# Patient Record
Sex: Male | Born: 1965
Health system: Southern US, Community
[De-identification: ages and names within clinical notes are randomized; demographics above are authoritative.]

## PROBLEM LIST (undated history)

## (undated) DIAGNOSIS — E785 Hyperlipidemia, unspecified: Secondary | ICD-10-CM

## (undated) DIAGNOSIS — R7303 Prediabetes: Secondary | ICD-10-CM

## (undated) DIAGNOSIS — F1120 Opioid dependence, uncomplicated: Secondary | ICD-10-CM

## (undated) DIAGNOSIS — E119 Type 2 diabetes mellitus without complications: Secondary | ICD-10-CM

## (undated) DIAGNOSIS — F329 Major depressive disorder, single episode, unspecified: Secondary | ICD-10-CM

## (undated) DIAGNOSIS — F32A Depression, unspecified: Secondary | ICD-10-CM

## (undated) DIAGNOSIS — G4730 Sleep apnea, unspecified: Secondary | ICD-10-CM

## (undated) DIAGNOSIS — G8929 Other chronic pain: Secondary | ICD-10-CM

## (undated) DIAGNOSIS — F419 Anxiety disorder, unspecified: Secondary | ICD-10-CM

## (undated) HISTORY — DX: Hyperlipidemia, unspecified: E78.5

## (undated) HISTORY — DX: Type 2 diabetes mellitus without complications: E11.9

---

## 1993-07-30 HISTORY — PX: OTHER SURGICAL HISTORY: SHX169

## 2004-05-22 ENCOUNTER — Encounter (INDEPENDENT_AMBULATORY_CARE_PROVIDER_SITE_OTHER): Payer: Self-pay | Admitting: *Deleted

## 2004-05-22 ENCOUNTER — Ambulatory Visit (HOSPITAL_COMMUNITY): Admission: RE | Admit: 2004-05-22 | Discharge: 2004-05-22 | Payer: Self-pay | Admitting: Gastroenterology

## 2004-11-01 ENCOUNTER — Ambulatory Visit: Payer: Self-pay | Admitting: Family Medicine

## 2004-11-14 ENCOUNTER — Ambulatory Visit: Payer: Self-pay | Admitting: Family Medicine

## 2006-12-04 ENCOUNTER — Ambulatory Visit: Payer: Self-pay | Admitting: *Deleted

## 2006-12-04 DIAGNOSIS — M25569 Pain in unspecified knee: Secondary | ICD-10-CM | POA: Insufficient documentation

## 2006-12-04 DIAGNOSIS — E785 Hyperlipidemia, unspecified: Secondary | ICD-10-CM | POA: Insufficient documentation

## 2006-12-04 DIAGNOSIS — M109 Gout, unspecified: Secondary | ICD-10-CM | POA: Insufficient documentation

## 2006-12-04 DIAGNOSIS — G4700 Insomnia, unspecified: Secondary | ICD-10-CM | POA: Insufficient documentation

## 2006-12-17 ENCOUNTER — Telehealth (INDEPENDENT_AMBULATORY_CARE_PROVIDER_SITE_OTHER): Payer: Self-pay | Admitting: *Deleted

## 2007-09-04 ENCOUNTER — Encounter (INDEPENDENT_AMBULATORY_CARE_PROVIDER_SITE_OTHER): Payer: Self-pay | Admitting: Internal Medicine

## 2007-09-05 ENCOUNTER — Ambulatory Visit: Payer: Self-pay | Admitting: Family Medicine

## 2007-09-05 LAB — CONVERTED CEMR LAB: Blood Glucose, Fingerstick: 293

## 2007-09-09 LAB — CONVERTED CEMR LAB
ALT: 23 units/L (ref 0–53)
Albumin: 4.4 g/dL (ref 3.5–5.2)
Bilirubin, Direct: 0.2 mg/dL (ref 0.0–0.3)
Calcium: 9.7 mg/dL (ref 8.4–10.5)
Creatinine,U: 225.2 mg/dL
GFR calc Af Amer: 158 mL/min
GFR calc non Af Amer: 131 mL/min
Glucose, Bld: 293 mg/dL — ABNORMAL HIGH (ref 70–99)
Hgb A1c MFr Bld: 10.7 % — ABNORMAL HIGH (ref 4.6–6.0)
Microalb Creat Ratio: 7.5 mg/g (ref 0.0–30.0)
Sodium: 136 meq/L (ref 135–145)
Total CHOL/HDL Ratio: 6.4
Triglycerides: 522 mg/dL (ref 0–149)
VLDL: 104 mg/dL — ABNORMAL HIGH (ref 0–40)

## 2007-09-22 ENCOUNTER — Ambulatory Visit: Payer: Self-pay | Admitting: Family Medicine

## 2007-09-25 ENCOUNTER — Telehealth (INDEPENDENT_AMBULATORY_CARE_PROVIDER_SITE_OTHER): Payer: Self-pay | Admitting: Internal Medicine

## 2008-01-01 ENCOUNTER — Encounter (INDEPENDENT_AMBULATORY_CARE_PROVIDER_SITE_OTHER): Payer: Self-pay | Admitting: Internal Medicine

## 2008-07-12 ENCOUNTER — Telehealth (INDEPENDENT_AMBULATORY_CARE_PROVIDER_SITE_OTHER): Payer: Self-pay | Admitting: Internal Medicine

## 2008-10-28 ENCOUNTER — Encounter (INDEPENDENT_AMBULATORY_CARE_PROVIDER_SITE_OTHER): Payer: Self-pay | Admitting: Internal Medicine

## 2008-12-30 ENCOUNTER — Ambulatory Visit: Payer: Self-pay | Admitting: Family Medicine

## 2008-12-30 DIAGNOSIS — F17200 Nicotine dependence, unspecified, uncomplicated: Secondary | ICD-10-CM | POA: Insufficient documentation

## 2009-01-05 ENCOUNTER — Encounter (INDEPENDENT_AMBULATORY_CARE_PROVIDER_SITE_OTHER): Payer: Self-pay | Admitting: Internal Medicine

## 2009-02-03 ENCOUNTER — Ambulatory Visit: Payer: Self-pay | Admitting: Family Medicine

## 2009-02-04 LAB — CONVERTED CEMR LAB
Cholesterol: 216 mg/dL — ABNORMAL HIGH (ref 0–200)
Total CHOL/HDL Ratio: 5
Triglycerides: 339 mg/dL — ABNORMAL HIGH (ref 0.0–149.0)
VLDL: 67.8 mg/dL — ABNORMAL HIGH (ref 0.0–40.0)

## 2009-02-08 ENCOUNTER — Ambulatory Visit: Payer: Self-pay | Admitting: Family Medicine

## 2009-03-11 ENCOUNTER — Ambulatory Visit: Payer: Self-pay | Admitting: Family Medicine

## 2009-03-21 ENCOUNTER — Encounter: Payer: Self-pay | Admitting: Family Medicine

## 2009-03-21 LAB — CONVERTED CEMR LAB
ALT: 35 units/L (ref 0–53)
AST: 21 units/L (ref 0–37)
Hgb A1c MFr Bld: 8 % — ABNORMAL HIGH (ref 4.6–6.5)
Total CHOL/HDL Ratio: 5
VLDL: 56.4 mg/dL — ABNORMAL HIGH (ref 0.0–40.0)

## 2009-03-23 ENCOUNTER — Ambulatory Visit: Payer: Self-pay | Admitting: Family Medicine

## 2009-03-23 DIAGNOSIS — F4321 Adjustment disorder with depressed mood: Secondary | ICD-10-CM | POA: Insufficient documentation

## 2009-03-30 ENCOUNTER — Telehealth (INDEPENDENT_AMBULATORY_CARE_PROVIDER_SITE_OTHER): Payer: Self-pay | Admitting: Internal Medicine

## 2009-03-30 ENCOUNTER — Ambulatory Visit: Payer: Self-pay | Admitting: Family Medicine

## 2009-03-30 DIAGNOSIS — R5383 Other fatigue: Secondary | ICD-10-CM | POA: Insufficient documentation

## 2009-04-06 ENCOUNTER — Telehealth (INDEPENDENT_AMBULATORY_CARE_PROVIDER_SITE_OTHER): Payer: Self-pay | Admitting: Internal Medicine

## 2009-04-06 ENCOUNTER — Encounter (INDEPENDENT_AMBULATORY_CARE_PROVIDER_SITE_OTHER): Payer: Self-pay | Admitting: Internal Medicine

## 2009-04-06 LAB — CONVERTED CEMR LAB
BUN: 15 mg/dL (ref 6–23)
Basophils Absolute: 0 10*3/uL (ref 0.0–0.1)
Calcium: 9.8 mg/dL (ref 8.4–10.5)
GFR calc non Af Amer: 85.92 mL/min (ref >60)
Glucose, Bld: 175 mg/dL — ABNORMAL HIGH (ref 70–99)
HCT: 38.8 % — ABNORMAL LOW (ref 39.0–52.0)
Lymphocytes Relative: 26.3 % (ref 12.0–46.0)
Lymphs Abs: 2.1 10*3/uL (ref 0.7–4.0)
Monocytes Relative: 5.6 % (ref 3.0–12.0)
Platelets: 244 10*3/uL (ref 150.0–400.0)
RDW: 11.5 % (ref 11.5–14.6)
TSH: 0.26 microintl units/mL — ABNORMAL LOW (ref 0.35–5.50)

## 2009-04-07 ENCOUNTER — Telehealth (INDEPENDENT_AMBULATORY_CARE_PROVIDER_SITE_OTHER): Payer: Self-pay | Admitting: Internal Medicine

## 2009-04-12 ENCOUNTER — Encounter: Admission: RE | Admit: 2009-04-12 | Discharge: 2009-04-12 | Payer: Self-pay | Admitting: Family Medicine

## 2009-04-13 ENCOUNTER — Telehealth (INDEPENDENT_AMBULATORY_CARE_PROVIDER_SITE_OTHER): Payer: Self-pay | Admitting: Internal Medicine

## 2009-04-13 ENCOUNTER — Encounter (INDEPENDENT_AMBULATORY_CARE_PROVIDER_SITE_OTHER): Payer: Self-pay | Admitting: Internal Medicine

## 2009-04-26 ENCOUNTER — Ambulatory Visit: Payer: Self-pay | Admitting: Family Medicine

## 2009-05-05 ENCOUNTER — Telehealth (INDEPENDENT_AMBULATORY_CARE_PROVIDER_SITE_OTHER): Payer: Self-pay | Admitting: Internal Medicine

## 2009-05-06 LAB — CONVERTED CEMR LAB
ALT: 33 units/L (ref 0–53)
Total CHOL/HDL Ratio: 6
Triglycerides: 192 mg/dL — ABNORMAL HIGH (ref 0.0–149.0)

## 2009-05-26 ENCOUNTER — Ambulatory Visit: Payer: Self-pay | Admitting: Family Medicine

## 2009-05-26 DIAGNOSIS — E7800 Pure hypercholesterolemia, unspecified: Secondary | ICD-10-CM | POA: Insufficient documentation

## 2009-05-27 ENCOUNTER — Telehealth (INDEPENDENT_AMBULATORY_CARE_PROVIDER_SITE_OTHER): Payer: Self-pay | Admitting: Internal Medicine

## 2009-05-27 LAB — CONVERTED CEMR LAB
Cholesterol: 155 mg/dL (ref 0–200)
Direct LDL: 88.6 mg/dL
Total CHOL/HDL Ratio: 4
Triglycerides: 244 mg/dL — ABNORMAL HIGH (ref 0.0–149.0)

## 2009-06-03 ENCOUNTER — Ambulatory Visit: Payer: Self-pay | Admitting: Family Medicine

## 2009-06-27 ENCOUNTER — Telehealth: Payer: Self-pay | Admitting: Family Medicine

## 2009-07-18 ENCOUNTER — Ambulatory Visit: Payer: Self-pay | Admitting: Family Medicine

## 2009-07-20 ENCOUNTER — Telehealth (INDEPENDENT_AMBULATORY_CARE_PROVIDER_SITE_OTHER): Payer: Self-pay | Admitting: Internal Medicine

## 2009-07-20 LAB — CONVERTED CEMR LAB
Cholesterol: 175 mg/dL (ref 0–200)
HDL: 35.1 mg/dL — ABNORMAL LOW (ref >39.00)
TSH: 1.34 microintl units/mL (ref 0.35–5.50)
Triglycerides: 209 mg/dL — ABNORMAL HIGH (ref 0.0–149.0)

## 2009-07-25 ENCOUNTER — Telehealth: Payer: Self-pay | Admitting: Family Medicine

## 2009-07-27 ENCOUNTER — Ambulatory Visit: Payer: Self-pay | Admitting: Family Medicine

## 2009-08-29 ENCOUNTER — Ambulatory Visit: Payer: Self-pay | Admitting: Family Medicine

## 2009-08-29 LAB — CONVERTED CEMR LAB
ALT: 46 units/L (ref 0–53)
AST: 27 units/L (ref 0–37)
LDL Cholesterol: 81 mg/dL (ref 0–99)
Total CHOL/HDL Ratio: 4
VLDL: 36.8 mg/dL (ref 0.0–40.0)

## 2009-09-21 ENCOUNTER — Telehealth: Payer: Self-pay | Admitting: Family Medicine

## 2009-10-10 ENCOUNTER — Ambulatory Visit: Payer: Self-pay | Admitting: Family Medicine

## 2009-11-03 ENCOUNTER — Ambulatory Visit: Payer: Self-pay | Admitting: Family Medicine

## 2009-11-03 DIAGNOSIS — B002 Herpesviral gingivostomatitis and pharyngotonsillitis: Secondary | ICD-10-CM | POA: Insufficient documentation

## 2009-11-03 DIAGNOSIS — B009 Herpesviral infection, unspecified: Secondary | ICD-10-CM

## 2009-11-07 ENCOUNTER — Ambulatory Visit: Payer: Self-pay | Admitting: Family Medicine

## 2009-11-07 LAB — CONVERTED CEMR LAB
Basophils Absolute: 0.1 10*3/uL (ref 0.0–0.1)
Eosinophils Absolute: 0.3 10*3/uL (ref 0.0–0.7)
LH: 7.29 milliintl units/mL (ref 1.50–9.30)
Lymphocytes Relative: 22.1 % (ref 12.0–46.0)
MCHC: 34.5 g/dL (ref 30.0–36.0)
Monocytes Absolute: 0.5 10*3/uL (ref 0.1–1.0)
Neutrophils Relative %: 65.6 % (ref 43.0–77.0)
PSA: 0.55 ng/mL (ref 0.10–4.00)
RDW: 13.4 % (ref 11.5–14.6)

## 2009-12-05 ENCOUNTER — Ambulatory Visit: Payer: Self-pay | Admitting: Family Medicine

## 2009-12-07 ENCOUNTER — Telehealth: Payer: Self-pay | Admitting: Family Medicine

## 2009-12-21 ENCOUNTER — Telehealth: Payer: Self-pay | Admitting: Family Medicine

## 2009-12-29 ENCOUNTER — Telehealth: Payer: Self-pay | Admitting: Family Medicine

## 2010-01-03 ENCOUNTER — Telehealth: Payer: Self-pay | Admitting: Family Medicine

## 2010-01-17 ENCOUNTER — Ambulatory Visit: Payer: Self-pay | Admitting: Family Medicine

## 2010-01-17 DIAGNOSIS — E039 Hypothyroidism, unspecified: Secondary | ICD-10-CM | POA: Insufficient documentation

## 2010-01-17 DIAGNOSIS — E119 Type 2 diabetes mellitus without complications: Secondary | ICD-10-CM | POA: Insufficient documentation

## 2010-01-17 LAB — CONVERTED CEMR LAB: Microalb Creat Ratio: 0.6 mg/g (ref 0.0–30.0)

## 2010-01-18 LAB — CONVERTED CEMR LAB
ALT: 34 units/L (ref 0–53)
AST: 34 units/L (ref 0–37)
Basophils Absolute: 0 10*3/uL (ref 0.0–0.1)
Direct LDL: 89.2 mg/dL
Eosinophils Relative: 3.2 % (ref 0.0–5.0)
HCT: 39.5 % (ref 39.0–52.0)
Hemoglobin: 13.5 g/dL (ref 13.0–17.0)
Hgb A1c MFr Bld: 6.9 % — ABNORMAL HIGH (ref 4.6–6.5)
Lymphocytes Relative: 23.9 % (ref 12.0–46.0)
Lymphs Abs: 1.8 10*3/uL (ref 0.7–4.0)
Monocytes Relative: 6.2 % (ref 3.0–12.0)
Platelets: 258 10*3/uL (ref 150.0–400.0)
TSH: 0.58 microintl units/mL (ref 0.35–5.50)
Testosterone: 689.36 ng/dL (ref 350.00–890.00)
VLDL: 55.8 mg/dL — ABNORMAL HIGH (ref 0.0–40.0)
WBC: 7.3 10*3/uL (ref 4.5–10.5)

## 2010-01-25 ENCOUNTER — Ambulatory Visit: Payer: Self-pay | Admitting: Family Medicine

## 2010-01-31 ENCOUNTER — Telehealth: Payer: Self-pay | Admitting: Family Medicine

## 2010-02-15 ENCOUNTER — Telehealth: Payer: Self-pay | Admitting: Family Medicine

## 2010-02-20 ENCOUNTER — Telehealth: Payer: Self-pay | Admitting: Family Medicine

## 2010-02-20 ENCOUNTER — Ambulatory Visit: Payer: Self-pay | Admitting: Family Medicine

## 2010-02-20 DIAGNOSIS — M79609 Pain in unspecified limb: Secondary | ICD-10-CM | POA: Insufficient documentation

## 2010-02-21 ENCOUNTER — Encounter: Payer: Self-pay | Admitting: Family Medicine

## 2010-03-10 ENCOUNTER — Telehealth: Payer: Self-pay | Admitting: Family Medicine

## 2010-03-30 ENCOUNTER — Telehealth: Payer: Self-pay | Admitting: Family Medicine

## 2010-04-18 ENCOUNTER — Ambulatory Visit: Payer: Self-pay | Admitting: Family Medicine

## 2010-04-18 DIAGNOSIS — R0602 Shortness of breath: Secondary | ICD-10-CM | POA: Insufficient documentation

## 2010-04-19 ENCOUNTER — Telehealth: Payer: Self-pay | Admitting: Family Medicine

## 2010-04-20 ENCOUNTER — Encounter: Payer: Self-pay | Admitting: Family Medicine

## 2010-04-20 LAB — CONVERTED CEMR LAB
BUN: 16 mg/dL (ref 6–23)
Basophils Absolute: 0 10*3/uL (ref 0.0–0.1)
Basophils Relative: 0.8 % (ref 0.0–3.0)
Calcium: 9.7 mg/dL (ref 8.4–10.5)
Chloride: 102 meq/L (ref 96–112)
Creatinine, Ser: 0.9 mg/dL (ref 0.4–1.5)
Eosinophils Absolute: 0.2 10*3/uL (ref 0.0–0.7)
HCT: 35 % — ABNORMAL LOW (ref 39.0–52.0)
Hemoglobin: 12 g/dL — ABNORMAL LOW (ref 13.0–17.0)
Lymphs Abs: 1.7 10*3/uL (ref 0.7–4.0)
MCHC: 34.3 g/dL (ref 30.0–36.0)
MCV: 94.3 fL (ref 78.0–100.0)
Monocytes Absolute: 0.5 10*3/uL (ref 0.1–1.0)
Neutro Abs: 3.6 10*3/uL (ref 1.4–7.7)
RBC: 3.71 M/uL — ABNORMAL LOW (ref 4.22–5.81)
RDW: 13.6 % (ref 11.5–14.6)
TSH: 0.58 microintl units/mL (ref 0.35–5.50)

## 2010-04-24 ENCOUNTER — Ambulatory Visit: Payer: Self-pay | Admitting: Family Medicine

## 2010-04-25 LAB — CONVERTED CEMR LAB
BUN: 18 mg/dL (ref 6–23)
Basophils Relative: 1 % (ref 0.0–3.0)
CO2: 30 meq/L (ref 19–32)
Chloride: 104 meq/L (ref 96–112)
Cholesterol: 156 mg/dL (ref 0–200)
Creatinine, Ser: 0.8 mg/dL (ref 0.4–1.5)
Eosinophils Absolute: 0.2 10*3/uL (ref 0.0–0.7)
Eosinophils Relative: 2.9 % (ref 0.0–5.0)
HCT: 37.7 % — ABNORMAL LOW (ref 39.0–52.0)
Lymphs Abs: 1.5 10*3/uL (ref 0.7–4.0)
MCHC: 34.1 g/dL (ref 30.0–36.0)
MCV: 94.8 fL (ref 78.0–100.0)
Monocytes Absolute: 0.4 10*3/uL (ref 0.1–1.0)
Platelets: 235 10*3/uL (ref 150.0–400.0)
Potassium: 5 meq/L (ref 3.5–5.1)
RBC: 3.97 M/uL — ABNORMAL LOW (ref 4.22–5.81)
VLDL: 70.2 mg/dL — ABNORMAL HIGH (ref 0.0–40.0)
WBC: 5.7 10*3/uL (ref 4.5–10.5)

## 2010-05-01 ENCOUNTER — Ambulatory Visit: Payer: Self-pay | Admitting: Family Medicine

## 2010-05-08 ENCOUNTER — Telehealth: Payer: Self-pay | Admitting: Family Medicine

## 2010-05-16 ENCOUNTER — Telehealth: Payer: Self-pay | Admitting: Family Medicine

## 2010-05-25 ENCOUNTER — Telehealth: Payer: Self-pay | Admitting: Family Medicine

## 2010-06-08 ENCOUNTER — Telehealth: Payer: Self-pay | Admitting: Family Medicine

## 2010-06-13 ENCOUNTER — Telehealth: Payer: Self-pay | Admitting: Family Medicine

## 2010-06-14 ENCOUNTER — Telehealth: Payer: Self-pay | Admitting: Family Medicine

## 2010-06-16 ENCOUNTER — Telehealth: Payer: Self-pay | Admitting: Family Medicine

## 2010-06-16 ENCOUNTER — Ambulatory Visit: Payer: Self-pay | Admitting: Family Medicine

## 2010-06-19 LAB — CONVERTED CEMR LAB
Alkaline Phosphatase: 37 units/L — ABNORMAL LOW (ref 39–117)
Bilirubin, Direct: 0.1 mg/dL (ref 0.0–0.3)
LDL Cholesterol: 71 mg/dL (ref 0–99)
Total Bilirubin: 0.8 mg/dL (ref 0.3–1.2)
VLDL: 38.4 mg/dL (ref 0.0–40.0)

## 2010-06-26 ENCOUNTER — Ambulatory Visit: Payer: Self-pay | Admitting: Family Medicine

## 2010-06-28 ENCOUNTER — Telehealth: Payer: Self-pay | Admitting: Family Medicine

## 2010-06-29 ENCOUNTER — Telehealth: Payer: Self-pay | Admitting: Family Medicine

## 2010-07-11 ENCOUNTER — Telehealth: Payer: Self-pay | Admitting: Family Medicine

## 2010-07-19 ENCOUNTER — Telehealth: Payer: Self-pay | Admitting: Family Medicine

## 2010-07-26 ENCOUNTER — Ambulatory Visit: Payer: Self-pay | Admitting: Family Medicine

## 2010-07-26 DIAGNOSIS — J069 Acute upper respiratory infection, unspecified: Secondary | ICD-10-CM | POA: Insufficient documentation

## 2010-07-26 DIAGNOSIS — F411 Generalized anxiety disorder: Secondary | ICD-10-CM | POA: Insufficient documentation

## 2010-08-07 ENCOUNTER — Telehealth: Payer: Self-pay | Admitting: Family Medicine

## 2010-08-11 ENCOUNTER — Telehealth: Payer: Self-pay | Admitting: Family Medicine

## 2010-08-11 ENCOUNTER — Ambulatory Visit
Admission: RE | Admit: 2010-08-11 | Discharge: 2010-08-11 | Payer: Self-pay | Source: Home / Self Care | Attending: Family Medicine | Admitting: Family Medicine

## 2010-08-11 DIAGNOSIS — H9209 Otalgia, unspecified ear: Secondary | ICD-10-CM | POA: Insufficient documentation

## 2010-08-29 NOTE — Progress Notes (Signed)
Summary: Valcyclovir  Phone Note Refill Request Message from:  [employer] on Dec 07, 2009 9:29 AM  Refills Requested: Medication #1:  VALTREX 1 GM TABS 2 tabs by mouth two times a day x 1 days [facility] [id number]   Last Fill Date:  No date sent   Pharmacy Phone:  [phone]   Method Requested: Electronic Initial call taken by: Delilah Shan CMA Duncan Dull),  Dec 07, 2009 9:29 AM    Prescriptions: VALTREX 1 GM TABS (VALACYCLOVIR HCL) 2 tabs by mouth two times a day x 1 days  #20 x 0   Entered and Authorized by:   Ruthe Mannan MD   Signed by:   Ruthe Mannan MD on 12/07/2009   Method used:   Electronically to        [facility] (retail)       [address]       [place], [place]  [address]       Ph: [phone] or [phone]       Fax: [phone]   RxID:   [id number]

## 2010-08-29 NOTE — Assessment & Plan Note (Signed)
Summary: Discuss med options /lsf   Vital Signs:  Patient profile:   45 year old male Height:      70 inches Weight:      208 pounds BMI:     29.95 Temp:     99 degrees F oral Pulse rate:   84 / minute Pulse rhythm:   regular BP sitting:   138 / 88  (left arm) Cuff size:   large  Vitals Entered By: Delilah Shan CMA Duncan Dull) (November 07, 2009 8:16 AM) CC: Discuss med options   History of Present Illness: 45 yo here to discuss low testosterone.  Saw him last week for worsening symptoms of depression. Started having symptoms of depression after he was laid off in 2009. Was placed on Prozac, titrated up to 30 mg last year, then Wellbutrin 300 mg qam was added in November 2010.  Last week, we decided to wean off of prozac and start Cymbalta 60 mg daily in the hopes that maybe it would help with his chronic pain.  We also checked his total testosterone levels because of lack of sex drive.   Has not noticed any decrease in size of his testicles. No h/o prostate CA.  No peripheral vision abnormalities. Total testosterone 215!   Current Medications (verified): 1)  Vitamin C 500 Mg  Tabs (Ascorbic Acid) .... One Every Other Day 2)  Vitamin B-12 500 Mcg  Tabs (Cyanocobalamin) .... One Every Other Day 3)  Multivitamins   Tabs (Multiple Vitamin) .... One Every Otherday 4)  Aspirin 81 Mg  Tabs (Aspirin) .... One Daily 5)  Mineral Complex  Caps (Multiple Minerals) .... One Every Other Day 6)  Potassium 99 Mg Tabs (Potassium) .... One Every Other Day 7)  Magnesium Oxide 500 Mg Tabs (Magnesium Oxide) .... One Daily 8)  Fish Oil   Oil (Fish Oil) .... One Every Other Day 9)  L-Tyrosine 500 Mg Caps (Tyrosine) .... One Every Other Day 10)  Calcium 1250 Mg Tabs (Calcium Carbonate) .... One Every Other Day 11)  Vitamin B Complex-C   Caps (B Complex-C) .... One Every Other Day 12)  Actoplus Met 15-850 Mg Tabs (Pioglitazone Hcl-Metformin Hcl) .... Take 1 One  Times A Day 13)  Lipitor 40 Mg Tabs  (Atorvastatin Calcium) .Marland Kitchen.. 1 Once Daily For Cholesterol 14)  Fenofibrate Micronized 134 Mg Caps (Fenofibrate Micronized) .Marland Kitchen.. 1 Once Daily For Triglycerides 15)  Lipitor 40 Mg Tabs (Atorvastatin Calcium) .Marland Kitchen.. 1 Daily For Cholesterol 16)  Wellbutrin Sr 150 Mg Xr12h-Tab (Bupropion Hcl) .... 2 Tab By Mouth in The Morning, 1 Tab By Mouth Qhs 17)  Ferrous Sulfate 325 (65 Fe) Mg Tabs (Ferrous Sulfate) .... Take 1 Tablet By Mouth Once A Day 18)  Cymbalta 60 Mg Cpep (Duloxetine Hcl) .... Take One Capsule Daily 19)  Valtrex 1 Gm Tabs (Valacyclovir Hcl) .... 2 Tabs By Mouth Two Times A Day X 1 Days 20)  Androgel Pump 1 % Gel (Testosterone) .... 5 G Every Morning Applied To Clean, Dry, Intact Skin.  Allergies: 1)  ! Lopid  Review of Systems      See HPI  Physical Exam  General:  alert, well-developed, well-nourished, and well-hydrated.   Psych:  normally interactive, good eye contact, not anxious appearing, and not depressed appearing.     Impression & Recommendations:  Problem # 1:  TESTICULAR HYPOFUNCTION (ICD-257.2) Assessment New Time spent with patient 25 minutes, more than 50% of this time was spent counseling patient on causes and treatment of  low testosterone. His testosterone is very low for his age.  Would like to truly figure out that cause.  May be multifactorial-long term use of SSRIs and Tramadol at high doses, obesity (BMI 30), and diabetes that has been poorly controlled at times. Will check FSH, LH to distinguish between primary and secondary hypogonadism. Start testosterone supplementation today- Adrogel 5 g daily. Check baseline CBC, PSA today. Follow up with me in 3 months.  Orders: Venipuncture ([address]) TLB-CBC Platelet - w/Differential (85025-CBCD) TLB-FSH (Follicle Stimulating Hormone) (83001-FSH) TLB-Luteinizing Hormone (LH) (83002-LH) TLB-PSA (Prostate Specific Antigen) (84153-PSA)  Complete Medication List: 1)  Vitamin C 500 Mg Tabs (Ascorbic acid) .... One  every other day 2)  Vitamin B-12 500 Mcg Tabs (Cyanocobalamin) .... One every other day 3)  Multivitamins Tabs (Multiple vitamin) .... One every otherday 4)  Aspirin 81 Mg Tabs (Aspirin) .... One daily 5)  Mineral Complex Caps (Multiple minerals) .... One every other day 6)  Potassium 99 Mg Tabs (Potassium) .... One every other day 7)  Magnesium Oxide 500 Mg Tabs (Magnesium oxide) .... One daily 8)  Fish Oil Oil (Fish oil) .... One every other day 9)  L-tyrosine 500 Mg Caps (Tyrosine) .... One every other day 10)  Calcium 1250 Mg Tabs (Calcium carbonate) .... One every other day 11)  Vitamin B Complex-c Caps (B complex-c) .... One every other day 12)  Actoplus Met 15-850 Mg Tabs (Pioglitazone hcl-metformin hcl) .... Take 1 one  times a day 13)  Lipitor 40 Mg Tabs (Atorvastatin calcium) .Marland Kitchen.. 1 once daily for cholesterol 14)  Fenofibrate Micronized 134 Mg Caps (Fenofibrate micronized) .Marland Kitchen.. 1 once daily for triglycerides 15)  Lipitor 40 Mg Tabs (Atorvastatin calcium) .Marland Kitchen.. 1 daily for cholesterol 16)  Wellbutrin Sr 150 Mg Xr12h-tab (Bupropion hcl) .... 2 tab by mouth in the morning, 1 tab by mouth qhs 17)  Ferrous Sulfate 325 (65 Fe) Mg Tabs (Ferrous sulfate) .... Take 1 tablet by mouth once a day 18)  Cymbalta 60 Mg Cpep (Duloxetine hcl) .... Take one capsule daily 19)  Valtrex 1 Gm Tabs (Valacyclovir hcl) .... 2 tabs by mouth two times a day x 1 days 20)  Androgel Pump 1 % Gel (Testosterone) .... 5 g every morning applied to clean, dry, intact skin.  Patient Instructions: 1)  Please come back to see me in 3 months. Prescriptions: ANDROGEL PUMP 1 % GEL (TESTOSTERONE) 5 g every morning applied to clean, dry, intact skin.  #1 bottle x 6   Entered and Authorized by:   Ruthe Mannan MD   Signed by:   Ruthe Mannan MD on 11/07/2009   Method used:   Print then Give to Patient   RxID:   [id number]   Current Allergies (reviewed today): ! LOPID

## 2010-08-29 NOTE — Assessment & Plan Note (Signed)
Summary: SHORTNESS OF BREATH/JRR   Vital Signs:  Patient profile:   45 year old male Height:      70 inches Weight:      206 pounds BMI:     29.66 O2 Sat:      100 % on Room air Temp:     98.4 degrees F oral Pulse rate:   72 / minute Pulse rhythm:   regular BP sitting:   110 / 70  (right arm) Cuff size:   regular  Vitals Entered By: Linde Gillis CMA Duncan Dull) (April 18, 2010 10:38 AM)  O2 Flow:  Room air CC: shortness of breath   History of Present Illness: 45 yo here for SOB.  Started about 3 weeks ago.  No cough, wheezing, URI symptoms. Has noticed that he will fall asleep and then wake up needing to take a deep breath.  Sits in the recliner to fall back alseep.  No LE edema or cardiac history.   has always been someone who snores.  Has had h/o hypothyroidism, testicular hypfunction on Androderm. Has had issues with anxiety and depression in past but lately he feels he has been doing ok. Always has stress, but not more than usual.    Occassionaly SOB in the middle of day but mostly at night. No CP, no DOE.  Current Medications (verified): 1)  Multivitamins   Tabs (Multiple Vitamin) .... One Every Otherday 2)  Aspirin 81 Mg  Tabs (Aspirin) .... One Daily 3)  Magnesium Oxide 500 Mg Tabs (Magnesium Oxide) .... One Daily 4)  Fish Oil   Oil (Fish Oil) .... One Every Other Day 5)  Actoplus Met 15-850 Mg Tabs (Pioglitazone Hcl-Metformin Hcl) .... Take 1 One  Times A Day 6)  Lipitor 40 Mg Tabs (Atorvastatin Calcium) .Marland Kitchen.. 1 Once Daily For Cholesterol 7)  Fenofibrate Micronized 134 Mg Caps (Fenofibrate Micronized) .Marland Kitchen.. 1 Once Daily For Triglycerides 8)  Ferrous Sulfate 325 (65 Fe) Mg Tabs (Ferrous Sulfate) .... Take 1 Tablet By Mouth Once A Day 9)  Vicodin Es 7.5-750 Mg Tabs (Hydrocodone-Acetaminophen) .Marland Kitchen.. 1-2 Tabs By Mouth Two Times A Day As Needed Pain 10)  Androderm 2.5 Mg/24hr Pt24 (Testosterone) .Marland Kitchen.. 1 Patch Applied Daily. 11)  Calcium 600 Mg Tabs (Calcium) .... Take One  Tablet By Mouth Two Times A Day 12)  Valacyclovir Hcl 1 Gm Tabs (Valacyclovir Hcl) .... Take 2 Tablets By Mouth Two Times A Day X 1 Day 13)  Colchicine 0.6 Mg Tabs (Colchicine) .... 2 Tabs Po At  First Sign of Flare, Followed in 1 Hour With A Single Dose 1 Tab Po 14)  Alprazolam 0.25 Mg Tabs (Alprazolam) .Marland Kitchen.. 1-2 Tab By Mouth Three Times A Day As Needed Anxiey or Shortness of Breath  Allergies: 1)  ! Lopid  Past History:  Past Medical History: Last updated: 12/04/2006 Hyperlipidemia  Past Surgical History: Last updated: 12/04/2006 Removal of bone spurs from both elbows- 1995  Family History: Last updated: 09/05/2007 Family History of Cardiovascular disorder Both parents still living and also 3 siblings as well. 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 Eyes:  Denies blurring. CV:  Denies chest pain or discomfort. Resp:  Complains of shortness of breath; denies cough, sputum productive, and wheezing. Psych:  Complains of anxiety; denies mental problems, panic attacks, sense of great danger, suicidal thoughts/plans, thoughts of violence, unusual visions or sounds, and thoughts /plans of harming others.  Physical Exam  General:  alert, well-developed, well-nourished, and well-hydrated.   Lungs:  Normal respiratory effort, chest expands symmetrically. Lungs are clear to auscultation, no crackles or wheezes. Heart:  Normal rate and regular rhythm. S1 and S2 normal without gallop, murmur, click, rub or other extra sounds. Extremities:  no edema Neurologic:  alert & oriented X3 and  gait normal.   Skin:  Intact without suspicious lesions or rashes Psych:  slightly anxious, otherwise normal affect.Oriented X3 and normally interactive.     Impression & Recommendations:  Problem # 1:  SHORTNESS OF BREATH (ICD-786.05) Assessment New Unknown etiology.  Likely multifactorial, combination of anxiety and ?sleep apnea. GIven his history of anxiety and depression, will give as needed Alprazolam to see if helps with symptoms but given nocturnal complaints and wanting to sleep in a recliner, a sleep study is very appropriate. Also will check CBC, TSH given h/o testosterone use and hypothyroidism.  Complete Medication List: 1)  Multivitamins Tabs (Multiple vitamin) .... One every otherday 2)  Aspirin 81 Mg Tabs (Aspirin) .... One daily 3)  Magnesium Oxide 500 Mg Tabs (Magnesium oxide) .... One daily 4)  Fish Oil Oil (Fish oil) .... One every other day 5)  Actoplus Met 15-850 Mg Tabs (Pioglitazone hcl-metformin hcl) .... Take 1 one  times a day 6)  Lipitor 40 Mg Tabs (Atorvastatin calcium) .Marland Kitchen.. 1 once daily for cholesterol 7)  Fenofibrate Micronized 134 Mg Caps (Fenofibrate micronized) .Marland Kitchen.. 1 once daily for triglycerides 8)  Ferrous Sulfate 325 (65 Fe) Mg Tabs (Ferrous sulfate) .... Take 1 tablet by mouth once a day 9)  Vicodin Es 7.5-750 Mg Tabs (Hydrocodone-acetaminophen) .Marland Kitchen.. 1-2 tabs by mouth two times a day as needed pain 10)  Androderm 2.5 Mg/24hr Pt24 (Testosterone) .Marland Kitchen.. 1 patch applied daily. 11)  Calcium 600 Mg Tabs (Calcium) .... Take one tablet by mouth two times a day 12)  Valacyclovir Hcl 1 Gm Tabs (Valacyclovir hcl) .... Take 2 tablets by mouth two times a day x 1 day 13)  Colchicine 0.6 Mg Tabs (Colchicine) .... 2 tabs po at  first sign of flare, followed in 1 hour with a single dose 1 tab po 14)  Alprazolam 0.25 Mg Tabs (Alprazolam) .Marland Kitchen.. 1-2 tab by mouth three times a day as needed anxiey or shortness of breath  Other Orders: Venipuncture (29562) TLB-CBC  Platelet - w/Differential (85025-CBCD) TLB-BMP (Basic Metabolic Panel-BMET) (80048-METABOL) TLB-TSH (Thyroid Stimulating Hormone) (84443-TSH) Sleep Disorder Referral (Sleep Disorder)  Patient Instructions: 1)  Please stop by to see Aram Beecham on your way out to set up your sleep study. Prescriptions: ALPRAZOLAM 0.25 MG TABS (ALPRAZOLAM) 1-2 tab by mouth three times a day as needed anxiey or shortness of breath  #60 x 0   Entered and Authorized by:   Ruthe Mannan MD   Signed by:   Ruthe Mannan MD on 04/18/2010   Method used:   Print then Give to Patient   RxID:   [id number]   Current Allergies (reviewed today): ! LOPID

## 2010-08-29 NOTE — Progress Notes (Signed)
Summary: Rx Hydrocodone/APAP  Phone Note Refill Request Call back at [phone] Message [facility]:  [facility] on January 31, 2010 10:12 AM  Refills Requested: Medication #1:  VICODIN ES 7.5-750 MG TABS 1-2 tabs by mouth two times a day as needed pain   Last Refilled: 01/11/2010 Received faxed refill request please advise.   Method Requested: Telephone to Pharmacy Initial call taken by: Linde Gillis CMA Duncan Dull),  January 31, 2010 10:12 AM  Follow-up for Phone Call        Rx called to pharmacy Follow-up by: Linde Gillis CMA Duncan Dull),  January 31, 2010 10:35 AM    Prescriptions: VICODIN ES 7.5-750 MG TABS (HYDROCODONE-ACETAMINOPHEN) 1-2 tabs by mouth two times a day as needed pain  #90 x 0   Entered and Authorized by:   Ruthe Mannan MD   Signed by:   Ruthe Mannan MD on 01/31/2010   Method used:   Telephoned to ...       [facility] [address] ([facility]ail)       [address]       [place], [place]  [address]       Ph: [phone] or [phone]       Fax: [phone]   RxID:   [id number]

## 2010-08-29 NOTE — Progress Notes (Signed)
Summary: Rx Colcrys  Phone Note Refill Request Call back at [phone] Message from:  [facility] on March 10, 2010 10:57 AM  Refills Requested: Medication #1:  COLCHICINE 0.6 MG TABS 2 tabs po at  first sign of flare Received E-script request please advise.   Method Requested: Electronic Initial call taken by: Linde Gillis CMA Duncan Dull),  March 10, 2010 10:58 AM    Prescriptions: COLCHICINE 0.6 MG TABS (COLCHICINE) 2 tabs po at  first sign of flare, followed in 1 hour with a single dose 1 tab po  #30 x 3   Entered and Authorized by:   Ruthe Mannan MD   Signed by:   Ruthe Mannan MD on 03/10/2010   Method used:   Electronically to        [facility] (retail)       [address]       [place], [place]  [address]       Ph: [phone] or [phone]       Fax: [phone]   RxID:   [id number]

## 2010-08-29 NOTE — Assessment & Plan Note (Signed)
Summary: 8 WK F/U DLO   Vital Signs:  Patient profile:   45 year old male Height:      70 inches Weight:      204 pounds BMI:     29.38 Temp:     98.5 degrees F oral Pulse rate:   72 / minute Pulse rhythm:   regular BP sitting:   144 / 82  (left arm) Cuff size:   regular  Vitals Entered By: Linde Gillis CMA Duncan Dull) (June 26, 2010 3:39 PM) CC: 8 week follow up after labs   History of Present Illness: 45 yo here to follow up cholesterol, diabetes.    HLD- mcuh improved with Crestor 10 mg daily.  TG now 192 (was 351), LDL stable at 81 ( was 74.6)    Compliant with medications and denies any side effects.  DM- a1c stable at 7.0.  Does not check his CBGs regularly.   On Actoplus Met 15-850.    Chronic pain- well controlled on Vicodin, does not abuse his pain contract.  Brings his bottle in today that says Vicodin 5-500- 1 tab by mouth three times a day  as needed pain instead of 1-2 tab three times a day as needed pain.  Allergies: 1)  ! Lopid  Review of Systems      See HPI General:  Denies malaise. CV:  Denies chest pain or discomfort. Resp:  Denies shortness of breath. MS:  Denies muscle aches, muscle weakness, and stiffness.  Physical Exam  General:  alert, well-developed, well-nourished, and well-hydrated.   Mouth:  MMM Lungs:  Normal respiratory effort, chest expands symmetrically. Lungs are clear to auscultation, no crackles or wheezes. Heart:  Normal rate and regular rhythm. S1 and S2 normal without gallop, murmur, click, rub or other extra sounds. Psych:  slightly anxious, otherwise normal affect.Oriented X3 and normally interactive.     Impression & Recommendations:  Problem # 1:  DIABETES-TYPE 2 (ICD-250.00) Assessment Deteriorated Discussed labs with him.  He will work on diet, no change in meds. Recheck a1c in 3 months. His updated medication list for this problem includes:    Aspirin 81 Mg Tabs (Aspirin) ..... One daily    Actoplus Met 15-850 Mg  Tabs (Pioglitazone hcl-metformin hcl) .Marland Kitchen... Take 1 one  times a day  Problem # 2:  PURE HYPERCHOLESTEROLEMIA (ICD-272.0) Assessment: Improved Continue Crestor and Fenofibrate.  No apparent side effects. His updated medication list for this problem includes:    Fenofibrate Micronized 134 Mg Caps (Fenofibrate micronized) .Marland Kitchen... 1 once daily for triglycerides    Crestor 10 Mg Tabs (Rosuvastatin calcium) .Marland Kitchen... 1 tablet by mouth daily  Complete Medication List: 1)  Multivitamins Tabs (Multiple vitamin) .... One every otherday 2)  Aspirin 81 Mg Tabs (Aspirin) .... One daily 3)  Magnesium Oxide 500 Mg Tabs (Magnesium oxide) .... One daily 4)  Fish Oil Oil (Fish oil) .... One every other day 5)  Actoplus Met 15-850 Mg Tabs (Pioglitazone hcl-metformin hcl) .... Take 1 one  times a day 6)  Fenofibrate Micronized 134 Mg Caps (Fenofibrate micronized) .Marland Kitchen.. 1 once daily for triglycerides 7)  Ferrous Sulfate 325 (65 Fe) Mg Tabs (Ferrous sulfate) .... Take 1 tablet by mouth once a day 8)  Vicodin 5-500 Mg Tabs (Hydrocodone-acetaminophen) .Marland Kitchen.. 1-2  tab by mouth three times a day as needed pain 9)  Androderm 2.5 Mg/24hr Pt24 (Testosterone) .Marland Kitchen.. 1 patch applied daily. 10)  Calcium 600 Mg Tabs (Calcium) .... Take one tablet by mouth two times  a day 11)  Valacyclovir Hcl 1 Gm Tabs (Valacyclovir hcl) .... Take 2 tablets by mouth two times a day x 1 day 12)  Colchicine 0.6 Mg Tabs (Colchicine) .... 2 tabs po at  first sign of flare, followed in 1 hour with a single dose 1 tab po 13)  Alprazolam 0.25 Mg Tabs (Alprazolam) .Marland Kitchen.. 1-2 tab by mouth three times a day as needed anxiey or shortness of breath 14)  Crestor 10 Mg Tabs (Rosuvastatin calcium) .Marland Kitchen.. 1 tablet by mouth daily  Patient Instructions: 1)  Have a wonderful holiday. 2)  Make a lab appointment to check you a1c in 3 months (250.00) Prescriptions: VICODIN 5-500 MG TABS (HYDROCODONE-ACETAMINOPHEN) 1-2  tab by mouth three times a day as needed pain  #280 x  0   Entered and Authorized by:   Ruthe Mannan MD   Signed by:   Ruthe Mannan MD on 06/26/2010   Method used:   Print then Give to Patient   RxID:   [id number] VICODIN 5-500 MG TABS (HYDROCODONE-ACETAMINOPHEN) 1-2  tab by mouth three times a day as needed pain  #180 x 0   Entered and Authorized by:   Ruthe Mannan MD   Signed by:   Ruthe Mannan MD on 06/26/2010   Method used:   Print then Give to Patient   RxID:   [id number]    Orders Added: 1)  Est. Patient Level IV [84696]    Current Allergies (reviewed today): ! LOPID

## 2010-08-29 NOTE — Progress Notes (Signed)
Summary: refill request for vicodin  Phone Note Refill Request Message from:  Fax from Pharmacy  Refills Requested: Medication #1:  VICODIN ES 7.5-750 MG TABS 1-2 tabs by mouth two times a day as needed pain   Last Refilled: 05/01/2010 [facility] [address], [phone].  Request is for 5/500, dose in chart is 7.5/750.  Initial call taken by: Lowella Petties CMA,  May 16, 2010 4:59 PM  Follow-up for Phone Call        Rx called to pharmacy Follow-up by: Linde Gillis CMA Duncan Dull),  May 17, 2010 9:04 AM    Prescriptions: VICODIN ES 7.5-750 MG TABS (HYDROCODONE-ACETAMINOPHEN) 1-2 tabs by mouth two times a day as needed pain  #120 x 0   Entered and Authorized by:   Ruthe Mannan MD   Signed by:   Ruthe Mannan MD on 05/17/2010   Method used:   Telephoned to ...       [facility]ail)       [address]       [place], [place]  [address]       Ph: [phone] or [phone]       Fax: [phone]   RxID:   [id number]

## 2010-08-29 NOTE — Assessment & Plan Note (Signed)
Summary: ? increase Prozac.  Appt. to Est in June/lsf   Vital Signs:  Patient profile:   45 year old male Height:      70 inches Weight:      209.8 pounds BMI:     30.21 Temp:     98.7 degrees F oral Pulse rate:   80 / minute Pulse rhythm:   regular BP sitting:   120 / 84  (left arm) Cuff size:   large  Vitals Entered By: Benny Lennert CMA Duncan Dull) (October 10, 2009 8:18 AM)  History of Present Illness: 45 yo here to discuss worsening symptoms of depression. Started having symptoms of depression after he was laid off in 2009. 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 Has a 59 yo son and a daughter at [place], feels a lot of pressure to take care of them financially.  laced on Prozac, titrated up to 40 mg last year, then Wellbutrin 300 mg qam was added in November 2010. Symptoms of tearfulness and hopelessness got a little better but over past few months, feels worsening anhedonia.  No SI or HI. Titrated up his Wellbutrin- taking 600 mg a day now!  Having difficulty sleeping, feeling anxious. No panic attacks. Lack of interest in sexual intercourse. Has chronic pain from work injuries and arthritis.  Takes 100 mg of tramadol every 6 hours, helps with the pain but still having breakthrough pain.  Current Medications (verified): 1)  Vitamin C 500 Mg  Tabs (Ascorbic Acid) .... One Every Other Day 2)  Vitamin B-12 500 Mcg  Tabs (Cyanocobalamin) .... One Every Other Day 3)  Multivitamins   Tabs (Multiple Vitamin) .... One Every Otherday 4)  Trilipix 135 Mg Cpdr (Choline Fenofibrate) .Marland Kitchen.. 1 Once Daily For Elevated Triglycerides 5)  Aspirin 81 Mg  Tabs (Aspirin) .... One Daily 6)  Mineral Complex  Caps (Multiple Minerals) .... One Every Other Day 7)  Potassium 99 Mg Tabs (Potassium) .... One Every Other Day 8)  Magnesium Oxide 500 Mg Tabs (Magnesium Oxide) .... One Daily 9)  Fish Oil   Oil (Fish Oil) .... One Every Other Day 10)  L-Tyrosine 500 Mg  Caps (Tyrosine) .... One Every Other Day 11)  Calcium 1250 Mg Tabs (Calcium Carbonate) .... One Every Other Day 12)  Vitamin B Complex-C   Caps (B Complex-C) .... One Every Other Day 13)  Actoplus Met 15-850 Mg Tabs (Pioglitazone Hcl-Metformin Hcl) .... Take 1 One  Times A Day 14)  Prozac 20 Mg Caps (Fluoxetine Hcl) .... 3 Tab By Mouth Daily. 15)  Lipitor 40 Mg Tabs (Atorvastatin Calcium) .Marland Kitchen.. 1 Once Daily For Cholesterol 16)  Ferrous Sulfate 325 (65 Fe) Mg  Tabs (Ferrous Sulfate) .... One Daily 17)  Tramadol Hcl 50 Mg Tabs (Tramadol Hcl) .... Take 2 Every 6 Hrs As Needed Pain 18)  Fenofibrate Micronized 134 Mg Caps (Fenofibrate Micronized) .Marland Kitchen.. 1 Once Daily For Triglycerides 19)  Lipitor 40 Mg Tabs (Atorvastatin Calcium) .Marland Kitchen.. 1 Daily For Cholesterol 20)  Lipitor 80 Mg Tabs (Atorvastatin Calcium) .... Take 1 Once Daily For Cholesterol 21)  Wellbutrin Sr 150 Mg Xr12h-Tab (Bupropion Hcl) .... Take 2 Each Morning For Depression 22)  Wellbutrin Sr 150 Mg Xr12h-Tab (Bupropion Hcl) .... 2 Tab By Mouth in The Morning, 1 Tab By Mouth Qhs 23)  Meloxicam 15 Mg Tabs (Meloxicam) .... One By Mouth Daily  Allergies: 1)  ! Lopid  Review of Systems  See HPI Psych:  Complains of anxiety, depression, and easily tearful; denies easily angered, irritability, mental problems, panic attacks, sense of great danger, suicidal thoughts/plans, thoughts of violence, unusual visions or sounds, and thoughts /plans of harming others.  Physical Exam  General:  alert, well-developed, well-nourished, and well-hydrated.   Psych:  normally interactive, good eye contact, not anxious appearing, and not depressed appearing.     Impression & Recommendations:  Problem # 1:  DEPRESSION, SITUATIONAL (ICD-309.0) Assessment Deteriorated Time spent with patient 25 minutes, more than 50% of this time was spent counseling patient on depression/anxiety.  Taking too much Wellbutrin, will change to 300 mg qam and 150 mg at  bedtime. Will increase Prozac to 60 mg but explained that it may worsen his sexual dysfuntion.  He is willing to take that risk at this time.  He is not interseted in changing to other classes of drug.  Follow up with me in one month.  Problem # 2:  KNEE PAIN, LEFT (ICD-719.46) Assessment: Deteriorated with other chronic pain.  Will add Meloxicam 15 mg daily to Tramadol. His updated medication list for this problem includes:    Aspirin 81 Mg Tabs (Aspirin) ..... One daily    Tramadol Hcl 50 Mg Tabs (Tramadol hcl) .Marland Kitchen... Take 2 every 6 hrs as needed pain    Meloxicam 15 Mg Tabs (Meloxicam) ..... One by mouth daily  Complete Medication List: 1)  Vitamin C 500 Mg Tabs (Ascorbic acid) .... One every other day 2)  Vitamin B-12 500 Mcg Tabs (Cyanocobalamin) .... One every other day 3)  Multivitamins Tabs (Multiple vitamin) .... One every otherday 4)  Trilipix 135 Mg Cpdr (Choline fenofibrate) .Marland Kitchen.. 1 once daily for elevated triglycerides 5)  Aspirin 81 Mg Tabs (Aspirin) .... One daily 6)  Mineral Complex Caps (Multiple minerals) .... One every other day 7)  Potassium 99 Mg Tabs (Potassium) .... One every other day 8)  Magnesium Oxide 500 Mg Tabs (Magnesium oxide) .... One daily 9)  Fish Oil Oil (Fish oil) .... One every other day 10)  L-tyrosine 500 Mg Caps (Tyrosine) .... One every other day 11)  Calcium 1250 Mg Tabs (Calcium carbonate) .... One every other day 12)  Vitamin B Complex-c Caps (B complex-c) .... One every other day 13)  Actoplus Met 15-850 Mg Tabs (Pioglitazone hcl-metformin hcl) .... Take 1 one  times a day 14)  Prozac 20 Mg Caps (Fluoxetine hcl) .... 3 tab by mouth daily. 15)  Lipitor 40 Mg Tabs (Atorvastatin calcium) .Marland Kitchen.. 1 once daily for cholesterol 16)  Ferrous Sulfate 325 (65 Fe) Mg Tabs (Ferrous sulfate) .... One daily 17)  Tramadol Hcl 50 Mg Tabs (Tramadol hcl) .... Take 2 every 6 hrs as needed pain 18)  Fenofibrate Micronized 134 Mg Caps (Fenofibrate micronized) .Marland Kitchen.. 1  once daily for triglycerides 19)  Lipitor 40 Mg Tabs (Atorvastatin calcium) .Marland Kitchen.. 1 daily for cholesterol 20)  Lipitor 80 Mg Tabs (Atorvastatin calcium) .... Take 1 once daily for cholesterol 21)  Wellbutrin Sr 150 Mg Xr12h-tab (Bupropion hcl) .... Take 2 each morning for depression 22)  Wellbutrin Sr 150 Mg Xr12h-tab (Bupropion hcl) .... 2 tab by mouth in the morning, 1 tab by mouth qhs 23)  Meloxicam 15 Mg Tabs (Meloxicam) .... One by mouth daily  Patient Instructions: 1)  It was nice to meet you, Mr. Niazi. 2)  Lets add Meloxicam 15 mg daily to your Tramadol (I called in a new prescripiton for you). 3)  We will increase your  Prozac to 60 mg daily (three 20 mg tablets) and your Wellbutrin to two 150 mg tablets in the morning and one 150 mg tablet at night. 4)  Please come back to see me in one month. Prescriptions: TRAMADOL HCL 50 MG TABS (TRAMADOL HCL) take 2 every 6 hrs as needed pain  #120 x 2   Entered and Authorized by:   Ruthe Mannan MD   Signed by:   Ruthe Mannan MD on 10/10/2009   Method used:   [facility]        [facility] [facility])       [address]       [place], [place]  [address]       Ph: [phone] or [phone]       Fax: [phone]   RxID:   [id number] MELOXICAM 15 MG TABS (MELOXICAM) one by mouth daily  #30 x 0   Entered and Authorized by:   Ruthe Mannan MD   Signed by:   Ruthe Mannan MD on 10/10/2009   Method used:   Electron[facility]        [facility] (retail)       [address]       [place], [place]  [address]       Ph: [phone] or [phone]       Fax: [phone]   RxID:   [id number] PROZAC 20 MG CAPS (FLUOXETINE HCL) 3 tab by mouth daily.  #90 x 3   Entered and Authorized by:   Ruthe Mannan MD   Signed by:   Ruthe Mannan MD on 10/10/2009   Method used:   [facility]ron[facility]        [facility] (retail)       [address]       [place], [place]  [address]       Ph: [phone] or  [phone]       Fax: [phone]   RxID:   [id number] WELLBUTRIN SR 150 MG XR12H-TAB (BUPROPION HCL) 2 tab by mouth in the morning, 1 tab by mouth qhs  #90 x 3   Entered and Authorized by:   Ruthe Mannan MD   Signed by:   Ruthe Mannan MD on 10/10/2009   Method used:   [facility]ron[facility]        [facility] [address] ([facility]ail)       [address]       [place], [place]  [address]       Ph: [phone] or [phone]       Fax: [phone]   RxID:   [id number]   Current Allergies (reviewed today): ! LOPID

## 2010-08-29 NOTE — Progress Notes (Signed)
Summary: Rx Alprazolam  Phone Note Refill Request Call back at [phone] Message from:  [facility] on June 08, 2010 12:20 PM  Refills Requested: Medication #1:  ALPRAZOLAM 0.25 MG TABS 1-2 tab by mouth three times a day as needed anxiey or shortness of breath   Last Refilled: 05/25/2010 Received faxed refill request please advise.   Method Requested: Telephone to Pharmacy Initial call taken by: Linde Gillis CMA Duncan Dull),  June 08, 2010 12:20 PM  Follow-up for Phone Call        Rx called to pharmacy Follow-up by: Linde Gillis CMA Duncan Dull),  June 08, 2010 2:26 PM    Prescriptions: ALPRAZOLAM 0.25 MG TABS (ALPRAZOLAM) 1-2 tab by mouth three times a day as needed anxiey or shortness of breath  #60 x 0   Entered and Authorized by:   Ruthe Mannan MD   Signed by:   Ruthe Mannan MD on 06/08/2010   Method used:   Telephoned to ...       [facility] (retail)       [address]       [place], [place]  [address]       Ph: [phone] or [phone]       Fax: [phone]   RxID:   [id number]

## 2010-08-29 NOTE — Progress Notes (Signed)
Summary: refill request for alprazolam  Phone Note Refill Request Message from:  Fax from Pharmacy  Refills Requested: Medication #1:  ALPRAZOLAM 0.25 MG TABS 1-2 tab by mouth three times a day as needed anxiey or shortness of breath   Last Refilled: 06/08/2010 [facility] [address], [phone].  Initial call taken by: Lowella Petties CMA, AAMA,  June 28, 2010 8:48 AM  Follow-up for Phone Call        No refill until 12/10.Marland Kitchen last prescription given 11/10. Follow-up by: Kerby Nora MD,  June 28, 2010 8:55 AM  Additional Follow-up for Phone Call Additional follow up Details #1::        Left message on voicemail at pharmacy advising as instructed. Additional Follow-up by: Linde Gillis CMA Duncan Dull),  June 28, 2010 9:25 AM

## 2010-08-29 NOTE — Progress Notes (Signed)
Summary: refill request for xanax  Phone Note Refill Request Call back at [phone] Message from:  Patient  Refills Requested: Medication #1:  ALPRAZOLAM 0.25 MG TABS 1-2 tab by mouth three times a day as needed anxiey or shortness of breath Pt's request for a refill was denied because last refill was on 11/10.  He states he only gets 10 days worth at a time, [facility].  Initial call taken by: Lowella Petties CMA, AAMA,  June 29, 2010 10:36 AM  Follow-up for Phone Call        Denied, follow-up with PCP to review usage pattern.   Recommend controlled substance database querry.  #60 06/08/2010 - by written rx, this is 30 day supply.  not negotiable. Hannah Beat MD  June 29, 2010 10:49 AM   Additional Follow-up for Phone Call Additional follow up Details #1::        Dr. Milinda Antis, please review and advise on whether pt can have refill before Dr. Dayton Martes gets back.  The patient says he takes 3 a day.  If he got 60 on 11/10 that would be a 20 day supply, and it has been 20 days.       Lowella Petties CMA, AAMA  June 29, 2010 11:04 AM  can go ahead and refil  px written on EMR for call in  Additional Follow-up by: Judith Part MD,  June 29, 2010 12:49 PM    Additional Follow-up for Phone Call Additional follow up Details #2::    [facility]             Lowella Petties CMA, AAMA  June 29, 2010 12:54 PM   Prescriptions: ALPRAZOLAM 0.25 MG TABS (ALPRAZOLAM) 1-2 tab by mouth three times a day as needed anxiey or shortness of breath  #60 x 0   Entered and Authorized by:   Judith Part MD   Signed by:   Lowella Petties CMA, AAMA on 06/29/2010   Method used:   Telephoned to ...       [facility] (retail)       [address]       [place], [place]  [address]       Ph: [phone] or [phone]       Fax: [phone]   RxID:   [id number]

## 2010-08-29 NOTE — Letter (Signed)
Summary: Out of Work  [facility]  [address] [place], [place] [address]   Phone: [phone]  Fax: [phone]    February 21, 2010   Employee:  DELAWRENCE FRIDMAN    To Whom It May Concern:   For Medical reasons, please excuse the above named employee from work for the following dates:  Start:    End:    If you need additional information, please feel free to contact our office.         Sincerely,    Ruthe Mannan MD

## 2010-08-29 NOTE — Progress Notes (Signed)
Summary: alprazolam   Phone Note Refill Request Message from:  Fax from Pharmacy on May 25, 2010 9:44 AM  Refills Requested: Medication #1:  ALPRAZOLAM 0.25 MG TABS 1-2 tab by mouth three times a day as needed anxiey or shortness of breath   Last Refilled: 05/08/2010 [facility] [phone].   Initial call taken by: Melody Comas,  May 25, 2010 9:45 AM  Follow-up for Phone Call        Rx called to pharmacy Follow-up by: Linde Gillis CMA Duncan Dull),  May 25, 2010 10:05 AM    Prescriptions: ALPRAZOLAM 0.25 MG TABS (ALPRAZOLAM) 1-2 tab by mouth three times a day as needed anxiey or shortness of breath  #60 x 0   Entered and Authorized by:   Ruthe Mannan MD   Signed by:   Ruthe Mannan MD on 05/25/2010   Method used:   Telephoned to ...       [facility] (retail)       [address]       [place], [place]  [address]       Ph: [phone] or [phone]       Fax: [phone]   RxID:   [id number]

## 2010-08-29 NOTE — Progress Notes (Signed)
Summary: refill request for andoderm  Phone Note Refill Request Message from:  Fax from Pharmacy  Refills Requested: Medication #1:  ANDRODERM 2.5 MG/24HR PT24 1 patch applied daily..   Last Refilled: 12/05/2009 [facility]axed request from [facility] [address], [phone].  Initial call taken by: Lowella Petties CMA,  December 29, 2009 1:25 PM  Follow-up for Phone Call        Rx called to pharmacy Follow-up by: Linde Gillis CMA Duncan Dull),  December 29, 2009 2:23 PM    Prescriptions: ANDRODERM 2.5 MG/24HR PT24 (TESTOSTERONE) 1 patch applied daily.  #30 x 0   Entered and Authorized by:   Ruthe Mannan MD   Signed by:   Ruthe Mannan MD on 12/29/2009   Method used:   Telephoned to ...       [facility] (retail)       [address]       [place], [place]  [address]       Ph: [phone] or [phone]       Fax: [phone]   RxID:   [id number]

## 2010-08-29 NOTE — Assessment & Plan Note (Signed)
Summary: 3 MONTH FOLLOW UP/[facility]   Vital Signs:  Patient profile:   45 year old male Height:      70 inches Weight:      196 pounds BMI:     28.22 Temp:     98.7 degrees F oral Pulse rate:   72 / minute Pulse rhythm:   regular BP sitting:   112 / 80  (left arm) Cuff size:   regular  Vitals Entered By: Linde Gillis CMA Duncan Dull) (May 01, 2010 3:29 PM) CC: 3 month follow up, labs given to patient   History of Present Illness: 45 yo here to follow up cholesterol.    HLD- deteriorated.  TG jumped to 351, LDL stable at 74.6, HDL 33. Taking Fenofibrate 134 mg daily, Lipitor 40 mg daily.  Compliant with medications.  DM- Fasting CBG elevated as well.  Reports that his CBGs have been great, ranging in 100-120s.  On Actoplus Met 15-850.    Current Medications (verified): 1)  Multivitamins   Tabs (Multiple Vitamin) .... One Every Otherday 2)  Aspirin 81 Mg  Tabs (Aspirin) .... One Daily 3)  Magnesium Oxide 500 Mg Tabs (Magnesium Oxide) .... One Daily 4)  Fish Oil   Oil (Fish Oil) .... One Every Other Day 5)  Actoplus Met 15-850 Mg Tabs (Pioglitazone Hcl-Metformin Hcl) .... Take 1 One  Times A Day 6)  Fenofibrate Micronized 134 Mg Caps (Fenofibrate Micronized) .Marland Kitchen.. 1 Once Daily For Triglycerides 7)  Ferrous Sulfate 325 (65 Fe) Mg Tabs (Ferrous Sulfate) .... Take 1 Tablet By Mouth Once A Day 8)  Vicodin Es 7.5-750 Mg Tabs (Hydrocodone-Acetaminophen) .Marland Kitchen.. 1-2 Tabs By Mouth Two Times A Day As Needed Pain 9)  Androderm 2.5 Mg/24hr Pt24 (Testosterone) .Marland Kitchen.. 1 Patch Applied Daily. 10)  Calcium 600 Mg Tabs (Calcium) .... Take One Tablet By Mouth Two Times A Day 11)  Valacyclovir Hcl 1 Gm Tabs (Valacyclovir Hcl) .... Take 2 Tablets By Mouth Two Times A Day X 1 Day 12)  Colchicine 0.6 Mg Tabs (Colchicine) .... 2 Tabs Po At  First Sign of Flare, Followed in 1 Hour With A Single Dose 1 Tab Po 13)  Alprazolam 0.25 Mg Tabs (Alprazolam) .Marland Kitchen.. 1-2 Tab By Mouth Three Times A Day As Needed Anxiey or  Shortness of Breath 14)  Crestor 10 Mg Tabs (Rosuvastatin Calcium) .Marland Kitchen.. 1 Tablet By Mouth Daily  Allergies: 1)  ! Lopid  Past History:  Past Medical History: Last updated: 12/04/2006 Hyperlipidemia  Past Surgical History: Last updated: 12/04/2006 Removal of bone spurs from both elbows- 1995  Family History: Last updated: 09/05/2007 Family History of Cardiovascular disorder Both parents still living and also 3 siblings as well. 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 CV:  Denies chest pain or discomfort. GI:  Denies nausea and vomiting.  Physical Exam  General:  alert, well-developed, well-nourished, and well-hydrated.  10 pound weight loss! Mouth:  MMM Lungs:  Normal respiratory effort, chest expands symmetrically. Lungs are clear to auscultation, no crackles or wheezes. Heart:  Normal rate and regular rhythm. S1 and  S2 normal without gallop, murmur, click, rub or other extra sounds. Skin:  Intact without suspicious lesions or rashes Psych:  slightly anxious, otherwise normal affect.Oriented X3 and normally interactive.     Impression & Recommendations:  Problem # 1:  PURE HYPERCHOLESTEROLEMIA (ICD-272.0) Assessment Deteriorated D/c lipitor, start crestor to hopefully improve HDL and TG.  Part of elevated TG likely due to deteriorated DM.  Recheck lipid panel in 8 weeks.  Congratulated him on weight loss.   The following medications were removed from the medication list:    Lipitor 40 Mg Tabs (Atorvastatin calcium) .Marland Kitchen... 1 once daily for cholesterol His updated medication list for this  problem includes:    Fenofibrate Micronized 134 Mg Caps (Fenofibrate micronized) .Marland Kitchen... 1 once daily for triglycerides    Crestor 10 Mg Tabs (Rosuvastatin calcium) .Marland Kitchen... 1 tablet by mouth daily  Problem # 2:  DIABETES-TYPE 2 (ICD-250.00) Assessment: Deteriorated Reheck a1c in 8 weeks when we check above labs. Discussed decreasing added sugars, eliminate trans fats, increase fiber and limit alcohol.   His updated medication list for this problem includes:    Aspirin 81 Mg Tabs (Aspirin) ..... One daily    Actoplus Met 15-850 Mg Tabs (Pioglitazone hcl-metformin hcl) .Marland Kitchen... Take 1 one  times a day  Complete Medication List: 1)  Multivitamins Tabs (Multiple vitamin) .... One every otherday 2)  Aspirin 81 Mg Tabs (Aspirin) .... One daily 3)  Magnesium Oxide 500 Mg Tabs (Magnesium oxide) .... One daily 4)  Fish Oil Oil (Fish oil) .... One every other day 5)  Actoplus Met 15-850 Mg Tabs (Pioglitazone hcl-metformin hcl) .... Take 1 one  times a day 6)  Fenofibrate Micronized 134 Mg Caps (Fenofibrate micronized) .Marland Kitchen.. 1 once daily for triglycerides 7)  Ferrous Sulfate 325 (65 Fe) Mg Tabs (Ferrous sulfate) .... Take 1 tablet by mouth once a day 8)  Vicodin Es 7.5-750 Mg Tabs (Hydrocodone-acetaminophen) .Marland Kitchen.. 1-2 tabs by mouth two times a day as needed pain 9)  Androderm 2.5 Mg/24hr Pt24 (Testosterone) .Marland Kitchen.. 1 patch applied daily. 10)  Calcium 600 Mg Tabs (Calcium) .... Take one tablet by mouth two times a day 11)  Valacyclovir Hcl 1 Gm Tabs (Valacyclovir hcl) .... Take 2 tablets by mouth two times a day x 1 day 12)  Colchicine 0.6 Mg Tabs (Colchicine) .... 2 tabs po at  first sign of flare, followed in 1 hour with a single dose 1 tab po 13)  Alprazolam 0.25 Mg Tabs (Alprazolam) .Marland Kitchen.. 1-2 tab by mouth three times a day as needed anxiey or shortness of breath 14)  Crestor 10 Mg Tabs (Rosuvastatin calcium) .Marland Kitchen.. 1 tablet by mouth daily  Patient Instructions: 1)  Please stop taking Lipitor. 2)  We will  start Crestor.   3)  Please come see me in 8 weeks for labs- fasting lipid panel, hepatic panel (272.4), a1c (250.00) Prescriptions: CRESTOR 10 MG TABS (ROSUVASTATIN CALCIUM) 1 tablet by mouth daily  #90 x 3   Entered and Authorized by:   Ruthe Mannan MD   Signed by:   Ruthe Mannan MD on 05/01/2010   Method used:   Print then Give to Patient   RxID:   [id number] VICODIN ES 7.5-750 MG TABS (HYDROCODONE-ACETAMINOPHEN) 1-2 tabs by mouth two times a day as needed pain  #120 x 0   Entered and Authorized by:   Ruthe Mannan MD   Signed by:   Ruthe Mannan MD on 05/01/2010   Method used:   Print then Give to  Patient   RxID:   [id number]   Current Allergies (reviewed today): ! LOPID

## 2010-08-29 NOTE — Progress Notes (Signed)
Summary: refill request for vicodin  Phone Note Refill Request Message from:  Fax from Pharmacy  Refills Requested: Medication #1:  VICODIN ES 7.5-750 MG TABS 1-2 tabs by mouth two times a day as needed pain   Last Refilled: 03/10/2010 [facility]axed request from [facility] [address], [phone]  Initial call taken by: Lowella Petties CMA,  March 30, 2010 12:53 PM  Follow-up for Phone Call        px written on EMR for call in  Follow-up by: Judith Part MD,  March 30, 2010 1:03 PM  Additional Follow-up for Phone Call Additional follow up Details #1::        [facility] instructed. Lewanda Rife LPN  March 30, 2010 2:46 PM     Prescriptions: VICODIN ES 7.5-750 MG TABS (HYDROCODONE-ACETAMINOPHEN) 1-2 tabs by mouth two times a day as needed pain  #90 x 0   Entered and Authorized by:   Judith Part MD   Signed by:   Lewanda Rife LPN on 11/91/4782   Method used:   Telephoned to ...       [facility] ([facility]ail)       [address]       [place], [place]  [address]       Ph: [phone] or [phone]       Fax: [phone]   RxID:   [id number]

## 2010-08-29 NOTE — Assessment & Plan Note (Signed)
Summary: 1 M F/U DLO   Vital Signs:  Patient profile:   45 year old male Height:      70 inches Weight:      203.25 pounds BMI:     29.27 Temp:     98 degrees F oral Pulse rate:   80 / minute Pulse rhythm:   regular BP sitting:   122 / 80  (left arm) Cuff size:   large  Vitals Entered By: Delilah Shan CMA Duncan Dull) (Dec 05, 2009 8:13 AM) CC: 1 month follow up   History of Present Illness: 45 yo here to follow up low testosterone and depression.    Started having symptoms of depression after he was laid off in 2009. Was placed on Prozac, titrated up to 30 mg last year, then Wellbutrin 300 mg qam was added in November 2010.  Last month, we decided to wean off of prozac and start Cymbalta 60 mg daily in the hopes that maybe it would help with his chronic pain.  We also checked his total testosterone levels because of lack of sex drive.   Has not noticed any decrease in size of his testicles. No h/o prostate CA.  No peripheral vision abnormalities. Total testosterone 215!  Started Androgel 5 g daily. FSH and LH were normal  Three weeks ago, he decided to stop his antidepressants and Tramadol. Felt like nothing was truly helping his depression. He also stopped using the androgel because he didn't like how sticky it made him feel.  Since stopping his medications, he is no longer tearful, has more energy, feels better than he has in years  still having pain, using 2-3 Vicodin daily for pain.   Current Medications (verified): 1)  Multivitamins   Tabs (Multiple Vitamin) .... One Every Otherday 2)  Aspirin 81 Mg  Tabs (Aspirin) .... One Daily 3)  Magnesium Oxide 500 Mg Tabs (Magnesium Oxide) .... One Daily 4)  Fish Oil   Oil (Fish Oil) .... One Every Other Day 5)  Actoplus Met 15-850 Mg Tabs (Pioglitazone Hcl-Metformin Hcl) .... Take 1 One  Times A Day 6)  Lipitor 40 Mg Tabs (Atorvastatin Calcium) .Marland Kitchen.. 1 Once Daily For Cholesterol 7)  Fenofibrate Micronized 134 Mg Caps (Fenofibrate  Micronized) .Marland Kitchen.. 1 Once Daily For Triglycerides 8)  Ferrous Sulfate 325 (65 Fe) Mg Tabs (Ferrous Sulfate) .... Take 1 Tablet By Mouth Once A Day 9)  Valtrex 1 Gm Tabs (Valacyclovir Hcl) .... 2 Tabs By Mouth Two Times A Day X 1 Days 10)  Vicodin Es 7.5-750 Mg Tabs (Hydrocodone-Acetaminophen) .Marland Kitchen.. 1-2 Tabs By Mouth Two Times A Day As Needed Pain 11)  Androderm 2.5 Mg/24hr Pt24 (Testosterone) .Marland Kitchen.. 1 Patch Applied Daily.  Allergies: 1)  ! Lopid  Review of Systems      See HPI Psych:  Denies anxiety, depression, easily angered, easily tearful, irritability, mental problems, panic attacks, sense of great danger, thoughts of violence, and unusual visions or sounds.  Physical Exam  General:  alert, well-developed, well-nourished, and well-hydrated.   Psych:  normally interactive, good eye contact, not anxious appearing, and not depressed appearing.     Impression & Recommendations:  Problem # 1:  TESTICULAR HYPOFUNCTION (ICD-257.2) Assessment Unchanged Will start andropatch instead today.  Recheck testorone, CBC, LFTs, cholesteorl next month.  Problem # 2:  DEPRESSION, SITUATIONAL (ICD-309.0) Assessment: Improved Time spent with patient 25 minutes, more than 50% of this time was spent counseling patient on depression.  I am ok with not going back  on his medications as they can worsen low testosterone.  Will refill Vicodin for pain and have him sign pain contract when available.  Complete Medication List: 1)  Multivitamins Tabs (Multiple vitamin) .... One every otherday 2)  Aspirin 81 Mg Tabs (Aspirin) .... One daily 3)  Magnesium Oxide 500 Mg Tabs (Magnesium oxide) .... One daily 4)  Fish Oil Oil (Fish oil) .... One every other day 5)  Actoplus Met 15-850 Mg Tabs (Pioglitazone hcl-metformin hcl) .... Take 1 one  times a day 6)  Lipitor 40 Mg Tabs (Atorvastatin calcium) .Marland Kitchen.. 1 once daily for cholesterol 7)  Fenofibrate Micronized 134 Mg Caps (Fenofibrate micronized) .Marland Kitchen.. 1 once daily for  triglycerides 8)  Ferrous Sulfate 325 (65 Fe) Mg Tabs (Ferrous sulfate) .... Take 1 tablet by mouth once a day 9)  Valtrex 1 Gm Tabs (Valacyclovir hcl) .... 2 tabs by mouth two times a day x 1 days 10)  Vicodin Es 7.5-750 Mg Tabs (Hydrocodone-acetaminophen) .Marland Kitchen.. 1-2 tabs by mouth two times a day as needed pain 11)  Androderm 2.5 Mg/24hr Pt24 (Testosterone) .Marland Kitchen.. 1 patch applied daily. Prescriptions: ANDRODERM 2.5 MG/24HR PT24 (TESTOSTERONE) 1 patch applied daily.  #30 x 0   Entered and Authorized by:   Ruthe Mannan MD   Signed by:   Ruthe Mannan MD on 12/05/2009   Method used:   Print then Give to Patient   RxID:   [id number] VICODIN ES 7.5-750 MG TABS (HYDROCODONE-ACETAMINOPHEN) 1-2 tabs by mouth two times a day as needed pain  #90 x 0   Entered and Authorized by:   Ruthe Mannan MD   Signed by:   Ruthe Mannan MD on 12/05/2009   Method used:   Print then Give to Patient   RxID:   [id number]   Current Allergies (reviewed today): ! LOPID

## 2010-08-29 NOTE — Progress Notes (Signed)
Summary: vicodin refill   Phone Note Refill Request Message from:  Fax from Pharmacy on April 19, 2010 10:19 AM  Refills Requested: Medication #1:  VICODIN ES 7.5-750 MG TABS 1-2 tabs by mouth two times a day as needed pain   Last Refilled: 03/30/2010 [facility] [phone]  Initial call taken by: Melody Comas,  April 19, 2010 10:20 AM  Follow-up for Phone Call        Rx called to pharmacy Follow-up by: Linde Gillis CMA Duncan Dull),  April 19, 2010 10:43 AM    Prescriptions: VICODIN ES 7.5-750 MG TABS (HYDROCODONE-ACETAMINOPHEN) 1-2 tabs by mouth two times a day as needed pain  #90 x 0   Entered and Authorized by:   Ruthe Mannan MD   Signed by:   Ruthe Mannan MD on 04/19/2010   Method used:   Telephoned to ...       [facility] (retail)       [address]       [place], [place]  [address]       Ph: [phone] or [phone]       Fax: [phone]   RxID:   [id number]

## 2010-08-29 NOTE — Progress Notes (Signed)
Summary: Valacyclovir  Phone Note Refill Request Message from:  [facility] on February 15, 2010 9:59 AM  Refills Requested: Medication #1:  VALACYCLOVIR HCL 1 GM TABS Take 2 tablets by mouth two times a day x 1 day. [facility] [id number]   Last Fill Date:  No date sent   Pharmacy Phone:  [phone]  This medication was not on the meds list originally, added for refill request.   Method Requested: Electronic Initial call taken by: Delilah Shan CMA (AAMA),  February 15, 2010 9:59 AM    New/Updated Medications: VALACYCLOVIR HCL 1 GM TABS (VALACYCLOVIR HCL) Take 2 tablets by mouth two times a day x 1 day Prescriptions: VALACYCLOVIR HCL 1 GM TABS (VALACYCLOVIR HCL) Take 2 tablets by mouth two times a day x 1 day  #4 x 1   Entered and Authorized by:   Ruthe Mannan MD   Signed by:   Ruthe Mannan MD on 02/15/2010   Method used:   Electronically to        [facility] (retail)       [address]       [place], [place]  [address]       Ph: [phone] or [phone]       Fax: [phone]   RxID:   [id number]

## 2010-08-29 NOTE — Progress Notes (Signed)
Summary: ? about Prozac and Tramadol  Phone Note Refill Request Call back at [phone] Message from:  Patient on September 21, 2009 4:32 PM  Refills Requested: Medication #1:  PROZAC 40 MG CAPS take 1 once daily  Medication #2:  TRAMADOL HCL 50 MG TABS take 1 every 6 hrs as needed pain Pt request refill on above meds. But at times pt has more severe joint pain in knees, back and shoulders and pt takes two pills of Tramadol 50mg  instead of one for pain.  Also Prozac 40mg  was helping but now not making alot of progress. Pt feels blue and no desire to do anything. Pt said Billie Bean had told him she could increase med if needed.  Pt has appt to see Dr Dayton Martes 01/25/10 at 8:15am but pt is soon out of meds above and wondered if med could be increased or does he need to make appt with Dr. Dayton Martes. [facility] pharmacy needed.Please advise. Pt understands he may not get reply back today and is OK with that.   Method Requested: Telephone to Pharmacy Initial call taken by: Lewanda Rife LPN,  September 21, 2009 4:36 PM  Follow-up for Phone Call        If we are titrating up his Prozac, he needs to be seen.  I am out of the office next week, make an appt for when I return.  I will call in enough refills. Follow-up by: Ruthe Mannan MD,  September 22, 2009 7:47 AM    Prescriptions: PROZAC 40 MG CAPS (FLUOXETINE HCL) take 1 once daily  #30 x 2   Entered and Authorized by:   Ruthe Mannan MD   Signed by:   Ruthe Mannan MD on 09/22/2009   Method used:   Electronically [facility]        [facility] (retail)       [address]       [place], [place]  [address]       Ph: [phone] or [phone]       Fax: [phone]   RxID:   [id number] TRAMADOL HCL 50 MG TABS (TRAMADOL HCL) take 1 every 6 hrs as needed pain  #90 x 2   Entered and Authorized by:   Ruthe Mannan MD   Signed by:   Ruthe Mannan MD on 09/22/2009   Method used:   [facility]ronically [facility]        [facility]  (retail)       [address]       [place], [place]  [address]       Ph: [phone] or [phone]       Fax: [phone]   RxID:   [id number]   Appended Document: ? about Prozac and Tramadol Patient Advised. Appointment scheduled  10/10/2009

## 2010-08-29 NOTE — Assessment & Plan Note (Signed)
Summary: discuss labs   Vital Signs:  Patient profile:   45 year old male Height:      70 inches Weight:      206.50 pounds BMI:     29.74 Temp:     98.5 degrees F oral Pulse rate:   72 / minute Pulse rhythm:   regular BP sitting:   110 / 72  (left arm) Cuff size:   large  Vitals Entered By: Linde Gillis CMA Duncan Dull) (January 25, 2010 8:20 AM) CC: discuss labs.   History of Present Illness: 45 yo here to follow up labs.  Currently on testosterone supplementation, Androderm 2.5 mg daily. Testosterone levels have improved and CBC stable.  HLD-deteriorated. On Lipitor 40 mg daily, Fenofibrate 134 mg daily and Fish oil 1 gram every other day. TG have increased to 279 (were 184), HDL 29  ( were 44 in January 2011).  Does not feel like his diet has worsened but he says there is room for improvement.  DM- a1c was stable at 6.9.  Does not routinely check his CBGs.  On Actoplus Met 15-850 mg daily.   Current Medications (verified): 1)  Multivitamins   Tabs (Multiple Vitamin) .... One Every Otherday 2)  Aspirin 81 Mg  Tabs (Aspirin) .... One Daily 3)  Magnesium Oxide 500 Mg Tabs (Magnesium Oxide) .... One Daily 4)  Fish Oil   Oil (Fish Oil) .... One Every Other Day 5)  Actoplus Met 15-850 Mg Tabs (Pioglitazone Hcl-Metformin Hcl) .... Take 1 One  Times A Day 6)  Lipitor 40 Mg Tabs (Atorvastatin Calcium) .Marland Kitchen.. 1 Once Daily For Cholesterol 7)  Fenofibrate Micronized 134 Mg Caps (Fenofibrate Micronized) .Marland Kitchen.. 1 Once Daily For Triglycerides 8)  Ferrous Sulfate 325 (65 Fe) Mg Tabs (Ferrous Sulfate) .... Take 1 Tablet By Mouth Once A Day 9)  Vicodin Es 7.5-750 Mg Tabs (Hydrocodone-Acetaminophen) .Marland Kitchen.. 1-2 Tabs By Mouth Two Times A Day As Needed Pain 10)  Androderm 2.5 Mg/24hr Pt24 (Testosterone) .Marland Kitchen.. 1 Patch Applied Daily. 11)  Calcium 600 Mg Tabs (Calcium) .... Take One Tablet By Mouth Two Times A Day  Allergies: 1)  ! Lopid  Past History:  Past Medical History: Last updated:  12/04/2006 Hyperlipidemia  Past Surgical History: Last updated: 12/04/2006 Removal of bone spurs from both elbows- 1995  Family History: Last updated: 09/05/2007 Family History of Cardiovascular disorder Both parents still living and also 3 siblings as well. 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  Physical Exam  General:  alert, well-developed, well-nourished, and well-hydrated.   Psych:  normally interactive, good eye contact, not anxious appearing, and not depressed appearing.     Impression & Recommendations:  Problem # 1:  PURE HYPERCHOLESTEROLEMIA (ICD-272.0) Assessment Deteriorated Time spent with patient 25 minutes, more than 50% of this time was spent counseling patient on elevated TG and low HDL. Since LDL is good, I do not want to increase Lipitor because it will provide minimal benefit above 40 mg but can cause harm in combination with fenofibrate. After discussing with Mr.  Freida Busman, we agreed to increase his fish oil and try dietary and lifestyle changes. See pt instructions for details. Pt to follow up in 3 months.  His updated medication list for this problem includes:    Lipitor 40 Mg Tabs (Atorvastatin calcium) .Marland Kitchen... 1 once daily for cholesterol    Fenofibrate Micronized 134 Mg Caps (Fenofibrate micronized) .Marland Kitchen... 1 once daily for triglycerides  Complete Medication List: 1)  Multivitamins Tabs (Multiple vitamin) .... One every otherday 2)  Aspirin 81 Mg Tabs (Aspirin) .... One daily 3)  Magnesium Oxide 500 Mg Tabs (Magnesium oxide) .... One  daily 4)  Fish Oil Oil (Fish oil) .... One every other day 5)  Actoplus Met 15-850 Mg Tabs (Pioglitazone hcl-metformin hcl) .... Take 1 one  times a day 6)  Lipitor 40 Mg Tabs (Atorvastatin calcium) .Marland Kitchen.. 1 once daily for cholesterol 7)  Fenofibrate Micronized 134 Mg Caps (Fenofibrate micronized) .Marland Kitchen.. 1 once daily for triglycerides 8)  Ferrous Sulfate 325 (65 Fe) Mg Tabs (Ferrous sulfate) .... Take 1 tablet by mouth once a day 9)  Vicodin Es 7.5-750 Mg Tabs (Hydrocodone-acetaminophen) .Marland Kitchen.. 1-2 tabs by mouth two times a day as needed pain 10)  Androderm 2.5 Mg/24hr Pt24 (Testosterone) .Marland Kitchen.. 1 patch applied daily. 11)  Calcium 600 Mg Tabs (Calcium) .... Take one tablet by mouth two times a day  Patient Instructions: 1)  Let's increase your fish oil to 3 grams daily. 2)  Triglycerides are too high.    Decrease added sugars, eliminate trans fats, increase fiber (try fiber one bars) and limit alcohol.  All these changes together can drop triglycerides by almost 50%. 3)  Increase physical activity. 4)  Make an appointment to come see me in 3 months.  Current Allergies (reviewed today): ! LOPID

## 2010-08-29 NOTE — Progress Notes (Signed)
Summary: vicodin   Phone Note Refill Request Message from:  Fax from Pharmacy on June 13, 2010 3:28 PM  Refills Requested: Medication #1:  VICODIN ES 7.5-750 MG TABS 1-2 tabs by mouth two times a day as needed pain   Last Refilled: 05/01/2010 [facility]. [phone].   Initial call taken by: Melody Comas,  June 13, 2010 3:29 PM  Follow-up for Phone Call        Rx called to pharmacy Follow-up by: Linde Gillis CMA Duncan Dull),  June 14, 2010 8:18 AM    Prescriptions: VICODIN ES 7.5-750 MG TABS (HYDROCODONE-ACETAMINOPHEN) 1-2 tabs by mouth two times a day as needed pain  #120 x 0   Entered and Authorized by:   Ruthe Mannan MD   Signed by:   Ruthe Mannan MD on 06/14/2010   Method used:   Telephoned to ...       [facility] [address] (retail)       [address]       [place], [place]  [address]       Ph: [phone] or [phone]       Fax: [phone]   RxID:   [id number]

## 2010-08-29 NOTE — Progress Notes (Signed)
Summary: Rx Indomethacin  Phone Note Refill Request Call back at [phone] Message from:  [facility] on March 10, 2010 11:00 AM  Refills Requested: Medication #1:  INDOMETHACIN 50 MG CAPS 1 by mouth 3 times daily with food Received E-script request please advise.   Method Requested: Electronic Initial call taken by: Linde Gillis CMA (AAMA),  March 10, 2010 11:00 AM    Prescriptions: INDOMETHACIN 50 MG CAPS (INDOMETHACIN) 1 by mouth 3 times daily with food, try to wean off as tolerated.  #30 x 0   Entered and Authorized by:   Ruthe Mannan MD   Signed by:   Ruthe Mannan MD on 03/10/2010   Method used:   [facility]ronically [facility]        [facility] (retail)       [address]       [place], [place]  [address]       Ph: [phone] or [phone]       Fax: [phone]   RxID:   [id number]

## 2010-08-29 NOTE — Progress Notes (Signed)
Summary: script needs to be changed  Phone Note Call from Patient Call back at Home Phone [phone]   Caller: Patient Call For: Ruthe Mannan MD Summary of Call: Pt states his vicodin script is still not right.  He wanted it to read 5/500 mg's, one to two 3 times a day.  Script that he picked up reads one 3 times a day.  He is asking that we change the script at the pharmacy so that he will be able to get an early refill next time, since he will be taking double what is on the current script. Initial call taken by: Lowella Petties CMA, AAMA,  June 16, 2010 8:05 AM  Follow-up for Phone Call        please make sure he has not already picked up other rx. Ruthe Mannan MD  June 16, 2010 10:26 AM  Pt has picked up script, he just wants the directions changed for future refills.               Lowella Petties CMA, AAMA  June 16, 2010 10:43 AM   Additional Follow-up for Phone Call Additional follow up Details #1::        ok it's changed. Ruthe Mannan MD  June 16, 2010 10:46 AM     New/Updated Medications: VICODIN 5-500 MG TABS (HYDROCODONE-ACETAMINOPHEN) 1-2  tab by mouth three times a day as needed pain Prescriptions: VICODIN 5-500 MG TABS (HYDROCODONE-ACETAMINOPHEN) 1-2  tab by mouth three times a day as needed pain  #180 x 0   Entered and Authorized by:   Ruthe Mannan MD   Signed by:   Ruthe Mannan MD on 06/16/2010   Method used:   Telephoned to ...       [facility] (retail)       [address]       [place], [place]  [address]       Ph: [phone] or [phone]       Fax: [phone]   RxID:   [id number]

## 2010-08-29 NOTE — Progress Notes (Signed)
Summary: vicodin  Phone Note Refill Request Call back at cell [phone] Message from:  Fax from Pharmacy on June 14, 2010 8:32 AM  Refills Requested: Medication #1:  VICODIN ES 7.5-750 MG TABS 1-2 tabs by mouth two times a day as needed pain   Last Refilled: 05/01/2010 [facility] [phone].   Initial call taken by: Melody Comas,  June 14, 2010 8:33 AM  Follow-up for Phone Call        denied.  already refiiled. Ruthe Mannan MD  June 14, 2010 8:40 AM  Pt is requesting that dose be changed to 5/500, one to two three times a day.  He has not picked up the script that was sent in yesterday.   He says that dose works better for him  Follow-up by: Lowella Petties CMA, AAMA,  June 14, 2010 1:02 PM  Additional Follow-up for Phone Call Additional follow up Details #1::        Patient advised as instructed via telephone.  Rx called to pharmacy. Additional Follow-up by: Linde Gillis CMA Duncan Dull),  June 14, 2010 2:07 PM    New/Updated Medications: VICODIN 5-500 MG TABS (HYDROCODONE-ACETAMINOPHEN) 1 tab by mouth three times a day as needed pain Prescriptions: VICODIN 5-500 MG TABS (HYDROCODONE-ACETAMINOPHEN) 1 tab by mouth three times a day as needed pain  #90 x 0   Entered and Authorized by:   Ruthe Mannan MD   Signed by:   Linde Gillis CMA (AAMA) on 06/14/2010   Method used:   Telephoned to ...       [facility] (retail)       [address]       [place], [place]  [address]       Ph: [phone] or [phone]       Fax: [phone]   RxID:   [id number]

## 2010-08-29 NOTE — Assessment & Plan Note (Signed)
Summary: GOUT/RBH   Vital Signs:  Patient profile:   45 year old male Height:      70 inches Temp:     98.4 degrees F oral Pulse rate:   72 / minute Pulse rhythm:   regular BP sitting:   130 / 62  (right arm) Cuff size:   regular  Vitals Entered By: Linde Gillis CMA Duncan Dull) (February 20, 2010 8:05 AM) CC: ? Gout Comments Patient refused to weigh because is having a lot of foot pain   History of Present Illness: 45 yo here for ? gout.  Had gout in his right foot years ago, woke up a few days ago with similar pain in his left great toe. Over past two days has become more red and warm. Now even the bed sheet touching it causes severe pain. No fevers, chills. DOes not drink beer. Did have some red meat a few days ago.  Current Medications (verified): 1)  Multivitamins   Tabs (Multiple Vitamin) .... One Every Otherday 2)  Aspirin 81 Mg  Tabs (Aspirin) .... One Daily 3)  Magnesium Oxide 500 Mg Tabs (Magnesium Oxide) .... One Daily 4)  Fish Oil   Oil (Fish Oil) .... One Every Other Day 5)  Actoplus Met 15-850 Mg Tabs (Pioglitazone Hcl-Metformin Hcl) .... Take 1 One  Times A Day 6)  Lipitor 40 Mg Tabs (Atorvastatin Calcium) .Marland Kitchen.. 1 Once Daily For Cholesterol 7)  Fenofibrate Micronized 134 Mg Caps (Fenofibrate Micronized) .Marland Kitchen.. 1 Once Daily For Triglycerides 8)  Ferrous Sulfate 325 (65 Fe) Mg Tabs (Ferrous Sulfate) .... Take 1 Tablet By Mouth Once A Day 9)  Vicodin Es 7.5-750 Mg Tabs (Hydrocodone-Acetaminophen) .Marland Kitchen.. 1-2 Tabs By Mouth Two Times A Day As Needed Pain 10)  Androderm 2.5 Mg/24hr Pt24 (Testosterone) .Marland Kitchen.. 1 Patch Applied Daily. 11)  Calcium 600 Mg Tabs (Calcium) .... Take One Tablet By Mouth Two Times A Day 12)  Valacyclovir Hcl 1 Gm Tabs (Valacyclovir Hcl) .... Take 2 Tablets By Mouth Two Times A Day X 1 Day 13)  Colchicine 0.6 Mg Tabs (Colchicine) .... 2 Tabs Po At  First Sign of Flare, Followed in 1 Hour With A Single Dose 1 Tab Po 14)  Indomethacin 50 Mg Caps (Indomethacin)  .Marland Kitchen.. 1 By Mouth 3 Times Daily With Food, Try To Wean Off As Tolerated.  Allergies: 1)  ! Lopid  Past History:  Past Medical History: Last updated: 12/04/2006 Hyperlipidemia  Past Surgical History: Last updated: 12/04/2006 Removal of bone spurs from both elbows- 1995  Family History: Last updated: 09/05/2007 Family History of Cardiovascular disorder Both parents still living and also 3 siblings as well. 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 MS:  Complains of joint pain, joint redness, and joint swelling; denies loss of strength.  Physical Exam  General:  alert, well-developed, well-nourished, and well-hydrated.   Msk:  Left great toe- red warm, very tender to palpation, FROM Psych:  normally interactive, good eye contact, not anxious appearing, and not depressed appearing.     Impression & Recommendations:  Problem #  1:  FOOT PAIN, LEFT (ICD-729.5) Assessment New Very consistent with gout.  Will start colchicine and Indomethacin and check uric acid level. Advised no red meat for next several days. Pt in agreement with plan. Orders: Venipuncture (09811) TLB-Uric Acid, Blood (84550-URIC)  Complete Medication List: 1)  Multivitamins Tabs (Multiple vitamin) .... One every otherday 2)  Aspirin 81 Mg Tabs (Aspirin) .... One daily 3)  Magnesium Oxide 500 Mg Tabs (Magnesium oxide) .... One daily 4)  Fish Oil Oil (Fish oil) .... One every other day 5)  Actoplus Met 15-850 Mg Tabs (Pioglitazone hcl-metformin hcl) .... Take 1 one  times a day 6)  Lipitor 40 Mg Tabs (Atorvastatin  calcium) .Marland Kitchen.. 1 once daily for cholesterol 7)  Fenofibrate Micronized 134 Mg Caps (Fenofibrate micronized) .Marland Kitchen.. 1 once daily for triglycerides 8)  Ferrous Sulfate 325 (65 Fe) Mg Tabs (Ferrous sulfate) .... Take 1 tablet by mouth once a day 9)  Vicodin Es 7.5-750 Mg Tabs (Hydrocodone-acetaminophen) .Marland Kitchen.. 1-2 tabs by mouth two times a day as needed pain 10)  Androderm 2.5 Mg/24hr Pt24 (Testosterone) .Marland Kitchen.. 1 patch applied daily. 11)  Calcium 600 Mg Tabs (Calcium) .... Take one tablet by mouth two times a day 12)  Valacyclovir Hcl 1 Gm Tabs (Valacyclovir hcl) .... Take 2 tablets by mouth two times a day x 1 day 13)  Colchicine 0.6 Mg Tabs (Colchicine) .... 2 tabs po at  first sign of flare, followed in 1 hour with a single dose 1 tab po 14)  Indomethacin 50 Mg Caps (Indomethacin) .Marland Kitchen.. 1 by mouth 3 times daily with food, try to wean off as tolerated. Prescriptions: VICODIN ES 7.5-750 MG TABS (HYDROCODONE-ACETAMINOPHEN) 1-2 tabs by mouth two times a day as needed pain  #90 x 0   Entered and Authorized by:   Ruthe Mannan MD   Signed by:   Ruthe Mannan MD on 02/20/2010   Method used:   Print then Give to Patient   RxID:   [id number] VALACYCLOVIR HCL 1 GM TABS (VALACYCLOVIR HCL) Take 2 tablets by mouth two times a day x 1 day  #20 x 1   Entered and Authorized by:   Ruthe Mannan MD   Signed by:   Ruthe Mannan MD on 02/20/2010   Method used:   Print then Give to Patient   RxID:   [id number] INDOMETHACIN 50 MG CAPS (INDOMETHACIN) 1 by mouth 3 times daily with food, try to wean off as tolerated.  #30 x 0   Entered and Authorized by:   Ruthe Mannan MD   Signed by:   Ruthe Mannan MD on 02/20/2010   Method used:   [facility]        [facility] (retail)       [address]       [place], [place]  [address]       Ph: [phone] or [phone]       Fax: [phone]   RxID:   [id number] COLCHICINE 0.6 MG TABS (COLCHICINE) 2 tabs po at  first sign of flare, followed in 1 hour  with a single dose 1 tab po  #30 x 0   Entered and Authorized by:   Ruthe Mannan MD   Signed by:   Ruthe Mannan MD on 02/20/2010   Method used:   Electronically to        [facility] [phone]* (retail)       [address]  [place], [place]  [address]       Ph: [phone] or [phone]       Fax: [phone]   RxID:   [id number]   Current Allergies (reviewed today): ! LOPID

## 2010-08-29 NOTE — Letter (Signed)
Summary: Out of Work  [facility]  [address] [place], [place] [address]   Phone: [phone]  Fax: [phone]    February 20, 2010   Employee:  Martin Downs    To Whom It May Concern:   For Medical reasons, please excuse the above named employee from work for the following dates:  Start:   02/20/2010  End:   02/22/2010  If you need additional information, please feel free to contact our office.         Sincerely,    Ruthe Mannan MD

## 2010-08-29 NOTE — Letter (Signed)
Summary: Generic Letter  [place] [facility]  [address] [place], [place] [address]   Phone: [phone]  Fax: [phone]    04/20/2010  Martin Downs [address] [place], [place]  [address]  Dear [name],    We have received your lab results and Dr. Dayton Martes says that your labs are essentially within normal limits.  Hemoglobin just a touch low, otherwise ok.  Please keep your appointment for the sleep study.         Sincerely,        Linde Gillis CMA (AAMA)for Dr. Ruthe Mannan, MD

## 2010-08-29 NOTE — Progress Notes (Signed)
Summary: refill request for vicodin  Phone Note Refill Request Message from:  Fax from Pharmacy  Refills Requested: Medication #1:  VICODIN ES 7.5-750 MG TABS 1-2 tabs by mouth two times a day as needed pain   Last Refilled: 02/20/2010 [facility] [address], [phone].  Initial call taken by: Lowella Petties CMA,  March 10, 2010 10:41 AM  Follow-up for Phone Call        Rx called to pharmacy Follow-up by: Linde Gillis CMA Duncan Dull),  March 10, 2010 11:02 AM    Prescriptions: VICODIN ES 7.5-750 MG TABS (HYDROCODONE-ACETAMINOPHEN) 1-2 tabs by mouth two times a day as needed pain  #90 x 0   Entered and Authorized by:   Ruthe Mannan MD   Signed by:   Ruthe Mannan MD on 03/10/2010   Method used:   Telephoned to ...       [facility] (retail)       [address]       [place], [place]  [address]       Ph: [phone] or [phone]       Fax: [phone]   RxID:   [id number]

## 2010-08-29 NOTE — Letter (Signed)
Summary: Out of Work  [facility] at [facility]  [address] [place], [place] [address]   Phone: [phone]  Fax: [phone]    February 21, 2010   Employee:  KARMINE KAUER    To Whom It May Concern:   Mr. Vora is clear to return to work tomorrow, July 27th. If you need additional information, please feel free to contact our office.         Sincerely,    Ruthe Mannan MD

## 2010-08-29 NOTE — Progress Notes (Signed)
Summary: refill request for vicodin  Phone Note Refill Request Message from:  Fax from Pharmacy  Refills Requested: Medication #1:  VICODIN ES 7.5-750 MG TABS 1-2 tabs by mouth two times a day as needed pain   Last Refilled: 12/21/2009 Faxed request from [facility] [address], [phone].  Initial call taken by: Lowella Petties CMA,  January 03, 2010 3:36 PM  Follow-up for Phone Call        Rx called to pharmacy Follow-up by: Linde Gillis CMA Duncan Dull),  January 04, 2010 9:35 AM    Prescriptions: VICODIN ES 7.5-750 MG TABS (HYDROCODONE-ACETAMINOPHEN) 1-2 tabs by mouth two times a day as needed pain  #90 x 0   Entered and Authorized by:   Ruthe Mannan MD   Signed by:   Ruthe Mannan MD on 01/04/2010   Method used:   Telephoned to ...       [facility] [address] (retail)       [address]       [place], [place]  [address]       Ph: [phone] or [phone]       Fax: [phone]   RxID:   [id number]

## 2010-08-29 NOTE — Letter (Signed)
Summary: Out of Work  [facility] at [facility]  [address] [place], [place] [address]   Phone: [phone]  Fax: [phone]    February 21, 2010   Employee:  AASIR DAIGLER    To Whom It May Concern:   Mr. Enrique is cleared to return to work today, July 26th, 2011.  If you need additional information, please feel free to contact our office.         Sincerely,    Ruthe Mannan MD

## 2010-08-29 NOTE — Progress Notes (Signed)
Summary: return to work  Phone Note Call from Patient Call back at [phone]   Caller: Patient Call For: Ruthe Mannan MD Summary of Call: Pt was seen today for gout.  He is asking for a work note stating he can go back tomorrow or wednesday.  He is feeling better, is supposed to go back on thursday but wants to try going tomorrow, and if not able, wants to go on wednesday. Initial call taken by: Lowella Petties CMA,  February 20, 2010 4:48 PM  Follow-up for Phone Call        Note given  to pt this morning. Ruthe Mannan MD  February 21, 2010 7:39 AM

## 2010-08-29 NOTE — Progress Notes (Signed)
Summary: Rx Hydrocodone & Alprazolam  Phone Note Refill Request Call back at [phone] Message [facility]:  [facility] on May 08, 2010 9:17 AM  Refills Requested: Medication #1:  VICODIN ES 7.5-750 MG TABS 1-2 tabs by mouth two times a day as needed pain   Last Refilled: 04/19/2010  Medication #2:  ALPRAZOLAM 0.25 MG TABS 1-2 tab by mouth three times a day as needed anxiey or shortness of breath   Last Refilled: 04/18/2010 Received faxed refill request please advise.   Method Requested: Telephone to Pharmacy Initial call taken by: Linde Gillis CMA Duncan Dull),  May 08, 2010 9:20 AM  Follow-up for Phone Call        Vicodin denied, just refilled it. Ruthe Mannan MD  May 08, 2010 10:37 AM  Rx called to pharmacy, notified of Vicodin Rx being denied.   Follow-up by: Linde Gillis CMA Duncan Dull),  May 08, 2010 10:52 AM    Prescriptions: ALPRAZOLAM 0.25 MG TABS (ALPRAZOLAM) 1-2 tab by mouth three times a day as needed anxiey or shortness of breath  #60 x 0   Entered and Authorized by:   Ruthe Mannan MD   Signed by:   Ruthe Mannan MD on 05/08/2010   Method used:   Telephoned to ...       [facility]* (retail)       [address]       [place], [place]  [address]       Ph: [phone] or [phone]       Fax: [phone]   RxID:   [id number]

## 2010-08-29 NOTE — Progress Notes (Signed)
Summary: refill request for vicodin  Phone Note Refill Request Message from:  Fax from Pharmacy  Refills Requested: Medication #1:  VICODIN ES 7.5-750 MG TABS 1-2 tabs by mouth two times a day as needed pain   Last Refilled: 12/05/2009 Faxed request from [facility] [address], [phone].  Initial call taken by: Lowella Petties CMA,  Dec 21, 2009 8:12 AM  Follow-up for Phone Call        Rx called to pharmacy Follow-up by: Linde Gillis CMA Duncan Dull),  Dec 21, 2009 8:26 AM    Prescriptions: VICODIN ES 7.5-750 MG TABS (HYDROCODONE-ACETAMINOPHEN) 1-2 tabs by mouth two times a day as needed pain  #90 x 0   Entered and Authorized by:   Ruthe Mannan MD   Signed by:   Ruthe Mannan MD on 12/21/2009   Method used:   Telephoned to ...       [facility] (retail)       [address]       [place], [place]  [address]       Ph: [phone] or [phone]       Fax: [phone]   RxID:   [id number]

## 2010-08-29 NOTE — Assessment & Plan Note (Signed)
Summary: 1 MONTH FOLLOW-UP/JRR   Vital Signs:  Patient profile:   45 year old male Height:      70 inches Weight:      201 pounds BMI:     28.94 Temp:     98.1 degrees F oral Pulse rate:   72 / minute Pulse rhythm:   regular BP sitting:   130 / 82  (left arm) Cuff size:   large  Vitals Entered By: Delilah Shan CMA Duncan Dull) (November 03, 2009 8:14 AM) CC: 1 month follow up   History of Present Illness: 45 yo here to follow up worsening symptoms of depression. Started having symptoms of depression after he was laid off in 2009. 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 Has a 18 yo son and a daughter at [place], feels a lot of pressure to take care of them financially.  Was placed on Prozac, titrated up to 30 mg last year, then Wellbutrin 300 mg qam was added in November 2010.  Titrated up his Wellbutrin on his own last month- was taking 600 mg!  Having difficulty sleeping, feeling anxious.  Having a lot of pain- muscle aches, joint pain- all over. Takes 100 mg of tramadol every 6 hours, helps with the pain but still having breakthrough pain. No panic attacks. Lack of interest in sexual intercourse.  When I saw him last month, I decreased his Wellbutrin to 300 mg qam and increased his Prozac to 60 mg daily.  He feels his symptoms have not improved at all.  Can't sleep. Symptoms of tearfulness and hopelessness got a little better but over past few months, feels worsening anhedonia.  No SI or HI.  Herpes labialis- getting cold sores, OTC preparations not helping.  Had one last month on his upper lip and now has two more that appears yesterday.  Current Medications (verified): 1)  Vitamin C 500 Mg  Tabs (Ascorbic Acid) .... One Every Other Day 2)  Vitamin B-12 500 Mcg  Tabs (Cyanocobalamin) .... One Every Other Day 3)  Multivitamins   Tabs (Multiple Vitamin) .... One Every Otherday 4)  Aspirin 81 Mg  Tabs (Aspirin) .... One Daily 5)  Mineral Complex   Caps (Multiple Minerals) .... One Every Other Day 6)  Potassium 99 Mg Tabs (Potassium) .... One Every Other Day 7)  Magnesium Oxide 500 Mg Tabs (Magnesium Oxide) .... One Daily 8)  Fish Oil   Oil (Fish Oil) .... One Every Other Day 9)  L-Tyrosine 500 Mg Caps (Tyrosine) .... One Every Other Day 10)  Calcium 1250 Mg Tabs (Calcium Carbonate) .... One Every Other Day 11)  Vitamin B Complex-C   Caps (B Complex-C) .... One Every Other Day 12)  Actoplus Met 15-850 Mg Tabs (Pioglitazone Hcl-Metformin Hcl) .... Take 1 One  Times A Day 13)  Lipitor 40 Mg Tabs (Atorvastatin Calcium) .Marland Kitchen.. 1 Once Daily For Cholesterol 14)  Fenofibrate Micronized 134 Mg Caps (Fenofibrate Micronized) .Marland Kitchen.. 1 Once Daily For Triglycerides 15)  Lipitor 40 Mg Tabs (Atorvastatin Calcium) .Marland Kitchen.. 1 Daily For Cholesterol 16)  Wellbutrin Sr 150 Mg Xr12h-Tab (Bupropion Hcl) .... 2 Tab By Mouth in The Morning, 1 Tab By Mouth Qhs 17)  Ferrous Sulfate 325 (65 Fe) Mg Tabs (Ferrous Sulfate) .... Take 1 Tablet By Mouth Once A Day 18)  Cymbalta 60 Mg Cpep (Duloxetine Hcl) .... Take One Capsule Daily 19)  Valtrex 1 Gm Tabs (Valacyclovir Hcl) .... 2 Tabs  By Mouth Two Times A Day X 1 Days  Allergies: 1)  ! Lopid  Review of Systems      See HPI Psych:  Complains of anxiety, depression, and easily tearful; denies easily angered, mental problems, panic attacks, sense of great danger, suicidal thoughts/plans, thoughts of violence, unusual visions or sounds, and thoughts /plans of harming others.  Physical Exam  General:  alert, well-developed, well-nourished, and well-hydrated.   Mouth:  herpes labialis, upper lip, two lesions Psych:  normally interactive, good eye contact, not anxious appearing, and not depressed appearing.     Impression & Recommendations:  Problem # 1:  DEPRESSION, SITUATIONAL (ICD-309.0) Assessment Deteriorated Time spent with patient 30 minutes, more than 50% of this time was spent counseling patient on anxiety and  depression. We discussed all of the different options. Will try Cymbalta as perhaps it may help with the pain. Wean off of Prozac (see pt instructions for detalis). He also requests that we check his testosterone level. Follow up in one month. I am also a bit concerned about his cholesterol medicaitions worsening his muscle aches- he is on lipitor 80 mg and fenofibrate which is known to be a combination that often leads to myalgias.  See below.  Problem # 2:  PURE HYPERCHOLESTEROLEMIA (ICD-272.0) Assessment: Unchanged See above.  Will decrease his Lipitor to 40 mg daily because the risks outweight the potential reduciton in LDL.  When he follows up next month, will recheck FLP and consider stopping fenofibrate and adding Niaspan instead.  Pt in agreement with plan. The following medications were removed from the medication list:    Trilipix 135 Mg Cpdr (Choline fenofibrate) .Marland Kitchen... 1 once daily for elevated triglycerides    Lipitor 80 Mg Tabs (Atorvastatin calcium) .Marland Kitchen... Take 1 once daily for cholesterol His updated medication list for this problem includes:    Lipitor 40 Mg Tabs (Atorvastatin calcium) .Marland Kitchen... 1 once daily for cholesterol    Fenofibrate Micronized 134 Mg Caps (Fenofibrate micronized) .Marland Kitchen... 1 once daily for triglycerides    Lipitor 40 Mg Tabs (Atorvastatin calcium) .Marland Kitchen... 1 daily for cholesterol  Problem # 3:  HERPES LABIALIS (ICD-054.9) Assessment: New Valtrex as needed outbreak.  Complete Medication List: 1)  Vitamin C 500 Mg Tabs (Ascorbic acid) .... One every other day 2)  Vitamin B-12 500 Mcg Tabs (Cyanocobalamin) .... One every other day 3)  Multivitamins Tabs (Multiple vitamin) .... One every otherday 4)  Aspirin 81 Mg Tabs (Aspirin) .... One daily 5)  Mineral Complex Caps (Multiple minerals) .... One every other day 6)  Potassium 99 Mg Tabs (Potassium) .... One every other day 7)  Magnesium Oxide 500 Mg Tabs (Magnesium oxide) .... One daily 8)  Fish Oil Oil (Fish  oil) .... One every other day 9)  L-tyrosine 500 Mg Caps (Tyrosine) .... One every other day 10)  Calcium 1250 Mg Tabs (Calcium carbonate) .... One every other day 11)  Vitamin B Complex-c Caps (B complex-c) .... One every other day 12)  Actoplus Met 15-850 Mg Tabs (Pioglitazone hcl-metformin hcl) .... Take 1 one  times a day 13)  Lipitor 40 Mg Tabs (Atorvastatin calcium) .Marland Kitchen.. 1 once daily for cholesterol 14)  Fenofibrate Micronized 134 Mg Caps (Fenofibrate micronized) .Marland Kitchen.. 1 once daily for triglycerides 15)  Lipitor 40 Mg Tabs (Atorvastatin calcium) .Marland Kitchen.. 1 daily for cholesterol 16)  Wellbutrin Sr 150 Mg Xr12h-tab (Bupropion hcl) .... 2 tab by mouth in the morning, 1 tab by mouth qhs 17)  Ferrous Sulfate 325 (65  Fe) Mg Tabs (Ferrous sulfate) .... Take 1 tablet by mouth once a day 18)  Cymbalta 60 Mg Cpep (Duloxetine hcl) .... Take one capsule daily 19)  Valtrex 1 Gm Tabs (Valacyclovir hcl) .... 2 tabs by mouth two times a day x 1 days  Other Orders: TLB-Testosterone, Total ([id number])  Patient Instructions: 1)  Let's try Cymbalta 60 mg daily. 2)  Start weaning off prozac, take 2 tablets x 4 days, then 1 tablet x 4 days, then 1 tab every other day for one week and stop. 3)  Let's decrease your Lipitor to 40 mg dailiy. 4)  Come back and see me in one month. 5)  Stop taking the Tramadol for a few weeks. Prescriptions: LIPITOR 40 MG TABS (ATORVASTATIN CALCIUM) 1 once daily for cholesterol  #30 x 3   Entered and Authorized by:   Ruthe Mannan MD   Signed by:   Ruthe Mannan MD on 11/03/2009   Method used:   [facility]        [facility] (retail)       [address]       [place], [place]  [address]       Ph: [phone] or [phone]       Fax: [phone]   RxID:   [id number] VALTREX 1 GM TABS (VALACYCLOVIR HCL) 2 tabs by mouth two times a day x 1 days  #20 x 0   Entered and Authorized by:   Ruthe Mannan MD   Signed by:   Ruthe Mannan MD on 11/03/2009   Method  used:   [facility]        [facility]  [place] (retail)       [address]       [place], [place]  [address]       Ph: [phone] or [phone]       Fax: [phone]   RxID:   [id number] CYMBALTA 60 MG CPEP (DULOXETINE HCL) take one capsule daily  #90 x 3   Entered and Authorized by:   Ruthe Mannan MD   Signed by:   Ruthe Mannan MD on 11/03/2009   Method used:   Electronically to        [facility]  [place] (retail)       [address]       [place], [place]  [address]       Ph: [phone] or [phone]       Fax: [phone]   RxID:   [id number]   Current Allergies (reviewed today): ! LOPID

## 2010-08-31 NOTE — Assessment & Plan Note (Signed)
Summary: FOLLOW UP/ lb   Vital Signs:  Patient profile:   45 year old male Height:      70 inches Weight:      208.50 pounds BMI:     30.02 Temp:     98.3 degrees F oral Pulse rate:   72 / minute Pulse rhythm:   regular BP sitting:   140 / 90  (right arm) Cuff size:   regular  Vitals Entered By: Linde Gillis CMA Duncan Dull) (July 26, 2010 9:31 AM) CC: one month follow up   History of Present Illness: 45 yo here to discuss multiple issues.  Anxiety/ panic attacks- asked pt to come in to discuss his alprazolam use with me.  He is not abusing his controlled substnaces contract, receivieing his rx from me and prescriptions from one pharmacy.  I am simple concerned that he is requiring 60 per month.  He reports his anxiety has improved.  Has not had a true panic attack in weeks.    Ready to quit using smokeless tobacco.  has tried nictotine patches and gums, nothing helping.  Wants to try Chantix.  URI symptoms- started over two weeks ago.  Family friends came over for the holidays.  Now has runny nose, facial pressure, malaise.  Dry cough, subjecitve fever and body aches.    Current Medications (verified): 1)  Multivitamins   Tabs (Multiple Vitamin) .... One Every Otherday 2)  Aspirin 81 Mg  Tabs (Aspirin) .... One Daily 3)  Magnesium Oxide 500 Mg Tabs (Magnesium Oxide) .... One Daily 4)  Fish Oil   Oil (Fish Oil) .... One Every Other Day 5)  Actoplus Met 15-850 Mg Tabs (Pioglitazone Hcl-Metformin Hcl) .... Take 1 One  Times A Day 6)  Fenofibrate Micronized 134 Mg Caps (Fenofibrate Micronized) .Marland Kitchen.. 1 Once Daily For Triglycerides 7)  Ferrous Sulfate 325 (65 Fe) Mg Tabs (Ferrous Sulfate) .... Take 1 Tablet By Mouth Once A Day 8)  Vicodin 5-500 Mg Tabs (Hydrocodone-Acetaminophen) .Marland Kitchen.. 1-2  Tab By Mouth Three Times A Day As Needed Pain 9)  Androderm 2.5 Mg/24hr Pt24 (Testosterone) .Marland Kitchen.. 1 Patch Applied Daily. 10)  Calcium 600 Mg Tabs (Calcium) .... Take One Tablet By Mouth Two Times A  Day 11)  Valacyclovir Hcl 1 Gm Tabs (Valacyclovir Hcl) .... Take 2 Tablets By Mouth Two Times A Day X 1 Day 12)  Colchicine 0.6 Mg Tabs (Colchicine) .... 2 Tabs Po At  First Sign of Flare, Followed in 1 Hour With A Single Dose 1 Tab Po 13)  Alprazolam 0.25 Mg Tabs (Alprazolam) .Marland Kitchen.. 1-2 Tab By Mouth Three Times A Day As Needed Anxiey or Shortness of Breath 14)  Crestor 10 Mg Tabs (Rosuvastatin Calcium) .Marland Kitchen.. 1 Tablet By Mouth Daily 15)  Chantix Starting Month Pak 0.5 Mg X 11 & 1 Mg X 42  Misc (Varenicline Tartrate) .... 0.5mg  By Mouth Once Daily For 3 Days, Then Twice Daily For 4 Days and Then 1mg  By Mouth 2 Times Daily 16)  Azithromycin 250 Mg  Tabs (Azithromycin) .... 2 By  Mouth Today and Then 1 Daily For 4 Days  Allergies: 1)  ! Lopid  Past History:  Past Medical History: Last updated: 12/04/2006 Hyperlipidemia  Past Surgical History: Last updated: 12/04/2006 Removal of bone spurs from both elbows- 1995  Family History: Last updated: 09/05/2007 Family History of Cardiovascular disorder Both parents still living and also 3 siblings as well. 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 ENT:  Complains of nasal congestion, sinus pressure, and sore throat. CV:  Denies chest pain or discomfort. Resp:  Complains of cough; denies shortness of breath, sputum productive, and wheezing. Psych:  Complains of panic attacks; denies sense of great danger and suicidal thoughts/plans.  Physical Exam  General:  alert, well-developed, well-nourished, and well-hydrated.   VSS, non toxic  appearing Ears:  R ear normal and L ear normal.   Nose:  external erythema, mild internal erythema. sinuses +/- TTP Mouth:  pharyngeal erythema.   Lungs:  Normal respiratory effort, chest expands symmetrically. Lungs are clear to auscultation, no crackles or wheezes. Heart:  Normal rate and regular rhythm. S1 and S2 normal without gallop, murmur, click, rub or other extra sounds. Extremities:  no edema Neurologic:  alert & oriented X3 and gait normal.   Psych:  slightly anxious, otherwise normal affect.Oriented X3 and normally interactive.     Impression & Recommendations:  Problem # 1:  GENERALIZED ANXIETY DISORDER (ICD-300.02) Assessment Improved Time spent with patient 25 minutes, more than 50% of this time was spent counseling patient on proper treatment of anxiety and panic attacks. Looked pt up on controlled sustances state webiste, no violations.  Benzo incredibly addictve and not long term solution.  Pt agrees to wean off Alprazolam and come back to see me in 3 months.  His updated medication list for this problem includes:    Alprazolam 0.25 Mg Tabs (Alprazolam) .Marland Kitchen... 1-2 tab by mouth three times a day as needed anxiey or shortness of breath  Problem # 2:  TOBACCO ABUSE (ICD-305.1) Assessment: Unchanged Try Chantix.  Discussed data.   His updated medication list for this problem includes:    Chantix Starting Month Pak 0.5 Mg X 11 & 1 Mg X 42 Misc (Varenicline tartrate) .Marland Kitchen... 0.5mg  by mouth once daily for 3 days, then twice daily for 4 days and then 1mg  by mouth 2 times daily  Problem # 3:  UPPER RESPIRATORY INFECTION, ACUTE (ICD-465.9) Assessment: New Given duration and progression of symptoms, will treat with Zpack. Continue supportive care as per pt instructions. His updated medication list for this problem includes:    Aspirin 81 Mg Tabs (Aspirin) ..... One daily  Complete Medication List: 1)  Multivitamins Tabs (Multiple vitamin) .... One every otherday 2)  Aspirin  81 Mg Tabs (Aspirin) .... One daily 3)  Magnesium Oxide 500 Mg Tabs (Magnesium oxide) .... One daily 4)  Fish Oil Oil (Fish oil) .... One every other day 5)  Actoplus Met 15-850 Mg Tabs (Pioglitazone hcl-metformin hcl) .... Take 1 one  times a day 6)  Fenofibrate Micronized 134 Mg Caps (Fenofibrate micronized) .Marland Kitchen.. 1 once daily for triglycerides 7)  Ferrous Sulfate 325 (65 Fe) Mg Tabs (Ferrous sulfate) .... Take 1 tablet by mouth once a day 8)  Vicodin 5-500 Mg Tabs (Hydrocodone-acetaminophen) .Marland Kitchen.. 1-2  tab by mouth three times a day as needed pain 9)  Androderm 2.5 Mg/24hr Pt24 (Testosterone) .Marland Kitchen.. 1 patch applied daily. 10)  Calcium 600 Mg Tabs (Calcium) .... Take one tablet by mouth two times a day 11)  Valacyclovir Hcl 1 Gm Tabs (Valacyclovir hcl) .Marland KitchenMarland KitchenMarland Kitchen  Take 2 tablets by mouth two times a day x 1 day 12)  Colchicine 0.6 Mg Tabs (Colchicine) .... 2 tabs po at  first sign of flare, followed in 1 hour with a single dose 1 tab po 13)  Alprazolam 0.25 Mg Tabs (Alprazolam) .Marland Kitchen.. 1-2 tab by mouth three times a day as needed anxiey or shortness of breath 14)  Crestor 10 Mg Tabs (Rosuvastatin calcium) .Marland Kitchen.. 1 tablet by mouth daily 15)  Chantix Starting Month Pak 0.5 Mg X 11 & 1 Mg X 42 Misc (Varenicline tartrate) .... 0.5mg  by mouth once daily for 3 days, then twice daily for 4 days and then 1mg  by mouth 2 times daily 16)  Azithromycin 250 Mg Tabs (Azithromycin) .... 2 by  mouth today and then 1 daily for 4 days  Patient Instructions: 1)  Good to see you, Martin Downs. 2)  Let's try to wean down the Alprazolam.  That means I only want you taking it as needed, meaning once a week.  If this is not working, we need to chnage to something else.  Please follow up in 3 months. 3)  Take antibiotic as directed.  Drink lots of fluids.  Treat sympotmatically with Mucinex, nasal saline irrigation, and Tylenol/Ibuprofen. Also try claritin D or zyrtec D over the counter- two times a day as needed ( have to sign for them  at pharmacy). You can use warm compresses.  Cough suppressant at night. Call if not improving as expected in 5-7 days.  Prescriptions: AZITHROMYCIN 250 MG  TABS (AZITHROMYCIN) 2 by  mouth today and then 1 daily for 4 days  #6 x 0   Entered and Authorized by:   Ruthe Mannan MD   Signed by:   Ruthe Mannan MD on 07/26/2010   Method used:   [facility]        [facility] [id number]       [address]       [place], [place]  [address]       Ph: [phone] or [phone]       Fax: [phone]   RxID:   [id number] CHANTIX STARTING MONTH PAK 0.5 MG X 11 & 1 MG X 42  MISC (VARENICLINE TARTRATE) 0.5mg  by mouth once daily for 3 days, then twice daily for 4 days and then 1mg  by mouth 2 times daily  #1 pack x 0   Entered and Authorized by:   Ruthe Mannan MD   Signed by:   Ruthe Mannan MD on 07/26/2010   Method used:   [facility]        [facility] [id number]ail[facility]       [address]       [place], [place]  [address]       Ph: [phone] or [phone]       Fax: [phone]   RxID:   [id number]    Orders Added: 1)  Est. Patient Level IV [427[address]]    Current Allergies (reviewed today): ! LOPID

## 2010-08-31 NOTE — Progress Notes (Signed)
Summary: fenofibrate   Phone Note Call from Patient Call back at [phone]   Caller: Patient Call For: Ruthe Mannan MD Summary of Call: Patient says that he will be due for a refill on his fenofibrate this week and the price has gone up alot. He is asking if you would be willing  to switch him to something less expensive. Please advise. [facility]  Initial call taken by: Melody Comas,  August 07, 2010 3:55 PM  Follow-up for Phone Call        unfortunately there is nothing cheaper that I am aware of.  The best thing to do would be for him to call his insurance company to see if there is something cheaper based on his plan. Ruthe Mannan MD  August 07, 2010 3:59 PM  Patient advised. He is going to check with his insurance company and he will call back with what he finds out.  Follow-up by: Melody Comas,  August 07, 2010 4:07 PM

## 2010-08-31 NOTE — Progress Notes (Signed)
Summary: refill request for vicodin  Phone Note Refill Request   Refills Requested: Medication #1:  VICODIN 5-500 MG TABS 1-2  tab by mouth three times a day as needed pain   Last Refilled: 06/29/2010 [facility] [address], [phone].  Initial call taken by: Lowella Petties CMA, AAMA,  August 11, 2010 8:31 AM  Follow-up for Phone Call        Rx called to pharmacy Follow-up by: Linde Gillis CMA Duncan Dull),  August 11, 2010 10:38 AM    Prescriptions: VICODIN 5-500 MG TABS (HYDROCODONE-ACETAMINOPHEN) 1-2  tab by mouth three times a day as needed pain  #280 x 0   Entered and Authorized by:   Ruthe Mannan MD   Signed by:   Ruthe Mannan MD on 08/11/2010   Method used:   Telephoned to ...       [facility] [facility]ail)       [address]       [place], [place]  [address]       Ph: [phone] or [phone]       Fax: [phone]   RxID:   [id number]

## 2010-08-31 NOTE — Progress Notes (Signed)
Summary: refill request for alprazolam  Phone Note Refill Request Message from:  Fax from Pharmacy  Refills Requested: Medication #1:  ALPRAZOLAM 0.25 MG TABS 1-2 tab by mouth three times a day as needed anxiey or shortness of breath   Last Refilled: 06/29/2010 [facility]axed request from [facility] [address].  Initial call taken by: Lowella Petties CMA, AAMA,  July 11, 2010 8:41 AM  Follow-up for Phone Call        Rx called to pharmacy Follow-up by: Linde Gillis CMA Duncan Dull),  July 11, 2010 10:11 AM    Prescriptions: ALPRAZOLAM 0.25 MG TABS (ALPRAZOLAM) 1-2 tab by mouth three times a day as needed anxiey or shortness of breath  #60 x 0   Entered and Authorized by:   Ruthe Mannan MD   Signed by:   Ruthe Mannan MD on 07/11/2010   Method used:   Telephoned to ...       [facility] (retail)       [address]       [place], [place]  [address]       Ph: [phone] or [phone]       Fax: [phone]   RxID:   [id number]

## 2010-08-31 NOTE — Assessment & Plan Note (Signed)
Summary: ear ache/alc   Vital Signs:  Patient profile:   45 year old male Height:      70 inches Weight:      200 pounds BMI:     28.80 Temp:     98.7 degrees F oral Pulse rate:   76 / minute Pulse rhythm:   regular BP sitting:   120 / 70  (right arm) Cuff size:   regular  Vitals Entered By: Linde Gillis CMA Duncan Dull) (August 11, 2010 11:29 AM) CC: right ear pain   History of Present Illness: 45 yo here with 5 day of right ear pain.  Feels like he has had decreased hearing in right ear for a few weeks, but now feels pain/pressure. No drainage, no URI symptoms. No fevers or chills.  Current Medications (verified): 1)  Multivitamins   Tabs (Multiple Vitamin) .... One Every Otherday 2)  Aspirin 81 Mg  Tabs (Aspirin) .... One Daily 3)  Magnesium Oxide 500 Mg Tabs (Magnesium Oxide) .... One Daily 4)  Fish Oil   Oil (Fish Oil) .... One Every Other Day 5)  Actoplus Met 15-850 Mg Tabs (Pioglitazone Hcl-Metformin Hcl) .... Take 1 One  Times A Day 6)  Fenofibrate Micronized 134 Mg Caps (Fenofibrate Micronized) .Marland Kitchen.. 1 Once Daily For Triglycerides 7)  Ferrous Sulfate 325 (65 Fe) Mg Tabs (Ferrous Sulfate) .... Take 1 Tablet By Mouth Once A Day 8)  Vicodin 5-500 Mg Tabs (Hydrocodone-Acetaminophen) .Marland Kitchen.. 1-2  Tab By Mouth Three Times A Day As Needed Pain 9)  Androderm 2.5 Mg/24hr Pt24 (Testosterone) .Marland Kitchen.. 1 Patch Applied Daily. 10)  Calcium 600 Mg Tabs (Calcium) .... Take One Tablet By Mouth Two Times A Day 11)  Valacyclovir Hcl 1 Gm Tabs (Valacyclovir Hcl) .... Take 2 Tablets By Mouth Two Times A Day X 1 Day 12)  Colchicine 0.6 Mg Tabs (Colchicine) .... 2 Tabs Po At  First Sign of Flare, Followed in 1 Hour With A Single Dose 1 Tab Po 13)  Alprazolam 0.25 Mg Tabs (Alprazolam) .Marland Kitchen.. 1-2 Tab By Mouth Three Times A Day As Needed Anxiey or Shortness of Breath 14)  Crestor 10 Mg Tabs (Rosuvastatin Calcium) .Marland Kitchen.. 1 Tablet By Mouth Daily 15)  Chantix Starting Month Pak 0.5 Mg X 11 & 1 Mg X 42  Misc  (Varenicline Tartrate) .... 0.5mg  By Mouth Once Daily For 3 Days, Then Twice Daily For 4 Days and Then 1mg  By Mouth 2 Times Daily  Allergies: 1)  ! Lopid  Past History:  Past Medical History: Last updated: 12/04/2006 Hyperlipidemia  Past Surgical History: Last updated: 12/04/2006 Removal of bone spurs from both elbows- 1995  Family History: Last updated: 09/05/2007 Family History of Cardiovascular disorder Both parents still living and also 3 siblings as well. 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 ENT:  Complains of decreased hearing and earache; denies ear discharge, nasal congestion, postnasal drainage, sinus pressure, and sore throat. Resp:  Denies cough.  Physical Exam  General:  alert, well-developed, well-nourished, and well-hydrated.   VSS, non toxic appearing Ears:  cerumen impaction right ear-  cerumen removed with curette and irrigation, s/p removal- TM clear left TM normal Nose:  External nasal examination shows no deformity or inflammation. Nasal mucosa are pink and moist without lesions or exudates. Mouth:  Oral mucosa and oropharynx without lesions or exudates.  Teeth in good repair. Lungs:  Normal respiratory effort, chest expands symmetrically. Lungs are clear to auscultation, no crackles or wheezes. Heart:  Normal rate and regular rhythm. S1 and S2 normal without gallop, murmur, click, rub or other extra sounds. Psych:  slightly anxious, otherwise normal affect.Oriented X3 and normally interactive.     Impression & Recommendations:  Problem # 1:  EAR PAIN, RIGHT  (ICD-388.70) Assessment New  likely due to cerumen impaction. Feels much better s/p cerumen removal.  Pain resolved and hearing improved.  Orders: Cerumen Impaction Removal ([address])  Complete Medication List: 1)  Multivitamins Tabs (Multiple vitamin) .... One every otherday 2)  Aspirin 81 Mg Tabs (Aspirin) .... One daily 3)  Magnesium Oxide 500 Mg Tabs (Magnesium oxide) .... One daily 4)  Fish Oil Oil (Fish oil) .... One every other day 5)  Actoplus Met 15-850 Mg Tabs (Pioglitazone hcl-metformin hcl) .... Take 1 one  times a day 6)  Fenofibrate Micronized 134 Mg Caps (Fenofibrate micronized) .Marland Kitchen.. 1 once daily for triglycerides 7)  Ferrous Sulfate 325 (65 Fe) Mg Tabs (Ferrous sulfate) .... Take 1 tablet by mouth once a day 8)  Vicodin 5-500 Mg Tabs (Hydrocodone-acetaminophen) .Marland Kitchen.. 1-2  tab by mouth three times a day as needed pain 9)  Androderm 2.5 Mg/24hr Pt24 (Testosterone) .Marland Kitchen.. 1 patch applied daily. 10)  Calcium 600 Mg Tabs (Calcium) .... Take one tablet by mouth two times a day 11)  Valacyclovir Hcl 1 Gm Tabs (Valacyclovir hcl) .... Take 2 tablets by mouth two times a day x 1 day 12)  Colchicine 0.6 Mg Tabs (Colchicine) .... 2 tabs po at  first sign of flare, followed in 1 hour with a single dose 1 tab po 13)  Alprazolam 0.25 Mg Tabs (Alprazolam) .Marland Kitchen.. 1-2 tab by mouth three times a day as needed anxiey or shortness of breath 14)  Crestor 10 Mg Tabs (Rosuvastatin calcium) .Marland Kitchen.. 1 tablet by mouth daily 15)  Chantix Starting Month Pak 0.5 Mg X 11 & 1 Mg X 42 Misc (Varenicline tartrate) .... 0.5mg  by mouth once daily for 3 days, then twice daily for 4 days and then 1mg  by mouth 2 times daily   Orders Added: 1)  Est. Patient Level III [60454] 2)  Cerumen Impaction Removal [09811]     Current Allergies (reviewed today): ! LOPID

## 2010-08-31 NOTE — Progress Notes (Signed)
Summary: refill request for alprazolam  Phone Note Refill Request Message from:  Fax from Pharmacy  Refills Requested: Medication #1:  ALPRAZOLAM 0.25 MG TABS 1-2 tab by mouth three times a day as needed anxiey or shortness of breath   Last Refilled: 07/11/2010 [facility]axed request from [facility] [address], [phone].  Initial call taken by: Lowella Petties CMA, AAMA,  July 19, 2010 8:15 AM  Follow-up for Phone Call        Just refilled 60 tablets on 12/13.  He is overusing this medication.  Will refill today but no more refills until he schedules follow up with me. Ruthe Mannan MD  July 19, 2010 8:45 AM   Additional Follow-up for Phone Call Additional follow up Details #1::        Medicine called to pharmacy.  Advised pt, follow up appt made. Additional Follow-up by: Lowella Petties CMA, AAMA,  July 19, 2010 9:27 AM    Prescriptions: ALPRAZOLAM 0.25 MG TABS (ALPRAZOLAM) 1-2 tab by mouth three times a day as needed anxiey or shortness of breath  #60 x 0   Entered and Authorized by:   Ruthe Mannan MD   Signed by:   Ruthe Mannan MD on 07/19/2010   Method used:   Telephoned to ...       [facility] ([facility]ail)       [address]       [place], [place]  [address]       Ph: [phone] or [phone]       Fax: [phone]   RxID:   [id number]

## 2010-09-01 ENCOUNTER — Telehealth: Payer: Self-pay | Admitting: Family Medicine

## 2010-09-06 NOTE — Progress Notes (Signed)
Summary: refill request for alprazolam  Phone Note Refill Request Message from:  Fax from Pharmacy  Refills Requested: Medication #1:  ALPRAZOLAM 0.25 MG TABS 1-2 tab by mouth three times a day as needed anxiey or shortness of breath   Last Refilled: 07/19/2010 Faxed request from [facility] [address], [phone].  Initial call taken by: Lowella Petties CMA, AAMA,  September 01, 2010 7:59 AM  Follow-up for Phone Call        Rx called to pharmacy Follow-up by: Linde Gillis CMA Duncan Dull),  September 01, 2010 9:34 AM    Prescriptions: ALPRAZOLAM 0.25 MG TABS (ALPRAZOLAM) 1-2 tab by mouth three times a day as needed anxiey or shortness of breath  #60 x 0   Entered and Authorized by:   Ruthe Mannan MD   Signed by:   Ruthe Mannan MD on 09/01/2010   Method used:   Telephoned to ...       [facility] (retail)       [address]       [place], [place]  [address]       Ph: [phone] or [phone]       Fax: [phone]   RxID:   [id number]

## 2010-09-08 ENCOUNTER — Telehealth: Payer: Self-pay | Admitting: Family Medicine

## 2010-09-14 NOTE — Progress Notes (Signed)
Summary: vicodin  Phone Note Refill Request Message from:  Fax from Pharmacy on September 08, 2010 1:18 PM  Refills Requested: Medication #1:  VICODIN 5-500 MG TABS 1-2  tab by mouth three times a day as needed pain   Last Refilled: 08/11/2010 Initial call taken by: Melody Comas,  September 08, 2010 1:19 PM  Follow-up for Phone Call        please phone in #180.  prevously 280 however if sig is 1-2 three times a day as needed pain, max for month would be 180.  will route to pcp as well. Follow-up by: Eustaquio Boyden  MD,  September 08, 2010 1:29 PM  Additional Follow-up for Phone Call Additional follow up Details #1::        Rx called in as directed.  Additional Follow-up by: Janee Morn CMA (AAMA),  September 08, 2010 1:42 PM    Prescriptions: VICODIN 5-500 MG TABS (HYDROCODONE-ACETAMINOPHEN) 1-2  tab by mouth three times a day as needed pain  #180 x 0   Entered and Authorized by:   Eustaquio Boyden  MD   Signed by:   Janee Morn CMA (AAMA) on 09/08/2010   Method used:   Telephoned to ...       [facility] (retail)       [address]       [place], [place]  [address]       Ph: [phone] or [phone]       Fax: [phone]   RxID:   [id number]

## 2010-09-27 ENCOUNTER — Other Ambulatory Visit (INDEPENDENT_AMBULATORY_CARE_PROVIDER_SITE_OTHER): Payer: 59

## 2010-09-27 ENCOUNTER — Other Ambulatory Visit: Payer: Self-pay | Admitting: Family Medicine

## 2010-09-27 ENCOUNTER — Encounter (INDEPENDENT_AMBULATORY_CARE_PROVIDER_SITE_OTHER): Payer: Self-pay | Admitting: *Deleted

## 2010-09-27 DIAGNOSIS — E119 Type 2 diabetes mellitus without complications: Secondary | ICD-10-CM

## 2010-09-27 LAB — HEMOGLOBIN A1C: Hgb A1c MFr Bld: 6.8 % — ABNORMAL HIGH (ref 4.6–6.5)

## 2010-09-29 ENCOUNTER — Telehealth: Payer: Self-pay | Admitting: Family Medicine

## 2010-10-05 NOTE — Progress Notes (Signed)
Summary: alprazolam  Phone Note Refill Request Message from:  Fax from Pharmacy on September 29, 2010 9:49 AM  Refills Requested: Medication #1:  ALPRAZOLAM 0.25 MG TABS 1-2 tab by mouth three times a day as needed anxiey or shortness of breath   Last Refilled: 09/01/2010 [facility] [phone].  Initial call taken by: Melody Comas,  September 29, 2010 9:50 AM  Follow-up for Phone Call        Rx called to pharmacy Follow-up by: Linde Gillis CMA Duncan Dull),  September 29, 2010 10:26 AM    Prescriptions: ALPRAZOLAM 0.25 MG TABS (ALPRAZOLAM) 1-2 tab by mouth three times a day as needed anxiey or shortness of breath  #60 x 0   Entered and Authorized by:   Ruthe Mannan MD   Signed by:   Ruthe Mannan MD on 09/29/2010   Method used:   Telephoned to ...       [facility] (retail)       [address]       [place], [place]  [address]       Ph: [phone] or [phone]       Fax: [phone]   RxID:   [id number]

## 2010-10-06 ENCOUNTER — Telehealth: Payer: Self-pay | Admitting: Family Medicine

## 2010-10-10 NOTE — Progress Notes (Signed)
Summary: Rx Vicodin  Phone Note Refill Request Call back at [phone] Message from:  [facility] on October 06, 2010 8:48 AM  Refills Requested: Medication #1:  VICODIN 5-500 MG TABS 1-2  tab by mouth three times a day as needed pain   Last Refilled: 09/08/2010 Received faxed refill request please advise.   Method Requested: Telephone to Pharmacy Initial call taken by: Linde Gillis CMA Duncan Dull),  October 06, 2010 8:49 AM  Follow-up for Phone Call        Rx called to pharmacy Follow-up by: Linde Gillis CMA Duncan Dull),  October 06, 2010 10:18 AM    Prescriptions: VICODIN 5-500 MG TABS (HYDROCODONE-ACETAMINOPHEN) 1-2  tab by mouth three times a day as needed pain  #180 x 0   Entered and Authorized by:   Ruthe Mannan MD   Signed by:   Ruthe Mannan MD on 10/06/2010   Method used:   Telephoned to ...       [facility] (retail)       [address]       [place], [place]  [address]       Ph: [phone] or [phone]       Fax: [phone]   RxID:   [id number]

## 2010-10-13 ENCOUNTER — Encounter: Payer: Self-pay | Admitting: Family Medicine

## 2010-10-30 ENCOUNTER — Other Ambulatory Visit: Payer: Self-pay | Admitting: *Deleted

## 2010-10-30 MED ORDER — ALPRAZOLAM 0.25 MG PO TABS: 0.2500 mg | ORAL_TABLET | Freq: Three times a day (TID) | ORAL | Status: DC | PRN

## 2010-10-30 NOTE — Telephone Encounter (Signed)
Rx called to pharamcy.

## 2010-11-06 ENCOUNTER — Other Ambulatory Visit: Payer: Self-pay | Admitting: *Deleted

## 2010-11-07 MED ORDER — HYDROCODONE-ACETAMINOPHEN 5-500 MG PO TABS: 1.0000 | ORAL_TABLET | Freq: Three times a day (TID) | ORAL | Status: DC | PRN

## 2010-11-07 NOTE — Telephone Encounter (Signed)
[facility]

## 2010-11-16 ENCOUNTER — Ambulatory Visit: Payer: 59 | Admitting: Family Medicine

## 2010-11-16 ENCOUNTER — Encounter: Payer: Self-pay | Admitting: Family Medicine

## 2010-11-16 ENCOUNTER — Ambulatory Visit (INDEPENDENT_AMBULATORY_CARE_PROVIDER_SITE_OTHER): Payer: 59 | Admitting: Family Medicine

## 2010-11-16 DIAGNOSIS — F1120 Opioid dependence, uncomplicated: Secondary | ICD-10-CM | POA: Insufficient documentation

## 2010-11-16 MED ORDER — HYDROCODONE-ACETAMINOPHEN 7.5-750 MG PO TABS: ORAL_TABLET | ORAL | Status: DC

## 2010-11-16 MED ORDER — CLONIDINE HCL 0.1 MG/24HR TD PTWK: MEDICATED_PATCH | TRANSDERMAL | Status: DC

## 2010-11-16 NOTE — Assessment & Plan Note (Signed)
>  25 min spent with patient, at least half of which was spent on counseling on narcotics abuse and withdrawal symptoms. I advised him to either go to the ER to withdrawal inpatient or I can prescribe clonidine patch. Given red flags symptoms. He feels very anxious about this week so I will give him a narcotic taper for next several days and advised to apply clonidine patch once his withdrawal symptoms resume. The patient indicates understanding of these issues and agrees with the plan. He has also contracted for safety.

## 2010-11-16 NOTE — Patient Instructions (Signed)
Apply clonidine patch once you start developing these symptoms again (after you have discontinued vicodin). Please go straight to the emergency room if symptoms worsen.

## 2010-11-16 NOTE — Progress Notes (Signed)
45 yo here to admit that he is addicted to Hydrocodone.  Runs out early because he has been using more than prescribed. Has even bought some from friends and off street when he gets desperate. Does not want to live like this anymore. Ran out a couple of days ago- having body aches, sweating, profuse diarrhea. Needs to get through the weekend, parents are coming in town. Daughter is coming home from [facility].    The PMH, PSH, Social History, Family History, Medications, and allergies have been reviewed in [facility], and have been updated if relevant.   Review of Systems       See HPI   Psych:  Complains of panic attacks; denies sense of great danger and suicidal thoughts/plans.  Physical Exam  General:  alert, well-developed, diaphoretic. Lungs:  Normal respiratory effort, chest expands symmetrically. Lungs are clear to auscultation, no crackles or wheezes. Heart:  Normal rate and regular rhythm. S1 and S2 normal without gallop, murmur, click, rub or other extra sounds. Psych:  slightly anxious and tearful, otherwise normal affect.Oriented X3 and normally interactive

## 2010-11-20 ENCOUNTER — Ambulatory Visit: Payer: 59 | Admitting: Family Medicine

## 2010-11-27 ENCOUNTER — Encounter: Payer: Self-pay | Admitting: Family Medicine

## 2010-11-27 ENCOUNTER — Ambulatory Visit (INDEPENDENT_AMBULATORY_CARE_PROVIDER_SITE_OTHER): Payer: 59 | Admitting: Family Medicine

## 2010-11-27 VITALS — BP 110/60 | HR 82 | Temp 99.7°F | Ht 70.0 in | Wt 201.0 lb

## 2010-11-27 DIAGNOSIS — F1120 Opioid dependence, uncomplicated: Secondary | ICD-10-CM

## 2010-11-27 DIAGNOSIS — G8929 Other chronic pain: Secondary | ICD-10-CM

## 2010-11-27 MED ORDER — TRAMADOL HCL 50 MG PO TABS: 50.0000 mg | ORAL_TABLET | Freq: Four times a day (QID) | ORAL | Status: DC | PRN

## 2010-11-27 MED ORDER — ALPRAZOLAM 0.25 MG PO TABS: 0.2500 mg | ORAL_TABLET | Freq: Three times a day (TID) | ORAL | Status: DC | PRN

## 2010-11-27 MED ORDER — CITALOPRAM HYDROBROMIDE 20 MG PO TABS: 20.0000 mg | ORAL_TABLET | Freq: Every day | ORAL | Status: DC

## 2010-11-27 NOTE — Progress Notes (Signed)
45 yo here to follow up addiction to Hydrocodone.   Saw him last week and admitted to addiction.  Did not have to use the clonidine patch, withdrawal symptoms were ok but now he has all over body aches and depression.  Wants to go back on the Hydrocodone and will have his wife keep them locked up because he "hurts all over."  Very nervous, depressed. Does not want to do anything, cannot concentrate. Denies SI or HI.  Cannot tolerate wellbutrin.    The PMH, PSH, Social History, Family History, Medications, and allergies have been reviewed in [facility], and have been updated if relevant.   Review of Systems       See HPI   Psych:  Complains of panic attacks; denies sense of great danger and suicidal thoughts/plans.  Physical Exam BP 110/60  Pulse 82  Temp(Src) 99.7 F (37.6 C) (Oral)  Ht 5\' 10"  (1.778 m)  Wt 201 lb (91.173 kg)  BMI 28.84 kg/m2  General:  alert, well-developed, diaphoretic. Lungs:  Normal respiratory effort, chest expands symmetrically. Lungs are clear to auscultation, no crackles or wheezes. Heart:  Normal rate and regular rhythm. S1 and S2 normal without gallop, murmur, click, rub or other extra sounds. Psych:  slightly anxious and tearful, otherwise normal affect.Oriented X3 and normally interactive

## 2010-11-27 NOTE — Patient Instructions (Signed)
Please stop by to see Martin Downs on your way out. 

## 2010-11-27 NOTE — Assessment & Plan Note (Signed)
>  25 min spent with face to face with patient counseling and coordinating care  Cannot restart his hydrocodone.  Explained this to Mr. Martin Downs, he understands. I think his current body aches are still from withdrawal.  Does have a h/o OA. Will refer to pain clinic for further management. Tramadol as needed for pain.( no h/o seizures) Will start Celexa 20 mg daily. Follow up in one month.

## 2010-12-15 NOTE — Op Note (Signed)
NAMEJERREL, TIBERIO                 ACCOUNT NO.:  [id number]   MEDICAL RECORD NO.:  [id number]          PATIENT TYPE:  AMB   LOCATION:  ENDO                         FACILITY:  [facility]   PHYSICIAN:  Jordan Hawks. Elnoria Howard, MD    DATE OF BIRTH:  Jan 06, 1966   DATE OF PROCEDURE:  05/22/2004  DATE OF DISCHARGE:                                 OPERATIVE REPORT   PROCEDURE PERFORMED:  Colonoscopy.   INDICATIONS FOR PROCEDURE:  Hematochezia.   ENDOSCOPIST:  Jordan Hawks. Elnoria Howard, MD   INSTRUMENT USED:  Olympus adult colonoscope.   PHYSICAL EXAMINATION:  CARDIAC:  Regular rate and rhythm.  LUNGS:  Clear to auscultation bilaterally.  ABDOMEN:  Flat, soft, nontender, nondistended.   MEDICATIONS GIVEN:  Versed 9 mg IV, Demerol 90 mg IV.   CONSENT:  Informed consent was obtained from the patient describing the  risks of bleeding, infection, perforation, medication reaction, a 10% miss  rate for a small colon cancer or polyp and the risk of death, all of which  are not exclusive of any other complications that can occur.   DESCRIPTION OF PROCEDURE:  After adequate sedation was achieved while in the  left lateral decubitus position, a rectal examination was performed which  was negative for any palpable abnormalities.  The colonoscope was then  inserted and advanced under direct visualization to the terminal ileum  without difficulty. The patient was noted to have an excellent prep.  While  in the terminal ileum, two small biopsies were obtained.  Photodocumentation  of the terminal ileum and cecum were also obtained.  Upon withdrawal of the  colonoscope, random biopsies in the ascending, transverse colon were  obtained.  No abnormalities were discerned in regard to masses, polyps,  inflammation, ulcerations or erosions.  Upon further withdrawal of the  colonoscope into the sigmoid colon, there was evidence of three sigmoid and  rectal polyps in total.  These were removed with cold snare polypectomy.  Good  hemostasis was achieved.  A 2 mm ulceration was suspected in the  sigmoid region. This is of uncertain significance.  There was no evidence of  overt inflammation.  With retroflexion of the colonoscope in the rectum, the  patient was noted to have moderate sized external hemorrhoids.  At this time  the colonoscope was straightened and withdrawn from the patient.  The  procedure was terminated and the patient tolerated the procedure well.   PLAN:  1.  Follow-up on biopsies.  2.  Continue high fiber diet.       PDH/MEDQ  D:  05/22/2004  T:  05/22/2004  Job:  [id number]

## 2010-12-26 ENCOUNTER — Other Ambulatory Visit: Payer: Self-pay | Admitting: *Deleted

## 2010-12-26 MED ORDER — ALPRAZOLAM 0.25 MG PO TABS: 0.2500 mg | ORAL_TABLET | Freq: Three times a day (TID) | ORAL | Status: DC | PRN

## 2010-12-27 ENCOUNTER — Other Ambulatory Visit: Payer: Self-pay | Admitting: Family Medicine

## 2010-12-27 ENCOUNTER — Other Ambulatory Visit: Payer: Self-pay | Admitting: *Deleted

## 2010-12-27 DIAGNOSIS — E785 Hyperlipidemia, unspecified: Secondary | ICD-10-CM

## 2010-12-27 DIAGNOSIS — E291 Testicular hypofunction: Secondary | ICD-10-CM

## 2010-12-27 DIAGNOSIS — E039 Hypothyroidism, unspecified: Secondary | ICD-10-CM

## 2010-12-27 MED ORDER — ALPRAZOLAM 0.25 MG PO TABS: 0.2500 mg | ORAL_TABLET | Freq: Three times a day (TID) | ORAL | Status: DC | PRN

## 2010-12-27 NOTE — Telephone Encounter (Signed)
Rx already called to pharmacy today.

## 2010-12-27 NOTE — Telephone Encounter (Signed)
[facility] 

## 2010-12-29 ENCOUNTER — Other Ambulatory Visit (INDEPENDENT_AMBULATORY_CARE_PROVIDER_SITE_OTHER): Payer: 59 | Admitting: Family Medicine

## 2010-12-29 DIAGNOSIS — E785 Hyperlipidemia, unspecified: Secondary | ICD-10-CM

## 2010-12-29 DIAGNOSIS — E039 Hypothyroidism, unspecified: Secondary | ICD-10-CM

## 2010-12-29 DIAGNOSIS — E291 Testicular hypofunction: Secondary | ICD-10-CM

## 2010-12-29 LAB — HEPATIC FUNCTION PANEL
AST: 19 U/L (ref 0–37)
Albumin: 4.8 g/dL (ref 3.5–5.2)
Alkaline Phosphatase: 39 U/L (ref 39–117)
Total Protein: 7 g/dL (ref 6.0–8.3)

## 2010-12-29 LAB — LDL CHOLESTEROL, DIRECT: Direct LDL: 93.3 mg/dL

## 2010-12-29 LAB — LIPID PANEL
Cholesterol: 149 mg/dL (ref 0–200)
HDL: 39.2 mg/dL (ref >39.00)

## 2011-01-01 LAB — TESTOSTERONE, FREE, TOTAL, SHBG
Sex Hormone Binding: 20 nmol/L (ref 13–71)
Testosterone-% Free: 2.5 % (ref 1.6–2.9)
Testosterone: 259.64 ng/dL (ref 250–890)

## 2011-01-02 ENCOUNTER — Encounter: Payer: Self-pay | Admitting: Family Medicine

## 2011-01-02 ENCOUNTER — Ambulatory Visit (INDEPENDENT_AMBULATORY_CARE_PROVIDER_SITE_OTHER): Payer: 59 | Admitting: Family Medicine

## 2011-01-02 VITALS — BP 120/80 | HR 78 | Temp 98.7°F | Ht 70.0 in | Wt 198.4 lb

## 2011-01-02 DIAGNOSIS — E079 Disorder of thyroid, unspecified: Secondary | ICD-10-CM

## 2011-01-02 DIAGNOSIS — E039 Hypothyroidism, unspecified: Secondary | ICD-10-CM

## 2011-01-02 DIAGNOSIS — E7800 Pure hypercholesterolemia, unspecified: Secondary | ICD-10-CM

## 2011-01-02 DIAGNOSIS — E291 Testicular hypofunction: Secondary | ICD-10-CM

## 2011-01-02 MED ORDER — TESTOSTERONE 2.5 MG/24HR TD PT24: 1.0000 | MEDICATED_PATCH | Freq: Every day | TRANSDERMAL | Status: DC

## 2011-01-02 NOTE — Assessment & Plan Note (Signed)
Deteriorated. Restarted Testosterone today, rx given.

## 2011-01-02 NOTE — Assessment & Plan Note (Signed)
Stable

## 2011-01-02 NOTE — Progress Notes (Signed)
45 yo here to follow up cholesterol, diabetes.    HLD- mcuh improved with Crestor 10 mg daily.   Lab Results  Component Value Date   HDL 39.20 12/29/2010   HDL 32.20* 06/16/2010   HDL 33.20* 04/24/2010   Lab Results  Component Value Date   LDLCALC 71 06/16/2010   LDLCALC 81 08/29/2009   LDLCALC 107* 04/26/2009   Lab Results  Component Value Date   ALT 18 12/29/2010   AST 19 12/29/2010   ALKPHOS 39 12/29/2010   BILITOT 0.9 12/29/2010      Compliant with medications and denies any side effects.  DM-   Does not check his CBGs regularly.   On Actoplus Met 15-850.   Lab Results  Component Value Date   HGBA1C 6.8* 09/27/2010   Low testosterone- Lab Results  Component Value Date   TESTOSTERONE 259.64 12/29/2010   Is starting to feel tired again.  Chronic pain- followed by pain clinic now that he abused pain contract.  The PMH, PSH, Social History, Family History, Medications, and allergies have been reviewed in [facility], and have been updated if relevant.   Review of Systems       See HPI General:  Denies malaise. CV:  Denies chest pain or discomfort. Resp:  Denies shortness of breath. MS:  Denies muscle aches, muscle weakness, and stiffness.  Physical Exam BP 120/80  Pulse 78  Temp(Src) 98.7 F (37.1 C) (Oral)  Ht 5\' 10"  (1.778 m)  Wt 198 lb 6.4 oz (89.994 kg)  BMI 28.47 kg/m2  General:  alert, well-developed, well-nourished, and well-hydrated.   Mouth:  MMM Lungs:  Normal respiratory effort, chest expands symmetrically. Lungs are clear to auscultation, no crackles or wheezes. Heart:  Normal rate and regular rhythm. S1 and S2 normal without gallop, murmur, click, rub or other extra sounds. Psych:  slightly anxious, otherwise normal affect.Oriented X3 and normally interactive.

## 2011-01-02 NOTE — Assessment & Plan Note (Signed)
Stable. Continue current meds.   

## 2011-01-03 ENCOUNTER — Other Ambulatory Visit: Payer: Self-pay | Admitting: Family Medicine

## 2011-01-03 ENCOUNTER — Other Ambulatory Visit: Payer: Self-pay | Admitting: *Deleted

## 2011-01-03 ENCOUNTER — Telehealth: Payer: Self-pay | Admitting: *Deleted

## 2011-01-03 MED ORDER — TESTOSTERONE 12.5 MG/ACT (1%) TD GEL: 50.0000 g | TRANSDERMAL | Status: DC

## 2011-01-03 NOTE — Telephone Encounter (Signed)
Pharmacy is questioning directions for testosterone gel. He says that 50 g is a lot and is asking if it is supposed to be 5.

## 2011-01-03 NOTE — Telephone Encounter (Signed)
Yes I apologize.  I'm not sure how that happened.  5 g is correct.

## 2011-01-03 NOTE — Telephone Encounter (Signed)
Patient is requesting a higher dose if Dr. Dayton Martes will give it to him.  [facility]  He stated that we gave him a Rx for the patches with he doesn't want and he would rather have the gel.  Please advise.

## 2011-01-03 NOTE — Telephone Encounter (Signed)
Rx that was sent yesterday did not go through because print was selected. Resent rx to pharmacy.

## 2011-01-03 NOTE — Telephone Encounter (Signed)
Called and spoke to pharmacist, no Rx on file for Martin Downs for Testosterone gel.  Will call patient to see if he still has Rx.

## 2011-01-04 MED ORDER — TESTOSTERONE 50 MG/5GM (1%) TD GEL: 10.0000 g | Freq: Every day | TRANSDERMAL | Status: DC

## 2011-01-04 NOTE — Telephone Encounter (Signed)
[facility]

## 2011-01-04 NOTE — Telephone Encounter (Signed)
Ok please call in rx as entered.

## 2011-01-23 ENCOUNTER — Other Ambulatory Visit: Payer: Self-pay | Admitting: *Deleted

## 2011-01-23 MED ORDER — ALPRAZOLAM 0.25 MG PO TABS: 0.2500 mg | ORAL_TABLET | Freq: Three times a day (TID) | ORAL | Status: DC | PRN

## 2011-01-23 NOTE — Telephone Encounter (Signed)
[facility] 

## 2011-02-19 ENCOUNTER — Other Ambulatory Visit: Payer: Self-pay | Admitting: Family Medicine

## 2011-02-20 ENCOUNTER — Other Ambulatory Visit: Payer: Self-pay | Admitting: *Deleted

## 2011-02-20 MED ORDER — PIOGLITAZONE HCL-METFORMIN HCL 15-850 MG PO TABS: 1.0000 | ORAL_TABLET | Freq: Every day | ORAL | Status: DC

## 2011-02-20 MED ORDER — ALPRAZOLAM 0.25 MG PO TABS: 0.2500 mg | ORAL_TABLET | Freq: Three times a day (TID) | ORAL | Status: DC | PRN

## 2011-02-20 MED ORDER — FENOFIBRATE MICRONIZED 134 MG PO CAPS: 134.0000 mg | ORAL_CAPSULE | Freq: Every day | ORAL | Status: DC

## 2011-02-20 NOTE — Telephone Encounter (Signed)
[facility] 

## 2011-03-21 ENCOUNTER — Other Ambulatory Visit: Payer: Self-pay | Admitting: *Deleted

## 2011-03-21 MED ORDER — TESTOSTERONE 50 MG/5GM (1%) TD GEL: 5.0000 g | Freq: Every day | TRANSDERMAL | Status: DC

## 2011-03-21 MED ORDER — ALPRAZOLAM 0.25 MG PO TABS: 0.2500 mg | ORAL_TABLET | Freq: Three times a day (TID) | ORAL | Status: DC | PRN

## 2011-03-21 NOTE — Telephone Encounter (Signed)
[facility]

## 2011-04-19 ENCOUNTER — Other Ambulatory Visit: Payer: Self-pay | Admitting: *Deleted

## 2011-04-19 MED ORDER — ALPRAZOLAM 0.25 MG PO TABS: 0.2500 mg | ORAL_TABLET | Freq: Three times a day (TID) | ORAL | Status: DC | PRN

## 2011-04-19 NOTE — Telephone Encounter (Signed)
[facility] for Androgel [facility].  This Rx was authorized on 03/21/2011 but was never called in.

## 2011-04-19 NOTE — Telephone Encounter (Signed)
Phoned request from [facility].  They said they sent faxed requests on 9/18, 9/19 and today.

## 2011-04-19 NOTE — Telephone Encounter (Signed)
[facility] 

## 2011-05-20 ENCOUNTER — Other Ambulatory Visit: Payer: Self-pay | Admitting: Family Medicine

## 2011-05-21 ENCOUNTER — Other Ambulatory Visit: Payer: Self-pay | Admitting: *Deleted

## 2011-05-21 MED ORDER — ALPRAZOLAM 0.25 MG PO TABS: 0.2500 mg | ORAL_TABLET | Freq: Three times a day (TID) | ORAL | Status: DC | PRN

## 2011-05-21 MED ORDER — TESTOSTERONE 50 MG/5GM (1%) TD GEL: 5.0000 g | Freq: Every day | TRANSDERMAL | Status: DC

## 2011-05-21 NOTE — Telephone Encounter (Signed)
Phoned request from [facility]. 

## 2011-05-22 ENCOUNTER — Other Ambulatory Visit: Payer: Self-pay | Admitting: *Deleted

## 2011-05-22 MED ORDER — ROSUVASTATIN CALCIUM 10 MG PO TABS: 10.0000 mg | ORAL_TABLET | Freq: Every day | ORAL | Status: DC

## 2011-05-22 NOTE — Telephone Encounter (Signed)
[facility]

## 2011-05-22 NOTE — Telephone Encounter (Signed)
Rx's were approved on yesterday but were not called in, refill request was sent back to [facility].  [facility] today.

## 2011-06-19 ENCOUNTER — Other Ambulatory Visit: Payer: Self-pay | Admitting: *Deleted

## 2011-06-19 MED ORDER — ALPRAZOLAM 0.25 MG PO TABS: 0.2500 mg | ORAL_TABLET | Freq: Three times a day (TID) | ORAL | Status: DC | PRN

## 2011-06-19 NOTE — Telephone Encounter (Signed)
Last refill 05/22/2011. 

## 2011-06-19 NOTE — Telephone Encounter (Signed)
[facility] 

## 2011-06-27 ENCOUNTER — Encounter: Payer: Self-pay | Admitting: Family Medicine

## 2011-06-27 ENCOUNTER — Ambulatory Visit (INDEPENDENT_AMBULATORY_CARE_PROVIDER_SITE_OTHER): Payer: 59 | Admitting: Family Medicine

## 2011-06-27 VITALS — BP 122/80 | HR 98 | Temp 98.2°F | Ht 70.0 in | Wt 206.5 lb

## 2011-06-27 DIAGNOSIS — G8929 Other chronic pain: Secondary | ICD-10-CM | POA: Insufficient documentation

## 2011-06-27 DIAGNOSIS — R5381 Other malaise: Secondary | ICD-10-CM

## 2011-06-27 DIAGNOSIS — E291 Testicular hypofunction: Secondary | ICD-10-CM

## 2011-06-27 DIAGNOSIS — R5383 Other fatigue: Secondary | ICD-10-CM

## 2011-06-27 LAB — HEPATIC FUNCTION PANEL
ALT: 51 U/L (ref 0–53)
Albumin: 4.6 g/dL (ref 3.5–5.2)
Bilirubin, Direct: 0.1 mg/dL (ref 0.0–0.3)
Total Protein: 7.1 g/dL (ref 6.0–8.3)

## 2011-06-27 LAB — CBC WITH DIFFERENTIAL/PLATELET
Basophils Absolute: 0 10*3/uL (ref 0.0–0.1)
Basophils Relative: 0.6 % (ref 0.0–3.0)
HCT: 39.8 % (ref 39.0–52.0)
Hemoglobin: 13.4 g/dL (ref 13.0–17.0)
Lymphs Abs: 1.6 10*3/uL (ref 0.7–4.0)
MCHC: 33.5 g/dL (ref 30.0–36.0)
Monocytes Relative: 5.6 % (ref 3.0–12.0)
Neutro Abs: 4.3 10*3/uL (ref 1.4–7.7)
RDW: 12.5 % (ref 11.5–14.6)

## 2011-06-27 LAB — VITAMIN B12: Vitamin B-12: 317 pg/mL (ref 211–911)

## 2011-06-27 MED ORDER — CITALOPRAM HYDROBROMIDE 40 MG PO TABS: 40.0000 mg | ORAL_TABLET | Freq: Every day | ORAL | Status: DC

## 2011-06-27 NOTE — Patient Instructions (Signed)
Good to see you. Please stop by to see Martin Downs on your way out. 

## 2011-06-27 NOTE — Progress Notes (Signed)
45 yo here for extreme fatigue for past month.  He has been a little more depressed lately. No SI or HI. Sleeping ok. On Celexa 20 mg daily.  Concerned that perhaps his testosterone is still low.  Low testosterone- Lab Results  Component Value Date   TESTOSTERONE 259.64 12/29/2010     Chronic pain- followed by pain clinic now that he abused pain contract. Not happy with current pain clinic. Would like to try another facility if possible.  The PMH, PSH, Social History, Family History, Medications, and allergies have been reviewed in [facility], and have been updated if relevant.   Review of Systems       See HPI General:  Denies malaise. CV:  Denies chest pain or discomfort. Resp:  Denies shortness of breath. MS:  Denies muscle aches, muscle weakness, and stiffness.  Physical Exam BP 122/80  Pulse 98  Temp(Src) 98.2 F (36.8 C) (Oral)  Ht 5\' 10"  (1.778 m)  Wt 206 lb 8 oz (93.668 kg)  BMI 29.63 kg/m2  General:  alert, well-developed, well-nourished, and well-hydrated.   Mouth:  MMM Lungs:  Normal respiratory effort, chest expands symmetrically. Lungs are clear to auscultation, no crackles or wheezes. Heart:  Normal rate and regular rhythm. S1 and S2 normal without gallop, murmur, click, rub or other extra sounds. Psych:  slightly anxious, otherwise normal affect.Oriented X3 and normally interactive.    Assessment and Plan: 1. TESTICULAR HYPOFUNCTION  Recheck labs today. Testosterone, free, total, CBC w/Diff, Hepatic function panel  2. FATIGUE  Likely multifactorial. Will increase Celexa to 40 mg daily, check labs. B12  3. Chronic pain  Ambulatory referral to Pain Clinic

## 2011-06-28 LAB — TESTOSTERONE, FREE, TOTAL, SHBG: Testosterone-% Free: 2.6 % (ref 1.6–2.9)

## 2011-06-28 MED ORDER — TESTOSTERONE 50 MG/5GM (1%) TD GEL: 10.0000 g | Freq: Every day | TRANSDERMAL | Status: DC

## 2011-06-28 NOTE — Progress Notes (Signed)
Addended by: Dianne Dun on: 06/28/2011 11:04 AM   Modules accepted: Orders

## 2011-07-11 ENCOUNTER — Other Ambulatory Visit: Payer: Self-pay | Admitting: *Deleted

## 2011-07-11 MED ORDER — VALACYCLOVIR HCL 1 G PO TABS: 2000.0000 mg | ORAL_TABLET | Freq: Two times a day (BID) | ORAL | Status: AC

## 2011-07-17 ENCOUNTER — Other Ambulatory Visit: Payer: Self-pay | Admitting: *Deleted

## 2011-07-17 MED ORDER — ALPRAZOLAM 0.25 MG PO TABS: 0.2500 mg | ORAL_TABLET | Freq: Three times a day (TID) | ORAL | Status: DC | PRN

## 2011-07-17 NOTE — Telephone Encounter (Signed)
[facility] 

## 2011-08-13 ENCOUNTER — Other Ambulatory Visit: Payer: Self-pay | Admitting: *Deleted

## 2011-08-13 MED ORDER — ALPRAZOLAM 0.25 MG PO TABS: 0.2500 mg | ORAL_TABLET | Freq: Three times a day (TID) | ORAL | Status: DC | PRN

## 2011-08-13 NOTE — Telephone Encounter (Signed)
[facility]

## 2011-08-23 ENCOUNTER — Ambulatory Visit (INDEPENDENT_AMBULATORY_CARE_PROVIDER_SITE_OTHER): Payer: 59 | Admitting: Family Medicine

## 2011-08-23 ENCOUNTER — Encounter: Payer: Self-pay | Admitting: Family Medicine

## 2011-08-23 ENCOUNTER — Telehealth: Payer: Self-pay | Admitting: Family Medicine

## 2011-08-23 VITALS — BP 118/62 | HR 92 | Temp 97.9°F | Wt 206.5 lb

## 2011-08-23 DIAGNOSIS — F32A Depression, unspecified: Secondary | ICD-10-CM

## 2011-08-23 DIAGNOSIS — F411 Generalized anxiety disorder: Secondary | ICD-10-CM

## 2011-08-23 DIAGNOSIS — F339 Major depressive disorder, recurrent, unspecified: Secondary | ICD-10-CM | POA: Insufficient documentation

## 2011-08-23 DIAGNOSIS — F329 Major depressive disorder, single episode, unspecified: Secondary | ICD-10-CM

## 2011-08-23 DIAGNOSIS — F1120 Opioid dependence, uncomplicated: Secondary | ICD-10-CM

## 2011-08-23 DIAGNOSIS — F3289 Other specified depressive episodes: Secondary | ICD-10-CM

## 2011-08-23 DIAGNOSIS — G8929 Other chronic pain: Secondary | ICD-10-CM

## 2011-08-23 MED ORDER — ZOLPIDEM TARTRATE 10 MG PO TABS: 10.0000 mg | ORAL_TABLET | Freq: Every evening | ORAL | Status: DC | PRN

## 2011-08-23 MED ORDER — PIOGLITAZONE HCL-METFORMIN HCL 15-850 MG PO TABS: 1.0000 | ORAL_TABLET | Freq: Every day | ORAL | Status: DC

## 2011-08-23 MED ORDER — FENOFIBRATE MICRONIZED 134 MG PO CAPS: 134.0000 mg | ORAL_CAPSULE | Freq: Every day | ORAL | Status: DC

## 2011-08-23 MED ORDER — LORAZEPAM 0.5 MG PO TABS: 0.5000 mg | ORAL_TABLET | Freq: Two times a day (BID) | ORAL | Status: AC | PRN

## 2011-08-23 MED ORDER — ROSUVASTATIN CALCIUM 10 MG PO TABS: 10.0000 mg | ORAL_TABLET | Freq: Every day | ORAL | Status: DC

## 2011-08-23 NOTE — Patient Instructions (Signed)
Hang in there, Martin Downs. Please stop by to see [name] on your way out.

## 2011-08-23 NOTE — Progress Notes (Signed)
46 yo here to discuss worsening depression.  H/o narcotic dependence, I no longer refill his narcotics.  Pain clinic continues to refill his hydrocodone.  Per pt, has been in a bad mood since last week.  Wife threw away all of his medication into a fire pit because she was angry with him.  He wants his medication refilled. Admits to anxiety and anger. No SI or HI.  Was sent home from work today due to "bad attitude."  Feels Celexa did not help.   Cannot tolerate wellbutrin.    Patient Active Problem List  Diagnoses  . HERPES LABIALIS  . HYPOTHYROIDISM  . THYROID STIMULATING HORMONE, ABNORMAL  . DIABETES-TYPE 2  . TESTICULAR HYPOFUNCTION  . PURE HYPERCHOLESTEROLEMIA  . HYPERLIPIDEMIA  . GOUT  . TOBACCO ABUSE  . DEPRESSION, SITUATIONAL  . KNEE PAIN, LEFT  . FOOT PAIN, LEFT  . INSOMNIA  . FATIGUE  . SHORTNESS OF BREATH  . GENERALIZED ANXIETY DISORDER  . EAR PAIN, RIGHT  . UPPER RESPIRATORY INFECTION, ACUTE  . Narcotic dependence  . Chronic pain  . Depression   Past Medical History  Diagnosis Date  . Hyperlipidemia    Past Surgical History  Procedure Date  . Removal of bone spur    History  Substance Use Topics  . Smoking status: Never Smoker   . Smokeless tobacco: Not on file  . Alcohol Use: Not on file   No family history on file. Allergies  Allergen Reactions  . Gemfibrozil     REACTION: diarrhea   Current Outpatient Prescriptions on File Prior to Visit  Medication Sig Dispense Refill  . Ascorbic Acid (VITAMIN C) 1000 MG tablet Take 1,000 mg by mouth daily.        Marland Kitchen aspirin 81 MG tablet Take 81 mg by mouth daily.        . Calcium Carbonate-Vit D-Min (CALCIUM 1200 PO) Take 1 tablet by mouth daily.        . citalopram (CELEXA) 40 MG tablet Take 1 tablet (40 mg total) by mouth daily.  30 tablet  6  . fish oil-omega-3 fatty acids 1000 MG capsule Take 1 g by mouth every other day.        . Magnesium 500 MG CAPS Take 1 capsule by mouth daily.        Marland Kitchen  oxyCODONE-acetaminophen (PERCOCET) 10-325 MG per tablet Take 1 tablet by mouth every 4 (four) hours as needed.        . testosterone (ANDROGEL) 50 MG/5GM GEL Place 10 g onto the skin daily.  250 g  0     The PMH, PSH, Social History, Family History, Medications, and allergies have been reviewed in [facility], and have been updated if relevant.   Review of Systems       See HPI   Psych:  Complains of panic attacks; denies sense of great danger and suicidal thoughts/plans.  Physical Exam BP 118/62  Pulse 92  Temp(Src) 97.9 F (36.6 C) (Oral)  Wt 206 lb 8 oz (93.668 kg)  General:  alert, well-developed, diaphoretic. Lungs:  Normal respiratory effort, chest expands symmetrically. Lungs are clear to auscultation, no crackles or wheezes. Heart:  Normal rate and regular rhythm. S1 and S2 normal without gallop, murmur, click, rub or other extra sounds. Psych:  slightly anxious and tearful, otherwise normal affect.Oriented X3 and normally interactive  Assessment and Plan: 1. Generalized anxiety disorder  >25 min spent with face to face with patient, >50% counseling and/or coordinating care.  Refused to refill his pain medications.  Advised that withdrawal symptoms would not be pleasant but would not cause a life threatening reaction.  Was able to get an appt with Dr. Dellia Cloud for tomorrow morning, pt contracted for safety.   Ambulatory referral to Psychology  2. Narcotic dependence  See above. Ambulatory referral to Psychology   Addendum:  Pt told [name] after he left the room that he did not want psych referral. I am concerned at this point that he is drug seeking and needs to be seen by psychology for his anger and depression.  [name] called pt to convey this message and continues to refuse the appointment.

## 2011-08-24 ENCOUNTER — Ambulatory Visit: Payer: 59 | Admitting: Family Medicine

## 2011-08-24 ENCOUNTER — Ambulatory Visit: Payer: 59 | Admitting: Psychology

## 2011-09-05 NOTE — Telephone Encounter (Signed)
Error no message.

## 2011-09-18 ENCOUNTER — Other Ambulatory Visit: Payer: Self-pay | Admitting: *Deleted

## 2011-09-18 NOTE — Telephone Encounter (Signed)
Please call in #15, no RF and then route to PMD as FYI on return to clinic.

## 2011-09-19 MED ORDER — ZOLPIDEM TARTRATE 10 MG PO TABS: 10.0000 mg | ORAL_TABLET | Freq: Every evening | ORAL | Status: DC | PRN

## 2011-09-19 NOTE — Telephone Encounter (Signed)
Rx called to pharmacy. Message sent to Dr. Dayton Martes for her review per Dr. Para March.

## 2011-09-19 NOTE — Telephone Encounter (Signed)
Ok to refill remainder of 1 month supply.

## 2011-09-20 NOTE — Telephone Encounter (Signed)
Just to confirm. Looking back in chart patient has always gotten 15 pills at a times do you want to give him 30.

## 2011-09-23 NOTE — Telephone Encounter (Signed)
yes

## 2011-09-24 NOTE — Telephone Encounter (Signed)
Medicine sent to pharmacy, # 15 ambien as instructed.

## 2011-10-15 ENCOUNTER — Telehealth: Payer: Self-pay | Admitting: Family Medicine

## 2011-10-15 NOTE — Telephone Encounter (Signed)
Called the pt and gave him the phone number and address of [facility].

## 2011-10-15 NOTE — Telephone Encounter (Signed)
I am happy that he wants to stop taking Narcotics. Agreed that he needs to contact facilities on his own.  We have tried to send him to behavioral health in past but he cancelled his appointment. Please give number to [facility].

## 2011-10-15 NOTE — Telephone Encounter (Signed)
Patient called to ask you about a drug treatment center program to get off Oxycodone. He says he needs to get off this medicine, and he goes to Dr Merv[facility] Kung Pain clinic and he continues to write him the RXs. Please advise what to tell this patient as to what his options are? Will try to get him the number for [facility].

## 2011-11-03 ENCOUNTER — Emergency Department (HOSPITAL_COMMUNITY)
Admission: EM | Admit: 2011-11-03 | Discharge: 2011-11-03 | Disposition: A | Discharge status: Home / Self Care | Payer: 59 | Attending: Emergency Medicine | Admitting: Emergency Medicine

## 2011-11-03 ENCOUNTER — Emergency Department (HOSPITAL_COMMUNITY): Payer: 59

## 2011-11-03 ENCOUNTER — Encounter (HOSPITAL_COMMUNITY): Payer: Self-pay | Admitting: *Deleted

## 2011-11-03 DIAGNOSIS — R0989 Other specified symptoms and signs involving the circulatory and respiratory systems: Secondary | ICD-10-CM | POA: Insufficient documentation

## 2011-11-03 DIAGNOSIS — R0600 Dyspnea, unspecified: Secondary | ICD-10-CM

## 2011-11-03 DIAGNOSIS — R0609 Other forms of dyspnea: Secondary | ICD-10-CM | POA: Insufficient documentation

## 2011-11-03 DIAGNOSIS — G8929 Other chronic pain: Secondary | ICD-10-CM | POA: Insufficient documentation

## 2011-11-03 DIAGNOSIS — E785 Hyperlipidemia, unspecified: Secondary | ICD-10-CM | POA: Insufficient documentation

## 2011-11-03 DIAGNOSIS — Z79899 Other long term (current) drug therapy: Secondary | ICD-10-CM | POA: Insufficient documentation

## 2011-11-03 HISTORY — DX: Anxiety disorder, unspecified: F41.9

## 2011-11-03 HISTORY — DX: Major depressive disorder, single episode, unspecified: F32.9

## 2011-11-03 HISTORY — DX: Other chronic pain: G89.29

## 2011-11-03 HISTORY — DX: Depression, unspecified: F32.A

## 2011-11-03 HISTORY — DX: Opioid dependence, uncomplicated: F11.20

## 2011-11-03 HISTORY — DX: Prediabetes: R73.03

## 2011-11-03 HISTORY — DX: Sleep apnea, unspecified: G47.30

## 2011-11-03 LAB — CBC
MCH: 30.3 pg (ref 26.0–34.0)
MCHC: 33.5 g/dL (ref 30.0–36.0)
MCV: 90.5 fL (ref 78.0–100.0)
Platelets: 228 10*3/uL (ref 150–400)
RBC: 4.19 MIL/uL — ABNORMAL LOW (ref 4.22–5.81)
RDW: 12.4 % (ref 11.5–15.5)

## 2011-11-03 LAB — POCT I-STAT, CHEM 8
Calcium, Ion: 1.24 mmol/L (ref 1.12–1.32)
Glucose, Bld: 252 mg/dL — ABNORMAL HIGH (ref 70–99)
HCT: 38 % — ABNORMAL LOW (ref 39.0–52.0)
Hemoglobin: 12.9 g/dL — ABNORMAL LOW (ref 13.0–17.0)
Potassium: 4 mEq/L (ref 3.5–5.1)

## 2011-11-03 MED ORDER — ALBUTEROL SULFATE HFA 108 (90 BASE) MCG/ACT IN AERS
2.0000 | INHALATION_SPRAY | RESPIRATORY_TRACT | Status: DC | PRN
  Filled 2011-11-03: qty 6.7

## 2011-11-03 NOTE — ED Provider Notes (Addendum)
History     CSN: [id number]  Arrival date & time 11/03/11  0455   First MD Initiated Contact with Patient 11/03/11 [phone]      Chief Complaint  Patient presents with  . Shortness of Breath    (Consider location/radiation/quality/duration/timing/severity/associated sxs/prior treatment) HPI This is a 46 year old white male with a history of sleeping up right a recliner because of chronic nasal congestion when lying supine. He was up and preparing to eat breakfast when he experienced sudden onset of shortness of breath. He states it feels as if he cannot take a good deep breath. There was no associated chest pain, diaphoresis, nausea or vomiting. The symptoms were worsened by lying supine in the ambulance on the way here and improved when he was allowed to stand up and walk around. At this time he feels better but still feels as if he cannot take as deep a breath as he is used to. The symptoms are moderate to severe and associated with anxiety. He was previously on Xanax for anxiety. He has never had a similar anxiety attack.  Past Medical History  Diagnosis Date  . Hyperlipidemia   . Sleep apnea   . Borderline diabetes   . Anxiety   . Chronic pain   . Depression   . Narcotic dependence     Past Surgical History  Procedure Date  . Removal of bone spur     History reviewed. No pertinent family history.  History  Substance Use Topics  . Smoking status: Never Smoker   . Smokeless tobacco: Not on file  . Alcohol Use: Not on file      Review of Systems  All other systems reviewed and are negative.    Allergies  Gemfibrozil  Home Medications   Current Outpatient Rx  Name Route Sig Dispense Refill  . ASPIRIN 81 MG PO TABS Oral Take 81 mg by mouth daily.      . COLCHICINE 0.6 MG PO TABS Oral Take 0.6 mg by mouth daily as needed. For gout flare ups Takes 1 tab daily until symptoms subsides    . FENOFIBRATE MICRONIZED 134 MG PO CAPS Oral Take 1 capsule (134 mg total) by  mouth daily before breakfast. 30 capsule 6  . METHADONE HCL 10 MG/ML PO CONC Oral Take 45 mg by mouth daily.    Marland Kitchen PIOGLITAZONE HCL-METFORMIN HCL 15-850 MG PO TABS Oral Take 1 tablet by mouth daily. 30 tablet 6  . ROSUVASTATIN CALCIUM 10 MG PO TABS Oral Take 1 tablet (10 mg total) by mouth daily. 30 tablet 6  . VALACYCLOVIR HCL 500 MG PO TABS Oral Take 500 mg by mouth 2 (two) times daily as needed. For cold sore flare ups Takes twice daily until cold sore clears up      There were no vitals taken for this visit.  Physical Exam General: Well-developed, well-nourished male in no acute distress; appearance consistent with age of record HENT: normocephalic, atraumatic Eyes: pupils equal round and reactive to light; extraocular muscles intact Neck: supple Heart: regular rate and rhythm Lungs: clear to auscultation bilaterally Abdomen: soft; nondistended Extremities: No deformity; full range of motion Neurologic: Awake, alert and oriented; motor function intact in all extremities and symmetric; no facial droop Skin: Warm and dry Psychiatric: Normal mood and affect    ED Course  Procedures (including critical care time)    MDM   Nursing notes and vitals signs, including pulse oximetry, reviewed.  Summary of this visit's results, reviewed by myself:  Labs:  Results for orders placed during the hospital encounter of 11/03/11  CBC      Component Value Range   WBC 5.6  4.0 - 10.5 (K/uL)   RBC 4.19 (*) 4.22 - 5.81 (MIL/uL)   Hemoglobin 12.7 (*) 13.0 - 17.0 (g/dL)   HCT 16.1 (*) 09.6 - 52.0 (%)   MCV 90.5  78.0 - 100.0 (fL)   MCH 30.3  26.0 - 34.0 (pg)   MCHC 33.5  30.0 - 36.0 (g/dL)   RDW 04.5  40.9 - 81.1 (%)   Platelets 228  150 - 400 (K/uL)  POCT I-STAT, CHEM 8      Component Value Range   Sodium 139  135 - 145 (mEq/L)   Potassium 4.0  3.5 - 5.1 (mEq/L)   Chloride 103  96 - 112 (mEq/L)   BUN 11  6 - 23 (mg/dL)   Creatinine, Ser 9.14  0.50 - 1.35 (mg/dL)   Glucose, Bld  782 (*) 70 - 99 (mg/dL)   Calcium, Ion 9.56  2.13 - 1.32 (mmol/L)   TCO2 25  0 - 100 (mmol/L)   Hemoglobin 12.9 (*) 13.0 - 17.0 (g/dL)   HCT 08.6 (*) 57.8 - 52.0 (%)    Imaging Studies: Dg Chest 2 View  11/03/2011  *RADIOLOGY REPORT*  Clinical Data: Shortness of breath  CHEST - 2 VIEW  Comparison: None.  Findings: Lungs are essentially clear. No pleural effusion or pneumothorax.  Cardiomediastinal silhouette is within normal limits.  Mild degenerative changes of the visualized thoracolumbar spine.  IMPRESSION: No evidence of acute cardiopulmonary disease.  Original Report Authenticated By: Charline Bills, M.D.   EKG Interpretation:  Date & Time: 11/03/2011 5:02 AM  Rate: 63  Rhythm: normal sinus rhythm  QRS Axis: normal  Intervals: normal  ST/T Wave abnormalities: normal  Conduction Disutrbances:nonspecific intraventricular conduction delay  Narrative Interpretation:   Old EKG Reviewed: none available  6:53 AM No significant change with albuterol inhaler. Patient states that he has a prior engagement and states he needs to leave at this time. There is no objective evidence of acute cardiopulmonary disease at this time. He has a scheduled appointment with his primary care physician Dr. Dayton Martes the day after tomorrow. He states he'll return should symptoms worsen. He states his personal opinion is that he is having a panic attack and he will discuss this at his appointment on Monday.             Hanley Seamen, MD 11/03/11 [id number]  Hanley Seamen, MD 11/03/11 2952

## 2011-11-03 NOTE — ED Notes (Signed)
Rx x 0, pt voiced understanding to f/u with PCP and use inhaler as prescribed.

## 2011-11-03 NOTE — ED Notes (Signed)
Per EMS: pt c/o difficulty sleeping, sleeps in recliner.  Pt states he cannot breathe while laying flat.  Ambulatory on scene.  Lungs clear, tachypnic.

## 2011-11-03 NOTE — ED Notes (Signed)
Pt c/o sudden onset of SOB tonight while sleeping in recliner.  Lung sounds clear to auscultation, states does not feel as short of breath at this time.  Denies CP, n/v, diaphoresis.

## 2011-11-05 ENCOUNTER — Encounter: Payer: Self-pay | Admitting: Family Medicine

## 2011-11-05 ENCOUNTER — Ambulatory Visit (INDEPENDENT_AMBULATORY_CARE_PROVIDER_SITE_OTHER): Payer: 59 | Admitting: Family Medicine

## 2011-11-05 VITALS — BP 134/84 | HR 76 | Temp 98.2°F | Wt 201.0 lb

## 2011-11-05 DIAGNOSIS — R0683 Snoring: Secondary | ICD-10-CM

## 2011-11-05 DIAGNOSIS — F1120 Opioid dependence, uncomplicated: Secondary | ICD-10-CM

## 2011-11-05 DIAGNOSIS — R0989 Other specified symptoms and signs involving the circulatory and respiratory systems: Secondary | ICD-10-CM

## 2011-11-05 DIAGNOSIS — F32A Depression, unspecified: Secondary | ICD-10-CM | POA: Insufficient documentation

## 2011-11-05 DIAGNOSIS — F1920 Other psychoactive substance dependence, uncomplicated: Secondary | ICD-10-CM

## 2011-11-05 DIAGNOSIS — R0609 Other forms of dyspnea: Secondary | ICD-10-CM

## 2011-11-05 DIAGNOSIS — F411 Generalized anxiety disorder: Secondary | ICD-10-CM

## 2011-11-05 DIAGNOSIS — F419 Anxiety disorder, unspecified: Secondary | ICD-10-CM

## 2011-11-05 MED ORDER — BUSPIRONE HCL 15 MG PO TABS: 7.5000 mg | ORAL_TABLET | Freq: Three times a day (TID) | ORAL | Status: DC

## 2011-11-05 MED ORDER — FLUTICASONE PROPIONATE 50 MCG/ACT NA SUSP: 2.0000 | Freq: Every day | NASAL | Status: DC

## 2011-11-05 NOTE — Progress Notes (Signed)
46 yo here to discuss worsening depression/anxiety.  H/o narcotic dependence, I no longer refill his narcotics.  Pain clinic discharged him for "not following the contract."  Per pt, he left voluntarily because he was tired of being addicted to Narcotics. Has been going to [facility] for past 3 weeks and now on Methadone.   Went to ER on 11/03/2011.  Notes reviewed.  Had SOB with anxiety. Feels when he lays down, nose is very congested and cannot breath. CBC, Chem 8, CXR, EKG within normal limits. Given albuterol inhaler, SOB did not improve.  Pt left without further evaluation or treatment. Feels his symptoms are related to anxiety and insomnia due to snoring.  Feels Celexa did not help.   Cannot tolerate wellbutrin.    Of note, in January, we made an appt for him with Dr. Dellia Cloud for anxiety and substance abuse. Pt cancelled appt.   Patient Active Problem List  Diagnoses  . HERPES LABIALIS  . HYPOTHYROIDISM  . THYROID STIMULATING HORMONE, ABNORMAL  . DIABETES-TYPE 2  . TESTICULAR HYPOFUNCTION  . PURE HYPERCHOLESTEROLEMIA  . HYPERLIPIDEMIA  . GOUT  . TOBACCO ABUSE  . DEPRESSION, SITUATIONAL  . KNEE PAIN, LEFT  . FOOT PAIN, LEFT  . INSOMNIA  . FATIGUE  . SHORTNESS OF BREATH  . GENERALIZED ANXIETY DISORDER  . EAR PAIN, RIGHT  . UPPER RESPIRATORY INFECTION, ACUTE  . Narcotic dependence  . Chronic pain  . Depression  . Anxiety   Past Medical History  Diagnosis Date  . Hyperlipidemia   . Sleep apnea   . Borderline diabetes   . Anxiety   . Chronic pain   . Depression   . Narcotic dependence    Past Surgical History  Procedure Date  . Removal of bone spur    History  Substance Use Topics  . Smoking status: Never Smoker   . Smokeless tobacco: Not on file  . Alcohol Use: Not on file   No family history on file. Allergies  Allergen Reactions  . Gemfibrozil     REACTION: diarrhea   Current Outpatient Prescriptions on File Prior to Visit  Medication Sig  Dispense Refill  . aspirin 81 MG tablet Take 81 mg by mouth daily.        . colchicine 0.6 MG tablet Take 0.6 mg by mouth daily as needed. For gout flare ups Takes 1 tab daily until symptoms subsides      . fenofibrate micronized (LOFIBRA) 134 MG capsule Take 1 capsule (134 mg total) by mouth daily before breakfast.  30 capsule  6  . methadone (DOLOPHINE) 10 MG/ML solution Take 45 mg by mouth daily.      . pioglitazone-metformin (ACTOPLUS MET) 15-850 MG per tablet Take 1 tablet by mouth daily.  30 tablet  6  . rosuvastatin (CRESTOR) 10 MG tablet Take 1 tablet (10 mg total) by mouth daily.  30 tablet  6  . valACYclovir (VALTREX) 500 MG tablet Take 500 mg by mouth 2 (two) times daily as needed. For cold sore flare ups Takes twice daily until cold sore clears up         The PMH, PSH, Social History, Family History, Medications, and allergies have been reviewed in [facility], and have been updated if relevant.   Review of Systems       See HPI   Psych:  Complains of panic attacks; denies sense of great danger and suicidal thoughts/plans.  Physical Exam BP 134/84  Pulse 76  Temp(Src) 98.2 F (36.8  C) (Oral)  Wt 201 lb (91.173 kg)  General:  alert, well-developed, diaphoretic. Lungs:  Normal respiratory effort, chest expands symmetrically. Lungs are clear to auscultation, no crackles or wheezes. Heart:  Normal rate and regular rhythm. S1 and S2 normal without gallop, murmur, click, rub or other extra sounds. Psych:  slightly anxious and tearful, otherwise normal affect.Oriented X3 and normally interactive  Assessment and Plan: 1. Narcotic dependence  Now followed by [facility] and on Methadone.   2. Anxiety  Deteriorated. Discussed that we cannot use benzos although he wants something short acting and as needed.   Will start Buspar 7.5 mg three times daily advised this is not an as needed medication. The patient indicates understanding of these issues and agrees with the plan.    3.  Snoring  Will refer for sleep study per pt request. Also given rx for flonase to help with nasal congestion. Ambulatory referral to Pulmonology

## 2011-11-05 NOTE — Patient Instructions (Signed)
Let's start the flonase. Use Afrin with caution- intermittently. We are starting Buspar 7.5 mg three times daily.

## 2011-11-23 ENCOUNTER — Encounter: Payer: Self-pay | Admitting: Pulmonary Disease

## 2011-11-23 ENCOUNTER — Ambulatory Visit (INDEPENDENT_AMBULATORY_CARE_PROVIDER_SITE_OTHER): Payer: 59 | Admitting: Pulmonary Disease

## 2011-11-23 VITALS — BP 114/70 | HR 68 | Temp 98.7°F | Ht 69.0 in | Wt 209.0 lb

## 2011-11-23 DIAGNOSIS — G4710 Hypersomnia, unspecified: Secondary | ICD-10-CM | POA: Insufficient documentation

## 2011-11-23 NOTE — Patient Instructions (Signed)
Will set up for a sleep study, and will arrange followup once results are available.

## 2011-11-23 NOTE — Progress Notes (Signed)
  Subjective:    Patient ID: Martin Downs, male    DOB: 1965/11/06, 46 y.o.   MRN: [id number]  HPI The patient is a 46 year old male who been asked to see for sleep disruption and hypersomnia.  The patient has been having episodes at night where he will suddenly awaken with a feeling of breathlessness, and then he becomes very anxious and is unable to return to sleep.  He will typically go well into the family room and sleep in a recliner.  He will continue to have fragmented sleep.  He has been noted to have snoring by his wife, but she has not commented on an abnormal breathing pattern during sleep.  He has frequent awakenings during the night, and is not rested in the mornings upon arising.  He denies any reflux symptoms at night, and has no history of asthma.  He notes significant sleep pressure during the day with dozing, and will fall asleep watching television or movies.  He has some sleepiness with driving, primarily longer distances.  The patient states that his weight is neutral of the last 2 years, and his Epworth sleepiness score today is 21.  Sleep Questionnaire: What time do you typically go to bed?( Between what hours) 10:30 pm How long does it take you to fall asleep? 10 mins How many times during the night do you wake up? 5 What time do you get out of bed to start your day? 0500 Do you drive or operate heavy machinery in your occupation? Yes How much has your weight changed (up or down) over the past two years? (In pounds) 0 oz (0 kg) Have you ever had a sleep study before? No Do you currently use CPAP? No Do you wear oxygen at any time? No    Review of Systems  Constitutional: Negative for fever and unexpected weight change.  HENT: Negative for ear pain, nosebleeds, congestion, sore throat, rhinorrhea, sneezing, trouble swallowing, dental problem, postnasal drip and sinus pressure.   Eyes: Negative for redness and itching.  Respiratory: Negative for cough, chest tightness,  shortness of breath and wheezing.   Cardiovascular: Negative for palpitations and leg swelling.  Gastrointestinal: Negative for nausea and vomiting.  Genitourinary: Negative for dysuria.  Musculoskeletal: Negative for joint swelling.  Skin: Negative for rash.  Neurological: Negative for headaches.  Hematological: Does not bruise/bleed easily.  Psychiatric/Behavioral: Positive for dysphoric mood. The patient is nervous/anxious.        Objective:   Physical Exam Constitutional:  Overweight male, no acute distress  HENT:  Nares patent without discharge, but large turbinates.  Oropharynx without exudate, palate and uvula are elongated and thickened.   Eyes:  Perrla, eomi, no scleral icterus  Neck:  No JVD, no TMG  Cardiovascular:  Normal rate, regular rhythm, no rubs or gallops.  No murmurs        Intact distal pulses  Pulmonary :  Normal breath sounds, no stridor or respiratory distress   No rales, rhonchi, or wheezing  Abdominal:  Soft, nondistended, bowel sounds present.  No tenderness noted.   Musculoskeletal:  No lower extremity edema noted.  Lymph Nodes:  No cervical lymphadenopathy noted  Skin:  No cyanosis noted  Neurologic:  Mildly sleepy, appropriate, moves all 4 extremities without obvious deficit.         Assessment & Plan:

## 2011-11-23 NOTE — Assessment & Plan Note (Signed)
The patient has significant sleep disruption and daytime hypersomnia but I suspect is related to obstructive sleep apnea.  However, I also suspect he has significant anxiety that feeds into all of this.  He also has very poor sleep hygiene.  I had a long discussion with him about sleep apnea, including its impacted his quality of life and cardiovascular health.  I think he needs a sleep study to evaluate for sleep disordered breathing, but also to look at other causes of frequent awakenings that may be related to a sleep disorder.

## 2011-12-28 ENCOUNTER — Ambulatory Visit (HOSPITAL_BASED_OUTPATIENT_CLINIC_OR_DEPARTMENT_OTHER): Payer: 59 | Attending: Pulmonary Disease

## 2011-12-28 VITALS — Ht 69.0 in | Wt 205.0 lb

## 2012-01-01 ENCOUNTER — Ambulatory Visit (INDEPENDENT_AMBULATORY_CARE_PROVIDER_SITE_OTHER): Payer: 59 | Admitting: Family Medicine

## 2012-01-01 ENCOUNTER — Encounter: Payer: Self-pay | Admitting: Family Medicine

## 2012-01-01 VITALS — BP 150/90 | HR 60 | Temp 98.2°F | Wt 210.0 lb

## 2012-01-01 DIAGNOSIS — R0602 Shortness of breath: Secondary | ICD-10-CM

## 2012-01-01 DIAGNOSIS — F419 Anxiety disorder, unspecified: Secondary | ICD-10-CM

## 2012-01-01 DIAGNOSIS — G4700 Insomnia, unspecified: Secondary | ICD-10-CM

## 2012-01-01 DIAGNOSIS — R0600 Dyspnea, unspecified: Secondary | ICD-10-CM | POA: Insufficient documentation

## 2012-01-01 DIAGNOSIS — F411 Generalized anxiety disorder: Secondary | ICD-10-CM

## 2012-01-01 DIAGNOSIS — R0609 Other forms of dyspnea: Secondary | ICD-10-CM

## 2012-01-01 NOTE — Patient Instructions (Signed)
Good to see you. Please by to see [name] on your way out.

## 2012-01-01 NOTE — Progress Notes (Signed)
Subjective:    Patient ID: Martin Downs, male    DOB: 18-Dec-1965, 46 y.o.   MRN: [id number]  HPI The patient is a 46 year old male here for persistent SOB and difficulty sleeping.  Referred to Dr. Shelle Iron in 10/2011 to rule out OSA- note reviewed.   Per Dr. Teddy Spike note, he agreed that he likely had some OSA in addition to significant anxiety.  Had sleep study done last week- per pt, incomplete study- I am still awaiting formal results.  Has been on multiple anxiolytics, currently taking Buspar.  Went to ER in 10/2011 with similar complaint, all studies were neg and as follows:   Labs:  Results for orders placed during the hospital encounter of 11/03/11   CBC   Component  Value  Range    WBC  5.6  4.0 - 10.5 (K/uL)    RBC  4.19 (*)  4.22 - 5.81 (MIL/uL)    Hemoglobin  12.7 (*)  13.0 - 17.0 (g/dL)    HCT  40.9 (*)  81.1 - 52.0 (%)    MCV  90.5  78.0 - 100.0 (fL)    MCH  30.3  26.0 - 34.0 (pg)    MCHC  33.5  30.0 - 36.0 (g/dL)    RDW  91.4  78.2 - 95.6 (%)    Platelets  228  150 - 400 (K/uL)   POCT I-STAT, CHEM 8   Component  Value  Range    Sodium  139  135 - 145 (mEq/L)    Potassium  4.0  3.5 - 5.1 (mEq/L)    Chloride  103  96 - 112 (mEq/L)    BUN  11  6 - 23 (mg/dL)    Creatinine, Ser  2.13  0.50 - 1.35 (mg/dL)    Glucose, Bld  086 (*)  70 - 99 (mg/dL)    Calcium, Ion  5.78  1.12 - 1.32 (mmol/L)    TCO2  25  0 - 100 (mmol/L)    Hemoglobin  12.9 (*)  13.0 - 17.0 (g/dL)    HCT  46.9 (*)  62.9 - 52.0 (%)   Imaging Studies:  Dg Chest 2 View  11/03/2011 *RADIOLOGY REPORT* Clinical Data: Shortness of breath CHEST - 2 VIEW Comparison: None. Findings: Lungs are essentially clear. No pleural effusion or pneumothorax. Cardiomediastinal silhouette is within normal limits. Mild degenerative changes of the visualized thoracolumbar spine. IMPRESSION: No evidence of acute cardiopulmonary disease. Original Report Authenticated By: Charline Bills, M.D.   EKG Interpretation:  Date &  Time: 11/03/2011 5:02 AM  Rate: 63  Rhythm: normal sinus rhythm  QRS Axis: normal  Intervals: normal  ST/T Wave abnormalities: normal  Conduction Disutrbances:nonspecific intraventricular conduction delay  Narrative Interpretation:  Old EKG Reviewed: none available  Pt denies any CP. Does endorse that SOB is slightly worse with exertion.  He is currently on Methadone for narcotics dependence.  Patient Active Problem List  Diagnoses  . HERPES LABIALIS  . HYPOTHYROIDISM  . THYROID STIMULATING HORMONE, ABNORMAL  . DIABETES-TYPE 2  . TESTICULAR HYPOFUNCTION  . PURE HYPERCHOLESTEROLEMIA  . HYPERLIPIDEMIA  . GOUT  . TOBACCO ABUSE  . DEPRESSION, SITUATIONAL  . KNEE PAIN, LEFT  . FOOT PAIN, LEFT  . INSOMNIA  . FATIGUE  . SHORTNESS OF BREATH  . GENERALIZED ANXIETY DISORDER  . EAR PAIN, RIGHT  . UPPER RESPIRATORY INFECTION, ACUTE  . Narcotic dependence  . Chronic pain  . Depression  . Anxiety  . Hypersomnia  . Dyspnea  Past Medical History  Diagnosis Date  . Hyperlipidemia   . Sleep apnea   . Borderline diabetes   . Anxiety   . Chronic pain   . Depression   . Narcotic dependence    Past Surgical History  Procedure Date  . Removal of bone spur    History  Substance Use Topics  . Smoking status: Never Smoker   . Smokeless tobacco: Current User    Types: Chew  . Alcohol Use: Not on file   Family History  Problem Relation Age of Onset  . Heart disease Father   . Cancer Father     unsure what kind   Allergies  Allergen Reactions  . Gemfibrozil     REACTION: diarrhea   Current Outpatient Prescriptions on File Prior to Visit  Medication Sig Dispense Refill  . aspirin 81 MG tablet Take 81 mg by mouth daily.        . busPIRone (BUSPAR) 15 MG tablet Take 0.5 tablets (7.5 mg total) by mouth 3 (three) times daily.  90 tablet  1  . colchicine 0.6 MG tablet Take 0.6 mg by mouth daily as needed. For gout flare ups Takes 1 tab daily until symptoms subsides        . fenofibrate micronized (LOFIBRA) 134 MG capsule Take 1 capsule (134 mg total) by mouth daily before breakfast.  30 capsule  6  . fluticasone (FLONASE) 50 MCG/ACT nasal spray Place 2 sprays into the nose daily as needed.      . methadone (DOLOPHINE) 10 MG/ML solution Take 70 mg by mouth daily.       . pioglitazone-metformin (ACTOPLUS MET) 15-850 MG per tablet Take 1 tablet by mouth daily.  30 tablet  6  . rosuvastatin (CRESTOR) 10 MG tablet Take 1 tablet (10 mg total) by mouth daily.  30 tablet  6  . valACYclovir (VALTREX) 500 MG tablet Take 500 mg by mouth 2 (two) times daily as needed. For cold sore flare ups Takes twice daily until cold sore clears up       The PMH, PSH, Social History, Family History, Medications, and allergies have been reviewed in [facility], and have been updated if relevant.    Review of Systems  Constitutional: Negative for fever and unexpected weight change.  HENT: Negative for ear pain, nosebleeds, congestion, sore throat, rhinorrhea, sneezing, trouble swallowing, dental problem, postnasal drip and sinus pressure.   Eyes: Negative for redness and itching.  Respiratory: Negative for cough, chest tightness, shortness of breath and wheezing.   Cardiovascular: Negative for palpitations and leg swelling.  Gastrointestinal: Negative for nausea and vomiting.  Genitourinary: Negative for dysuria.  Musculoskeletal: Negative for joint swelling.  Skin: Negative for rash.  Neurological: Negative for headaches.  Hematological: Does not bruise/bleed easily.  Psychiatric/Behavioral: Positive for dysphoric mood. The patient is nervous/anxious.        Objective:   Physical Exam Constitutional:  Overweight male, no acute distress  HENT:  Nares patent without discharge, but large turbinates.  Oropharynx without exudate, palate and uvula are elongated and thickened.   Eyes:  Perrla, eomi, no scleral icterus  Neck:  No JVD, no TMG  Cardiovascular:  Normal rate, regular  rhythm, no rubs or gallops.  No murmurs        Intact distal pulses  Pulmonary :  Normal breath sounds, no stridor or respiratory distress   No rales, rhonchi, or wheezing  Abdominal:  Soft, nondistended, bowel sounds present.  No tenderness noted.  Musculoskeletal:  No lower extremity edema noted.  Lymph Nodes:  No cervical lymphadenopathy noted  Skin:  No cyanosis noted  Neurologic:  Mildly sleepy, appropriate, moves all 4 extremities without obvious deficit.     Assessment & Plan:   1. INSOMNIA  Unchanged- still awaiting recs from pulmonary. Ambulatory referral to Pulmonology  2. Anxiety  Remains poorly controlled.  Unfortunately I have tried multiple times to refer him to psychiatry/psychotherapy and pt cancels appointments.   3. Dyspnea  Unchanged- likely multifactorial.  I still feel OSA is playing a roll as well as anxiety. Will refer back to pulmonary for further work up.  If neg, consider cardiac stress test. The patient indicates understanding of these issues and agrees with the plan.  Ambulatory referral to Pulmonology

## 2012-01-03 ENCOUNTER — Telehealth: Payer: Self-pay | Admitting: Pulmonary Disease

## 2012-01-03 NOTE — Telephone Encounter (Signed)
Martin Downs, this is the pt who left the sleep center in a panic.  Please see if he is willing to go back to evaluate his sleep.  We could consider something for his anxiety the night he goes to the sleep center.  The other option is a home sleep test, but it is not as accurate for his particular complaints.

## 2012-01-07 NOTE — Telephone Encounter (Signed)
LMOMTCB x1 for pt 

## 2012-01-09 NOTE — Telephone Encounter (Signed)
Pt states he doesn't think he can sleep with all of the wires hooked up to him at the sleep center, not even if he takes something for the anxiety. He is scheduled for follow-up on 01/15/12 @ 4pm and wants to discuss this further at that time. Will forward back to [facility] so he is aware.

## 2012-01-14 ENCOUNTER — Telehealth: Payer: Self-pay | Admitting: Family Medicine

## 2012-01-14 NOTE — Telephone Encounter (Signed)
Message copied by Dianne Dun on Mon Jan 14, 2012  7:31 AM ------      Message from: McLean, Maree Krabbe      Created: Fri Jan 11, 2012  6:51 PM       Wanted to let you know this pt went to the sleep center, and at the beginning had a panic attack and left immediately.  I have called him to consider rescheduling, and giving him something for anxiety ahead of time, but he did not want to do this.  I also offered home sleep testing to r/o OSA, but he did not want to do this either.  Let me know if I can help in any way.  Thanks.

## 2012-01-14 NOTE — Telephone Encounter (Signed)
Noted! Thank you

## 2012-01-15 ENCOUNTER — Encounter: Payer: Self-pay | Admitting: Pulmonary Disease

## 2012-01-15 ENCOUNTER — Ambulatory Visit (INDEPENDENT_AMBULATORY_CARE_PROVIDER_SITE_OTHER): Payer: 59 | Admitting: Pulmonary Disease

## 2012-01-15 ENCOUNTER — Telehealth: Payer: Self-pay | Admitting: Pulmonary Disease

## 2012-01-15 VITALS — BP 122/88 | HR 77 | Temp 98.4°F | Ht 69.0 in | Wt 211.4 lb

## 2012-01-15 DIAGNOSIS — R0600 Dyspnea, unspecified: Secondary | ICD-10-CM

## 2012-01-15 DIAGNOSIS — G4710 Hypersomnia, unspecified: Secondary | ICD-10-CM

## 2012-01-15 NOTE — Progress Notes (Signed)
  Subjective:    Patient ID: Martin Downs, male    DOB: [birth year], 46 y.o.   MRN: [id number]  HPI The patient comes in today for followup of his nocturnal dyspnea.  He was felt to possibly have obstructive sleep apnea versus panic/anxiety, and was scheduled for a sleep study.  He was unable to do this because of a panic attack, and left the sleep Center without completing.  I have discussed with him the possibility of sleep testing, and he is willing to try this, but he would like to address his problem of dyspnea at rest and with exertion during the day.  Review of Systems  Constitutional: Negative.  Negative for fever and unexpected weight change.  HENT: Negative.  Negative for ear pain, nosebleeds, congestion, sore throat, rhinorrhea, sneezing, trouble swallowing, dental problem, postnasal drip and sinus pressure.   Eyes: Negative.  Negative for redness and itching.  Respiratory: Positive for shortness of breath. Negative for cough, chest tightness and wheezing.   Cardiovascular: Negative.  Negative for palpitations and leg swelling.  Gastrointestinal: Negative for nausea and vomiting.  Genitourinary: Negative.  Negative for dysuria.  Musculoskeletal: Negative.  Negative for joint swelling.  Skin: Negative.  Negative for rash.  Neurological: Negative.  Negative for headaches.  Hematological: Negative.  Does not bruise/bleed easily.  Psychiatric/Behavioral: Negative for dysphoric mood. The patient is nervous/anxious.        Objective:   Physical Exam Overweight male in no acute distress Nose without purulence or discharge noted Chest clear to auscultation Extremities without edema, cyanosis Alert and oriented, moves all 4 extremities.       Assessment & Plan:

## 2012-01-15 NOTE — Assessment & Plan Note (Signed)
The patient has been having issues with nocturnal dyspnea, which subsequently leads to daytime hypersomnia.  It is unclear whether this is related to sleep-disordered breathing, his poor sleep hygiene, or severe anxiety and panic.  I have suggested a home sleep test to at least exclude significant sleep apnea, and he may be willing to do this after his workup for dyspnea on exertion during the day.

## 2012-01-15 NOTE — Telephone Encounter (Signed)
Previously offered 6/28 appt for PFT and pulmonary consult with [facility] but had to change the appt per [facility]. [facility] asked that I change this appt to a different date. Pt wants the appts on the same day and the first available will be July 31st 2 3pm for PFT and 4:15pm consult with [facility]. Pt is aware and agrees to this.

## 2012-01-15 NOTE — Patient Instructions (Addendum)
Consider doing the home sleep test to evaluate for sleep apnea Will schedule you for a slot soon to evaluate your shortness of breath during the daytime hours.  Will schedule you for breathing tests the same day so we can discuss.

## 2012-01-15 NOTE — Assessment & Plan Note (Signed)
The patient has been having issues with dyspnea on exertion, but also shortness of breath at rest.  He feels that he is unable to take a deep breath.  He wishes to have this evaluated before we go any further with his nocturnal dyspnea.  We'll arrange for a 30 minute time slot to evaluate from a pulmonary standpoint, and we'll schedule for pulmonary function studies prior to the visit.  He has had a recent chest x-ray that was unremarkable.

## 2012-02-27 ENCOUNTER — Institutional Professional Consult (permissible substitution): Payer: 59 | Admitting: Pulmonary Disease

## 2012-03-03 ENCOUNTER — Other Ambulatory Visit: Payer: Self-pay | Admitting: Family Medicine

## 2012-04-04 ENCOUNTER — Institutional Professional Consult (permissible substitution): Payer: 59 | Admitting: Pulmonary Disease

## 2012-06-03 ENCOUNTER — Telehealth: Payer: Self-pay

## 2012-06-03 NOTE — Telephone Encounter (Signed)
P left v/m requesting lab order for A1C.Please advise.

## 2012-06-04 NOTE — Telephone Encounter (Signed)
Ok to check a1c but he is overdue for follow up as well.

## 2012-06-04 NOTE — Telephone Encounter (Signed)
Advised patient.  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  He said they did a finger stick A1C and it was above 11, they told him it needed to be less than 10 without insulin.  He says he has been off his diabetes medicine for a couple of months due to cost. He has started back on this.  He asked if he can come in in one month for A1C but I told him it may take longer than that to get his number down due to being off the medicine.  He then asked if he can come in in a month to have a finger stick blood glucose checked to see if he can pass the CDL test.  Please advise.

## 2012-06-04 NOTE — Telephone Encounter (Signed)
Yes ok to come in one month for finger stick.

## 2012-06-05 NOTE — Telephone Encounter (Signed)
Left message asking patient to call back

## 2012-07-03 ENCOUNTER — Other Ambulatory Visit: Payer: 59

## 2012-08-06 NOTE — Telephone Encounter (Signed)
Can this encounter be closed?

## 2012-08-20 ENCOUNTER — Other Ambulatory Visit: Payer: Self-pay | Admitting: Family Medicine

## 2012-10-02 ENCOUNTER — Other Ambulatory Visit (INDEPENDENT_AMBULATORY_CARE_PROVIDER_SITE_OTHER): Payer: 59

## 2012-10-02 ENCOUNTER — Other Ambulatory Visit: Payer: Self-pay | Admitting: Family Medicine

## 2012-10-02 DIAGNOSIS — E1059 Type 1 diabetes mellitus with other circulatory complications: Secondary | ICD-10-CM

## 2012-10-02 MED ORDER — PIOGLITAZONE HCL-METFORMIN HCL 15-850 MG PO TABS: 1.0000 | ORAL_TABLET | Freq: Every day | ORAL | Status: DC

## 2012-10-02 NOTE — Addendum Note (Signed)
Addended by: Eliezer Bottom on: 10/02/2012 05:04 PM   Modules accepted: Orders

## 2012-10-06 NOTE — Telephone Encounter (Signed)
Opened in error

## 2012-12-25 ENCOUNTER — Other Ambulatory Visit: Payer: Self-pay | Admitting: Family Medicine

## 2013-02-26 ENCOUNTER — Other Ambulatory Visit (INDEPENDENT_AMBULATORY_CARE_PROVIDER_SITE_OTHER): Payer: 59

## 2013-02-26 DIAGNOSIS — E119 Type 2 diabetes mellitus without complications: Secondary | ICD-10-CM

## 2013-02-26 LAB — COMPREHENSIVE METABOLIC PANEL
AST: 21 U/L (ref 0–37)
Alkaline Phosphatase: 56 U/L (ref 39–117)
BUN: 15 mg/dL (ref 6–23)
Creatinine, Ser: 0.8 mg/dL (ref 0.4–1.5)

## 2013-03-02 ENCOUNTER — Other Ambulatory Visit: Payer: 59

## 2013-03-03 ENCOUNTER — Encounter: Payer: Self-pay | Admitting: Family Medicine

## 2013-03-03 ENCOUNTER — Ambulatory Visit (INDEPENDENT_AMBULATORY_CARE_PROVIDER_SITE_OTHER): Payer: 59 | Admitting: Family Medicine

## 2013-03-03 VITALS — BP 120/80 | HR 80 | Temp 98.3°F | Ht 69.0 in | Wt 198.0 lb

## 2013-03-03 DIAGNOSIS — E039 Hypothyroidism, unspecified: Secondary | ICD-10-CM

## 2013-03-03 DIAGNOSIS — E785 Hyperlipidemia, unspecified: Secondary | ICD-10-CM

## 2013-03-03 DIAGNOSIS — F411 Generalized anxiety disorder: Secondary | ICD-10-CM

## 2013-03-03 DIAGNOSIS — G8929 Other chronic pain: Secondary | ICD-10-CM

## 2013-03-03 DIAGNOSIS — E119 Type 2 diabetes mellitus without complications: Secondary | ICD-10-CM

## 2013-03-03 LAB — LIPID PANEL
Cholesterol: 193 mg/dL (ref 0–200)
HDL: 38.4 mg/dL — ABNORMAL LOW (ref >39.00)
Total CHOL/HDL Ratio: 5
Triglycerides: 453 mg/dL — ABNORMAL HIGH (ref 0.0–149.0)
VLDL: 90.6 mg/dL — ABNORMAL HIGH (ref 0.0–40.0)

## 2013-03-03 MED ORDER — GLIPIZIDE 5 MG PO TABS: 5.0000 mg | ORAL_TABLET | Freq: Two times a day (BID) | ORAL | Status: DC

## 2013-03-03 NOTE — Progress Notes (Signed)
47 yo Downs with h/o depression, chronic pain, hypothyroidism, DM, HLD who has not been here in over a year here for follow up.  H/o narcotic dependence, I no longer refill his narcotics.  On methadone, seen at [facility].  DM- a1c remains extremely high.  Was elevated in March and advised to increase Acto Met Plus to twice daily.   He did not keep follow up appt. States that he has been taking his medication every day.  Denies increased thirst or urination.  Stopped checking his CBGs.  Has cut back on sugars and lost weight. Has not had an eye exam this past year.  Lab Results  Component Value Date   HGBA1C 12.9* 02/26/2013   Wt Readings from Last 3 Encounters:  03/03/13 198 lb (89.812 kg)  01/15/12 211 lb 6.4 oz (95.89 kg)  01/01/12 210 lb (95.255 kg)   HLD- taking crestor 10 mg daily daily.  Depression- stable off rx.  Patient Active Problem List   Diagnosis Date Noted  . Dyspnea 01/01/2012  . Hypersomnia 11/23/2011  . Anxiety 11/05/2011  . Depression 08/23/2011  . Chronic pain 06/27/2011  . Narcotic dependence 11/16/2010  . GENERALIZED ANXIETY DISORDER 07/26/2010  . SHORTNESS OF BREATH 04/18/2010  . HYPOTHYROIDISM 01/17/2010  . TESTICULAR HYPOFUNCTION 11/07/2009  . HERPES LABIALIS 11/03/2009  . PURE HYPERCHOLESTEROLEMIA 05/26/2009  . THYROID STIMULATING HORMONE, ABNORMAL 04/06/2009  . FATIGUE 03/30/2009  . TOBACCO ABUSE 12/30/2008  . DIABETES-TYPE 2 09/05/2007  . HYPERLIPIDEMIA 12/04/2006  . GOUT 12/04/2006  . INSOMNIA 12/04/2006   Past Medical History  Diagnosis Date  . Hyperlipidemia   . Sleep apnea   . Borderline diabetes   . Anxiety   . Chronic pain   . Depression   . Narcotic dependence    Past Surgical History  Procedure Laterality Date  . Removal of bone spur     History  Substance Use Topics  . Smoking status: Never Smoker   . Smokeless tobacco: Current User    Types: Snuff  . Alcohol Use: Not on file   Family History  Problem Relation Age of  Onset  . Heart disease Father   . Cancer Father     unsure what kind   Allergies  Allergen Reactions  . Gemfibrozil     REACTION: diarrhea   Current Outpatient Prescriptions on File Prior to Visit  Medication Sig Dispense Refill  . aspirin 81 MG tablet Take 81 mg by mouth daily.        . colchicine 0.6 MG tablet Take 0.6 mg by mouth daily as needed. For gout flare ups Takes 1 tab daily until symptoms subsides      . CRESTOR 10 MG tablet TAKE 1 TABLET BY MOUTH EVERY DAY  30 tablet  5  . fenofibrate micronized (LOFIBRA) 134 MG capsule TAKE 1 CAPSULE (134 MG TOTAL) BY MOUTH DAILY BEFORE BREAKFAST.  30 capsule  5  . fluticasone (FLONASE) 50 MCG/ACT nasal spray Place 2 sprays into the nose daily as needed.      . fluticasone (FLONASE) 50 MCG/ACT nasal spray USE 2 SPRAYS IN EACH NOSTRIL DAILY  16 g  1  . methadone (DOLOPHINE) 10 MG/ML solution Take 70 mg by mouth daily.       . pioglitazone-metformin (ACTOPLUS MET) 15-850 MG per tablet TAKE ONE TABLET BY MOUTH TWICE A DAY.  60 tablet  1  . valACYclovir (VALTREX) 500 MG tablet Take 500 mg by mouth 2 (two) times daily as needed. For cold  sore flare ups Takes twice daily until cold sore clears up       No current facility-administered medications on file prior to visit.     The PMH, PSH, Social History, Family History, Medications, and allergies have been reviewed in [facility], and have been updated if relevant.   Review of Systems       See HPI   Psych:  Complains of panic attacks; denies sense of great danger and suicidal thoughts/plans.  Physical Exam BP 120/80  Pulse 80  Temp(Src) 98.3 F (36.8 C)  Ht 5\' 9"  (1.753 m)  Wt 198 lb (89.812 kg)  BMI 29.23 kg/m2  General:  alert, well-developed, diaphoretic. Lungs:  Normal respiratory effort, chest expands symmetrically. Lungs are clear to auscultation, no crackles or wheezes. Heart:  Normal rate and regular rhythm. S1 and S2 normal without gallop, murmur, click, rub or other extra  sounds. Psych:  slightly anxious and tearful, otherwise normal affect.Oriented X3 and normally interactive  Assessment and Plan: 1. Generalized anxiety disorder States he is doing well off rx.  2. Chronic pain Stable.  3. DIABETES-TYPE 2 Deteriorated.  Add Glipizide 5 mg twice daily. Explained importance of checking CBGs and following up in 3 months. May need to start insulin and refer to endocrinology but he prefers to try another medication first.  4. HYPERLIPIDEMIA  - Lipid Panel

## 2013-03-03 NOTE — Patient Instructions (Addendum)
We are adding Glipizide 5 mg twice daily to your current diabetes medication. Please follow up in 3 months.  Start rechecking your sugars.

## 2013-03-23 ENCOUNTER — Other Ambulatory Visit: Payer: Self-pay | Admitting: Family Medicine

## 2013-04-03 ENCOUNTER — Other Ambulatory Visit (INDEPENDENT_AMBULATORY_CARE_PROVIDER_SITE_OTHER): Payer: 59

## 2013-04-03 DIAGNOSIS — E781 Pure hyperglyceridemia: Secondary | ICD-10-CM

## 2013-04-03 LAB — LDL CHOLESTEROL, DIRECT: Direct LDL: 98.6 mg/dL

## 2013-04-03 LAB — LIPID PANEL: Total CHOL/HDL Ratio: 4

## 2013-04-06 ENCOUNTER — Encounter: Payer: Self-pay | Admitting: *Deleted

## 2013-05-07 ENCOUNTER — Other Ambulatory Visit: Payer: Self-pay | Admitting: Family Medicine

## 2013-05-13 ENCOUNTER — Encounter: Payer: Self-pay | Admitting: Family Medicine

## 2013-05-13 ENCOUNTER — Ambulatory Visit (INDEPENDENT_AMBULATORY_CARE_PROVIDER_SITE_OTHER): Payer: 59 | Admitting: Family Medicine

## 2013-05-13 VITALS — BP 110/68 | HR 78 | Temp 98.6°F | Wt 205.8 lb

## 2013-05-13 DIAGNOSIS — F329 Major depressive disorder, single episode, unspecified: Secondary | ICD-10-CM

## 2013-05-13 DIAGNOSIS — F3289 Other specified depressive episodes: Secondary | ICD-10-CM

## 2013-05-13 DIAGNOSIS — E785 Hyperlipidemia, unspecified: Secondary | ICD-10-CM

## 2013-05-13 DIAGNOSIS — F32A Depression, unspecified: Secondary | ICD-10-CM

## 2013-05-13 DIAGNOSIS — E119 Type 2 diabetes mellitus without complications: Secondary | ICD-10-CM

## 2013-05-13 DIAGNOSIS — E039 Hypothyroidism, unspecified: Secondary | ICD-10-CM

## 2013-05-13 LAB — BASIC METABOLIC PANEL
Calcium: 9.5 mg/dL (ref 8.4–10.5)
GFR: 80.67 mL/min (ref >60.00)
Glucose, Bld: 286 mg/dL — ABNORMAL HIGH (ref 70–99)
Sodium: 137 mEq/L (ref 135–145)

## 2013-05-13 LAB — TSH: TSH: 1.68 u[IU]/mL (ref 0.35–5.50)

## 2013-05-13 LAB — MICROALBUMIN / CREATININE URINE RATIO: Microalb Creat Ratio: 0.3 mg/g (ref 0.0–30.0)

## 2013-05-13 MED ORDER — GLIPIZIDE 10 MG PO TABS: 10.0000 mg | ORAL_TABLET | Freq: Two times a day (BID) | ORAL | Status: DC

## 2013-05-13 NOTE — Progress Notes (Signed)
47 yo male with h/o depression, chronic pain, hypothyroidism, DM, HLD here for 3 month follow up;  H/o narcotic dependence, I no longer refill his narcotics.  On methadone, seen at [facility].  DM- a1c remains extremely high.  Was elevated in March and advised to increase Acto Met Plus to twice daily.   He did not keep follow up appt. In August, had taking his medication every day but a1c remained elevated. Added Glipizide 5 mg twice daily and recommended endo referral but he preferred to try additional oral medication first and to follow up today.    Denies increased thirst or urination.  Still not checking his CBGs.  Has cut back on sugars and lost weight.  Lab Results  Component Value Date   HGBA1C 12.9* 02/26/2013   HLD- at goal for diabetic.  Taking Crestor daily. Lab Results  Component Value Date   CHOL 151 04/03/2013   HDL 38.20* 04/03/2013   LDLCALC 71 06/16/2010   LDLDIRECT 98.6 04/03/2013   TRIG 209.0* 04/03/2013   CHOLHDL 4 04/03/2013    Wt Readings from Last 3 Encounters:  05/13/13 205 lb 12 oz (93.328 kg)  03/03/13 198 lb (89.812 kg)  01/15/12 211 lb 6.4 oz (95.89 kg)    Depression- stable off rx.  Patient Active Problem List   Diagnosis Date Noted  . Hypersomnia 11/23/2011  . Anxiety 11/05/2011  . Depression 08/23/2011  . Chronic pain 06/27/2011  . Narcotic dependence 11/16/2010  . GENERALIZED ANXIETY DISORDER 07/26/2010  . HYPOTHYROIDISM 01/17/2010  . TESTICULAR HYPOFUNCTION 11/07/2009  . HERPES LABIALIS 11/03/2009  . PURE HYPERCHOLESTEROLEMIA 05/26/2009  . THYROID STIMULATING HORMONE, ABNORMAL 04/06/2009  . FATIGUE 03/30/2009  . TOBACCO ABUSE 12/30/2008  . DIABETES-TYPE 2 09/05/2007  . HYPERLIPIDEMIA 12/04/2006  . GOUT 12/04/2006  . INSOMNIA 12/04/2006   Past Medical History  Diagnosis Date  . Hyperlipidemia   . Sleep apnea   . Borderline diabetes   . Anxiety   . Chronic pain   . Depression   . Narcotic dependence    Past Surgical History  Procedure  Laterality Date  . Removal of bone spur     History  Substance Use Topics  . Smoking status: Never Smoker   . Smokeless tobacco: Current User    Types: Snuff  . Alcohol Use: Not on file   Family History  Problem Relation Age of Onset  . Heart disease Father   . Cancer Father     unsure what kind   Allergies  Allergen Reactions  . Gemfibrozil     REACTION: diarrhea   Current Outpatient Prescriptions on File Prior to Visit  Medication Sig Dispense Refill  . aspirin 81 MG tablet Take 81 mg by mouth daily.        . colchicine 0.6 MG tablet Take 0.6 mg by mouth daily as needed. For gout flare ups Takes 1 tab daily until symptoms subsides      . CRESTOR 10 MG tablet TAKE 1 TABLET BY MOUTH EVERY DAY  30 tablet  5  . fenofibrate micronized (LOFIBRA) 134 MG capsule TAKE 1 CAPSULE (134 MG TOTAL) BY MOUTH DAILY BEFORE BREAKFAST.  30 capsule  5  . fluticasone (FLONASE) 50 MCG/ACT nasal spray Place 2 sprays into the nose daily as needed.      . fluticasone (FLONASE) 50 MCG/ACT nasal spray USE 2 SPRAYS IN EACH NOSTRIL DAILY  16 g  5  . glipiZIDE (GLUCOTROL) 5 MG tablet Take 1 tablet (5 mg total) by  mouth 2 (two) times daily before a meal.  60 tablet  3  . Magnesium 250 MG TABS Take one by mouth daily      . Melatonin 10 MG TABS Take 2 by mouth at bedtime      . methadone (DOLOPHINE) 10 MG/ML solution Take 70 mg by mouth daily.       . pioglitazone-metformin (ACTOPLUS MET) 15-850 MG per tablet TAKE ONE TABLET BY MOUTH TWICE A DAY.  60 tablet  5  . Potassium Gluconate 595 MG CAPS Take one by mouth daily      . valACYclovir (VALTREX) 500 MG tablet Take 500 mg by mouth 2 (two) times daily as needed. For cold sore flare ups Takes twice daily until cold sore clears up      . vitamin B-12 (CYANOCOBALAMIN) 1000 MCG tablet Take 1,000 mcg by mouth daily.       No current facility-administered medications on file prior to visit.     The PMH, PSH, Social History, Family History, Medications, and  allergies have been reviewed in [facility], and have been updated if relevant.   Review of Systems       See HPI   Psych:  Complains of panic attacks; denies sense of great danger and suicidal thoughts/plans.  Physical Exam BP 110/68  Pulse 78  Temp(Src) 98.6 F (37 C) (Oral)  Wt 205 lb 12 oz (93.328 kg)  BMI 30.37 kg/m2  SpO2 98%  General:  alert, well-developed, pleasant Lungs:  Normal respiratory effort, chest expands symmetrically. Lungs are clear to auscultation, no crackles or wheezes. Heart:  Normal rate and regular rhythm. S1 and S2 normal without gallop, murmur, click, rub or other extra sounds. Psych:  slightly anxious and tearful, otherwise normal affect.Oriented X3 and normally interactive Diabetic foot exam: Normal inspection No skin breakdown No calluses  Normal DP pulses Normal sensation to light touch and monofilament Nails normal   Assessment and Plan:  1. DIABETES-TYPE 2 Recheck a1c today.  Encouraged him to make eye doctor appt.  Also advised exercise and continued weight loss and diet. Urine micro since he is not on an ACEI. Continue current rx. - HM Diabetes Foot Exam - Hemoglobin A1c - Basic Metabolic Panel - Microalbumin/Creatinine Ratio, Urine  2. HYPOTHYROIDISM  - TSH  3. Depression Stable off rx.  4. HYPERLIPIDEMIA Stable on current rx.

## 2013-05-13 NOTE — Patient Instructions (Signed)
Good to see you. We will call you with your lab results.   

## 2013-05-13 NOTE — Addendum Note (Signed)
Addended by: Dianne Dun on: 05/13/2013 01:13 PM   Modules accepted: Orders, Medications

## 2013-06-02 ENCOUNTER — Ambulatory Visit: Payer: 59 | Admitting: Family Medicine

## 2013-07-15 ENCOUNTER — Other Ambulatory Visit: Payer: Self-pay | Admitting: Family Medicine

## 2013-08-18 ENCOUNTER — Encounter: Payer: Self-pay | Admitting: Family Medicine

## 2013-08-18 ENCOUNTER — Ambulatory Visit: Payer: 59 | Admitting: Family Medicine

## 2013-08-18 ENCOUNTER — Ambulatory Visit (INDEPENDENT_AMBULATORY_CARE_PROVIDER_SITE_OTHER): Payer: 59 | Admitting: Family Medicine

## 2013-08-18 VITALS — BP 116/70 | HR 120 | Temp 98.3°F | Ht 68.0 in | Wt 196.5 lb

## 2013-08-18 DIAGNOSIS — R509 Fever, unspecified: Secondary | ICD-10-CM | POA: Insufficient documentation

## 2013-08-18 MED ORDER — DOXYCYCLINE HYCLATE 100 MG PO TABS: 100.0000 mg | ORAL_TABLET | Freq: Two times a day (BID) | ORAL | Status: DC

## 2013-08-18 NOTE — Assessment & Plan Note (Addendum)
?   Post influenza bronchitis/PNA.  Did not swab for influenza given duration of symptoms--outside window for tx with Tamiflu. S/p Zpack. Cannot give quinolones since he is on methadone. Doxycycline 100 mg twice daily x 10 days, continue supportive care. Call or return to clinic prn if these symptoms worsen or fail to improve as anticipated. The patient indicates understanding of these issues and agrees with the plan.

## 2013-08-18 NOTE — Progress Notes (Signed)
Pre-visit discussion using our clinic review tool. No additional management support is needed unless otherwise documented below in the visit note.  

## 2013-08-18 NOTE — Progress Notes (Signed)
Subjective:    Patient ID: Martin Downs, male    DOB: May 03, 1966, 48 y.o.   MRN: [id number]  HPI  48 yo here for fever.  Started very acute onset over 1 week ago- sweats, chills, myalgias, tmax 101.8.  Did not have his flu shot this year.  Took a zpack he had at home, symptoms did not really improve.  He is no longer febrile but runny nose, malaise have persisted and cough is now productive. No CP, No SOB.  Has been out of work for a week.  Patient Active Problem List   Diagnosis Date Noted  . Hypersomnia 11/23/2011  . Anxiety 11/05/2011  . Depression 08/23/2011  . Chronic pain 06/27/2011  . Narcotic dependence 11/16/2010  . GENERALIZED ANXIETY DISORDER 07/26/2010  . HYPOTHYROIDISM 01/17/2010  . TESTICULAR HYPOFUNCTION 11/07/2009  . HERPES LABIALIS 11/03/2009  . PURE HYPERCHOLESTEROLEMIA 05/26/2009  . THYROID STIMULATING HORMONE, ABNORMAL 04/06/2009  . FATIGUE 03/30/2009  . TOBACCO ABUSE 12/30/2008  . DIABETES-TYPE 2 09/05/2007  . HYPERLIPIDEMIA 12/04/2006  . GOUT 12/04/2006  . INSOMNIA 12/04/2006   Past Medical History  Diagnosis Date  . Hyperlipidemia   . Sleep apnea   . Borderline diabetes   . Anxiety   . Chronic pain   . Depression   . Narcotic dependence    Past Surgical History  Procedure Laterality Date  . Removal of bone spur     History  Substance Use Topics  . Smoking status: Never Smoker   . Smokeless tobacco: Current User    Types: Snuff  . Alcohol Use: Not on file   Family History  Problem Relation Age of Onset  . Heart disease Father   . Cancer Father     unsure what kind   Allergies  Allergen Reactions  . Gemfibrozil     REACTION: diarrhea   Current Outpatient Prescriptions on File Prior to Visit  Medication Sig Dispense Refill  . aspirin 81 MG tablet Take 81 mg by mouth daily.        . colchicine 0.6 MG tablet Take 0.6 mg by mouth daily as needed. For gout flare ups Takes 1 tab daily until symptoms subsides      .  CRESTOR 10 MG tablet TAKE 1 TABLET BY MOUTH EVERY DAY  30 tablet  5  . fenofibrate micronized (LOFIBRA) 134 MG capsule TAKE 1 CAPSULE (134 MG TOTAL) BY MOUTH DAILY BEFORE BREAKFAST.  30 capsule  5  . fluticasone (FLONASE) 50 MCG/ACT nasal spray Place 2 sprays into the nose daily as needed.      Marland Kitchen glipiZIDE (GLUCOTROL) 10 MG tablet Take 1 tablet (10 mg total) by mouth 2 (two) times daily before a meal.  60 tablet  3  . Magnesium 250 MG TABS Take one by mouth daily      . Melatonin 10 MG TABS 4 capsules. Take 2 by mouth at bedtime      . methadone (DOLOPHINE) 10 MG/ML solution Take 105 mg by mouth daily.       . pioglitazone-metformin (ACTOPLUS MET) 15-850 MG per tablet TAKE ONE TABLET BY MOUTH TWICE A DAY.  60 tablet  5  . Potassium Gluconate 595 MG CAPS Take one by mouth daily      . valACYclovir (VALTREX) 500 MG tablet Take 500 mg by mouth 2 (two) times daily as needed. For cold sore flare ups Takes twice daily until cold sore clears up      . vitamin B-12 (CYANOCOBALAMIN) 1000  MCG tablet Take 1,000 mcg by mouth daily.       No current facility-administered medications on file prior to visit.   The PMH, PSH, Social History, Family History, Medications, and allergies have been reviewed in [facility], and have been updated if relevant.   Review of Systems    See HPI No n/v/d Objective:   Physical Exam BP 116/70  Pulse 120  Temp(Src) 98.3 F (36.8 C) (Oral)  Ht $R'5\' 8"'RT$  (1.727 m)  Wt 196 lb 8 oz (89.132 kg)  BMI 29.88 kg/m2  SpO2 96% Gen:  Alert, sweating but no acute distress HEENT: tms clear bilaterally,  Clear rhinorrhea Resp:  No increased WOB +ronchi LLL otherwise clear       Assessment & Plan:

## 2013-08-18 NOTE — Patient Instructions (Signed)
Good to see you. Please take doxycycline as directed- 1 tablet twice daily x 10 days. Ok to continue Mucinex, tylenol and Advil as needed.

## 2013-08-24 ENCOUNTER — Telehealth: Payer: Self-pay | Admitting: Family Medicine

## 2013-08-24 NOTE — Telephone Encounter (Signed)
Pt dropped off FMLA forms. I put them in Dr. Elmer SowAron's inbox on her desk.

## 2013-08-24 NOTE — Telephone Encounter (Signed)
Please disregard gave forms to Rose up front

## 2013-08-26 ENCOUNTER — Telehealth: Payer: Self-pay | Admitting: Family Medicine

## 2013-08-26 NOTE — Telephone Encounter (Signed)
Encounter opened in error

## 2013-08-27 ENCOUNTER — Telehealth: Payer: Self-pay | Admitting: Family Medicine

## 2013-08-27 DIAGNOSIS — Z0279 Encounter for issue of other medical certificate: Secondary | ICD-10-CM

## 2013-08-27 NOTE — Telephone Encounter (Signed)
[facility]

## 2013-08-27 NOTE — Telephone Encounter (Signed)
Pt dropped off FMLA forms to be completed.  I have completed these forms w/the exception of a couple of areas which are flagged.  Please pass the forms on to Dr. Dayton MartesAron to review/complete the flagged areas/sign.  I have placed them in a yellow folder which is on your desk.  Please return to me once complete. Thank you.

## 2013-08-28 NOTE — Telephone Encounter (Signed)
Notified pt forms are at front and ready for p/u

## 2013-10-05 ENCOUNTER — Telehealth: Payer: Self-pay | Admitting: *Deleted

## 2013-10-05 MED ORDER — FENOFIBRATE 160 MG PO TABS: 160.0000 mg | ORAL_TABLET | Freq: Every day | ORAL | Status: DC

## 2013-10-05 NOTE — Telephone Encounter (Signed)
Received a fax from pharmacy, fenofibrate 134 caps is not covered by insurance. They will cover 54 mg or 160 mg. Are either of those appropriate for pt?

## 2013-10-05 NOTE — Telephone Encounter (Signed)
New rx for 160mg  sent to pharmacy

## 2013-10-05 NOTE — Telephone Encounter (Signed)
Yes ok to change to 160 mg daily- please also update med list.

## 2013-11-06 ENCOUNTER — Other Ambulatory Visit: Payer: Self-pay | Admitting: Family Medicine

## 2013-12-09 ENCOUNTER — Other Ambulatory Visit: Payer: Self-pay | Admitting: Family Medicine

## 2014-01-08 ENCOUNTER — Other Ambulatory Visit: Payer: Self-pay | Admitting: Family Medicine

## 2014-02-11 ENCOUNTER — Telehealth: Payer: Self-pay | Admitting: Family Medicine

## 2014-02-11 ENCOUNTER — Ambulatory Visit (INDEPENDENT_AMBULATORY_CARE_PROVIDER_SITE_OTHER): Payer: 59 | Admitting: Family Medicine

## 2014-02-11 ENCOUNTER — Other Ambulatory Visit: Payer: Self-pay | Admitting: Family Medicine

## 2014-02-11 ENCOUNTER — Encounter: Payer: Self-pay | Admitting: Family Medicine

## 2014-02-11 VITALS — BP 114/66 | HR 77 | Temp 98.3°F | Ht 68.0 in | Wt 207.0 lb

## 2014-02-11 DIAGNOSIS — Z8249 Family history of ischemic heart disease and other diseases of the circulatory system: Secondary | ICD-10-CM | POA: Insufficient documentation

## 2014-02-11 DIAGNOSIS — R0989 Other specified symptoms and signs involving the circulatory and respiratory systems: Secondary | ICD-10-CM

## 2014-02-11 DIAGNOSIS — E039 Hypothyroidism, unspecified: Secondary | ICD-10-CM

## 2014-02-11 DIAGNOSIS — E785 Hyperlipidemia, unspecified: Secondary | ICD-10-CM

## 2014-02-11 DIAGNOSIS — R0600 Dyspnea, unspecified: Secondary | ICD-10-CM | POA: Insufficient documentation

## 2014-02-11 DIAGNOSIS — E119 Type 2 diabetes mellitus without complications: Secondary | ICD-10-CM

## 2014-02-11 DIAGNOSIS — E1165 Type 2 diabetes mellitus with hyperglycemia: Secondary | ICD-10-CM

## 2014-02-11 DIAGNOSIS — R0609 Other forms of dyspnea: Secondary | ICD-10-CM

## 2014-02-11 LAB — LIPID PANEL
CHOL/HDL RATIO: 4
CHOLESTEROL: 151 mg/dL (ref 0–200)
HDL: 42.2 mg/dL (ref >39.00)
LDL CALC: 73 mg/dL (ref 0–99)
NonHDL: 108.8
Triglycerides: 181 mg/dL — ABNORMAL HIGH (ref 0.0–149.0)
VLDL: 36.2 mg/dL (ref 0.0–40.0)

## 2014-02-11 LAB — HEMOGLOBIN A1C: Hgb A1c MFr Bld: 12.5 % — ABNORMAL HIGH (ref 4.6–6.5)

## 2014-02-11 LAB — TSH: TSH: 1.56 u[IU]/mL (ref 0.35–4.50)

## 2014-02-11 NOTE — Progress Notes (Signed)
48 yo male with h/o depression, chronic pain, hypothyroidism, DM, HLD here for 3 month follow up;  H/o narcotic dependence, I no longer refill his narcotics.  On methadone, seen at [facility].  DM- has not been compliant with follow up.   Not well controlled.  On Glucotrol 10 mg twice daily and Actoplus twice daily. Urine micro albumin neg in 04/2013.  Denies increased thirst or urination.  Still not checking his CBGs.  Has noticed that his more short of breath with exertion and sweats more.  He has a strong FH of CAD- father and brother had MIs.  He is not a smoker. Wt Readings from Last 3 Encounters:  02/11/14 207 lb (93.895 kg)  08/18/13 196 lb 8 oz (89.132 kg)  05/13/13 205 lb 12 oz (93.328 kg)     Lab Results  Component Value Date   HGBA1C 11.9* 05/13/2013   HLD- at goal for diabetic.  Taking Crestor daily. Lab Results  Component Value Date   CHOL 151 04/03/2013   HDL 38.20* 04/03/2013   LDLCALC 71 06/16/2010   LDLDIRECT 98.6 04/03/2013   TRIG 209.0* 04/03/2013   CHOLHDL 4 04/03/2013    Wt Readings from Last 3 Encounters:  02/11/14 207 lb (93.895 kg)  08/18/13 196 lb 8 oz (89.132 kg)  05/13/13 205 lb 12 oz (93.328 kg)    Depression- stable off rx.  Patient Active Problem List   Diagnosis Date Noted  . Fever, unspecified 08/18/2013  . Hypersomnia 11/23/2011  . Anxiety 11/05/2011  . Depression 08/23/2011  . Chronic pain 06/27/2011  . Narcotic dependence 11/16/2010  . GENERALIZED ANXIETY DISORDER 07/26/2010  . HYPOTHYROIDISM 01/17/2010  . TESTICULAR HYPOFUNCTION 11/07/2009  . HERPES LABIALIS 11/03/2009  . PURE HYPERCHOLESTEROLEMIA 05/26/2009  . THYROID STIMULATING HORMONE, ABNORMAL 04/06/2009  . FATIGUE 03/30/2009  . TOBACCO ABUSE 12/30/2008  . DIABETES-TYPE 2 09/05/2007  . HYPERLIPIDEMIA 12/04/2006  . GOUT 12/04/2006  . INSOMNIA 12/04/2006   Past Medical History  Diagnosis Date  . Hyperlipidemia   . Sleep apnea   . Borderline diabetes   . Anxiety   . Chronic  pain   . Depression   . Narcotic dependence    Past Surgical History  Procedure Laterality Date  . Removal of bone spur     History  Substance Use Topics  . Smoking status: Never Smoker   . Smokeless tobacco: Current User    Types: Snuff  . Alcohol Use: Not on file   Family History  Problem Relation Age of Onset  . Heart disease Father   . Cancer Father     unsure what kind   Allergies  Allergen Reactions  . Gemfibrozil     REACTION: diarrhea   Current Outpatient Prescriptions on File Prior to Visit  Medication Sig Dispense Refill  . aspirin 81 MG tablet Take 81 mg by mouth daily.        . colchicine 0.6 MG tablet Take 0.6 mg by mouth daily as needed. For gout flare ups Takes 1 tab daily until symptoms subsides      . CRESTOR 10 MG tablet TAKE 1 TABLET BY MOUTH EVERY DAY  30 tablet  5  . fenofibrate 160 MG tablet TAKE 1 TABLET (160 MG TOTAL) BY MOUTH DAILY.  30 tablet  0  . fluticasone (FLONASE) 50 MCG/ACT nasal spray USE 2 SPRAYS IN EACH NOSTRIL DAILY  16 g  1  . glipiZIDE (GLUCOTROL) 10 MG tablet TAKE 1 TABLET (10 MG TOTAL) BY MOUTH 2 (  TWO) TIMES DAILY BEFORE A MEAL.  60 tablet  0  . methadone (DOLOPHINE) 10 MG/ML solution Take 105 mg by mouth daily.       . pioglitazone-metformin (ACTOPLUS MET) 15-850 MG per tablet TAKE ONE TABLET BY MOUTH TWICE A DAY.  60 tablet  5  . valACYclovir (VALTREX) 500 MG tablet Take 500 mg by mouth 2 (two) times daily as needed. For cold sore flare ups Takes twice daily until cold sore clears up      . vitamin B-12 (CYANOCOBALAMIN) 1000 MCG tablet Take 1,000 mcg by mouth daily.       No current facility-administered medications on file prior to visit.     The PMH, PSH, Social History, Family History, Medications, and allergies have been reviewed in [facility], and have been updated if relevant.   Review of Systems       See HPI No CP +dizziness and SOB with exertion for past few months No LE edema  Psych:  Complains of panic attacks;  denies sense of great danger and suicidal thoughts/plans.  Physical Exam BP 114/66  Pulse 77  Temp(Src) 98.3 F (36.8 C) (Oral)  Ht $R'5\' 8"'iL$  (1.727 m)  Wt 207 lb (93.895 kg)  BMI 31.48 kg/m2  SpO2 95%  General:  alert, well-developed, pleasant Lungs:  Normal respiratory effort, chest expands symmetrically. Lungs are clear to auscultation, no crackles or wheezes. Heart:  Normal rate and regular rhythm. S1 and S2 normal without gallop, murmur, click, rub or other extra sounds. Ext:  No edema

## 2014-02-11 NOTE — Assessment & Plan Note (Signed)
Not well controlled. Recheck labs today. Orders Placed This Encounter  Procedures  . Hemoglobin A1c  . Lipid panel  . TSH  . Ambulatory referral to Cardiology    Referral Priority:  Routine    Referral Type:  Consultation    Referral Reason:  Specialty Services Required    Requested Specialty:  Cardiology    Number of Visits Requested:  1   May need endocrinology referral.

## 2014-02-11 NOTE — Telephone Encounter (Signed)
Relevant patient education mailed to patient.  

## 2014-02-11 NOTE — Progress Notes (Signed)
Pre visit review using our clinic review tool, if applicable. No additional management support is needed unless otherwise documented below in the visit note. 

## 2014-02-11 NOTE — Assessment & Plan Note (Signed)
Continue current rx. Check labs today. 

## 2014-02-11 NOTE — Patient Instructions (Signed)
Good to see you. We will call you with your lab results. Please stop by to see Shirlee LimerickMarion on your way out to set up your cardiology referral.

## 2014-02-11 NOTE — Assessment & Plan Note (Signed)
New with FH of CAD. Will refer to cards for evaluation/stress test. Referral placed.

## 2014-03-03 ENCOUNTER — Other Ambulatory Visit: Payer: Self-pay | Admitting: Family Medicine

## 2014-03-05 ENCOUNTER — Ambulatory Visit (INDEPENDENT_AMBULATORY_CARE_PROVIDER_SITE_OTHER): Payer: 59 | Admitting: Internal Medicine

## 2014-03-05 ENCOUNTER — Encounter: Payer: Self-pay | Admitting: Internal Medicine

## 2014-03-05 VITALS — BP 122/80 | HR 74 | Temp 98.6°F | Resp 12 | Ht 69.0 in | Wt 210.0 lb

## 2014-03-05 DIAGNOSIS — E119 Type 2 diabetes mellitus without complications: Secondary | ICD-10-CM

## 2014-03-05 MED ORDER — INSULIN PEN NEEDLE 32G X 4 MM MISC: Status: DC

## 2014-03-05 MED ORDER — INSULIN DETEMIR 100 UNIT/ML FLEXPEN: 30.0000 [IU] | PEN_INJECTOR | Freq: Every day | SUBCUTANEOUS | Status: DC

## 2014-03-05 MED ORDER — METFORMIN HCL 1000 MG PO TABS: 1000.0000 mg | ORAL_TABLET | Freq: Two times a day (BID) | ORAL | Status: DC

## 2014-03-05 NOTE — Patient Instructions (Signed)
Please continue Glipizide 10 mg 2x a day, before meals. Please stop ActoPlus. Start Metformin 1000 mg 2x a day with meals. Start Levemir 20 units at bedtime. If sugars in am not <150 after 4 days, increase to 25 units, and in 4 more days, if sugars in am not <150, increase to 30 units.  Please return in 1 month with your sugar log.   PATIENT INSTRUCTIONS FOR TYPE 2 DIABETES:  **Please join MyChart!** - see attached instructions about how to join if you have not done so already.  DIET AND EXERCISE Diet and exercise is an important part of diabetic treatment.  We recommended aerobic exercise in the form of brisk walking (working between 40-60% of maximal aerobic capacity, similar to brisk walking) for 150 minutes per week (such as 30 minutes five days per week) along with 3 times per week performing 'resistance' training (using various gauge rubber tubes with handles) 5-10 exercises involving the major muscle groups (upper body, lower body and core) performing 10-15 repetitions (or near fatigue) each exercise. Start at half the above goal but build slowly to reach the above goals. If limited by weight, joint pain, or disability, we recommend daily walking in a swimming pool with water up to waist to reduce pressure from joints while allow for adequate exercise.    BLOOD GLUCOSES Monitoring your blood glucoses is important for continued management of your diabetes. Please check your blood glucoses 2-4 times a day: fasting, before meals and at bedtime (you can rotate these measurements - e.g. one day check before the 3 meals, the next day check before 2 of the meals and before bedtime, etc.).   HYPOGLYCEMIA (low blood sugar) Hypoglycemia is usually a reaction to not eating, exercising, or taking too much insulin/ other diabetes drugs.  Symptoms include tremors, sweating, hunger, confusion, headache, etc. Treat IMMEDIATELY with 15 grams of Carbs:   4 glucose tablets    cup regular juice/soda   2  tablespoons raisins   4 teaspoons sugar   1 tablespoon honey Recheck blood glucose in 15 mins and repeat above if still symptomatic/blood glucose <100.  RECOMMENDATIONS TO REDUCE YOUR RISK OF DIABETIC COMPLICATIONS: * Take your prescribed MEDICATION(S) * Follow a DIABETIC diet: Complex carbs, fiber rich foods, (monounsaturated and polyunsaturated) fats * AVOID saturated/trans fats, high fat foods, >2,300 mg salt per day. * EXERCISE at least 5 times a week for 30 minutes or preferably daily.  * DO NOT SMOKE OR DRINK more than 1 drink a day. * Check your FEET every day. Do not wear tightfitting shoes. Contact us if you develop an ulcer * See your EYE doctor once a year or more if needed * Get a FLU shot once a year * Get a PNEUMONIA vaccine once before and once after age 50 years  GOALS:  * Your Hemoglobin A1c of <7%  * fasting sugars need to be <130 * after meals sugars need to be <180 (2h after you start eating) * Your Systolic BP should be 140 or lower  * Your Diastolic BP should be 80 or lower  * Your HDL (Good Cholesterol) should be 40 or higher  * Your LDL (Bad Cholesterol) should be 100 or lower. * Your Triglycerides should be 150 or lower  * Your Urine microalbumin (kidney function) should be <30 * Your Body Mass Index should be 25 or lower   We will be glad to help you achieve these goals. Our telephone number is: [phone].

## 2014-03-05 NOTE — Progress Notes (Signed)
Patient ID: Martin Downs, male   DOB: [birth year], 48 y.o.   MRN: [id number]  HPI: Martin Downs is a 48 y.o.-year-old male, referred by his PCP, Dr. Dayton Martes, for management of DM2, insulin-dependent, uncontrolled, with complications (mild CKD).  Patient has been diagnosed with diabetes in 2014; he has not been on insulin before.   Last hemoglobin A1c was: Lab Results  Component Value Date   HGBA1C 12.5* 02/11/2014   HGBA1C 11.9* 05/13/2013   HGBA1C 12.9* 02/26/2013   Pt is on a regimen of: - Glipizide 10 mg bid - Actoplus 15-850 mg bid  Pt does not check his sugars a day as he became frustrated with his high sugars. No lows.   Pt's meals are: - Breakfast: oatmeal or yoghurt or fruit bar/cup - Lunch: PB sandwich - Dinner: chicken leftovers - Snacks: 1 pack of crackers, diet Mountain dew  - Mild CKD, last BUN/creatinine:  Lab Results  Component Value Date   BUN 15 05/13/2013   CREATININE 1.0 05/13/2013  Not on an ACEI. - last set of lipids: Lab Results  Component Value Date   CHOL 151 02/11/2014   HDL 42.20 02/11/2014   LDLCALC 73 02/11/2014   LDLDIRECT 98.6 04/03/2013   TRIG 181.0* 02/11/2014   CHOLHDL 4 02/11/2014  On Crestor and Fenofibrate. - last eye exam was 2 years ago. No DR.  - no numbness and tingling in his feet.  Pt has FH of DM in father and brother.  ROS: Constitutional: no weight gain/loss, + fatigue, no subjective hyperthermia/hypothermia Eyes: no blurry vision, no xerophthalmia ENT: no sore throat, no nodules palpated in throat, no dysphagia/odynophagia, no hoarseness Cardiovascular: no CP/SOB/palpitations/leg swelling Respiratory: no cough/SOB Gastrointestinal: no N/V/D/C Musculoskeletal: no muscle/joint aches Skin: no rashes Neurological: no tremors/numbness/tingling/dizziness Psychiatric: no depression/anxiety  Past Medical History  Diagnosis Date  . Hyperlipidemia   . Sleep apnea   . Borderline diabetes   . Anxiety   . Chronic pain    . Depression   . Narcotic dependence    Past Surgical History  Procedure Laterality Date  . Removal of bone spur     History   Social History  . Marital Status: Married    Spouse Name: N/A    Number of Children: 2   Occupational History  . Curator, truck driver Unemployed   Social History Main Topics  . Smoking status: Never Smoker   . Smokeless tobacco: Current User    Types: Snuff  . Alcohol Use: No  . Drug Use: No   Current Outpatient Prescriptions on File Prior to Visit  Medication Sig Dispense Refill  . aspirin 81 MG tablet Take 81 mg by mouth daily.        . colchicine 0.6 MG tablet Take 0.6 mg by mouth daily as needed. For gout flare ups Takes 1 tab daily until symptoms subsides      . CRESTOR 10 MG tablet TAKE 1 TABLET BY MOUTH EVERY DAY  30 tablet  5  . fenofibrate 160 MG tablet TAKE 1 TABLET (160 MG TOTAL) BY MOUTH DAILY.  30 tablet  5  . fluticasone (FLONASE) 50 MCG/ACT nasal spray USE 2 SPRAYS IN EACH NOSTRIL DAILY  16 g  1  . glipiZIDE (GLUCOTROL) 10 MG tablet TAKE 1 TABLET (10 MG TOTAL) BY MOUTH 2 (TWO) TIMES DAILY BEFORE A MEAL.  60 tablet  0  . methadone (DOLOPHINE) 10 MG/ML solution Take 105 mg by mouth daily.       Marland Kitchen  Multiple Vitamin (MULTIVITAMIN) tablet Take 2 tablets by mouth 2 (two) times daily.      . valACYclovir (VALTREX) 500 MG tablet Take 500 mg by mouth 2 (two) times daily as needed. For cold sore flare ups Takes twice daily until cold sore clears up      . vitamin B-12 (CYANOCOBALAMIN) 1000 MCG tablet Take 1,000 mcg by mouth daily.       No current facility-administered medications on file prior to visit.   Allergies  Allergen Reactions  . Gemfibrozil     REACTION: diarrhea   Family History  Problem Relation Age of Onset  . Heart disease Father   . Cancer Father     unsure what kind   PE: BP 122/80  Pulse 74  Temp(Src) 98.6 F (37 C) (Oral)  Resp 12  Ht 5\' 9"  (1.753 m)  Wt 210 lb (95.255 kg)  BMI 31.00 kg/m2  SpO2 95% Wt  Readings from Last 3 Encounters:  03/05/14 210 lb (95.255 kg)  02/11/14 207 lb (93.895 kg)  08/18/13 196 lb 8 oz (89.132 kg)   Constitutional: overweight, in NAD Eyes: PERRLA, EOMI, no exophthalmos ENT: moist mucous membranes, no thyromegaly, no cervical lymphadenopathy Cardiovascular: RRR, No MRG Respiratory: CTA B Gastrointestinal: abdomen soft, NT, ND, BS+ Musculoskeletal: no deformities, strength intact in all 4 Skin: moist, warm, no rashes Neurological: no tremor with outstretched hands, DTR normal in all 4  ASSESSMENT: 1. DM2, insulin-dependent, uncontrolled, with complications - mild ckd  PLAN:  1. Patient with 1 year h/o uncontrolled diabetes, on oral antidiabetic regimen, with glucotoxicity. 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 Please stop ActoPlus. Start Metformin 1000 mg 2x a day with meals. Start Levemir 20 units at bedtime. If sugars in am not <150 after 4 days, increase to 25 units, and in 4 more days, if sugars in am not <150, increase to 30 units.  Please return in 1 month with your sugar log.   - Strongly advised him to start checking sugars at different times of the day - check 2 times a day, rotating checks - given sugar log and advised how to fill it and to bring it at next appt  - given foot care handout and explained the principles  - given instructions for hypoglycemia management "15-15 rule"  - advised for yearly eye exams - Return to clinic in 1 mo with sugar log

## 2014-03-22 ENCOUNTER — Ambulatory Visit: Payer: 59 | Admitting: Cardiovascular Disease

## 2014-03-29 ENCOUNTER — Other Ambulatory Visit: Payer: Self-pay | Admitting: Family Medicine

## 2014-04-09 ENCOUNTER — Other Ambulatory Visit: Payer: Self-pay | Admitting: Family Medicine

## 2014-04-19 ENCOUNTER — Encounter: Payer: Self-pay | Admitting: Internal Medicine

## 2014-04-19 ENCOUNTER — Ambulatory Visit (INDEPENDENT_AMBULATORY_CARE_PROVIDER_SITE_OTHER): Payer: 59 | Admitting: Internal Medicine

## 2014-04-19 VITALS — BP 122/76 | HR 79 | Temp 97.9°F | Resp 12 | Wt 208.0 lb

## 2014-04-19 DIAGNOSIS — E119 Type 2 diabetes mellitus without complications: Secondary | ICD-10-CM

## 2014-04-19 MED ORDER — INSULIN DETEMIR 100 UNIT/ML FLEXPEN: 45.0000 [IU] | PEN_INJECTOR | Freq: Every day | SUBCUTANEOUS | Status: DC

## 2014-04-19 MED ORDER — INSULIN ASPART 100 UNIT/ML FLEXPEN: 8.0000 [IU] | PEN_INJECTOR | Freq: Three times a day (TID) | SUBCUTANEOUS | Status: DC

## 2014-04-19 NOTE — Patient Instructions (Signed)
Please continue Glipizide 10 mg 2x a day, before meals. Continue Metformin 1000 mg 2x a day with meals. Increase Levemir to 45 units at bedtime. Start NovoLog 15 min before a meal: - 8 units before a smaller meal - 12 units before a larger meal (you may increase these doses if you see that the sugars 2h after meals stay >180)  Please return in 1 month with your sugar log.

## 2014-04-19 NOTE — Progress Notes (Signed)
Patient ID: Martin Downs, male   DOB: [birth year], 48 y.o.   MRN: [id number]  HPI: Martin Downs is a 48 y.o.-year-old male, returning for f/u for DM2 dx 2014, insulin-dependent since 02/2014, uncontrolled, with complications (mild CKD).  Last hemoglobin A1c was: Lab Results  Component Value Date   HGBA1C 12.5* 02/11/2014   HGBA1C 11.9* 05/13/2013   HGBA1C 12.9* 02/26/2013   Pt is on a regimen of: - Glipizide 10 mg bid - Metformin 1000 mg bid - Levemir 20 >> 30 units qhs We stopped Actoplus 15-850 mg in 02/2014.  Pt started to check sugars after last visit - sugars very high despite adding Metformin and Levemir: - am: 203-377 - 2h after b'fast: n/c - lunch: n/c - 2h after lunch: 399 - dinner: 242-345 - 2h after dinner: n/c - bedtime: 217-425 No lows.   Pt's meals are - grazes: - Breakfast: oatmeal or yoghurt or fruit bar/cup (largest meal) - Lunch: PB sandwich - Dinner: chicken leftovers - Snacks: 1 pack of crackers, diet Mountain dew  - Mild CKD, last BUN/creatinine:  Lab Results  Component Value Date   BUN 15 05/13/2013   CREATININE 1.0 05/13/2013  Not on an ACEI. - last set of lipids: Lab Results  Component Value Date   CHOL 151 02/11/2014   HDL 42.20 02/11/2014   LDLCALC 73 02/11/2014   LDLDIRECT 98.6 04/03/2013   TRIG 181.0* 02/11/2014   CHOLHDL 4 02/11/2014  On Crestor and Fenofibrate. - last eye exam was 2 years ago. No DR.  - no numbness and tingling in his feet.  I reviewed pt's medications, allergies, PMH, social hx, family hx and no changes required, except as mentioned above.  ROS: Constitutional: no weight gain/loss, + fatigue, no subjective hyperthermia/hypothermia Eyes: no blurry vision, no xerophthalmia ENT: no sore throat, no nodules palpated in throat, no dysphagia/odynophagia, no hoarseness Cardiovascular: no CP/SOB/palpitations/leg swelling Respiratory: no cough/SOB Gastrointestinal: no N/V/D/C Musculoskeletal: no muscle/joint  aches Skin: no rashes Neurological: no tremors/numbness/tingling/dizziness  PE: BP 122/76  Pulse 79  Temp(Src) 97.9 F (36.6 C) (Oral)  Resp 12  Wt 208 lb (94.348 kg)  SpO2 98% Wt Readings from Last 3 Encounters:  04/19/14 208 lb (94.348 kg)  03/05/14 210 lb (95.255 kg)  02/11/14 207 lb (93.895 kg)  =Constitutional: overweight, in NAD Eyes: PERRLA, EOMI, no exophthalmos ENT: moist mucous membranes, no thyromegaly, no cervical lymphadenopathy Cardiovascular: RRR, No MRG Respiratory: CTA B Gastrointestinal: abdomen soft, NT, ND, BS+ Musculoskeletal: no deformities, strength intact in all 4 Skin: moist, warm, no rashes Neurological: no tremor with outstretched hands, DTR normal in all 4  ASSESSMENT: 1. DM2, insulin-dependent, uncontrolled, with complications - mild ckd  PLAN:  1. Patient with 1 year h/o uncontrolled diabetes, on oral antidiabetic regimen, with glucotoxicity. 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 Continue Metformin 1000 mg 2x a day with meals. Increase Levemir to 45 units at bedtime. Start NovoLog 15 min before a meal: - 8 units before a smaller meal - 12 units before a larger meal (you may increase these doses if you see that the sugars 2h after meals stay >180)  Please return in 1 month with your sugar log.   - Strongly advised him to start checking sugars at different times of  the day - check 3 times a day, rotating checks - advised for yearly eye exams - he is due! - refuses flu vaccine >> gets sick when he takes it - Return to clinic in 1 mo with sugar log

## 2014-04-26 ENCOUNTER — Ambulatory Visit (INDEPENDENT_AMBULATORY_CARE_PROVIDER_SITE_OTHER): Payer: 59 | Admitting: Cardiovascular Disease

## 2014-04-26 ENCOUNTER — Encounter: Payer: Self-pay | Admitting: Cardiovascular Disease

## 2014-04-26 VITALS — BP 130/78 | HR 72 | Ht 69.0 in | Wt 213.0 lb

## 2014-04-26 DIAGNOSIS — R9431 Abnormal electrocardiogram [ECG] [EKG]: Secondary | ICD-10-CM

## 2014-04-26 DIAGNOSIS — Z8249 Family history of ischemic heart disease and other diseases of the circulatory system: Secondary | ICD-10-CM

## 2014-04-26 NOTE — Progress Notes (Signed)
Martin Downs Date of Birth  1966/04/13       [facility]    [place] [address]  [address] [place], [place]  [address]   [place], [place]  [address] [phone]     [phone]   Fax  [phone]     Fax [phone]  Problem List: 1. Abnormal ECG- at the methadone clinic 2. Hyperlipidemia 3. Diabetes mellitus-newly diagnosed 4. Former narcotic abuse - now on Methadone.  History of Present Illness:  Martin Downs is a 48 year old very nice  gentleman who was referred here for an abnormal EKG. He does fairly heavy work. He unloads trucks at night. He's never had any episodes of chest pain or shortness of breath. He does that he becomes fatigued a little bit quicker than he used to.  He tries to avoid eating any fatty food or fast foods.  He has a strong family history of coronary artery disease. His father had a heart attack his brother recently had a heart attack. No smoker Does not drink any alcohol. 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    He was noted to have some abnormality on his ECG at the methadone clinic and was sent here for further evaluation.    Current Outpatient Prescriptions on File Prior to Visit  Medication Sig Dispense Refill  . aspirin 81 MG tablet Take 81 mg by mouth daily.        . colchicine 0.6 MG tablet Take 0.6 mg by mouth daily as needed. For gout flare ups Takes 1 tab daily until symptoms subsides      . CRESTOR 10 MG tablet TAKE 1 TABLET BY MOUTH EVERY DAY  30 tablet  5  . fenofibrate 160 MG tablet TAKE 1 TABLET (160 MG TOTAL) BY MOUTH DAILY.  30 tablet  5  . fluticasone (FLONASE) 50 MCG/ACT nasal spray USE 2 SPRAYS IN EACH NOSTRIL DAILY  16 g  1  . glipiZIDE (GLUCOTROL) 10 MG tablet TAKE 1 TABLET (10 MG TOTAL) BY MOUTH 2 (TWO) TIMES DAILY BEFORE A MEAL.  60 tablet  0  . insulin aspart (NOVOLOG FLEXPEN) 100 UNIT/ML FlexPen Inject  8-12 Units into the skin 3 (three) times daily with meals.  15 mL  3  . Insulin Detemir (LEVEMIR) 100 UNIT/ML Pen Inject 45 Units into the skin at bedtime.  15 mL  3  . Insulin Pen Needle 32G X 4 MM MISC Use once a day  100 each  3  . metFORMIN (GLUCOPHAGE) 1000 MG tablet Take 1 tablet (1,000 mg total) by mouth 2 (two) times daily with a meal.  180 tablet  3  . methadone (DOLOPHINE) 10 MG/ML solution Take 105 mg by mouth daily.       . Multiple Vitamin (MULTIVITAMIN) tablet Take 2 tablets by mouth 2 (two) times daily.      . ONE TOUCH ULTRA TEST test strip USE 2 TIMES A DAY  50 each  11  . ONETOUCH DELICA LANCETS 33G MISC USE 2 TIMES A DAY  100 each  11  . valACYclovir (VALTREX) 500 MG tablet Take 500 mg by mouth 2 (two) times daily as needed. For cold sore flare ups Takes twice daily until cold sore clears up      . vitamin B-12 (CYANOCOBALAMIN) 1000 MCG tablet Take 1,000 mcg by mouth daily.  No current facility-administered medications on file prior to visit.    Allergies  Allergen Reactions  . Gemfibrozil     REACTION: diarrhea    Past Medical History  Diagnosis Date  . Hyperlipidemia   . Sleep apnea   . Borderline diabetes   . Anxiety   . Chronic pain   . Depression   . Narcotic dependence     Past Surgical History  Procedure Laterality Date  . Removal of bone spur      History  Smoking status  . Never Smoker   Smokeless tobacco  . Current User  . Types: Snuff    History  Alcohol Use: Not on file    Family History  Problem Relation Age of Onset  . Heart disease Father   . Cancer Father     unsure what kind    Reviw of Systems:  Reviewed in the HPI.  All other systems are negative.  Physical Exam: Blood pressure 130/78, pulse 72, height  (1.753 m), weight 213 lb (96.616 kg). Wt Readings from Last 3 Encounters:  04/26/14 213 lb (96.616 kg)  04/19/14 208 lb (94.348 kg)  03/05/14 210 lb (95.255 kg)     General: Well developed, well  nourished, in no acute distress.  Head: Normocephalic, atraumatic, sclera non-icteric, mucus membranes are moist,   Neck: Supple. Carotids are 2 + without bruits. No JVD   Lungs: Clear   Heart: RR, normal ,S1S2  Abdomen: Soft, non-tender, non-distended with normal bowel sounds.  Msk:  Strength and tone are normal   Extremities: No clubbing or cyanosis. No edema.  Distal pedal pulses are 2+ and equal    Neuro: CN II - XII intact.  Alert and oriented X 3.   Psych:  Normal   ECG: Sept. 28, 2015:  NSR at 72 , RAD, essentially normal ECG  Assessment / Plan:

## 2014-04-26 NOTE — Assessment & Plan Note (Signed)
He has a family history of coronary artery disease. He's never had any symptoms himself. His lipid levels are fairly well controlled. His triglyceride level is a little elevated.  Given his absence of symptoms, I do not think that a stress test is indicated. The pretest probability of coronary disease is fairly low.  He works very long hours and works a very strenuous job has never had episodes of chest pain or shortness. He does have some fatigue. This is probably because he does not sleep well.  Long discussion about symptoms that would be concerning. I have advised him to come back and see me if he has any episodes of chest pain or chest tightness. At this point I do not think that there is any role for stress testing.  I will see him  as needed.

## 2014-04-26 NOTE — Patient Instructions (Signed)
Your physician recommends that you schedule a follow-up appointment in: as needed with Dr. Nahser  

## 2014-04-27 ENCOUNTER — Other Ambulatory Visit: Payer: Self-pay | Admitting: *Deleted

## 2014-04-27 ENCOUNTER — Other Ambulatory Visit: Payer: Self-pay | Admitting: Internal Medicine

## 2014-04-27 ENCOUNTER — Telehealth: Payer: Self-pay | Admitting: Internal Medicine

## 2014-04-27 MED ORDER — GLUCOSE BLOOD VI STRP: ORAL_STRIP | Status: DC

## 2014-04-27 NOTE — Telephone Encounter (Signed)
Patient need a prescription of one touch ultra test strips enough to take 8 x a day.

## 2014-04-28 NOTE — Telephone Encounter (Signed)
Last filled 12/14 with 5 refills--last lipid panel--chol was normal--please advise if pt is to continue medication

## 2014-04-29 ENCOUNTER — Telehealth: Payer: Self-pay | Admitting: Family Medicine

## 2014-04-29 MED ORDER — ONETOUCH ULTRA MINI W/DEVICE KIT: PACK | Status: AC

## 2014-04-29 NOTE — Telephone Encounter (Signed)
Pt was given a One Touch Ultra Mini Diabetic Kit a couple years ago and the springs are not working anymore to stick his finger. He wanted to know if it was possible to get another one of the same kit since he still has a lot of supplies left.

## 2014-04-29 NOTE — Telephone Encounter (Signed)
New kit sent to pharmacy as requested

## 2014-05-04 ENCOUNTER — Other Ambulatory Visit: Payer: Self-pay | Admitting: *Deleted

## 2014-05-04 MED ORDER — GLIPIZIDE 10 MG PO TABS: ORAL_TABLET | ORAL | Status: DC

## 2014-05-31 ENCOUNTER — Telehealth: Payer: Self-pay | Admitting: Internal Medicine

## 2014-05-31 ENCOUNTER — Encounter: Payer: Self-pay | Admitting: Internal Medicine

## 2014-05-31 ENCOUNTER — Other Ambulatory Visit: Payer: Self-pay | Admitting: *Deleted

## 2014-05-31 ENCOUNTER — Ambulatory Visit (INDEPENDENT_AMBULATORY_CARE_PROVIDER_SITE_OTHER): Payer: 59 | Admitting: Internal Medicine

## 2014-05-31 VITALS — BP 128/72 | HR 99 | Temp 98.3°F | Resp 12 | Wt 213.0 lb

## 2014-05-31 DIAGNOSIS — E1121 Type 2 diabetes mellitus with diabetic nephropathy: Secondary | ICD-10-CM | POA: Insufficient documentation

## 2014-05-31 DIAGNOSIS — E1165 Type 2 diabetes mellitus with hyperglycemia: Secondary | ICD-10-CM

## 2014-05-31 DIAGNOSIS — E1129 Type 2 diabetes mellitus with other diabetic kidney complication: Secondary | ICD-10-CM

## 2014-05-31 DIAGNOSIS — IMO0002 Reserved for concepts with insufficient information to code with codable children: Secondary | ICD-10-CM

## 2014-05-31 LAB — COMPREHENSIVE METABOLIC PANEL
ALK PHOS: 62 U/L (ref 39–117)
ALT: 115 U/L — ABNORMAL HIGH (ref 0–53)
AST: 49 U/L — ABNORMAL HIGH (ref 0–37)
Albumin: 3.8 g/dL (ref 3.5–5.2)
BILIRUBIN TOTAL: 0.8 mg/dL (ref 0.2–1.2)
BUN: 14 mg/dL (ref 6–23)
CO2: 27 meq/L (ref 19–32)
CREATININE: 0.9 mg/dL (ref 0.4–1.5)
Calcium: 9.6 mg/dL (ref 8.4–10.5)
Chloride: 100 mEq/L (ref 96–112)
GFR: 94.9 mL/min (ref >60.00)
GLUCOSE: 283 mg/dL — AB (ref 70–99)
Potassium: 4.1 mEq/L (ref 3.5–5.1)
Sodium: 135 mEq/L (ref 135–145)
Total Protein: 6.9 g/dL (ref 6.0–8.3)

## 2014-05-31 LAB — HEMOGLOBIN A1C: HEMOGLOBIN A1C: 11.2 % — AB (ref 4.6–6.5)

## 2014-05-31 MED ORDER — INSULIN ASPART 100 UNIT/ML FLEXPEN: 28.0000 [IU] | PEN_INJECTOR | Freq: Three times a day (TID) | SUBCUTANEOUS | Status: DC

## 2014-05-31 MED ORDER — ONETOUCH DELICA LANCETS 33G MISC: Status: DC

## 2014-05-31 MED ORDER — INSULIN GLARGINE 300 UNIT/ML ~~LOC~~ SOPN: 60.0000 [IU] | PEN_INJECTOR | Freq: Every day | SUBCUTANEOUS | Status: DC

## 2014-05-31 NOTE — Progress Notes (Signed)
Patient ID: Martin Downs, male   DOB: 11/02/1965, 48 y.o.   MRN: [id number]  HPI: Martin ChyleJerry Thomas Mahr is a [age] y.o.-year-old male, returning for f/u for DM2 dx 2014, insulin-dependent since 02/2014, uncontrolled, with complications (mild CKD).  Last hemoglobin A1c was: Lab Results  Component Value Date   HGBA1C 12.5* 02/11/2014   HGBA1C 11.9* 05/13/2013   HGBA1C 12.9* 02/26/2013   Pt is on a regimen of: - Glipizide 10 mg bid - Metformin 1000 mg bid - Levemir 20 >> 30 >> 45 units qhs - NovoLog 15 min before a meal - started 03/2014 - 8 units before a smaller meal >> 20 now - 12 units before a larger meal >> 20 now We stopped Actoplus 15-850 mg in 02/2014.  Pt started to check sugars after last visit - sugars very high despite adding mealtime insulin: - am: 203-377 >> 120, 236-373 - 2h after b'fast: n/c >> 284-365, 441, 481 - lunch: n/c >> 198-400 - 2h after lunch: 399 >> 241-338, 386 - dinner: 242-345 >> 165-294 - 2h after dinner: n/c >> 175-307 - bedtime: 217-425 >> 163, 220-377 No lows. Lowest 94 x1.   Pt's meals are - grazes: - Breakfast: oatmeal or yoghurt or fruit bar/cup (largest meal) - Lunch: PB sandwich - Dinner: chicken leftovers - Snacks: 1 pack of crackers, diet Mountain dew  - Mild CKD, last BUN/creatinine:  Lab Results  Component Value Date   BUN 15 05/13/2013   CREATININE 1.0 05/13/2013  Not on an ACEI. - last set of lipids: Lab Results  Component Value Date   CHOL 151 02/11/2014   HDL 42.20 02/11/2014   LDLCALC 73 02/11/2014   LDLDIRECT 98.6 04/03/2013   TRIG 181.0* 02/11/2014   CHOLHDL 4 02/11/2014  On Crestor and Fenofibrate. - last eye exam was 2 years ago. No DR.  - no numbness and tingling in his feet.  I reviewed pt's medications, allergies, PMH, social hx, family hx and no changes required, except as mentioned above.  ROS: Constitutional: no weight gain/loss, + fatigue, no subjective hyperthermia/hypothermia, + poor sleep Eyes: no  blurry vision, no xerophthalmia ENT: no sore throat, no nodules palpated in throat, no dysphagia/odynophagia, no hoarseness Cardiovascular: no CP/SOB/palpitations/leg swelling Respiratory: no cough/SOB Gastrointestinal: no N/V/D/C Musculoskeletal: no muscle/joint aches Skin: no rashes Neurological: no tremors/numbness/tingling/dizziness  PE: BP 128/72 mmHg  Pulse 99  Temp(Src) 98.3 F (36.8 C) (Oral)  Resp 12  Wt 213 lb (96.616 kg)  SpO2 97% Body mass index is 31.44 kg/(m^2).  Wt Readings from Last 3 Encounters:  05/31/14 213 lb (96.616 kg)  04/26/14 213 lb (96.616 kg)  04/19/14 208 lb (94.348 kg)  =Constitutional: overweight, in NAD Eyes: PERRLA, EOMI, no exophthalmos ENT: moist mucous membranes, no thyromegaly, no cervical lymphadenopathy Cardiovascular: RRR, No MRG Respiratory: CTA B Gastrointestinal: abdomen soft, NT, ND, BS+ Musculoskeletal: no deformities, strength intact in all 4 Skin: moist, warm, no rashes Neurological: no tremor with outstretched hands, DTR normal in all 4  ASSESSMENT: 1. 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 Patient with ~1 year h/o uncontrolled diabetes, on oral antidiabetic regimen, with increased glucotoxicity. Sugars still uniformly high... - We discussed about options for treatment, and I suggested to increase insulins:  Patient Instructions  Please schedule an appointment with Dr. Antony ContrasGraham Lyles. Please stop at the lab. Please stop Glipizide. Continue  Metformin 1000 mg bid Stop Levemir and start Toujeo 60 units at bedtime. Increase NovoLog 15 min before a meal: - 28 units before a smaller meal - 35 units before a larger meal   - Strongly advised him to start checking sugars at different times of the day - check 3 times a day, rotating checks - advised for yearly eye exams - he is due! -  will check CMP and HbA1c - refuses flu vaccine >> gets sick when he takes it - Return to clinic in 1.5 mo with sugar log   Office Visit on 05/31/2014  Component Date Value Ref Range Status  . Sodium 05/31/2014 135  135 - 145 mEq/L Final  . Potassium 05/31/2014 4.1  3.5 - 5.1 mEq/L Final  . Chloride 05/31/2014 100  96 - 112 mEq/L Final  . CO2 05/31/2014 27  19 - 32 mEq/L Final  . Glucose, Bld 05/31/2014 283* 70 - 99 mg/dL Final  . BUN 16/10/960411/08/2013 14  6 - 23 mg/dL Final  . Creatinine, Ser 05/31/2014 0.9  0.4 - 1.5 mg/dL Final  . Total Bilirubin 05/31/2014 0.8  0.2 - 1.2 mg/dL Final  . Alkaline Phosphatase 05/31/2014 62  39 - 117 U/L Final  . AST 05/31/2014 49* 0 - 37 U/L Final  . ALT 05/31/2014 115* 0 - 53 U/L Final  . Total Protein 05/31/2014 6.9  6.0 - 8.3 g/dL Final  . Albumin 54/09/811911/08/2013 3.8  3.5 - 5.2 g/dL Final  . Calcium 14/78/295611/08/2013 9.6  8.4 - 10.5 mg/dL Final  . GFR 21/30/865711/08/2013 94.90  >60.00 mL/min Final  . Hgb A1c MFr Bld 05/31/2014 11.2* 4.6 - 6.5 % Final   Glycemic Control Guidelines for People with Diabetes:Non Diabetic:  <6%Goal of Therapy: <7%Additional Action Suggested:  >8%    HbA1c still very high!AST/ALT high. Unclear if from statins/metformin/other factors. Will likely need a new appt with PCP to look into this.

## 2014-05-31 NOTE — Patient Instructions (Addendum)
Please schedule an appointment with Dr. Antony ContrasGraham Lyles. Please stop at the lab.  Please stop Glipizide. Continue Metformin 1000 mg bid Stop Levemir and start Toujeo 60 units at bedtime. Increase NovoLog 15 min before a meal: - 28 units before a smaller meal - 35 units before a larger meal

## 2014-05-31 NOTE — Telephone Encounter (Signed)
Please read note below and advise.  

## 2014-05-31 NOTE — Telephone Encounter (Signed)
Let's call in Levemir pens - inject sq 30 units 2x a day

## 2014-05-31 NOTE — Telephone Encounter (Signed)
Patient came in the office today for a visit with Dr. Elvera LennoxGherghe  Patients insurance will not cover the Toujeo   Please advise patient    Thank you

## 2014-06-01 ENCOUNTER — Other Ambulatory Visit: Payer: Self-pay | Admitting: *Deleted

## 2014-06-01 ENCOUNTER — Telehealth: Payer: Self-pay | Admitting: Internal Medicine

## 2014-06-01 MED ORDER — INSULIN DETEMIR 100 UNIT/ML FLEXPEN: 30.0000 [IU] | PEN_INJECTOR | Freq: Two times a day (BID) | SUBCUTANEOUS | Status: DC

## 2014-06-01 MED ORDER — INSULIN PEN NEEDLE 32G X 4 MM MISC: Status: DC

## 2014-06-01 NOTE — Telephone Encounter (Signed)
Change per Dr Elvera LennoxGherghe.

## 2014-06-01 NOTE — Telephone Encounter (Signed)
Done

## 2014-06-01 NOTE — Telephone Encounter (Signed)
Pen needles sent to pt's pharmacy. Lancets sent yesterday.

## 2014-06-01 NOTE — Telephone Encounter (Signed)
Patient need refill of Lancets about 2 box, and two boxes of pin needles, He stated that he dosen't have enough to last for the month.

## 2014-06-02 ENCOUNTER — Encounter: Payer: Self-pay | Admitting: *Deleted

## 2014-06-28 ENCOUNTER — Ambulatory Visit (INDEPENDENT_AMBULATORY_CARE_PROVIDER_SITE_OTHER): Payer: 59 | Admitting: Family Medicine

## 2014-06-28 ENCOUNTER — Encounter: Payer: Self-pay | Admitting: Family Medicine

## 2014-06-28 VITALS — BP 130/72 | HR 83 | Temp 98.3°F | Wt 212.2 lb

## 2014-06-28 DIAGNOSIS — E1121 Type 2 diabetes mellitus with diabetic nephropathy: Secondary | ICD-10-CM

## 2014-06-28 DIAGNOSIS — R7989 Other specified abnormal findings of blood chemistry: Secondary | ICD-10-CM

## 2014-06-28 DIAGNOSIS — R945 Abnormal results of liver function studies: Principal | ICD-10-CM

## 2014-06-28 DIAGNOSIS — IMO0002 Reserved for concepts with insufficient information to code with codable children: Secondary | ICD-10-CM

## 2014-06-28 DIAGNOSIS — E1129 Type 2 diabetes mellitus with other diabetic kidney complication: Secondary | ICD-10-CM

## 2014-06-28 DIAGNOSIS — E1165 Type 2 diabetes mellitus with hyperglycemia: Secondary | ICD-10-CM

## 2014-06-28 LAB — HEPATIC FUNCTION PANEL
ALT: 34 U/L (ref 0–53)
AST: 29 U/L (ref 0–37)
Albumin: 4.3 g/dL (ref 3.5–5.2)
Alkaline Phosphatase: 62 U/L (ref 39–117)
BILIRUBIN DIRECT: 0 mg/dL (ref 0.0–0.3)
BILIRUBIN TOTAL: 0.6 mg/dL (ref 0.2–1.2)
Total Protein: 6.7 g/dL (ref 6.0–8.3)

## 2014-06-28 LAB — GAMMA GT: GGT: 55 U/L — AB (ref 7–51)

## 2014-06-28 MED ORDER — FLUTICASONE PROPIONATE 50 MCG/ACT NA SUSP: 2.0000 | Freq: Every day | NASAL | Status: DC

## 2014-06-28 NOTE — Assessment & Plan Note (Signed)
New- >25 minutes spent in face to face time with patient, >50% spent in counselling or coordination of care Advised that we start out by repeating his liver function, add GGT and hepatitis panel to labs. If remains elevated or increased, will proceed with abdominal US. I suspect this is a combination of fatty liver and medications. May need to decrease dose of crestor, will dc it if LFTs increased to 3 times the upper limits of normal. The patient indicates understanding of these issues and agrees with the plan.

## 2014-06-28 NOTE — Progress Notes (Signed)
Subjective:   Patient ID: Martin Downs, male    DOB: 03-31-1966, 48 y.o.   MRN: [id number]  Doryan Bahl is a pleasant 48 y.o. year old male with h/o depression, chronic pain, hypothyroidism, poorly controlled DM, HLD  who presents to clinic today with Results  on 06/28/2014  HPI:  DM is poorly controlled- now being followed by Dr. Cruzita Lederer. Last saw her on 11/2- note reviewed. LFTs were elevated at that visit and advised he follow up with me here today. Has had no abdominal pain, has not noticed any jaundice.  Lab Results  Component Value Date   ALT 115* 05/31/2014   AST 49* 05/31/2014   ALKPHOS 62 05/31/2014   BILITOT 0.8 05/31/2014   He does take statin- Crestor 10 mg daily- for HLD.  Does not take Tylenol.  Does take occasional Ibuprofen. Lab Results  Component Value Date   CHOL 151 02/11/2014   HDL 42.20 02/11/2014   LDLCALC 73 02/11/2014   LDLDIRECT 98.6 04/03/2013   TRIG 181.0* 02/11/2014   CHOLHDL 4 02/11/2014   Current Outpatient Prescriptions on File Prior to Visit  Medication Sig Dispense Refill  . aspirin 81 MG tablet Take 81 mg by mouth daily.      . Blood Glucose Monitoring Suppl (ONE TOUCH ULTRA MINI) W/DEVICE KIT Use as directed to test blood sugar twice daily 250.00 1 each 0  . colchicine 0.6 MG tablet Take 0.6 mg by mouth daily as needed. For gout flare ups Takes 1 tab daily until symptoms subsides    . CRESTOR 10 MG tablet TAKE 1 TABLET BY MOUTH EVERY DAY 30 tablet 5  . fenofibrate 160 MG tablet TAKE 1 TABLET (160 MG TOTAL) BY MOUTH DAILY. 30 tablet 5  . fluticasone (FLONASE) 50 MCG/ACT nasal spray USE 2 SPRAYS IN EACH NOSTRIL DAILY 16 g 1  . glucose blood (ONE TOUCH ULTRA TEST) test strip Use to test blood sugar 8 times daily as instructed. 300 each 11  . insulin aspart (NOVOLOG FLEXPEN) 100 UNIT/ML FlexPen Inject 28-35 Units into the skin 3 (three) times daily with meals. 30 mL 3  . Insulin Detemir (LEVEMIR FLEXTOUCH) 100 UNIT/ML Pen Inject  30 Units into the skin 2 (two) times daily. 10 pen 2  . Insulin Pen Needle 32G X 4 MM MISC Use to inject insulin 5 times daily as instructed. 150 each 5  . metFORMIN (GLUCOPHAGE) 1000 MG tablet Take 1 tablet (1,000 mg total) by mouth 2 (two) times daily with a meal. 180 tablet 3  . methadone (DOLOPHINE) 10 MG/ML solution Take 105 mg by mouth daily.     . Multiple Vitamin (MULTIVITAMIN) tablet Take 2 tablets by mouth 2 (two) times daily.    Glory Rosebush DELICA LANCETS 78L MISC Use to test blood sugar 4 times daily as instructed. 200 each 5  . valACYclovir (VALTREX) 500 MG tablet Take 500 mg by mouth 2 (two) times daily as needed. For cold sore flare ups Takes twice daily until cold sore clears up    . vitamin B-12 (CYANOCOBALAMIN) 1000 MCG tablet Take 1,000 mcg by mouth daily.     No current facility-administered medications on file prior to visit.    Allergies  Allergen Reactions  . Gemfibrozil     REACTION: diarrhea    Past Medical History  Diagnosis Date  . Hyperlipidemia   . Sleep apnea   . Borderline diabetes   . Anxiety   . Chronic pain   . Depression   .  Narcotic dependence     Past Surgical History  Procedure Laterality Date  . Removal of bone spur      Family History  Problem Relation Age of Onset  . Heart disease Father   . Cancer Father     unsure what kind    History   Social History  . Marital Status: Married    Spouse Name: N/A    Number of Children: Y  . Years of Education: N/A   Occupational History  . Dealer Unemployed   Social History Main Topics  . Smoking status: Never Smoker   . Smokeless tobacco: Current User    Types: Snuff  . Alcohol Use: Not on file  . Drug Use: Not on file  . Sexual Activity: Not on file   Other Topics Concern  . Not on file   Social History Narrative   The PMH, PSH, Social History, Family History, Medications, and allergies have been reviewed in [facility], and have been updated if relevant.   Review of Systems    Constitutional: Negative.   Gastrointestinal: Negative.   Musculoskeletal: Negative.   All other systems reviewed and are negative.      Objective:    BP 130/72 mmHg  Pulse 83  Temp(Src) 98.3 F (36.8 C) (Oral)  Wt 212 lb 4 oz (96.276 kg)  SpO2 97%   Physical Exam  Constitutional: He appears well-developed and well-nourished. No distress.  Cardiovascular: Normal rate.   Pulmonary/Chest: Effort normal.  Abdominal: Soft. He exhibits no distension. There is no tenderness.  Skin: Skin is warm and dry.  Psychiatric: He has a normal mood and affect. His behavior is normal. Judgment and thought content normal.  Nursing note and vitals reviewed.         Assessment & Plan:   Elevated liver function tests - Plan: Hepatic Function Panel, Gamma GT, Hepatitis panel, acute  Uncontrolled type 2 diabetes mellitus with nephropathy No Follow-up on file.

## 2014-06-28 NOTE — Patient Instructions (Signed)
Great to see you. Happy Holidays.  I will call you with your lab results. 

## 2014-06-29 LAB — HEPATITIS PANEL, ACUTE
HCV AB: NEGATIVE
HEP A IGM: NONREACTIVE
HEP B S AG: NEGATIVE
Hep B C IgM: NONREACTIVE

## 2014-07-15 ENCOUNTER — Ambulatory Visit (INDEPENDENT_AMBULATORY_CARE_PROVIDER_SITE_OTHER): Payer: 59 | Admitting: Internal Medicine

## 2014-07-15 ENCOUNTER — Encounter: Payer: Self-pay | Admitting: Internal Medicine

## 2014-07-15 VITALS — BP 122/78 | HR 89 | Temp 98.0°F | Resp 12 | Wt 210.0 lb

## 2014-07-15 DIAGNOSIS — E1121 Type 2 diabetes mellitus with diabetic nephropathy: Secondary | ICD-10-CM

## 2014-07-15 DIAGNOSIS — E1129 Type 2 diabetes mellitus with other diabetic kidney complication: Secondary | ICD-10-CM

## 2014-07-15 DIAGNOSIS — E1165 Type 2 diabetes mellitus with hyperglycemia: Secondary | ICD-10-CM

## 2014-07-15 DIAGNOSIS — IMO0002 Reserved for concepts with insufficient information to code with codable children: Secondary | ICD-10-CM

## 2014-07-15 NOTE — Progress Notes (Signed)
Patient ID: Martin Downs, male   DOB: 06/02/1966, 48 y.o.   MRN: [id number]  HPI: Martin ChyleJerry Thomas Downs is a 48 y.o.-year-old male, returning for f/u for DM2 dx 2014, insulin-dependent since 02/2014, uncontrolled, with complications (mild CKD). Last visit 1.5 mo ago.  His brother died recently of a heart attack.  Last hemoglobin A1c was: Lab Results  Component Value Date   HGBA1C 11.2* 05/31/2014   HGBA1C 12.5* 02/11/2014   HGBA1C 11.9* 05/13/2013   Pt is on a regimen of: - Metformin 1000 mg bid - Levemir 20 >> 30 >> 45 units qhs >> 35 units 2x a day - insurance did not cover Toujeo - NovoLog 15 min before a meal - started 03/2014 - 30 units before a smaller meal - 35 units before a larger meal >> not using We stopped Actoplus 15-850 mg in 02/2014. We stopped Glipizide when we increased Novolog.  Pt started to check sugars after last visit - sugars still high despite increasing mealtime insulin- He tells me he eats all the time: he does not consider 1/2 a sandwitch a meal >> he does not bolus for this!!: - am: 203-377 >> 120, 236-373 >> 170-356 (not fasting) - 2h after b'fast: n/c >> 284-365, 441, 481 >> 210-419 - lunch: n/c >> 198-400 >> 200-305 - 2h after lunch: 399 >> 241-338, 386 >> 280-421 - dinner: 242-345 >> 165-294 >> 219--241 - 2h after dinner: n/c >> 175-307 >> 237-349 - bedtime: 217-425 >> 163, 220-377 >> 208-340 - nighttime: 88 (sweating), 187-443  Pt's meals are - grazes  - Breakfast: oatmeal or yoghurt or fruit bar/cup (largest meal) - Lunch: PB sandwich - Dinner: chicken leftovers - Snacks: 1 pack of crackers, diet Mountain dew  - Mild CKD, last BUN/creatinine:  Lab Results  Component Value Date   BUN 14 05/31/2014   CREATININE 0.9 05/31/2014  Not on an ACEI. - last set of lipids: Lab Results  Component Value Date   CHOL 151 02/11/2014   HDL 42.20 02/11/2014   LDLCALC 73 02/11/2014   LDLDIRECT 98.6 04/03/2013   TRIG 181.0* 02/11/2014   CHOLHDL 4  02/11/2014  On Crestor and Fenofibrate. - last eye exam was 2 years ago. No DR.  - no numbness and tingling in his feet.  I reviewed pt's medications, allergies, PMH, social hx, family hx and no changes required, except as mentioned above.  ROS: Constitutional: no weight gain/loss, + fatigue, no subjective hyperthermia/hypothermia, + poor sleep Eyes: no blurry vision, no xerophthalmia ENT: no sore throat, no nodules palpated in throat, no dysphagia/odynophagia, no hoarseness Cardiovascular: no CP/SOB/palpitations/leg swelling Respiratory: no cough/SOB Gastrointestinal: no N/V/D/C Musculoskeletal: no muscle/joint aches Skin: no rashes Neurological: no tremors/numbness/tingling/dizziness  PE: There were no vitals taken for this visit. There is no weight on file to calculate BMI.  Wt Readings from Last 3 Encounters:  06/28/14 212 lb 4 oz (96.276 kg)  05/31/14 213 lb (96.616 kg)  04/26/14 213 lb (96.616 kg)  Constitutional: overweight, in NAD Eyes: PERRLA, EOMI, no exophthalmos ENT: moist mucous membranes, no thyromegaly, no cervical lymphadenopathy Cardiovascular: RRR, No MRG Respiratory: CTA B Gastrointestinal: abdomen soft, NT, ND, BS+ Musculoskeletal: no deformities, strength intact in all 4 Skin: moist, warm, no rashes Neurological: no tremor with outstretched hands, DTR normal in all 4  ASSESSMENT: 1. 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 Patient with ~1 year h/o uncontrolled diabetes, with increased glucotoxicity. Sugars still  High... He tells me he eats all the time: he does not consider 1/2 a sandwitch a meal >> he does not bolus for this >> advised him to start doing so. Willa lso increase Levemir and will likely need U500 at next visit (but now has a lot of insulin at home). - We discussed about options for treatment,  and I suggested to increase insulins:  Patient Instructions  Please continue: - Metformin 1000 mg 2x a day Increase: - Levemir 40 units 2x a day - NovoLog 15 min before a meal:  - 30 units before a smaller meal - 35 units before a larger meal  Add Novolog 15 units for a snack.  Please return in 1.5 month with your sugar log.   - Strongly advised him to start checking sugars at different times of the day - check 3 times a day, rotating checks - advised for yearly eye exams - he is due! - refuses flu vaccine >> gets sick when he takes it - Return to clinic in 1.5 mo with sugar log

## 2014-07-15 NOTE — Patient Instructions (Signed)
Please continue: - Metformin 1000 mg 2x a day Increase: - Levemir 40 units 2x a day - NovoLog 15 min before a meal:  - 30 units before a smaller meal - 35 units before a larger meal  Add Novolog 15 units for a snack.  Please return in 1.5 month with your sugar log.

## 2014-08-17 ENCOUNTER — Ambulatory Visit (INDEPENDENT_AMBULATORY_CARE_PROVIDER_SITE_OTHER): Payer: 59 | Admitting: Internal Medicine

## 2014-08-17 ENCOUNTER — Encounter: Payer: Self-pay | Admitting: Internal Medicine

## 2014-08-17 VITALS — BP 136/88 | HR 85 | Temp 98.8°F | Wt 218.5 lb

## 2014-08-17 DIAGNOSIS — J069 Acute upper respiratory infection, unspecified: Secondary | ICD-10-CM

## 2014-08-17 DIAGNOSIS — B9789 Other viral agents as the cause of diseases classified elsewhere: Principal | ICD-10-CM

## 2014-08-17 NOTE — Progress Notes (Signed)
HPI  Pt presents to the clinic today with c/o cough, chest congestion and body aches. He reports this started 4 days ago. The cough is non productive. He denies all other URI symptoms. He denies fever or chills. He has taken Nyquil OTC without relief. He has no history of allergies or breathing problems. He has not had sick contacts.  Review of Systems      Past Medical History  Diagnosis Date  . Hyperlipidemia   . Sleep apnea   . Borderline diabetes   . Anxiety   . Chronic pain   . Depression   . Narcotic dependence     Family History  Problem Relation Age of Onset  . Heart disease Father   . Cancer Father     unsure what kind    History   Social History  . Marital Status: Married    Spouse Name: N/A    Number of Children: Y  . Years of Education: N/A   Occupational History  . Curatormechanic Unemployed   Social History Main Topics  . Smoking status: Never Smoker   . Smokeless tobacco: Current User    Types: Snuff  . Alcohol Use: Not on file  . Drug Use: Not on file  . Sexual Activity: Not on file   Other Topics Concern  . Not on file   Social History Narrative    Allergies  Allergen Reactions  . Gemfibrozil     REACTION: diarrhea     Constitutional: Denies headache, fatigue, fever or abrupt weight changes.  HEENT:  Denies eye redness, eye pain, pressure behind the eyes, facial pain, nasal congestion, ear pain, ringing in the ears, wax buildup, runny nose or sore throat. Respiratory: Positive cough. Denies difficulty breathing or shortness of breath.  Cardiovascular: Denies chest pain, chest tightness, palpitations or swelling in the hands or feet.   No other specific complaints in a complete review of systems (except as listed in HPI above).  Objective:   BP 136/88 mmHg  Pulse 85  Temp(Src) 98.8 F (37.1 C) (Oral)  Wt 218 lb 8 oz (99.111 kg)  SpO2 98% Wt Readings from Last 3 Encounters:  08/17/14 218 lb 8 oz (99.111 kg)  07/15/14 210 lb (95.255  kg)  06/28/14 212 lb 4 oz (96.276 kg)     General: Appears his stated age, well developed, well nourished in NAD. HEENT: Head: normal shape and size, no sinus tenderness noted; Eyes: sclera white, no icterus, conjunctiva pinkt; Ears: Tm's gray and intact, normal light reflex; Nose: mucosa pink and moist, septum midline; Throat/Mouth: Teeth present, mucosa pink and moist, no exudate noted, no lesions or ulcerations noted.  Neck: No lymphadenopathy. Cardiovascular: Normal rate and rhythm. S1,S2 noted.  No murmur, rubs or gallops noted.  Pulmonary/Chest: Normal effort and positive vesicular breath sounds. No respiratory distress. No wheezes, rales or ronchi noted.      Assessment & Plan:   Upper Respiratory Infection:  Likely viral at this point Get some rest and drink plenty of water Start Mucinex OTC Continue Nyquil  RTC as needed or if symptoms persist.

## 2014-08-17 NOTE — Patient Instructions (Signed)
Upper Respiratory Infection, Adult An upper respiratory infection (URI) is also sometimes known as the common cold. The upper respiratory tract includes the nose, sinuses, throat, trachea, and bronchi. Bronchi are the airways leading to the lungs. Most people improve within 1 week, but symptoms can last up to 2 weeks. A residual cough may last even longer.  CAUSES Many different viruses can infect the tissues lining the upper respiratory tract. The tissues become irritated and inflamed and often become very moist. Mucus production is also common. A cold is contagious. You can easily spread the virus to others by oral contact. This includes kissing, sharing a glass, coughing, or sneezing. Touching your mouth or nose and then touching a surface, which is then touched by another person, can also spread the virus. SYMPTOMS  Symptoms typically develop 1 to 3 days after you come in contact with a cold virus. Symptoms vary from person to person. They may include:  Runny nose.  Sneezing.  Nasal congestion.  Sinus irritation.  Sore throat.  Loss of voice (laryngitis).  Cough.  Fatigue.  Muscle aches.  Loss of appetite.  Headache.  Low-grade fever. DIAGNOSIS  You might diagnose your own cold based on familiar symptoms, since most people get a cold 2 to 3 times a year. Your caregiver can confirm this based on your exam. Most importantly, your caregiver can check that your symptoms are not due to another disease such as strep throat, sinusitis, pneumonia, asthma, or epiglottitis. Blood tests, throat tests, and X-rays are not necessary to diagnose a common cold, but they may sometimes be helpful in excluding other more serious diseases. Your caregiver will decide if any further tests are required. RISKS AND COMPLICATIONS  You may be at risk for a more severe case of the common cold if you smoke cigarettes, have chronic heart disease (such as heart failure) or lung disease (such as asthma), or if  you have a weakened immune system. The very young and very old are also at risk for more serious infections. Bacterial sinusitis, middle ear infections, and bacterial pneumonia can complicate the common cold. The common cold can worsen asthma and chronic obstructive pulmonary disease (COPD). Sometimes, these complications can require emergency medical care and may be life-threatening. PREVENTION  The best way to protect against getting a cold is to practice good hygiene. Avoid oral or hand contact with people with cold symptoms. Wash your hands often if contact occurs. There is no clear evidence that vitamin C, vitamin E, echinacea, or exercise reduces the chance of developing a cold. However, it is always recommended to get plenty of rest and practice good nutrition. TREATMENT  Treatment is directed at relieving symptoms. There is no cure. Antibiotics are not effective, because the infection is caused by a virus, not by bacteria. Treatment may include:  Increased fluid intake. Sports drinks offer valuable electrolytes, sugars, and fluids.  Breathing heated mist or steam (vaporizer or shower).  Eating chicken soup or other clear broths, and maintaining good nutrition.  Getting plenty of rest.  Using gargles or lozenges for comfort.  Controlling fevers with ibuprofen or acetaminophen as directed by your caregiver.  Increasing usage of your inhaler if you have asthma. Zinc gel and zinc lozenges, taken in the first 24 hours of the common cold, can shorten the duration and lessen the severity of symptoms. Pain medicines may help with fever, muscle aches, and throat pain. A variety of non-prescription medicines are available to treat congestion and runny nose. Your caregiver   can make recommendations and may suggest nasal or lung inhalers for other symptoms.  HOME CARE INSTRUCTIONS   Only take over-the-counter or prescription medicines for pain, discomfort, or fever as directed by your  caregiver.  Use a warm mist humidifier or inhale steam from a shower to increase air moisture. This may keep secretions moist and make it easier to breathe.  Drink enough water and fluids to keep your urine clear or pale yellow.  Rest as needed.  Return to work when your temperature has returned to normal or as your caregiver advises. You may need to stay home longer to avoid infecting others. You can also use a face mask and careful hand washing to prevent spread of the virus. SEEK MEDICAL CARE IF:   After the first few days, you feel you are getting worse rather than better.  You need your caregiver's advice about medicines to control symptoms.  You develop chills, worsening shortness of breath, or brown or red sputum. These may be signs of pneumonia.  You develop yellow or brown nasal discharge or pain in the face, especially when you bend forward. These may be signs of sinusitis.  You develop a fever, swollen neck glands, pain with swallowing, or white areas in the back of your throat. These may be signs of strep throat. SEEK IMMEDIATE MEDICAL CARE IF:   You have a fever.  You develop severe or persistent headache, ear pain, sinus pain, or chest pain.  You develop wheezing, a prolonged cough, cough up blood, or have a change in your usual mucus (if you have chronic lung disease).  You develop sore muscles or a stiff neck. Document Released: 01/09/2001 Document Revised: 10/08/2011 Document Reviewed: 10/21/2013 [employer] Patient Information 2015 [employer]. This information is not intended to replace advice given to you by your health care provider. Make sure you discuss any questions you have with your health care provider.  

## 2014-08-17 NOTE — Progress Notes (Signed)
Pre visit review using our clinic review tool, if applicable. No additional management support is needed unless otherwise documented below in the visit note. 

## 2014-08-26 ENCOUNTER — Encounter: Payer: Self-pay | Admitting: Internal Medicine

## 2014-08-26 ENCOUNTER — Ambulatory Visit (INDEPENDENT_AMBULATORY_CARE_PROVIDER_SITE_OTHER): Payer: 59 | Admitting: Internal Medicine

## 2014-08-26 VITALS — BP 126/72 | HR 88 | Temp 98.3°F | Resp 12 | Wt 219.0 lb

## 2014-08-26 DIAGNOSIS — E1121 Type 2 diabetes mellitus with diabetic nephropathy: Secondary | ICD-10-CM

## 2014-08-26 DIAGNOSIS — E1129 Type 2 diabetes mellitus with other diabetic kidney complication: Secondary | ICD-10-CM

## 2014-08-26 DIAGNOSIS — IMO0002 Reserved for concepts with insufficient information to code with codable children: Secondary | ICD-10-CM

## 2014-08-26 DIAGNOSIS — E1165 Type 2 diabetes mellitus with hyperglycemia: Secondary | ICD-10-CM

## 2014-08-26 MED ORDER — INSULIN REGULAR HUMAN (CONC) 500 UNIT/ML ~~LOC~~ SOLN: SUBCUTANEOUS | Status: DC

## 2014-08-26 MED ORDER — "INSULIN SYRINGE-NEEDLE U-100 31G X 5/16"" 0.5 ML MISC": Status: DC

## 2014-08-26 NOTE — Progress Notes (Signed)
Patient ID: Martin Downs, male   DOB: 01/06/1966, 49 y.o.   MRN: [id number]  HPI: Martin Downs is a 49 y.o.-year-old male, returning for f/u for DM2 dx 2014, insulin-dependent since 02/2014, uncontrolled, with complications (mild CKD). Last visit 1.5 mo ago.  Last hemoglobin A1c was: Lab Results  Component Value Date   HGBA1C 11.2* 05/31/2014   HGBA1C 12.5* 02/11/2014   HGBA1C 11.9* 05/13/2013   Pt is on a regimen of: - Metformin 1000 mg bid - Levemir 40 units 2x a day  - NovoLog 15 min before a meal:  - 30 units before a smaller meal - 35 units before a larger meal    Novolog 15 units for a snack. We stopped Actoplus 15-850 mg in 02/2014. We stopped Glipizide when we increased Novolog.  Pt started to check sugars after last visit - sugars still high - 200-400s, but occasional lows, 50-88, at different times of the day, but not usually in the morning. I reviewed his sugar log and the sugars are not much changed from last visit the sugars from last visit: - am: 203-377 >> 120, 236-373 >> 170-356 (not fasting) - 2h after b'fast: n/c >> 284-365, 441, 481 >> 210-419 - lunch: n/c >> 198-400 >> 200-305 - 2h after lunch: 399 >> 241-338, 386 >> 280-421 - dinner: 242-345 >> 165-294 >> 219--241 - 2h after dinner: n/c >> 175-307 >> 237-349 - bedtime: 217-425 >> 163, 220-377 >> 208-340 - nighttime: 88 (sweating), 187-443  Pt's meals are - grazes  - Breakfast: oatmeal or yoghurt or fruit bar/cup (largest meal) - Lunch: PB sandwich - Dinner: Publishing copychicken leftovers - Snacks: sandwich, 1 pack of crackers, diet Mountain dew  - Mild CKD, last BUN/creatinine:  Lab Results  Component Value Date   BUN 14 05/31/2014   CREATININE 0.9 05/31/2014  Not on an ACEI. - last set of lipids: Lab Results  Component Value Date   CHOL 151 02/11/2014   HDL 42.20 02/11/2014   LDLCALC 73 02/11/2014   LDLDIRECT 98.6 04/03/2013   TRIG 181.0* 02/11/2014   CHOLHDL 4 02/11/2014  On Crestor and  Fenofibrate. - last eye exam was 2 years ago! No DR.  - no numbness and tingling in his feet.  I reviewed pt's medications, allergies, PMH, social hx, family hx, and changes were documented in the history of present illness. Otherwise, unchanged from my initial visit note..  ROS: Constitutional: no weight gain/loss, + fatigue, no subjective hyperthermia/hypothermia, + poor sleep Eyes: no blurry vision, no xerophthalmia ENT: no sore throat, no nodules palpated in throat, no dysphagia/odynophagia, no hoarseness Cardiovascular: no CP/SOB/palpitations/leg swelling Respiratory: no cough/SOB Gastrointestinal: no N/V/D/C Musculoskeletal: no muscle/joint aches Skin: no rashes Neurological: no tremors/numbness/tingling/dizziness  PE: BP 126/72 mmHg  Pulse 88  Temp(Src) 98.3 F (36.8 C) (Oral)  Resp 12  Wt 219 lb (99.338 kg)  SpO2 96% Body mass index is 32.33 kg/(m^2).  Wt Readings from Last 3 Encounters:  08/26/14 219 lb (99.338 kg)  08/17/14 218 lb 8 oz (99.111 kg)  07/15/14 210 lb (95.255 kg)  Constitutional: overweight, in NAD Eyes: PERRLA, EOMI, no exophthalmos ENT: moist mucous membranes, no thyromegaly, no cervical lymphadenopathy Cardiovascular: RRR, No MRG Respiratory: CTA B Gastrointestinal: abdomen soft, NT, ND, BS+ Musculoskeletal: no deformities, strength intact in all 4 Skin: moist, warm, no rashes Neurological: no tremor with outstretched hands, DTR normal in all 4  ASSESSMENT: 1. 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 Patient with ~1 year h/o uncontrolled diabetes, with increased glucotoxicity. Sugars are still very high. He eats all the time, and brings a log, however, he mixes the pre-meal and postmeal values together, so it is difficult to repeat. However, he tells me that he is almost never fasting before a meal,  he always has a snack within 2 hours of the meal... I believe he needs U500 insulin. We discussed at length about how it works, the fact that it is 5 times more concentrated, so whenever he gets 1 unit of this insulin he actually injects 5 units of the insulin that he is using now. I demonstrated how to fill it up in the syringe.  - I suggested to start with the following doses: Patient Instructions  Please stop Levemir and NovoLog and start U500 insulin - 30 min before a meal: - 0.16 ml (16 units) before b'fast - 0.12 mL (12 units) before lunch - 0.12 mL (12 units) before dinner)  Please return in 1 month with your sugar log.   Please check sugars 4x a day, before meals and at bedtime >> bring log at next visit   - Strongly advised him to start checking sugars at different times of the day - check 3 times a day, rotating checks - advised for yearly eye exams - he is due! - Return to clinic in 1 mo with sugar log. We will need a hemoglobin A1c in next visit  - time spent with the patient: 25 min, of which >50% was spent in reviewing his sugar log, discussing his hypo- and hyper-glycemic episodes, reviewing  previous labs and insulin doses and advising him about U500 insulin use, demonstrating injection technique, reviewing his meals, and calculating required doses.

## 2014-08-26 NOTE — Patient Instructions (Signed)
Please stop Levemir and NovoLog and start U500 insulin - 30 min before a meal: - 0.16 ml (16 units) before b'fast - 0.12 mL (12 units) before lunch - 0.12 mL (12 units) before dinner)  Please return in 1 month with your sugar log.   Please check sugars 4x a day, before meals and at bedtime >> bring log at next visit.

## 2014-08-31 ENCOUNTER — Telehealth: Payer: Self-pay | Admitting: Internal Medicine

## 2014-08-31 MED ORDER — ONETOUCH DELICA LANCETS 33G MISC: Status: DC

## 2014-08-31 NOTE — Telephone Encounter (Signed)
Patient states he would need the Rx changed for his lancets  Please change from 2 to 8 due to testing   Pharmacy: [facility] [address]   Please advise   Thank You

## 2014-09-27 ENCOUNTER — Ambulatory Visit: Payer: 59 | Admitting: Internal Medicine

## 2014-10-19 ENCOUNTER — Ambulatory Visit (INDEPENDENT_AMBULATORY_CARE_PROVIDER_SITE_OTHER): Payer: 59 | Admitting: Internal Medicine

## 2014-10-19 ENCOUNTER — Encounter: Payer: Self-pay | Admitting: Internal Medicine

## 2014-10-19 VITALS — BP 114/64 | HR 80 | Temp 98.7°F | Resp 12 | Wt 215.6 lb

## 2014-10-19 DIAGNOSIS — E1165 Type 2 diabetes mellitus with hyperglycemia: Secondary | ICD-10-CM

## 2014-10-19 DIAGNOSIS — IMO0002 Reserved for concepts with insufficient information to code with codable children: Secondary | ICD-10-CM

## 2014-10-19 DIAGNOSIS — E1129 Type 2 diabetes mellitus with other diabetic kidney complication: Secondary | ICD-10-CM

## 2014-10-19 DIAGNOSIS — E1121 Type 2 diabetes mellitus with diabetic nephropathy: Secondary | ICD-10-CM

## 2014-10-19 LAB — HEMOGLOBIN A1C: Hgb A1c MFr Bld: 9.7 % — ABNORMAL HIGH (ref 4.6–6.5)

## 2014-10-19 MED ORDER — INSULIN REGULAR HUMAN (CONC) 500 UNIT/ML ~~LOC~~ SOLN: SUBCUTANEOUS | Status: DC

## 2014-10-19 NOTE — Patient Instructions (Signed)
Please take U500 insulin 3x a day - 30 min before a meal: - 0.05 ml (5 units) before a smaller meal - 0.08 mL (8 units) before a larger meal  Please return in 1.5 month with your sugar log.   Please stop at the lab.

## 2014-10-19 NOTE — Progress Notes (Signed)
Patient ID: Martin Downs, male   DOB: May 23, 1966, 49 y.o.   MRN: [id number]  HPI: Martin Downs is a 49 y.o.-year-old male, returning for f/u for DM2 dx 2014, insulin-dependent since 02/2014, uncontrolled, with complications (mild CKD). Last visit 1.5 mo ago.  Last hemoglobin A1c was: Lab Results  Component Value Date   HGBA1C 11.2* 05/31/2014   HGBA1C 12.5* 02/11/2014   HGBA1C 11.9* 05/13/2013   Pt was on a regimen of: - Metformin 1000 mg bid - Levemir 40 units 2x a day  - NovoLog 15 min before a meal:  - 30 units before a smaller meal - 35 units before a larger meal    Novolog 15 units for a snack. We stopped Actoplus 15-850 mg in 02/2014. We stopped Glipizide when we increased Novolog.  At last visit, we started U500 insulin - 30 min before a meal - just once a day - 0.12 mL (12 units) before a smaller meal - 0.13 mL (13 units) before a larger meal - Metformin 1000 mg bid  Pt checks sugars 3x a day - better later in the day. Higher sugars when he forgets Metformin: - am: 203-377 >> 120, 236-373 >> 170-356 (not fasting) >> 208-380 - 2h after b'fast: n/c >> 284-365, 441, 481 >> 210-419 >> 166-395 (highs when he forgets metformin) - lunch: n/c >> 198-400 >> 200-305 >> 175-298 - 2h after lunch: 399 >> 241-338, 386 >> 280-421 >> 149-199, 390 - dinner: 242-345 >> 165-294 >> 219--241 >> 147-220 - 2h after dinner: n/c >> 175-307 >> 237-349 >> 155-314 - bedtime: 217-425 >> 163, 220-377 >> 208-340 >> 166-270 - nighttime: 88 (sweating), 187-443 >> 394  Pt's meals are - grazes  - Breakfast: oatmeal or yoghurt or fruit bar/cup (largest meal) - Lunch: PB sandwich - Dinner: chicken leftovers - Snacks: sandwich, 1 pack of crackers, diet Mountain dew  - Mild CKD, last BUN/creatinine:  Lab Results  Component Value Date   BUN 14 05/31/2014   CREATININE 0.9 05/31/2014  Not on an ACEI. - last set of lipids: Lab Results  Component Value Date   CHOL 151 02/11/2014   HDL  42.20 02/11/2014   LDLCALC 73 02/11/2014   LDLDIRECT 98.6 04/03/2013   TRIG 181.0* 02/11/2014   CHOLHDL 4 02/11/2014  On Crestor and Fenofibrate. - last eye exam was 2 years ago! No DR.  - no numbness and tingling in his feet.  I reviewed pt's medications, allergies, PMH, social hx, family hx, and changes were documented in the history of present illness. Otherwise, unchanged from my initial visit note..  ROS: Constitutional: no weight gain/loss, + fatigue, no subjective hyperthermia/hypothermia, + poor sleep Eyes: no blurry vision, no xerophthalmia ENT: no sore throat, no nodules palpated in throat, no dysphagia/odynophagia, no hoarseness Cardiovascular: no CP/SOB/palpitations/leg swelling Respiratory: no cough/SOB Gastrointestinal: no N/V/D/C Musculoskeletal: no muscle/joint aches Skin: no rashes Neurological: no tremors/numbness/tingling/dizziness  PE: BP 114/64 mmHg  Pulse 80  Temp(Src) 98.7 F (37.1 C) (Oral)  Resp 12  Wt 215 lb 9.6 oz (97.796 kg)  SpO2 95% Body mass index is 31.82 kg/(m^2).  Wt Readings from Last 3 Encounters:  10/19/14 215 lb 9.6 oz (97.796 kg)  08/26/14 219 lb (99.338 kg)  08/17/14 218 lb 8 oz (99.111 kg)  Constitutional: overweight, in NAD Eyes: PERRLA, EOMI, no exophthalmos ENT: moist mucous membranes, no thyromegaly, no cervical lymphadenopathy Cardiovascular: RRR, No MRG Respiratory: CTA B Gastrointestinal: abdomen soft, NT, ND, BS+ Musculoskeletal: no deformities, strength intact in  all 4 Skin: moist, warm, no rashes Neurological: no tremor with outstretched hands, DTR normal in all 4  ASSESSMENT: 1. DM2, insulin-dependent, uncontrolled, with complications - mild ckd  PLAN:  1. Patient with ~1 year h/o uncontrolled diabetes, with increased glucotoxicity. Sugars improved on the U500 insulin, despite the fact that he is taking it only in am as he can drop his sugars if taking it more frequently. We need to reduce the doses and have him  start taking it 3x a day. - I suggested to:  Patient Instructions  Please take U500 insulin 3x a day - 30 min before a meal: - 0.05 ml (5 units) before a smaller meal - 0.08 mL (8 units) before a larger meal  Please return in 1.5 month with your sugar log.   Please stop at the lab.   - continue checking sugars at different times of the day - check 3 times a day, rotating checks - advised for yearly eye exams - he is due! - check HbA1c today - Return to clinic in 1.5 mo with sugar log.   Office Visit on 10/19/2014  Component Date Value Ref Range Status  . Hgb A1c MFr Bld 10/19/2014 9.7* 4.6 - 6.5 % Final   Glycemic Control Guidelines for People with Diabetes:Non Diabetic:  <6%Goal of Therapy: <7%Additional Action Suggested:  >8%    HbA1c improved despite reducing the dose of insulin!!!

## 2014-10-26 ENCOUNTER — Other Ambulatory Visit: Payer: Self-pay | Admitting: Internal Medicine

## 2014-10-27 ENCOUNTER — Other Ambulatory Visit: Payer: Self-pay | Admitting: Family Medicine

## 2014-10-27 ENCOUNTER — Other Ambulatory Visit: Payer: Self-pay | Admitting: Internal Medicine

## 2014-11-01 ENCOUNTER — Other Ambulatory Visit: Payer: Self-pay | Admitting: Family Medicine

## 2014-11-01 ENCOUNTER — Other Ambulatory Visit (INDEPENDENT_AMBULATORY_CARE_PROVIDER_SITE_OTHER): Payer: 59

## 2014-11-01 DIAGNOSIS — E785 Hyperlipidemia, unspecified: Secondary | ICD-10-CM

## 2014-11-01 LAB — COMPREHENSIVE METABOLIC PANEL
ALBUMIN: 4.3 g/dL (ref 3.5–5.2)
ALK PHOS: 63 U/L (ref 39–117)
ALT: 27 U/L (ref 0–53)
AST: 19 U/L (ref 0–37)
BILIRUBIN TOTAL: 0.6 mg/dL (ref 0.2–1.2)
BUN: 14 mg/dL (ref 6–23)
CO2: 33 mEq/L — ABNORMAL HIGH (ref 19–32)
Calcium: 9.8 mg/dL (ref 8.4–10.5)
Chloride: 101 mEq/L (ref 96–112)
Creatinine, Ser: 0.71 mg/dL (ref 0.40–1.50)
GFR: 124.56 mL/min (ref >60.00)
Glucose, Bld: 195 mg/dL — ABNORMAL HIGH (ref 70–99)
POTASSIUM: 4.9 meq/L (ref 3.5–5.1)
Sodium: 137 mEq/L (ref 135–145)
Total Protein: 6.7 g/dL (ref 6.0–8.3)

## 2014-11-01 LAB — LIPID PANEL
CHOL/HDL RATIO: 6
Cholesterol: 213 mg/dL — ABNORMAL HIGH (ref 0–200)
HDL: 37.3 mg/dL — ABNORMAL LOW (ref >39.00)
NONHDL: 175.7
Triglycerides: 330 mg/dL — ABNORMAL HIGH (ref 0.0–149.0)
VLDL: 66 mg/dL — AB (ref 0.0–40.0)

## 2014-11-01 LAB — LDL CHOLESTEROL, DIRECT: Direct LDL: 136 mg/dL

## 2014-12-03 ENCOUNTER — Ambulatory Visit: Payer: 59 | Admitting: Internal Medicine

## 2014-12-06 ENCOUNTER — Encounter: Payer: Self-pay | Admitting: Internal Medicine

## 2014-12-06 ENCOUNTER — Ambulatory Visit (INDEPENDENT_AMBULATORY_CARE_PROVIDER_SITE_OTHER): Payer: 59 | Admitting: Internal Medicine

## 2014-12-06 VITALS — BP 124/76 | HR 87 | Temp 98.1°F | Resp 12 | Wt 216.0 lb

## 2014-12-06 DIAGNOSIS — E1129 Type 2 diabetes mellitus with other diabetic kidney complication: Secondary | ICD-10-CM | POA: Diagnosis not present

## 2014-12-06 DIAGNOSIS — E1121 Type 2 diabetes mellitus with diabetic nephropathy: Secondary | ICD-10-CM

## 2014-12-06 DIAGNOSIS — E1165 Type 2 diabetes mellitus with hyperglycemia: Secondary | ICD-10-CM | POA: Diagnosis not present

## 2014-12-06 DIAGNOSIS — IMO0002 Reserved for concepts with insufficient information to code with codable children: Secondary | ICD-10-CM

## 2014-12-06 NOTE — Progress Notes (Signed)
Patient ID: Martin Downs, male   DOB: 11/23/1965, 49 y.o.   MRN: [id number]  HPI: Martin ChyleJerry Thomas Downs is a 49 y.o.-year-old male, returning for f/u for DM2 dx 2014, insulin-dependent since 02/2014, uncontrolled, with complications (mild CKD). Last visit 1.5 mo ago.  Last hemoglobin A1c was: Lab Results  Component Value Date   HGBA1C 9.7* 10/19/2014   HGBA1C 11.2* 05/31/2014   HGBA1C 12.5* 02/11/2014   Pt was on a regimen of: - Metformin 1000 mg bid - Levemir 40 units 2x a day  - NovoLog 15 min before a meal:  - 30 units before a smaller meal - 35 units before a larger meal    Novolog 15 units for a snack. We stopped Actoplus 15-850 mg in 02/2014. We stopped Glipizide when we increased Novolog.  At last visit, we started U500 insulin - 30 min before a meal - at last visit, was taking it just once a day as he was having lower sugars later in the day if took it 2x - 0.05 mL (5 units) 3x a day - Metformin 1000 mg bid  Pt checks sugars 3x a day - all hugh: - am: 203-377 >> 120, 236-373 >> 170-356 (not fasting) >> 208-380 >> 196-222 - 2h after b'fast: n/c >> 284-365, 441, 481 >> 210-419 >> 166-395 >> 216-244 - lunch: n/c >> 198-400 >> 200-305 >> 175-298 >> 204-237 - 2h after lunch: 399 >> 241-338, 386 >> 280-421 >> 149-199, 390 >> 228-261 - dinner: 242-345 >> 165-294 >> 219--241 >> 147-220 >> 218-240 - 2h after dinner: n/c >> 175-307 >> 237-349 >> 155-314 >> 228-260 - bedtime: 217-425 >> 163, 220-377 >> 208-340 >> 166-270 >> 207-225 - nighttime: 88 (sweating), 187-443 >> 394 >> 190-240  Pt's meals are - grazes  - Breakfast: oatmeal or yoghurt or fruit bar/cup (largest meal) - Lunch: PB sandwich - Dinner: chicken leftovers - Snacks: sandwich, 1 pack of crackers, diet Mountain dew  - Mild CKD, last BUN/creatinine:  Lab Results  Component Value Date   BUN 14 11/01/2014   CREATININE 0.71 11/01/2014  Not on an ACEI. - last set of lipids: Lab Results  Component Value Date   CHOL 213* 11/01/2014   HDL 37.30* 11/01/2014   LDLCALC 73 02/11/2014   LDLDIRECT 136.0 11/01/2014   TRIG 330.0* 11/01/2014   CHOLHDL 6 11/01/2014  On Crestor and Fenofibrate. - last eye exam was 2 years ago! No DR. Has one scheduled for next week. - no numbness and tingling in his feet.  I reviewed pt's medications, allergies, PMH, social hx, family hx, and changes were documented in the history of present illness. Otherwise, unchanged from my initial visit note..  ROS: Constitutional: no weight gain/loss, no fatigue, no subjective hyperthermia/hypothermia Eyes: no blurry vision, no xerophthalmia ENT: no sore throat, no nodules palpated in throat, no dysphagia/odynophagia, no hoarseness Cardiovascular: no CP/SOB/palpitations/leg swelling Respiratory: no cough/SOB Gastrointestinal: no N/V/D/C Musculoskeletal: no muscle/joint aches Skin: no rashes Neurological: no tremors/numbness/tingling/dizziness  PE: BP 124/76 mmHg  Pulse 87  Temp(Src) 98.1 F (36.7 C) (Oral)  Resp 12  Wt 216 lb (97.977 kg)  SpO2 96% Body mass index is 31.88 kg/(m^2).  Wt Readings from Last 3 Encounters:  12/06/14 216 lb (97.977 kg)  10/19/14 215 lb 9.6 oz (97.796 kg)  08/26/14 219 lb (99.338 kg)  Constitutional: overweight, in NAD Eyes: PERRLA, EOMI, no exophthalmos ENT: moist mucous membranes, no thyromegaly, no cervical lymphadenopathy Cardiovascular: RRR, No MRG Respiratory: CTA B Gastrointestinal: abdomen soft,  NT, ND, BS+ Musculoskeletal: no deformities, strength intact in all 4 Skin: moist, warm, no rashes Neurological: no tremor with outstretched hands, DTR normal in all 4  ASSESSMENT: 1. DM2, insulin-dependent, uncontrolled, with complications - mild ckd  PLAN:  1. Patient with h/o uncontrolled diabetes, with increased insulin resistance. Sugars improved on the U500 insulin, despite still suboptimal dosing. He has had sugars in the 200s since last visit but did not use the higher dose of  U500 that I recommended. We will increase the doses now. We discussed about the U500 pen, but he still has vials at home - we can switch at next visit or when he runs out. - I suggested to:  Patient Instructions  Please take U500 insulin 3x a day - 30 min before a meal and increase to 0.08 mL (8 units) 3x a day. If sugars still >130 before meals and >180 after meals, if there are no lows, please increase the dose to 0.10 mL (10 units) 3x a day.  Please call me in 2 weeks with your sugars.   Please return in 3 months with your sugar log.    - continue checking sugars at different times of the day - check 3 times a day, rotating checks - advised for yearly eye exams - he will have one soon - check HbA1c at next visit - Return to clinic in 3 mo with sugar log.

## 2014-12-06 NOTE — Patient Instructions (Signed)
Please take U500 insulin 3x a day - 30 min before a meal and increase to 0.08 mL (8 units) 3x a day. If sugars still >130 before meals and >180 after meals, if there are no lows, please increase the dose to 0.10 mL (10 units) 3x a day.  Please call me in 2 weeks with your sugars.   Please return in 3 months with your sugar log.

## 2014-12-09 LAB — HM DIABETES EYE EXAM

## 2014-12-10 ENCOUNTER — Encounter: Payer: Self-pay | Admitting: Internal Medicine

## 2014-12-13 ENCOUNTER — Ambulatory Visit (INDEPENDENT_AMBULATORY_CARE_PROVIDER_SITE_OTHER): Payer: 59 | Admitting: Family Medicine

## 2014-12-13 ENCOUNTER — Encounter: Payer: Self-pay | Admitting: Family Medicine

## 2014-12-13 VITALS — BP 124/66 | HR 82 | Temp 98.3°F | Wt 222.2 lb

## 2014-12-13 DIAGNOSIS — E1129 Type 2 diabetes mellitus with other diabetic kidney complication: Secondary | ICD-10-CM

## 2014-12-13 DIAGNOSIS — E785 Hyperlipidemia, unspecified: Secondary | ICD-10-CM

## 2014-12-13 DIAGNOSIS — E1121 Type 2 diabetes mellitus with diabetic nephropathy: Secondary | ICD-10-CM

## 2014-12-13 DIAGNOSIS — E1165 Type 2 diabetes mellitus with hyperglycemia: Secondary | ICD-10-CM | POA: Diagnosis not present

## 2014-12-13 DIAGNOSIS — IMO0002 Reserved for concepts with insufficient information to code with codable children: Secondary | ICD-10-CM

## 2014-12-13 MED ORDER — ROSUVASTATIN CALCIUM 10 MG PO TABS: 10.0000 mg | ORAL_TABLET | Freq: Every day | ORAL | Status: DC

## 2014-12-13 MED ORDER — FENOFIBRATE 160 MG PO TABS: ORAL_TABLET | ORAL | Status: DC

## 2014-12-13 NOTE — Assessment & Plan Note (Signed)
Deteriorated and not at goal for a diabetic. >15 minutes spent in face to face time with patient, >50% spent in counselling or coordination of care. Admits to not taking cholesterol rx.  Fenofibrate and crestor rxs refilled. Follow up labs in 8 weeks. The patient indicates understanding of these issues and agrees with the plan.

## 2014-12-13 NOTE — Progress Notes (Signed)
49 yo male with h/o depression, chronic pain, hypothyroidism, DM, HLD here for 3 month follow up;  H/o narcotic dependence, I no longer refill his narcotics.  On methadone, seen at [facility].  DM- has not been well controlled. Followed by Dr. Cruzita Lederer- last saw her on 5/9- note reviewed. U500 insulin dose increased   Wt Readings from Last 3 Encounters:  12/13/14 222 lb 4 oz (100.812 kg)  12/06/14 216 lb (97.977 kg)  10/19/14 215 lb 9.6 oz (97.796 kg)     Lab Results  Component Value Date   HGBA1C 9.7* 10/19/2014   Cholesterol has deteriorated.  No longer at goal for a diabetic.  Was supposed to be taking Crestor and fenofibrate but he thinks he has not been taking either--"not sure why--I think I ran out.:"  Lab Results  Component Value Date   CHOL 213* 11/01/2014   HDL 37.30* 11/01/2014   LDLCALC 73 02/11/2014   LDLDIRECT 136.0 11/01/2014   TRIG 330.0* 11/01/2014   CHOLHDL 6 11/01/2014    Wt Readings from Last 3 Encounters:  12/13/14 222 lb 4 oz (100.812 kg)  12/06/14 216 lb (97.977 kg)  10/19/14 215 lb 9.6 oz (97.796 kg)     Patient Active Problem List   Diagnosis Date Noted  . Elevated liver function tests 06/28/2014  . Uncontrolled type 2 diabetes mellitus with nephropathy 05/31/2014  . Family history of premature CAD 02/11/2014  . DOE (dyspnea on exertion) 02/11/2014  . Hypersomnia 11/23/2011  . Anxiety 11/05/2011  . Depression 08/23/2011  . Chronic pain 06/27/2011  . Narcotic dependence 11/16/2010  . GENERALIZED ANXIETY DISORDER 07/26/2010  . HYPOTHYROIDISM 01/17/2010  . TESTICULAR HYPOFUNCTION 11/07/2009  . HERPES LABIALIS 11/03/2009  . PURE HYPERCHOLESTEROLEMIA 05/26/2009  . THYROID STIMULATING HORMONE, ABNORMAL 04/06/2009  . FATIGUE 03/30/2009  . TOBACCO ABUSE 12/30/2008  . HLD (hyperlipidemia) 12/04/2006  . GOUT 12/04/2006  . INSOMNIA 12/04/2006   Past Medical History  Diagnosis Date  . Hyperlipidemia   . Sleep apnea   . Borderline diabetes    . Anxiety   . Chronic pain   . Depression   . Narcotic dependence    Past Surgical History  Procedure Laterality Date  . Removal of bone spur     History  Substance Use Topics  . Smoking status: Never Smoker   . Smokeless tobacco: Current User    Types: Snuff  . Alcohol Use: Not on file   Family History  Problem Relation Age of Onset  . Heart disease Father   . Cancer Father     unsure what kind   Allergies  Allergen Reactions  . Gemfibrozil     REACTION: diarrhea   Current Outpatient Prescriptions on File Prior to Visit  Medication Sig Dispense Refill  . aspirin 81 MG tablet Take 81 mg by mouth daily.      . Blood Glucose Monitoring Suppl (ONE TOUCH ULTRA MINI) W/DEVICE KIT Use as directed to test blood sugar twice daily 250.00 1 each 0  . colchicine 0.6 MG tablet Take 0.6 mg by mouth daily as needed. For gout flare ups Takes 1 tab daily until symptoms subsides    . CRESTOR 10 MG tablet TAKE 1 TABLET BY MOUTH EVERY DAY 30 tablet 5  . fenofibrate 160 MG tablet TAKE 1 TABLET (160 MG TOTAL) BY MOUTH DAILY. 30 tablet 5  . fluticasone (FLONASE) 50 MCG/ACT nasal spray PLACE 2 SPRAYS INTO BOTH NOSTRILS DAILY. 16 g 3  . glucose blood (ONE TOUCH ULTRA  TEST) test strip Use to test blood sugar 8 times daily as instructed. 300 each 11  . Insulin Pen Needle 32G X 4 MM MISC Use to inject insulin 5 times daily as instructed. 150 each 5  . insulin regular human CONCENTRATED (HUMULIN R) 500 UNIT/ML SOLN injection Take as advised 0.30 mL daily divided in 3 doses 20 mL 2  . insulin regular human CONCENTRATED (HUMULIN R) 500 UNIT/ML SOLN injection Inject under skin 0.05 - 0.08 ml before a meal 3x a day 20 mL 1  . Insulin Syringe-Needle U-100 (B-D INS SYRINGE 0.5CC/31GX5/16) 31G X 5/16" 0.5 ML MISC Use 3x a day 100 each 1  . metFORMIN (GLUCOPHAGE) 1000 MG tablet TAKE 1 TABLET (1,000 MG TOTAL) BY MOUTH 2 (TWO) TIMES DAILY WITH A MEAL. 180 tablet 1  . methadone (DOLOPHINE) 10 MG/ML solution  Take 105 mg by mouth daily.     . Multiple Vitamin (MULTIVITAMIN) tablet Take 2 tablets by mouth 2 (two) times daily.    Glory Rosebush DELICA LANCETS 81E MISC Use to test blood sugar 4 times daily as instructed. 200 each 5  . valACYclovir (VALTREX) 500 MG tablet Take 500 mg by mouth 2 (two) times daily as needed. For cold sore flare ups Takes twice daily until cold sore clears up    . vitamin B-12 (CYANOCOBALAMIN) 1000 MCG tablet Take 1,000 mcg by mouth daily.     No current facility-administered medications on file prior to visit.     The PMH, PSH, Social History, Family History, Medications, and allergies have been reviewed in [facility], and have been updated if relevant.   Review of Systems    Review of Systems  Constitutional: Negative.   HENT: Negative.   Respiratory: Negative.   Cardiovascular: Negative.   Gastrointestinal: Negative.   Musculoskeletal: Negative.   Skin: Negative.   All other systems reviewed and are negative.    Physical Exam BP 124/66 mmHg  Pulse 82  Temp(Src) 98.3 F (36.8 C) (Oral)  Wt 222 lb 4 oz (100.812 kg)  SpO2 95%  Physical Exam  Constitutional: He is oriented to person, place, and time and well-developed, well-nourished, and in no distress. No distress.  HENT:  Head: Normocephalic and atraumatic.  Eyes: Conjunctivae are normal.  Cardiovascular: Normal rate.   Pulmonary/Chest: Effort normal.  Neurological: He is alert and oriented to person, place, and time.  Skin: Skin is warm and dry. He is not diaphoretic.  Psychiatric: Mood, memory, affect and judgment normal.  Nursing note and vitals reviewed.

## 2014-12-13 NOTE — Progress Notes (Signed)
Pre visit review using our clinic review tool, if applicable. No additional management support is needed unless otherwise documented below in the visit note. 

## 2014-12-13 NOTE — Assessment & Plan Note (Signed)
Followed by endo. Recent changes made- see note from 5/9- Dr. Elvera LennoxGherghe.

## 2014-12-31 ENCOUNTER — Telehealth: Payer: Self-pay | Admitting: Internal Medicine

## 2014-12-31 NOTE — Telephone Encounter (Signed)
Go up by 2 units with every meals: 15 units tid for the next 2 days, if not better, continue to increase by another 2 units, etc.

## 2014-12-31 NOTE — Telephone Encounter (Signed)
Called pt and advised him per Dr Charlean SanfilippoGherghe's message. Pt voiced understanding and will let us know how his sugars are next week.

## 2014-12-31 NOTE — Telephone Encounter (Signed)
Pt called in to let Dr. Elvera LennoxGherghe know his sugar levels are still running in the mid  200

## 2014-12-31 NOTE — Telephone Encounter (Signed)
Returned pt's call. Asked him when is his blood sugar running in the 200's. He stated that they are running in the 200's all day. He has already increased his insulin to 13 units 3 times a day. Please advise to any further increases in insulin.

## 2015-01-13 ENCOUNTER — Other Ambulatory Visit: Payer: Self-pay | Admitting: Internal Medicine

## 2015-03-07 ENCOUNTER — Telehealth: Payer: Self-pay | Admitting: Family Medicine

## 2015-03-07 ENCOUNTER — Ambulatory Visit: Payer: 59 | Admitting: Family Medicine

## 2015-03-07 DIAGNOSIS — R7989 Other specified abnormal findings of blood chemistry: Secondary | ICD-10-CM

## 2015-03-07 NOTE — Telephone Encounter (Signed)
Patient interested in getting IM testosterone.  No longer wants topical rx for replacement.  Referral placed to urology.

## 2015-03-10 ENCOUNTER — Ambulatory Visit (INDEPENDENT_AMBULATORY_CARE_PROVIDER_SITE_OTHER): Payer: 59 | Admitting: Internal Medicine

## 2015-03-10 ENCOUNTER — Other Ambulatory Visit: Payer: Self-pay | Admitting: *Deleted

## 2015-03-10 ENCOUNTER — Encounter: Payer: Self-pay | Admitting: Internal Medicine

## 2015-03-10 VITALS — BP 118/62 | HR 69 | Temp 98.9°F | Resp 12 | Wt 220.0 lb

## 2015-03-10 DIAGNOSIS — IMO0002 Reserved for concepts with insufficient information to code with codable children: Secondary | ICD-10-CM

## 2015-03-10 DIAGNOSIS — E1165 Type 2 diabetes mellitus with hyperglycemia: Secondary | ICD-10-CM

## 2015-03-10 DIAGNOSIS — E1121 Type 2 diabetes mellitus with diabetic nephropathy: Secondary | ICD-10-CM

## 2015-03-10 DIAGNOSIS — E1129 Type 2 diabetes mellitus with other diabetic kidney complication: Secondary | ICD-10-CM

## 2015-03-10 LAB — HEMOGLOBIN A1C: Hgb A1c MFr Bld: 9 % — ABNORMAL HIGH (ref 4.6–6.5)

## 2015-03-10 MED ORDER — INSULIN REGULAR HUMAN (CONC) 500 UNIT/ML ~~LOC~~ SOLN: SUBCUTANEOUS | Status: DC

## 2015-03-10 NOTE — Progress Notes (Signed)
Patient ID: Martin Downs, male   DOB: 1966-01-28, 49 y.o.   MRN: [id number]  HPI: Martin Downs is a 49 y.o.-year-old male, returning for f/u for DM2 dx 2014, insulin-dependent since 02/2014, uncontrolled, with complications (mild CKD). Last visit 3 mo ago.  Last hemoglobin A1c was: Lab Results  Component Value Date   HGBA1C 9.7* 10/19/2014   HGBA1C 11.2* 05/31/2014   HGBA1C 12.5* 02/11/2014   Pt was on a regimen of: - Metformin 1000 mg bid - Levemir 40 units 2x a day  - NovoLog 15 min before a meal:  - 30 units before a smaller meal - 35 units before a larger meal    Novolog 15 units for a snack. We stopped Actoplus 15-850 mg in 02/2014. We stopped Glipizide when we increased Novolog.  He takes: U500 insulin - 30 min before a meal  - 0.18-0.20 mL 2-3x a day - he does not take the insulin if he is <250 (even if he tries to use 12 units) as he drops his sugars to 100s - Metformin 1000 mg bid  Pt checks sugars 3x a day - all hugh: - am: 203-377 >> 120, 236-373 >> 170-356 (not fasting) >> 208-380 >> 196-222 >> 69, 100, 204-340 - 2h after b'fast: n/c >> 284-365, 441, 481 >> 210-419 >> 166-395 >> 216-244 >> 166-300, 380 - lunch: n/c >> 198-400 >> 200-305 >> 175-298 >> 204-237 >> 125, 159-300 - 2h after lunch: 399 >> 241-338, 386 >> 280-421 >> 149-199, 390 >> 228-261 >> 205-307 - dinner: 242-345 >> 165-294 >> 219--241 >> 147-220 >> 218-240 >> 94, 164-328  - 2h after dinner: n/c >> 175-307 >> 237-349 >> 155-314 >> 228-260 >> 241-379 - bedtime: 217-425 >> 163, 220-377 >> 208-340 >> 166-270 >> 207-225 >> 211-281 - nighttime: 88 (sweating), 187-443 >> 394 >> 190-240 >> n/c  Pt's meals are - grazes  - Breakfast: oatmeal or yoghurt or fruit bar/cup (largest meal) - Lunch: PB sandwich - Dinner: chicken leftovers - Snacks: sandwich, 1 pack of crackers, diet Mountain dew  - Mild CKD, last BUN/creatinine:  Lab Results  Component Value Date   BUN 14 11/01/2014   CREATININE 0.71  11/01/2014  Not on an ACEI. - last set of lipids: Lab Results  Component Value Date   CHOL 213* 11/01/2014   HDL 37.30* 11/01/2014   LDLCALC 73 02/11/2014   LDLDIRECT 136.0 11/01/2014   TRIG 330.0* 11/01/2014   CHOLHDL 6 11/01/2014  On Crestor and Fenofibrate. - last eye exam was on 12/09/2014. No DR. Has one scheduled for next week. - no numbness and tingling in his feet.  I reviewed pt's medications, allergies, PMH, social hx, family hx, and changes were documented in the history of present illness. Otherwise, unchanged from my initial visit note..  ROS: Constitutional: no weight gain/loss, no fatigue, no subjective hyperthermia/hypothermia Eyes: no blurry vision, no xerophthalmia ENT: no sore throat, no nodules palpated in throat, no dysphagia/odynophagia, no hoarseness Cardiovascular: no CP/SOB/palpitations/leg swelling Respiratory: no cough/SOB Gastrointestinal: no N/V/D/C Musculoskeletal: no muscle/joint aches Skin: no rashes Neurological: no tremors/numbness/tingling/dizziness  PE: BP 118/62 mmHg  Pulse 69  Temp(Src) 98.9 F (37.2 C) (Oral)  Resp 12  Wt 220 lb (99.791 kg)  SpO2 97% Body mass index is 32.47 kg/(m^2).  Wt Readings from Last 3 Encounters:  03/10/15 220 lb (99.791 kg)  12/13/14 222 lb 4 oz (100.812 kg)  12/06/14 216 lb (97.977 kg)  Constitutional: overweight, in NAD Eyes: PERRLA, EOMI, no exophthalmos  ENT: moist mucous membranes, no thyromegaly, no cervical lymphadenopathy Cardiovascular: RRR, No MRG Respiratory: CTA B Gastrointestinal: abdomen soft, NT, ND, BS+ Musculoskeletal: no deformities, strength intact in all 4 Skin: moist, warm, no rashes Neurological: no tremor with outstretched hands, DTR normal in all 4  ASSESSMENT: 1. DM2, insulin-dependent, uncontrolled, with complications - mild ckd  PLAN:  1. Patient with h/o uncontrolled diabetes, with increased insulin resistance. Sugars improved on the U500 insulin, but he is not taking  insulin if sugars <250 as he drops in the 100s too fast. I will decrease his insulin doses so he can take it consistently. We discussed about the U500 pen, but he still has vials at home - we can switch at next visit or when he runs out. We also discussed about a VGO system but he works outside and sweats a lot >> maybe we can try this starting this fall. - I suggested to:  Patient Instructions  Please continue: - Metformin 1000 mg 2x a day with meals  Change the U500 insulin: - 18 units before breakfast - 8 units before lunch - 10-12 units before dinner   Try injecting insulin in the upper thigh.  Check with your insurance if they cover a VGo system.  Please stop at the lab.  - continue checking sugars at different times of the day - check 3 times a day, rotating checks - advised for yearly eye exams - he is UTD - check HbA1c today - Return to clinic in 3 mo with sugar log.   Office Visit on 03/10/2015  Component Date Value Ref Range Status  . Hgb A1c MFr Bld 03/10/2015 9.0* 4.6 - 6.5 % Final   Glycemic Control Guidelines for People with Diabetes:Non Diabetic:  <6%Goal of Therapy: <7%Additional Action Suggested:  >8%    HbA1c decreasing!

## 2015-03-10 NOTE — Patient Instructions (Addendum)
Please continue: - Metformin 1000 mg 2x a day with meals  Change the U500 insulin: - 18 units before breakfast - 8 units before lunch - 10-12 units before dinner   Try injecting insulin in the upper thigh.  Check with your insurance if they cover a VGo system.  Please stop at the lab.

## 2015-03-18 ENCOUNTER — Other Ambulatory Visit: Payer: Self-pay | Admitting: Family Medicine

## 2015-04-21 ENCOUNTER — Other Ambulatory Visit: Payer: Self-pay | Admitting: Internal Medicine

## 2015-05-09 ENCOUNTER — Telehealth: Payer: Self-pay | Admitting: Internal Medicine

## 2015-05-09 NOTE — Telephone Encounter (Signed)
Please read message below and advise.  

## 2015-05-09 NOTE — Telephone Encounter (Signed)
Pt states was at another MD office and they told him the Prolactin level was high and this needs to be treated before he can get the testosterone shot

## 2015-05-10 ENCOUNTER — Other Ambulatory Visit: Payer: Self-pay | Admitting: Internal Medicine

## 2015-05-10 NOTE — Telephone Encounter (Signed)
I have not been treating him for hypogonadism. Hyperprolactinemia would be a new problem, and I will need to see him to discuss and start an investigation for this. If he wants to wait until his next appointment in November, I will need 30 minutes for that appointment.

## 2015-05-11 NOTE — Telephone Encounter (Signed)
Called pt and he scheduled appt to be seen.

## 2015-05-12 ENCOUNTER — Ambulatory Visit: Payer: 59 | Admitting: Internal Medicine

## 2015-05-13 ENCOUNTER — Encounter: Payer: Self-pay | Admitting: Internal Medicine

## 2015-05-13 ENCOUNTER — Ambulatory Visit (INDEPENDENT_AMBULATORY_CARE_PROVIDER_SITE_OTHER): Payer: 59 | Admitting: Internal Medicine

## 2015-05-13 VITALS — BP 118/78 | HR 76 | Temp 98.5°F | Resp 12 | Wt 221.0 lb

## 2015-05-13 DIAGNOSIS — E1121 Type 2 diabetes mellitus with diabetic nephropathy: Secondary | ICD-10-CM | POA: Diagnosis not present

## 2015-05-13 DIAGNOSIS — E1165 Type 2 diabetes mellitus with hyperglycemia: Secondary | ICD-10-CM | POA: Diagnosis not present

## 2015-05-13 DIAGNOSIS — E230 Hypopituitarism: Secondary | ICD-10-CM

## 2015-05-13 DIAGNOSIS — E221 Hyperprolactinemia: Secondary | ICD-10-CM | POA: Diagnosis not present

## 2015-05-13 DIAGNOSIS — IMO0002 Reserved for concepts with insufficient information to code with codable children: Secondary | ICD-10-CM

## 2015-05-13 NOTE — Progress Notes (Signed)
Patient ID: Martin Downs, male   DOB: 04/27/1966, 49 y.o.   MRN: [id number]  HPI: Martin Downs is a 49 y.o.-year-old male, returning for f/u for DM2 dx 2014, insulin-dependent since 02/2014, uncontrolled, with complications (mild CKD). Also, since last visit, he has been found to have hyperprolactinemia. Last visit 2 mo ago.  DM2: Last hemoglobin A1c was: Lab Results  Component Value Date   HGBA1C 9.0* 03/10/2015   HGBA1C 9.7* 10/19/2014   HGBA1C 11.2* 05/31/2014   Pt was on a regimen of: - Metformin 1000 mg bid - Levemir 40 units 2x a day  - NovoLog 15 min before a meal:  - 30 units before a smaller meal - 35 units before a larger meal    Novolog 15 units for a snack. We stopped Actoplus 15-850 mg in 02/2014. We stopped Glipizide when we increased Novolog. Insurance does not cover Invokana.  He takes: U500 insulin - 30 min before a meal. Patient is not using the doses that I recommended because he mentions that he forgets them... He is actually taking (in bold): - 18 units before breakfast >> 10 units - 8 units before lunch >> 10 units - 10-12 units before dinner >> 10 units - Metformin 1000 mg bid  Pt checks sugars 3x a day - all high, but no log or meter. Reviewed the CBG from last visit - patient mentions that his sugars are now approximately the same.  - am: 203-377 >> 120, 236-373 >> 170-356 (not fasting) >> 208-380 >> 196-222 >> 69, 100, 204-340  - 2h after b'fast: n/c >> 284-365, 441, 481 >> 210-419 >> 166-395 >> 216-244 >> 166-300, 380 - lunch: n/c >> 198-400 >> 200-305 >> 175-298 >> 204-237 >> 125, 159-300 - 2h after lunch: 399 >> 241-338, 386 >> 280-421 >> 149-199, 390 >> 228-261 >> 205-307 - dinner: 242-345 >> 165-294 >> 219--241 >> 147-220 >> 218-240 >> 94, 164-328  - 2h after dinner: n/c >> 175-307 >> 237-349 >> 155-314 >> 228-260 >> 241-379 - bedtime: 217-425 >> 163, 220-377 >> 208-340 >> 166-270 >> 207-225 >> 211-281 - nighttime: 88 (sweating), 187-443  >> 394 >> 190-240 >> n/c  Pt's meals are - grazes  - Breakfast: oatmeal or yoghurt or fruit bar/cup (largest meal) - Lunch: PB sandwich - Dinner: chicken leftovers - Snacks: sandwich, 1 pack of crackers, diet Mountain dew  - Mild CKD, last BUN/creatinine:  Lab Results  Component Value Date   BUN 14 11/01/2014   CREATININE 0.71 11/01/2014  Not on an ACEI. - last set of lipids: Lab Results  Component Value Date   CHOL 213* 11/01/2014   HDL 37.30* 11/01/2014   LDLCALC 73 02/11/2014   LDLDIRECT 136.0 11/01/2014   TRIG 330.0* 11/01/2014   CHOLHDL 6 11/01/2014  On Crestor and Fenofibrate. - last eye exam was on 12/09/2014. No DR. Has one scheduled for next week. - no numbness and tingling in his feet.  Hyperprolactinemia: - Recently found during investigation for low testosterone, by Dr. Mena GoesEskridge, his urologist. Patient was referred to me for further investigation for this problem.  - Reviewed labs (drawn at 8:33 AM) along with the patient:  Prolactin 38.6 (2.1-17.1)  FSH 5.6, LH 2.0  Total testosterone 134 (300-890), bioavailable testosterone 35.5 ng/dL (782.9-562.1130.5-681.7), free testosterone 18 pg/mL (47-244), SHBG 29 (10-50)  PSA 0.16  Estradiol 17.1 (0-39)  Pt does c/o fatigue, low libido and problems with erections, but no gynecomastia or galactorrhea. Also, no headaches, no double vision.  I reviewed pt's medications, allergies, PMH, social hx, family hx, and changes were documented in the history of present illness. Otherwise, unchanged from my initial visit note..  ROS: Constitutional: no weight gain/loss, no fatigue, no subjective hyperthermia/hypothermia Eyes: no blurry vision, no xerophthalmia ENT: no sore throat, no nodules palpated in throat, no dysphagia/odynophagia, no hoarseness Cardiovascular: no CP/SOB/palpitations/leg swelling Respiratory: no cough/SOB Gastrointestinal: no N/V/D/C Musculoskeletal: no muscle/joint aches Skin: no rashes Neurological: no  tremors/numbness/tingling/dizziness + See history of present illness  PE: BP 118/78 mmHg  Pulse 76  Temp(Src) 98.5 F (36.9 C) (Oral)  Resp 12  Wt 221 lb (100.245 kg)  SpO2 97% Body mass index is 32.62 kg/(m^2).  Wt Readings from Last 3 Encounters:  05/13/15 221 lb (100.245 kg)  03/10/15 220 lb (99.791 kg)  12/13/14 222 lb 4 oz (100.812 kg)  Constitutional: overweight, in NAD Eyes: PERRLA, EOMI, no exophthalmos ENT: moist mucous membranes, no thyromegaly, no cervical lymphadenopathy Cardiovascular: RRR, No MRG Respiratory: CTA B Gastrointestinal: abdomen soft, NT, ND, BS+ Musculoskeletal: no deformities, strength intact in all 4 Skin: moist, warm, no rashes Neurological: no tremor with outstretched hands, DTR normal in all 4  ASSESSMENT: 1. DM2, insulin-dependent, uncontrolled, with complications - mild ckd  2. Hyperprolactinemia  3. Hypogonadotropic hypogonadism  PLAN:  1. Patient with h/o uncontrolled diabetes, with increased insulin resistance. Sugars improved on the U500 insulin, but he is still not taking insulin if sugars <250 as he drops in the 100s too fast, despite decreasing his insulin doses at last visit. Will decrease them more and add Levemir in am. This is an unusual regimen, but we do not have a lot of options... - We again discussed about a VGO system or another type of pump >> but he works outside and sweats a lot >> he does not think he can wear this - I suggested to:  Patient Instructions  Please decrease U500 insulin: - 5 units before a smaller meal - 7 units before a larger meal  Please add Levemir 40 units in am.  Please come back fasting at 8 am for the rest of the labs.   - continue checking sugars at different times of the day - check 3 times a day, rotating checks - advised for yearly eye exams - he is UTD - Return to clinic in 1.5 mo with sugar log.   2. And 3. Patient with a newly found high prolactin levels and a new diagnosis of  hypogonadotropic hypogonadism - I discussed with the patient about possible etiologies of high prolactin levels:  Hypothyroidism (no history of this, but will recheck)  Stress (not more than usual)  Exercise (no)  Lack of sleep (no)  Medications (he is not taking psychotropic medications or Reglan)  Drugs (denies)  Chest wall lesions (denies)  Seizures (denies)  Liver ds (not present, per review of recent LFTs)  Kidney disease (not present, per review of recent BUN/creatinine)  Macroprolactin (multimeric prolactin)  idiopathic - high prolactin most frequently impacts LH and FSH >> low testosterone - we will check monomeric and total prolactin today, along with: Orders Placed This Encounter  Procedures  . Testosterone, Free, Total, SHBG  . Luteinizing hormone  . Insulin-like growth factor  . Follicle stimulating hormone  . T4, free  . T3, free  . TSH  . Cortisol  . ACTH  . Prolactin, Total and Monomeric  - We will also need a dedicated pituitary MRI to investigate his Hypogonadotropic hypogonadism and high prolactin -  will order when the labs are back - we discussed the possibility of tx of her high PRL levels (Cabergoline, low dose) if the high PRL level is not caused by macroprolactin. He agrees with the plan. - RTC in 1.5 months  - time spent with the patient: 40 minutes, of which >50% was spent in obtaining information about his symptoms, reviewing his previous labs, evaluations, and treatments, counseling him about his diabetes and also his new diagnosis of hyperprolactinemia and hypogonadotropic hypogonadism (please see the discussed topics above), and developing a plan to further investigate them; he had a number of questions which I addressed.  Component     Latest Ref Rng 05/16/2015  Testosterone     300 - 890 ng/dL 098 (L)  Sex Hormone Binding     10 - 50 nmol/L 28  Testosterone Free     47.0 - 244.0 pg/mL 26.9 (L)  Testosterone-% Free     1.6 - 2.9 %  2.0  IGF-I, LC/MS     50 - 317 ng/mL 99  Z-Score (Male)     -2.0-+2.0 SD -0.7  Prolactin, Total     2.0 - 18.0 ng/mL 35.4 (H)  Prolactin, Monomeric     3.4 - 14.8 ng/mL 11.4  TSH     0.35 - 4.50 uIU/mL 1.52  LH     1.50 - 9.30 mIU/mL 2.03  FSH     1.4 - 18.1 mIU/ML 5.9  Free T4     0.60 - 1.60 ng/dL 1.19  T3, Free     2.3 - 4.2 pg/mL 4.2  Cortisol, Plasma      14.2  C206 ACTH     6 - 50 pg/mL 32   Total and free testosterone are still low.  Interestingly, the total prolactin is high, while the monomeric prolactin is low, which is great news: Rather than having elevated prolactin production from the pituitary, he has "sticky" prolactin molecules, that clump together and confound the results. This condition is called macroprolactin. No treatment is needed.  The rest of the pituitary hormones are normal. I would suggest that we check a pituitary MRI due to the low LH, FSH, and testosterone. After we get the results, we can think about low testosterone treatment.  I will addend the results when they become available.

## 2015-05-13 NOTE — Patient Instructions (Signed)
Please decrease U500 insulin: - 5 units before a smaller meal - 7 units before a larger meal  Please add Levemir 40 units in am.  Please come back fasting at 8 am for the rest of the labs.

## 2015-05-16 ENCOUNTER — Other Ambulatory Visit: Payer: Self-pay | Admitting: *Deleted

## 2015-05-16 ENCOUNTER — Other Ambulatory Visit (INDEPENDENT_AMBULATORY_CARE_PROVIDER_SITE_OTHER): Payer: 59

## 2015-05-16 DIAGNOSIS — E230 Hypopituitarism: Secondary | ICD-10-CM | POA: Diagnosis not present

## 2015-05-16 DIAGNOSIS — E221 Hyperprolactinemia: Secondary | ICD-10-CM | POA: Diagnosis not present

## 2015-05-16 LAB — CORTISOL: CORTISOL PLASMA: 14.2 ug/dL

## 2015-05-16 LAB — T3, FREE: T3 FREE: 4.2 pg/mL (ref 2.3–4.2)

## 2015-05-16 LAB — T4, FREE: Free T4: 0.86 ng/dL (ref 0.60–1.60)

## 2015-05-16 LAB — TSH: TSH: 1.52 u[IU]/mL (ref 0.35–4.50)

## 2015-05-16 LAB — LUTEINIZING HORMONE: LH: 2.03 m[IU]/mL (ref 1.50–9.30)

## 2015-05-16 LAB — FOLLICLE STIMULATING HORMONE: FSH: 5.9 m[IU]/mL (ref 1.4–18.1)

## 2015-05-16 MED ORDER — INSULIN DETEMIR 100 UNIT/ML FLEXPEN: 40.0000 [IU] | PEN_INJECTOR | Freq: Every morning | SUBCUTANEOUS | Status: DC

## 2015-05-16 MED ORDER — INSULIN PEN NEEDLE 32G X 4 MM MISC: Status: DC

## 2015-05-17 LAB — TESTOSTERONE, FREE, TOTAL, SHBG
SEX HORMONE BINDING: 28 nmol/L (ref 10–50)
TESTOSTERONE FREE: 26.9 pg/mL — AB (ref 47.0–244.0)
TESTOSTERONE-% FREE: 2 % (ref 1.6–2.9)
Testosterone: 132 ng/dL — ABNORMAL LOW (ref 300–890)

## 2015-05-18 LAB — ACTH: C206 ACTH: 32 pg/mL (ref 6–50)

## 2015-05-20 LAB — INSULIN-LIKE GROWTH FACTOR
IGF-I, LC/MS: 99 ng/mL (ref 50–317)
Z-SCORE (MALE): -0.7 {STDV} (ref ?–2.0)

## 2015-05-20 LAB — PROLACTIN, TOTAL AND MONOMERIC
Prolactin, Monomeric: 11.4 ng/mL (ref 3.4–14.8)
Prolactin, Total: 35.4 ng/mL — ABNORMAL HIGH (ref 2.0–18.0)

## 2015-05-24 ENCOUNTER — Other Ambulatory Visit: Payer: Self-pay | Admitting: Internal Medicine

## 2015-05-31 ENCOUNTER — Telehealth: Payer: Self-pay | Admitting: Internal Medicine

## 2015-05-31 NOTE — Telephone Encounter (Signed)
Pt needs a call back at your convenience he did not want to say why

## 2015-06-01 ENCOUNTER — Telehealth: Payer: Self-pay | Admitting: *Deleted

## 2015-06-01 NOTE — Telephone Encounter (Signed)
Pt called stating that Dr Elvera LennoxGherghe on 10/21 and he has not heard from anyone on an appt for this. Can you please look into this? Thank you!

## 2015-06-01 NOTE — Telephone Encounter (Signed)
Called pt and he was checking to see if his MRI has been scheduled. Ad[facility] pt [facility]. Pt voiced understanding.

## 2015-06-13 ENCOUNTER — Other Ambulatory Visit: Payer: Self-pay | Admitting: Internal Medicine

## 2015-06-13 DIAGNOSIS — E230 Hypopituitarism: Secondary | ICD-10-CM

## 2015-06-14 ENCOUNTER — Ambulatory Visit: Payer: 59 | Admitting: Internal Medicine

## 2015-06-26 ENCOUNTER — Other Ambulatory Visit: Payer: Self-pay | Admitting: Family Medicine

## 2015-07-01 ENCOUNTER — Other Ambulatory Visit: Payer: Self-pay | Admitting: Internal Medicine

## 2015-07-01 ENCOUNTER — Ambulatory Visit
Admission: RE | Admit: 2015-07-01 | Discharge: 2015-07-01 | Disposition: A | Discharge status: Home / Self Care | Payer: 59 | Source: Ambulatory Visit | Attending: Internal Medicine | Admitting: Internal Medicine

## 2015-07-01 ENCOUNTER — Telehealth: Payer: Self-pay | Admitting: Internal Medicine

## 2015-07-01 DIAGNOSIS — E230 Hypopituitarism: Secondary | ICD-10-CM

## 2015-07-01 MED ORDER — GADOBENATE DIMEGLUMINE 529 MG/ML IV SOLN: 10.0000 mL | Freq: Once | INTRAVENOUS | Status: DC | PRN

## 2015-07-01 MED ORDER — LORAZEPAM 0.5 MG PO TABS: 1.0000 mg | ORAL_TABLET | Freq: Once | ORAL | Status: DC

## 2015-07-01 NOTE — Telephone Encounter (Signed)
Called pt and advised him. He ok'd this. Rx signed and faxed to pt's pharmacy.

## 2015-07-01 NOTE — Telephone Encounter (Signed)
Returned pt's call. He went to have the MRI and did not do well. He stated he kinda wigged out. He will need something for the anxiety to do it again. Be advised will probably have to reschedule.

## 2015-07-01 NOTE — Telephone Encounter (Signed)
Patient would like for Carollee HerterShannon to please call him as soon as she can    Thank you

## 2015-07-01 NOTE — Telephone Encounter (Signed)
OK. We can call in 4 x Ativan tablets 0.5 mg. He can take 2 before his MRI.

## 2015-07-11 ENCOUNTER — Telehealth: Payer: Self-pay | Admitting: Internal Medicine

## 2015-07-11 NOTE — Telephone Encounter (Signed)
Returned pt's call. He said that the pharmacist advised that ativan does not mix well with methadone. Please change med. Thank you.

## 2015-07-11 NOTE — Telephone Encounter (Signed)
Called pt advised him per Dr Elvera LennoxGherghe. He said that they told him at the methadone clinic that he could take a sleeping pill to get him through the test. Please advise.

## 2015-07-11 NOTE — Telephone Encounter (Signed)
Pt needs meds changed please, there could be a drug interaction with the new med he is now on

## 2015-07-11 NOTE — Telephone Encounter (Signed)
I am not sure what else to give him - but the Ativan would have been taken just once, before the MRI. Could he skip methadone that day? If not, maybe Dr Dayton MartesAron can help about what short term anxiety med we can use.

## 2015-07-12 MED ORDER — ZOLPIDEM TARTRATE 5 MG PO TABS: 5.0000 mg | ORAL_TABLET | Freq: Once | ORAL | Status: DC

## 2015-07-12 NOTE — Telephone Encounter (Signed)
Called pt and advised him. Pt voiced understanding.

## 2015-07-12 NOTE — Telephone Encounter (Signed)
Pt left v/m requesting med to take prior to MRI; spoke with pt and he said Dr Elvera LennoxGherghe had already taken care of med order. Nothing further needed.

## 2015-07-12 NOTE — Telephone Encounter (Signed)
Rx for Ambien 5 mg (2 tablets) per Dr Elvera LennoxGherghe complete.

## 2015-07-12 NOTE — Telephone Encounter (Signed)
Let's send 2 tablets of Ambien 5 mg. Please have somebody drive him to and from the procedure.

## 2015-07-12 NOTE — Addendum Note (Signed)
Addended by: Adline MangoALLICUTT, Braylon Lemmons B on: 07/12/2015 09:40 AM   Modules accepted: Orders

## 2015-07-14 ENCOUNTER — Ambulatory Visit
Admission: RE | Admit: 2015-07-14 | Discharge: 2015-07-14 | Disposition: A | Discharge status: Home / Self Care | Payer: 59 | Source: Ambulatory Visit | Attending: Internal Medicine | Admitting: Internal Medicine

## 2015-07-14 MED ORDER — GADOBENATE DIMEGLUMINE 529 MG/ML IV SOLN
10.0000 mL | Freq: Once | INTRAVENOUS | Status: AC | PRN
  Administered 2015-07-14: 10 mL via INTRAVENOUS

## 2015-07-15 ENCOUNTER — Other Ambulatory Visit (INDEPENDENT_AMBULATORY_CARE_PROVIDER_SITE_OTHER): Payer: 59 | Admitting: *Deleted

## 2015-07-15 ENCOUNTER — Ambulatory Visit (INDEPENDENT_AMBULATORY_CARE_PROVIDER_SITE_OTHER): Payer: 59 | Admitting: Internal Medicine

## 2015-07-15 ENCOUNTER — Encounter: Payer: Self-pay | Admitting: Internal Medicine

## 2015-07-15 VITALS — BP 118/70 | HR 77 | Temp 98.2°F | Resp 12 | Wt 225.0 lb

## 2015-07-15 DIAGNOSIS — E1121 Type 2 diabetes mellitus with diabetic nephropathy: Secondary | ICD-10-CM

## 2015-07-15 DIAGNOSIS — E1165 Type 2 diabetes mellitus with hyperglycemia: Secondary | ICD-10-CM

## 2015-07-15 DIAGNOSIS — E230 Hypopituitarism: Secondary | ICD-10-CM

## 2015-07-15 DIAGNOSIS — IMO0002 Reserved for concepts with insufficient information to code with codable children: Secondary | ICD-10-CM

## 2015-07-15 LAB — POCT GLYCOSYLATED HEMOGLOBIN (HGB A1C): HEMOGLOBIN A1C: 9.3

## 2015-07-15 MED ORDER — INSULIN REGULAR HUMAN (CONC) 500 UNIT/ML ~~LOC~~ SOLN: SUBCUTANEOUS | Status: DC

## 2015-07-15 MED ORDER — ALBIGLUTIDE 50 MG ~~LOC~~ PEN: PEN_INJECTOR | SUBCUTANEOUS | Status: DC

## 2015-07-15 NOTE — Patient Instructions (Signed)
Please come back at 8 am, fasting, for a new testosterone level.  Please continue U500 insulin 10-12 units before every meal.  Try to add Tanzeum 50 mg once a week.  Please return in 3 months with your sugar log.

## 2015-07-15 NOTE — Progress Notes (Signed)
Patient ID: Martin Downs, male   DOB: 1965-11-05, 49 y.o.   MRN: [id number]  HPI: Martin Downs is a 49 y.o.-year-old male, returning for f/u for DM2 dx 2014, insulin-dependent since 02/2014, uncontrolled, with complications (mild CKD). Also,  Hypogonadotropic hypogonadism. Last visit 2 mo ago.  DM2: Last hemoglobin A1c was: Lab Results  Component Value Date   HGBA1C 9.0* 03/10/2015   HGBA1C 9.7* 10/19/2014   HGBA1C 11.2* 05/31/2014   Pt was on a regimen of: - Metformin 1000 mg bid - Levemir 40 units 2x a day  - NovoLog 15 min before a meal:  - 30 units before a smaller meal - 35 units before a larger meal    Novolog 15 units for a snack. We stopped Actoplus 15-850 mg in 02/2014. We stopped Glipizide when we increased Novolog. Insurance does not cover Invokana.  He takes: - U500 insulin: - 18 units before breakfast >> 10 >> 5-7 >> 10-12 units - 8 units before lunch >> 10 >> 5-7 >> 10-12 units - 10-12 units before dinner >> 10 >> 5-7 >> 10-12 units - Metformin 1000 mg bid Stopped Levemir 40 units in am, b/c increased sugars on it >> stopped 3 weeks ago as sugars 400-500s.  Pt checks sugars 3x a day - better sugars after stopping Levemir: - am: 203-377 >> 120, 236-373 >> 170-356 (not fasting) >> 208-380 >> 196-222 >> 69, 100, 204-340 >> 100, 104-304 - 2h after b'fast: n/c >> 284-365, 441, 481 >> 210-419 >> 166-395 >> 216-244 >> 166-300, 380 >> n/c - lunch: n/c >> 198-400 >> 200-305 >> 175-298 >> 204-237 >> 125, 159-300 >> 119, 281-320 - 2h after lunch: 399 >> 241-338, 386 >> 280-421 >> 149-199, 390 >> 228-261 >> 205-307 >> 210-252, 300 - dinner: 242-345 >> 165-294 >> 219--241 >> 147-220 >> 218-240 >> 94, 164-328 >> 122-242, 288 - 2h after dinner: n/c >> 175-307 >> 237-349 >> 155-314 >> 228-260 >> 241-379 >> n/c - bedtime: 217-425 >> 163, 220-377 >> 208-340 >> 166-270 >> 207-225 >> 211-281 >> 185-254 - nighttime: 88 (sweating), 187-443 >> 394 >> 190-240 >> n/c >>  180-203, 354  Pt's meals are - grazes! - Breakfast: oatmeal or yoghurt or fruit bar/cup (largest meal) - Lunch: PB sandwich - Dinner: chicken leftovers - Snacks: sandwich, 1 pack of crackers, diet Mountain dew  - Mild CKD, last BUN/creatinine:  Lab Results  Component Value Date   BUN 14 11/01/2014   CREATININE 0.71 11/01/2014  Not on an ACEI. - last set of lipids: Lab Results  Component Value Date   CHOL 213* 11/01/2014   HDL 37.30* 11/01/2014   LDLCALC 73 02/11/2014   LDLDIRECT 136.0 11/01/2014   TRIG 330.0* 11/01/2014   CHOLHDL 6 11/01/2014  On Crestor and Fenofibrate. - last eye exam was on 12/09/2014. No DR. Has one scheduled for next week. - no numbness and tingling in his feet.  Hypogonadotropic hypogonadism: -  Diagnosed 2 years ago - He was on Androgel 2 years ago >> level did not increase and he did not feel better -  We checked his pituitary function at last visit >>  Labs normal except inappropriately normal LH, FSH , and low  Testosterone.  Monomeric prolactin is normal.   Component     Latest Ref Rng 05/16/2015  Testosterone     300 - 890 ng/dL 629 (L)  Sex Hormone Binding     10 - 50 nmol/L 28  Testosterone Free  47.0 - 244.0 pg/mL 26.9 (L)  Testosterone-% Free     1.6 - 2.9 % 2.0  IGF-I, LC/MS     50 - 317 ng/mL 99  Z-Score (Male)     -2.0-+2.0 SD -0.7  Prolactin, Total     2.0 - 18.0 ng/mL 35.4 (H)  Prolactin, Monomeric     3.4 - 14.8 ng/mL 11.4  LH     1.50 - 9.30 mIU/mL 2.03  FSH     1.4 - 18.1 mIU/ML 5.9  Cortisol, Plasma      14.2  C206 ACTH     6 - 50 pg/mL 32   Previous labs (drawn at 8:33 AM)   Prolactin 38.6 (2.1-17.1)  FSH 5.6, LH 2.0  Total testosterone 134 (300-890), bioavailable testosterone 35.5 ng/dL (161.0-960.4130.5-681.7), free testosterone 18 pg/mL (47-244), SHBG 29 (10-50)  PSA 0.16  Estradiol 17.1 (0-39)  He had a pituitary MRI yesterday (07/14/2015) >>  No pituitary lesions.  Hyperprolactinemia: - found during  investigation for low testosterone, by Dr. Mena GoesEskridge, his urologist. -  Turned out to be caused by macroprolactin, which does not require further investigation  Pt does c/o fatigue, low libido and problems with erections, but no gynecomastia or galactorrhea.   I reviewed pt's medications, allergies, PMH, social hx, family hx, and changes were documented in the history of present illness. Otherwise, unchanged from my initial visit note.  ROS: Constitutional: no weight gain/loss,+  fatigue, no subjective hyperthermia/hypothermia Eyes: no blurry vision, no xerophthalmia ENT: no sore throat, no nodules palpated in throat, no dysphagia/odynophagia, no hoarseness Cardiovascular: no CP/SOB/palpitations/leg swelling Respiratory: no cough/SOB Gastrointestinal: no N/V/D/C Musculoskeletal: no muscle/joint aches Skin: no rashes Neurological: no tremors/numbness/tingling/dizziness  PE: BP 118/70 mmHg  Pulse 77  Temp(Src) 98.2 F (36.8 C) (Oral)  Resp 12  Wt 225 lb (102.059 kg)  SpO2 97% Body mass index is 33.21 kg/(m^2).  Wt Readings from Last 3 Encounters:  07/15/15 225 lb (102.059 kg)  05/13/15 221 lb (100.245 kg)  03/10/15 220 lb (99.791 kg)  Constitutional: overweight, in NAD Eyes: PERRLA, EOMI, no exophthalmos ENT: moist mucous membranes, no thyromegaly, no cervical lymphadenopathy Cardiovascular: RRR, No MRG Respiratory: CTA B Gastrointestinal: abdomen soft, NT, ND, BS+ Musculoskeletal: no deformities, strength intact in all 4 Skin: moist, warm, no rashes Neurological: no tremor with outstretched hands, DTR normal in all 4  ASSESSMENT: 1. DM2, insulin-dependent, uncontrolled, with complications - mild ckd  2. Hyperprolactinemia  3. Hypogonadotropic hypogonadism  PLAN:  1. Patient with h/o uncontrolled diabetes, with increased insulin resistance. Sugars improved on the U500 insulin, but cannot take a higher insulin dose as he feels his sugars are dropping too fast after this.  He is on an unusual regimen, but we do not have a lot of options.Marland Kitchen.Marland Kitchen.Will try to add Tanzeum. - We again discussed about a VGO system or another type of pump >> but he works outside and sweats a lot >> he does not think he can wear this - I suggested to:  Patient Instructions  Please come back at 8 am, fasting, for a new testosterone level.  Please continue U500 insulin 10-12 units before every meal.  Try to add Tanzeum 50 mg once a week.  Please return in 3 months with your sugar log.   - continue checking sugars at different times of the day - check 3 times a day, rotating checks - advised for yearly eye exams - he is UTD - check HbA1c today >> 9.3% (higher) - Return to clinic  in 3 mo with sugar log.   2. Hypogonadotropic Hypogonadism - Pituitary MRI normal  - reviewed rest of the pituitary tests >> normal - will repeat a testosterone level and start Testosterone inj weekly after this (50 mg)

## 2015-07-18 ENCOUNTER — Other Ambulatory Visit: Payer: 59

## 2015-07-18 DIAGNOSIS — R7989 Other specified abnormal findings of blood chemistry: Secondary | ICD-10-CM

## 2015-07-19 ENCOUNTER — Telehealth: Payer: Self-pay | Admitting: *Deleted

## 2015-07-19 ENCOUNTER — Other Ambulatory Visit: Payer: Self-pay | Admitting: Internal Medicine

## 2015-07-19 ENCOUNTER — Other Ambulatory Visit: Payer: Self-pay | Admitting: *Deleted

## 2015-07-19 ENCOUNTER — Telehealth: Payer: Self-pay | Admitting: Internal Medicine

## 2015-07-19 DIAGNOSIS — E230 Hypopituitarism: Secondary | ICD-10-CM

## 2015-07-19 LAB — TESTOSTERONE, FREE, TOTAL, SHBG
Sex Hormone Binding: 34 nmol/L (ref 10–50)
TESTOSTERONE-% FREE: 1.8 % (ref 1.6–2.9)
TESTOSTERONE: 131 ng/dL — AB (ref 300–890)
Testosterone, Free: 23.9 pg/mL — ABNORMAL LOW (ref 47.0–244.0)

## 2015-07-19 MED ORDER — "SYRINGE/NEEDLE (DISP) 18G X 1-1/2"" 3 ML MISC": Status: DC

## 2015-07-19 MED ORDER — TESTOSTERONE CYPIONATE 200 MG/ML IM SOLN: 50.0000 mg | INTRAMUSCULAR | Status: DC

## 2015-07-19 MED ORDER — "SYRINGE/NEEDLE (DISP) 23G X 1"" 1 ML MISC": Status: DC

## 2015-07-19 MED ORDER — "BD FILTER NEEDLE 18G X 1-1/2"" MISC": Status: DC

## 2015-07-19 NOTE — Telephone Encounter (Signed)
We need to check them half way between injections. We can start checking after 2 months. It probably would be best to wait until next visit to do it, but I will leave it up to him. If he wants to have them done at 2 months, let's order a testosterone, total, free and this needs to be fasting, at 8 AM.

## 2015-07-19 NOTE — Telephone Encounter (Signed)
Called pt and advised him per Dr Charlean SanfilippoGherghe's message concerning how to do the testosterone injection. Pt voiced understanding. Pt wants to know when he needs to have labs done again to check his testosterone level and will he need to be fasting? Please advise.

## 2015-07-19 NOTE — Telephone Encounter (Signed)
Ins does not cover filter needles. Pharmacist recommends sendinding in 18 g x 1.5" 3 mL syringe. Will send to pharmacy for pt.

## 2015-07-20 ENCOUNTER — Other Ambulatory Visit: Payer: Self-pay | Admitting: *Deleted

## 2015-07-20 DIAGNOSIS — E230 Hypopituitarism: Secondary | ICD-10-CM

## 2015-07-20 NOTE — Telephone Encounter (Signed)
Called pt and advised him per Dr Charlean SanfilippoGherghe's message below. Pt voiced understanding that he needs to be fasting for labs.

## 2015-07-22 ENCOUNTER — Other Ambulatory Visit: Payer: Self-pay | Admitting: Internal Medicine

## 2015-07-22 ENCOUNTER — Other Ambulatory Visit: Payer: Self-pay | Admitting: Family Medicine

## 2015-07-26 NOTE — Telephone Encounter (Signed)
Call pt:  He was supposed to return in July 2016 to have his labs repeated and he never had that done. Can you verify that he is taking these medications as prescribed?

## 2015-07-26 NOTE — Telephone Encounter (Signed)
Last labs 10/2014, abnormal. pls advise

## 2015-07-26 NOTE — Telephone Encounter (Signed)
Lm on pts vm requesting a call back 

## 2015-07-26 NOTE — Telephone Encounter (Signed)
[facility]  Please call him back at [phone].

## 2015-07-27 NOTE — Telephone Encounter (Signed)
Lm on pts vm requesting a call back 

## 2015-07-28 NOTE — Telephone Encounter (Signed)
Spoke to pt who states that he has received Rx. appt scheduled for f/u

## 2015-07-31 HISTORY — PX: TOOTH EXTRACTION: SUR596

## 2015-07-31 HISTORY — PX: COLONOSCOPY: SHX174

## 2015-08-10 ENCOUNTER — Telehealth: Payer: Self-pay | Admitting: *Deleted

## 2015-08-10 ENCOUNTER — Ambulatory Visit (INDEPENDENT_AMBULATORY_CARE_PROVIDER_SITE_OTHER): Payer: 59 | Admitting: Family Medicine

## 2015-08-10 ENCOUNTER — Encounter: Payer: Self-pay | Admitting: Family Medicine

## 2015-08-10 VITALS — BP 116/64 | HR 62 | Temp 98.2°F | Wt 218.0 lb

## 2015-08-10 DIAGNOSIS — E785 Hyperlipidemia, unspecified: Secondary | ICD-10-CM

## 2015-08-10 DIAGNOSIS — F411 Generalized anxiety disorder: Secondary | ICD-10-CM

## 2015-08-10 DIAGNOSIS — F1120 Opioid dependence, uncomplicated: Secondary | ICD-10-CM

## 2015-08-10 DIAGNOSIS — E039 Hypothyroidism, unspecified: Secondary | ICD-10-CM

## 2015-08-10 DIAGNOSIS — E1121 Type 2 diabetes mellitus with diabetic nephropathy: Secondary | ICD-10-CM

## 2015-08-10 DIAGNOSIS — G8929 Other chronic pain: Secondary | ICD-10-CM | POA: Diagnosis not present

## 2015-08-10 DIAGNOSIS — E221 Hyperprolactinemia: Secondary | ICD-10-CM

## 2015-08-10 DIAGNOSIS — IMO0002 Reserved for concepts with insufficient information to code with codable children: Secondary | ICD-10-CM

## 2015-08-10 DIAGNOSIS — E1165 Type 2 diabetes mellitus with hyperglycemia: Secondary | ICD-10-CM

## 2015-08-10 DIAGNOSIS — E230 Hypopituitarism: Secondary | ICD-10-CM

## 2015-08-10 LAB — COMPREHENSIVE METABOLIC PANEL
ALK PHOS: 46 U/L (ref 39–117)
ALT: 36 U/L (ref 0–53)
AST: 25 U/L (ref 0–37)
Albumin: 4.5 g/dL (ref 3.5–5.2)
BUN: 13 mg/dL (ref 6–23)
CO2: 33 mEq/L — ABNORMAL HIGH (ref 19–32)
Calcium: 10.1 mg/dL (ref 8.4–10.5)
Chloride: 103 mEq/L (ref 96–112)
Creatinine, Ser: 0.96 mg/dL (ref 0.40–1.50)
GFR: 87.67 mL/min (ref >60.00)
GLUCOSE: 163 mg/dL — AB (ref 70–99)
POTASSIUM: 5 meq/L (ref 3.5–5.1)
Sodium: 142 mEq/L (ref 135–145)
TOTAL PROTEIN: 6.8 g/dL (ref 6.0–8.3)
Total Bilirubin: 0.5 mg/dL (ref 0.2–1.2)

## 2015-08-10 LAB — LIPID PANEL
CHOL/HDL RATIO: 4
Cholesterol: 131 mg/dL (ref 0–200)
HDL: 29.3 mg/dL — AB (ref >39.00)
LDL Cholesterol: 64 mg/dL (ref 0–99)
NONHDL: 101.45
Triglycerides: 187 mg/dL — ABNORMAL HIGH (ref 0.0–149.0)
VLDL: 37.4 mg/dL (ref 0.0–40.0)

## 2015-08-10 MED ORDER — TESTOSTERONE CYPIONATE 200 MG/ML IM SOLN: 50.0000 mg | INTRAMUSCULAR | Status: DC

## 2015-08-10 NOTE — Telephone Encounter (Signed)
OK to order a larger vials or more vials at a time to cover him for at least 2 months.

## 2015-08-10 NOTE — Assessment & Plan Note (Signed)
Improving, followed by endocrinology.

## 2015-08-10 NOTE — Progress Notes (Signed)
50 yo pleasant male with h/o depression, chronic pain, hypothyroidism, DM, HLD, hyperprolactinemia, low T, here for 3 month follow up;  H/o narcotic dependence, I no longer refill his narcotics.  On methadone, seen at [facility].  DM- now managed by endocrinology, Dr. Jasmine December.  Has been quite difficult to control. Was last seen by Dr. Cruzita Lederer on 07/15/15.  Note reviewed. Added Tanzeum 50 mg weekly- it is causing nausea and vomiting but his FSBS are improving.  Lab Results  Component Value Date   HGBA1C 9.3 07/15/2015   Also receiving testosterone injections through endocrinology. Lab Results  Component Value Date   TESTOSTERONE 131* 07/18/2015      Wt Readings from Last 3 Encounters:  08/10/15 218 lb (98.884 kg)  07/15/15 225 lb (102.059 kg)  05/13/15 221 lb (100.245 kg)      HLD- has not been quite at goal for a diabetic.  On crestor.  Lab Results  Component Value Date   CHOL 213* 11/01/2014   HDL 37.30* 11/01/2014   LDLCALC 73 02/11/2014   LDLDIRECT 136.0 11/01/2014   TRIG 330.0* 11/01/2014   CHOLHDL 6 11/01/2014    Wt Readings from Last 3 Encounters:  08/10/15 218 lb (98.884 kg)  07/15/15 225 lb (102.059 kg)  05/13/15 221 lb (100.245 kg)    Depression- stable off rx.  Patient Active Problem List   Diagnosis Date Noted  . Hypogonadotropic hypogonadism ([place]) 05/13/2015  . Hyperprolactinemia ([place]) 05/13/2015  . Elevated liver function tests 06/28/2014  . Uncontrolled type 2 diabetes mellitus with nephropathy ([place]) 05/31/2014  . Family history of premature CAD 02/11/2014  . DOE (dyspnea on exertion) 02/11/2014  . Hypersomnia 11/23/2011  . Anxiety 11/05/2011  . Depression 08/23/2011  . Chronic pain 06/27/2011  . Narcotic dependence ([place]) 11/16/2010  . GENERALIZED ANXIETY DISORDER 07/26/2010  . Hypothyroidism 01/17/2010  . HERPES LABIALIS 11/03/2009  . PURE HYPERCHOLESTEROLEMIA 05/26/2009  . THYROID STIMULATING HORMONE, ABNORMAL 04/06/2009  . FATIGUE  03/30/2009  . TOBACCO ABUSE 12/30/2008  . HLD (hyperlipidemia) 12/04/2006  . GOUT 12/04/2006  . INSOMNIA 12/04/2006   Past Medical History  Diagnosis Date  . Hyperlipidemia   . Sleep apnea   . Borderline diabetes   . Anxiety   . Chronic pain   . Depression   . Narcotic dependence [facility])    Past Surgical History  Procedure Laterality Date  . Removal of bone spur     Social History  Substance Use Topics  . Smoking status: Never Smoker   . Smokeless tobacco: Current User    Types: Snuff  . Alcohol Use: None   Family History  Problem Relation Age of Onset  . Heart disease Father   . Cancer Father     unsure what kind   Allergies  Allergen Reactions  . Gemfibrozil     REACTION: diarrhea   Current Outpatient Prescriptions on File Prior to Visit  Medication Sig Dispense Refill  . Albiglutide 50 MG PEN Inject under skin 50 mg once a week 4 each 2  . aspirin 81 MG tablet Take 81 mg by mouth daily.      . B-D INS SYRINGE 0.5CC/31GX5/16 31G X 5/16" 0.5 ML MISC USE 3 TIMES DAILY 100 each 5  . Blood Glucose Monitoring Suppl (ONE TOUCH ULTRA MINI) W/DEVICE KIT Use as directed to test blood sugar twice daily 250.00 1 each 0  . colchicine 0.6 MG tablet Take 0.6 mg by mouth daily as needed. For gout flare ups Takes 1 tab daily  until symptoms subsides    . fenofibrate 160 MG tablet Take 1 tablet (160 mg total) by mouth daily. 90 tablet 1  . fluticasone (FLONASE) 50 MCG/ACT nasal spray PLACE 2 SPRAYS INTO BOTH NOSTRILS DAILY. 16 g 3  . HYDROcodone-acetaminophen (NORCO/VICODIN) 5-325 MG per tablet Take 1 tablet by mouth every 6 (six) hours as needed. for pain  0  . Insulin Detemir (LEVEMIR FLEXPEN) 100 UNIT/ML Pen Inject 40 Units into the skin every morning. 15 mL 2  . Insulin Pen Needle 32G X 4 MM MISC Use to inject insulin 1 time daily as instructed. 90 each 3  . insulin regular human CONCENTRATED (HUMULIN R) 500 UNIT/ML injection INJECT UNDER SKIN 0.1 - 0.12 ML BEFORE A MEAL 3X A DAY  20 mL 1  . LORazepam (ATIVAN) 0.5 MG tablet Take 2 tablets (1 mg total) by mouth once. Before MRI 4 tablet 0  . metFORMIN (GLUCOPHAGE) 1000 MG tablet TAKE 1 TABLET (1,000 MG TOTAL) BY MOUTH 2 (TWO) TIMES DAILY WITH A MEAL. 180 tablet 1  . methadone (DOLOPHINE) 10 MG/ML solution Take 105 mg by mouth daily.     . Multiple Vitamin (MULTIVITAMIN) tablet Take 2 tablets by mouth 2 (two) times daily.    . ONE TOUCH ULTRA TEST test strip USE 8 TIMES A DAY AS INSTRUCTED 300 each 5  . ONETOUCH DELICA LANCETS 54Y MISC Use to test blood sugar 4 times daily as instructed. 200 each 5  . rosuvastatin (CRESTOR) 10 MG tablet Take 1 tablet (10 mg total) by mouth daily. 90 tablet 1  . SYRINGE-NEEDLE, DISP, 3 ML 18G X 1-1/2" 3 ML MISC Use to inject testosterone. 100 each 1  . testosterone cypionate (DEPOTESTOSTERONE CYPIONATE) 200 MG/ML injection Inject 0.25 mLs (50 mg total) into the muscle once a week. 1 mL 2  . valACYclovir (VALTREX) 500 MG tablet Take 500 mg by mouth 2 (two) times daily as needed. For cold sore flare ups Takes twice daily until cold sore clears up    . vitamin B-12 (CYANOCOBALAMIN) 1000 MCG tablet Take 1,000 mcg by mouth daily.    Marland Kitchen zolpidem (AMBIEN) 10 MG tablet Take 1 tablet (10 mg total) by mouth at bedtime as needed for sleep. 15 tablet 0  . zolpidem (AMBIEN) 5 MG tablet Take 1 tablet (5 mg total) by mouth once. 2 tablet 0   No current facility-administered medications on file prior to visit.     The PMH, PSH, Social History, Family History, Medications, and allergies have been reviewed in [facility], and have been updated if relevant.   Review of Systems  Constitutional: Negative.   HENT: Negative.   Respiratory: Negative.   Cardiovascular: Negative.   Gastrointestinal: Negative.   Endocrine: Negative.   Genitourinary: Negative.   Musculoskeletal: Negative.   Skin: Negative.   Allergic/Immunologic: Negative.   Neurological: Negative.   Hematological: Negative.    Psychiatric/Behavioral: Negative.   All other systems reviewed and are negative.   BP 116/64 mmHg  Pulse 62  Temp(Src) 98.2 F (36.8 C) (Oral)  Wt 218 lb (98.884 kg)  SpO2 98%   Physical Exam  Constitutional: He is oriented to person, place, and time and well-developed, well-nourished, and in no distress. No distress.  HENT:  Head: Normocephalic.  Eyes: Conjunctivae are normal.  Cardiovascular: Normal rate and regular rhythm.   Pulmonary/Chest: Effort normal and breath sounds normal.  Musculoskeletal: He exhibits no edema.  Neurological: He is alert and oriented to person, place, and time.  Skin:  Skin is warm and dry. He is not diaphoretic.  Psychiatric: Mood, memory, affect and judgment normal.  Nursing note and vitals reviewed.

## 2015-08-10 NOTE — Progress Notes (Signed)
Pre visit review using our clinic review tool, if applicable. No additional management support is needed unless otherwise documented below in the visit note. 

## 2015-08-10 NOTE — Assessment & Plan Note (Signed)
Not quite at goal for a diabetic. Recheck lipid panel today.

## 2015-08-10 NOTE — Telephone Encounter (Signed)
Pt called stating that the vials he gets are so small that by the time he gets to his 4th injection, there will not be anything in there. He wants to know if Dr Elvera LennoxGherghe can order a larger vial next rx? Please advise.

## 2015-08-10 NOTE — Assessment & Plan Note (Signed)
Followed by methadone clinic. 

## 2015-08-10 NOTE — Telephone Encounter (Signed)
Done

## 2015-08-10 NOTE — Patient Instructions (Signed)
Great to see you. Happy New Year.  I will call you with your lab results.   

## 2015-08-16 ENCOUNTER — Telehealth: Payer: Self-pay | Admitting: Internal Medicine

## 2015-08-16 NOTE — Telephone Encounter (Signed)
Pt is still having issues with the testosterone rx. It was increased to a 20 mm bottle but that would be a 10 mon supply so the dosage need to be higher in order for this to work. Please call the pharmacy to know how it needs to be sent in

## 2015-08-16 NOTE — Telephone Encounter (Signed)
If he uses 50 mg per week >> 200 mg per month >> was getting this amount in 1 ml. If we increased to a 20 ml bottle, it is correct that he is getting a 20-mo supply. Can we switch to a smaller bottle. I don't have a good solution to this...Marland Kitchenother than giving him a smaller bottle.

## 2015-08-16 NOTE — Telephone Encounter (Signed)
Please read message below. Evidently, I am not ordering this correctly, can you please help with this? Thank you!

## 2015-08-17 MED ORDER — TESTOSTERONE CYPIONATE 200 MG/ML IM SOLN: 100.0000 mg | INTRAMUSCULAR | Status: DC

## 2015-08-17 NOTE — Telephone Encounter (Signed)
Called the pharmacy and spoke with the pharmacist. She advised that Dr Elvera Lennox change to dosage to .50 mLs per week (instead of .25 mLs per week), they will then be able to give him 2 mLs a month so he will not lose any medication. Be advised.

## 2015-08-17 NOTE — Telephone Encounter (Signed)
OK to send the Rx like this - we will likely need to increase to that dose, soon anyway. Please let pt know to continue current dosing, for now, though, until we recheck his level.

## 2015-08-17 NOTE — Telephone Encounter (Signed)
Reprinted correct dosage. Called pt and advised him to continue on his current dosage. Pt voiced understanding. Be advised.

## 2015-08-23 ENCOUNTER — Telehealth: Payer: Self-pay

## 2015-08-23 NOTE — Telephone Encounter (Signed)
Pt said crestor is no longer covered under ins plan and request substitute be sent to [facility]. Requested pt to contact ins co to see what substitute med would be approved by his ins co. Pt will cb with information.

## 2015-08-24 NOTE — Telephone Encounter (Signed)
Generic crestor is a reasonable alternative.  Ok to refill generic crestor at same dose as he is currently taking.

## 2015-08-24 NOTE — Telephone Encounter (Signed)
Pt left v/m; pt spoke with [facility] and was given only name of one med as substitute for Crestor; Rosuvastatin(this is generic for Crestor). Martin Downs was advised by [facility] there is a large list of possible substitutes for Crestor on [facility] website that pts physician can refer to if needed. Pt request cb.[facility].

## 2015-10-04 ENCOUNTER — Other Ambulatory Visit: Payer: Self-pay | Admitting: Internal Medicine

## 2015-10-04 ENCOUNTER — Other Ambulatory Visit: Payer: Self-pay | Admitting: Family Medicine

## 2015-10-11 ENCOUNTER — Other Ambulatory Visit (INDEPENDENT_AMBULATORY_CARE_PROVIDER_SITE_OTHER): Payer: 59 | Admitting: *Deleted

## 2015-10-11 ENCOUNTER — Encounter: Payer: Self-pay | Admitting: Internal Medicine

## 2015-10-11 ENCOUNTER — Ambulatory Visit (INDEPENDENT_AMBULATORY_CARE_PROVIDER_SITE_OTHER): Payer: 59 | Admitting: Internal Medicine

## 2015-10-11 VITALS — BP 124/78 | HR 87 | Temp 98.8°F | Resp 12 | Wt 227.6 lb

## 2015-10-11 DIAGNOSIS — E1165 Type 2 diabetes mellitus with hyperglycemia: Secondary | ICD-10-CM

## 2015-10-11 DIAGNOSIS — E1121 Type 2 diabetes mellitus with diabetic nephropathy: Secondary | ICD-10-CM | POA: Diagnosis not present

## 2015-10-11 DIAGNOSIS — IMO0002 Reserved for concepts with insufficient information to code with codable children: Secondary | ICD-10-CM

## 2015-10-11 DIAGNOSIS — E230 Hypopituitarism: Secondary | ICD-10-CM | POA: Diagnosis not present

## 2015-10-11 LAB — POCT GLYCOSYLATED HEMOGLOBIN (HGB A1C): HEMOGLOBIN A1C: 8.1

## 2015-10-11 MED ORDER — TESTOSTERONE CYPIONATE 200 MG/ML IM SOLN: 50.0000 mg | INTRAMUSCULAR | Status: DC

## 2015-10-11 MED ORDER — DAPAGLIFLOZIN PROPANEDIOL 10 MG PO TABS: 10.0000 mg | ORAL_TABLET | Freq: Every day | ORAL | Status: DC

## 2015-10-11 NOTE — Progress Notes (Signed)
Patient ID: Martin Downs, male   DOB: 20-Mar-1966, 50 y.o.   MRN: [id number]  HPI: Martin Downs is a 50 y.o.-year-old male, returning for f/u for DM2 dx 2014, insulin-dependent since 02/2014, uncontrolled, with complications (mild CKD). Also,  Hypogonadotropic hypogonadism. Last visit 2 mo ago.  DM2: Last hemoglobin A1c was: Lab Results  Component Value Date   HGBA1C 8.1 10/11/2015   HGBA1C 9.3 07/15/2015   HGBA1C 9.0* 03/10/2015   Pt was on a regimen of: - Metformin 1000 mg bid - Levemir 40 units 2x a day  - NovoLog 15 min before a meal:  - 30 units before a smaller meal - 35 units before a larger meal    Novolog 15 units for a snack. We stopped Actoplus 15-850 mg in 02/2014. We stopped Glipizide when we increased Novolog. Insurance does not cover Invokana.  He takes: - U500 insulin: - before breakfast >> 5-8 units - before lunch >> 5-8 units - before dinner >> 5-8 units - Metformin 1000 mg bid - Tanzeum 50 mg weekly Stopped Levemir 40 units in am, b/c increased sugars on it >> stopped 3 weeks ago as sugars 400-500s.  Pt checks sugars 3x a day: - am: 120, 236-373 >> 170-356 (not fasting) >> 208-380 >> 196-222 >> 69, 100, 204-340 >> 100, 104-304 >> 168-248 - 2h after b'fast: n/c >> 284-365, 441, 481 >> 210-419 >> 166-395 >> 216-244 >> 166-300, 380 >> n/c - lunch: n/c >> 198-400 >> 200-305 >> 175-298 >> 204-237 >> 125, 159-300 >> 119, 281-320 >> 196, 218-266 - 2h after lunch: 280-421 >> 149-199, 390 >> 228-261 >> 205-307 >> 210-252, 300 >> n/c - dinner: 242-345 >> 165-294 >> 219--241 >> 147-220 >> 218-240 >> 94, 164-328 >> 122-242, 288 >> 142, 162-230, 297, 515 - 2h after dinner: n/c >> 175-307 >> 237-349 >> 155-314 >> 228-260 >> 241-379 >> n/c - bedtime: 217-425 >> 163, 220-377 >> 208-340 >> 166-270 >> 207-225 >> 211-281 >> 185-254 >> 155, 201-290 - nighttime: 88 (sweating), 187-443 >> 394 >> 190-240 >> n/c >> 180-203, 354  Pt's meals are - grazes! - Breakfast:  oatmeal or yoghurt or fruit bar/cup (largest meal) - Lunch: PB sandwich - Dinner: chicken leftovers - Snacks: sandwich, 1 pack of crackers, diet Mountain dew  - Mild CKD, last BUN/creatinine:  Lab Results  Component Value Date   BUN 13 08/10/2015   CREATININE 0.96 08/10/2015  Not on an ACEI. - last set of lipids: Lab Results  Component Value Date   CHOL 131 08/10/2015   HDL 29.30* 08/10/2015   LDLCALC 64 08/10/2015   LDLDIRECT 136.0 11/01/2014   TRIG 187.0* 08/10/2015   CHOLHDL 4 08/10/2015  On Crestor and Fenofibrate. - last eye exam was on 12/09/2014. No DR. - no numbness and tingling in his feet.  Hypogonadotropic hypogonadism: -  Diagnosed 2 years ago - He was on Androgel 2 years ago >> level did not increase and he did not feel better -  We checked his pituitary function at last visit >>  Labs normal except inappropriately normal LH, FSH , and low  Testosterone.  Monomeric prolactin is normal.   Component     Latest Ref Rng 05/16/2015  Testosterone     300 - 890 ng/dL 213 (L)  Sex Hormone Binding     10 - 50 nmol/L 28  Testosterone Free     47.0 - 244.0 pg/mL 26.9 (L)  Testosterone-% Free     1.6 - 2.9 %  2.0  IGF-I, LC/MS     50 - 317 ng/mL 99  Z-Score (Male)     -2.0-+2.0 SD -0.7  Prolactin, Total     2.0 - 18.0 ng/mL 35.4 (H)  Prolactin, Monomeric     3.4 - 14.8 ng/mL 11.4  LH     1.50 - 9.30 mIU/mL 2.03  FSH     1.4 - 18.1 mIU/ML 5.9  Cortisol, Plasma      14.2  C206 ACTH     6 - 50 pg/mL 32   Previous labs (drawn at 8:33 AM)   Prolactin 38.6 (2.1-17.1)  FSH 5.6, LH 2.0  Total testosterone 134 (300-890), bioavailable testosterone 35.5 ng/dL (161.0-960.4), free testosterone 18 pg/mL (47-244), SHBG 29 (10-50)  PSA 0.16  Estradiol 17.1 (0-39)  He had a pituitary MRI yesterday (07/14/2015) >>  No pituitary lesions.  We started Testosterone inj: 50 mg weekly >> feels good on this, no SEs.  Hyperprolactinemia: - found during investigation for  low testosterone, by Dr. Mena Goes, his urologist. -  Turned out to be caused by macroprolactin, which does not require further investigation  Pt does c/o fatigue, low libido and problems with erections, but no gynecomastia or galactorrhea.   I reviewed pt's medications, allergies, PMH, social hx, family hx, and changes were documented in the history of present illness. Otherwise, unchanged from my initial visit note.  ROS: Constitutional: no weight gain/loss,no fatigue, no subjective hyperthermia/hypothermia Eyes: no blurry vision, no xerophthalmia ENT: no sore throat, no nodules palpated in throat, no dysphagia/odynophagia, no hoarseness Cardiovascular: no CP/SOB/palpitations/leg swelling Respiratory: no cough/SOB Gastrointestinal: no N/V/D/C Musculoskeletal: no muscle/joint aches Skin: no rashes Neurological: no tremors/numbness/tingling/dizziness  PE: BP 124/78 mmHg  Pulse 87  Temp(Src) 98.8 F (37.1 C) (Oral)  Resp 12  Wt 227 lb 9.6 oz (103.239 kg)  SpO2 97% Body mass index is 33.6 kg/(m^2).  Wt Readings from Last 3 Encounters:  10/11/15 227 lb 9.6 oz (103.239 kg)  08/10/15 218 lb (98.884 kg)  07/15/15 225 lb (102.059 kg)  Constitutional: overweight, in NAD Eyes: PERRLA, EOMI, no exophthalmos ENT: moist mucous membranes, no thyromegaly, no cervical lymphadenopathy Cardiovascular: RRR, No MRG Respiratory: CTA B Gastrointestinal: abdomen soft, NT, ND, BS+ Musculoskeletal: no deformities, strength intact in all 4 Skin: moist, warm, no rashes Neurological: no tremor with outstretched hands, DTR normal in all 4  ASSESSMENT: 1. DM2, insulin-dependent, uncontrolled, with complications - mild ckd  2. Hypogonadotropic hypogonadism  PLAN:  1. Patient with h/o uncontrolled diabetes, with increased insulin resistance. Sugars improved on the U500 insulin and now on Tanzeum 50 mg weekly, but still high >> will try to add [place]. He cannot take a higher insulin dose as he feels  his sugars are dropping too fast after this. If a SGLT2 inh not covered, may need to try basal insulin. - We discussed in the past about a VGO system or another type of pump >> but he works outside and sweats a lot >> he does not think he can wear this - I suggested to:  Patient Instructions  Please come back at 8 am, fasting, for a new testosterone level.  Please continue: - U500 insulin: - before breakfast >> 5-8 units - before lunch >> 5-8 units - before dinner >> 5-8 units - Metformin 1000 mg bid - Tanzeum 50 mg weekly  Please add Farxiga 10 mg daily in am.  Please return in 3 months with your sugar log.  - continue checking sugars at different times of  the day - check 3 times a day, rotating checks - advised for yearly eye exams - he is UTD - check HbA1c today >> 8.1% (lower) - Return to clinic in 3 mo with sugar log.   2. Hypogonadotropic Hypogonadism - Pituitary MRI normal  - reviewed rest of the pituitary tests >> normal - now on Testosterone inj weekly after this (50 mg) - will check the following labs, halfway b/w injections: Orders Placed This Encounter  Procedures  . Testosterone Total,Free,Bio, Males  . PSA  . CBC  - Advised to ger annual DREs per PCP or urologist.  Component     Latest Ref Rng 10/18/2015 10/18/2015         7:52 AM  7:52 AM  WBC     4.0 - 10.5 K/uL 10.9 (H)   RBC     4.22 - 5.81 Mil/uL 5.08   Hemoglobin     13.0 - 17.0 g/dL 84.615.0   HCT     96.239.0 - 95.252.0 % 45.6   MCV     78.0 - 100.0 fl 89.8   MCHC     30.0 - 36.0 g/dL 84.133.0   RDW     32.411.5 - 40.115.5 % 14.0   Platelets     150.0 - 400.0 K/uL 266.0   Testosterone     250 - 827 ng/dL 027949 (H) 253949 (H)  Albumin     3.6 - 5.1 g/dL  4.5  Sex Horm Binding Glob, Serum     10 - 50 nmol/L 25 25  Testosterone Free     47.0 - 244.0 pg/mL 259.1 (H) 186.3  Testosterone, Bioavailable     130.5 - 681.7 ng/dL  664.4383.1  PSA     0.340.10 - 4.00 ng/mL 0.91    White blood count is slightly high. Hemoglobin,  hematocrit, PSA are normal. I'm not sure why the testosterone was checked twice with different results for the free testosterone. Because of this, I will continue the same testosterone supplementation and recheck his testosterone at next visit.

## 2015-10-11 NOTE — Patient Instructions (Signed)
Please come back at 8 am, fasting, for a new testosterone level.  Please continue: - U500 insulin: - before breakfast >> 5-8 units - before lunch >> 5-8 units - before dinner >> 5-8 units - Metformin 1000 mg bid - Tanzeum 50 mg weekly  Please add Farxiga 10 mg daily in am.  Please return in 3 months with your sugar log.

## 2015-10-17 ENCOUNTER — Other Ambulatory Visit: Payer: 59

## 2015-10-18 ENCOUNTER — Other Ambulatory Visit: Payer: Self-pay | Admitting: Internal Medicine

## 2015-10-18 ENCOUNTER — Other Ambulatory Visit (INDEPENDENT_AMBULATORY_CARE_PROVIDER_SITE_OTHER): Payer: 59

## 2015-10-18 DIAGNOSIS — E230 Hypopituitarism: Secondary | ICD-10-CM | POA: Diagnosis not present

## 2015-10-18 LAB — CBC
HCT: 45.6 % (ref 39.0–52.0)
HEMOGLOBIN: 15 g/dL (ref 13.0–17.0)
MCHC: 33 g/dL (ref 30.0–36.0)
MCV: 89.8 fl (ref 78.0–100.0)
PLATELETS: 266 10*3/uL (ref 150.0–400.0)
RBC: 5.08 Mil/uL (ref 4.22–5.81)
RDW: 14 % (ref 11.5–15.5)
WBC: 10.9 10*3/uL — AB (ref 4.0–10.5)

## 2015-10-18 LAB — PSA: PSA: 0.91 ng/mL (ref 0.10–4.00)

## 2015-10-19 LAB — TESTOSTERONE, FREE AND TOTAL (INCLUDES SHBG)-(MALES)
SEX HORMONE BINDING: 25 nmol/L (ref 10–50)
TESTOSTERONE: 949 ng/dL — AB (ref 250–827)
Testosterone, Free: 259.1 pg/mL — ABNORMAL HIGH (ref 47.0–244.0)
Testosterone-% Free: 2.7 % (ref 1.6–2.9)

## 2015-10-19 LAB — TESTOSTERONE TOTAL,FREE,BIO, MALES
ALBUMIN: 4.5 g/dL (ref 3.6–5.1)
SEX HORMONE BINDING: 25 nmol/L (ref 10–50)
TESTOSTERONE FREE: 186.3 pg/mL (ref 47.0–244.0)
Testosterone, Bioavailable: 383.1 ng/dL (ref 130.5–681.7)
Testosterone: 949 ng/dL — ABNORMAL HIGH (ref 250–827)

## 2015-10-20 ENCOUNTER — Encounter: Payer: Self-pay | Admitting: Internal Medicine

## 2015-10-21 ENCOUNTER — Other Ambulatory Visit: Payer: Self-pay | Admitting: Internal Medicine

## 2015-11-05 ENCOUNTER — Other Ambulatory Visit: Payer: Self-pay | Admitting: Internal Medicine

## 2015-11-06 ENCOUNTER — Other Ambulatory Visit: Payer: Self-pay | Admitting: Internal Medicine

## 2015-11-22 ENCOUNTER — Other Ambulatory Visit: Payer: Self-pay | Admitting: Internal Medicine

## 2015-12-12 ENCOUNTER — Other Ambulatory Visit: Payer: Self-pay | Admitting: Family Medicine

## 2015-12-12 ENCOUNTER — Other Ambulatory Visit: Payer: Self-pay | Admitting: Internal Medicine

## 2016-01-02 LAB — HM DIABETES EYE EXAM

## 2016-01-03 ENCOUNTER — Encounter: Payer: Self-pay | Admitting: Internal Medicine

## 2016-01-08 ENCOUNTER — Other Ambulatory Visit: Payer: Self-pay | Admitting: Family Medicine

## 2016-01-09 MED ORDER — ROSUVASTATIN CALCIUM 10 MG PO TABS: ORAL_TABLET | ORAL | Status: DC

## 2016-01-09 MED ORDER — FENOFIBRATE 160 MG PO TABS: ORAL_TABLET | ORAL | Status: DC

## 2016-01-09 NOTE — Addendum Note (Signed)
Addended by: Damita LackLORING, Kaesha Kirsch S on: 01/09/2016 02:40 PM   Modules accepted: Orders

## 2016-01-09 NOTE — Addendum Note (Signed)
Addended by: Damita LackLORING, Rosevelt Luu S on: 01/09/2016 02:41 PM   Modules accepted: Orders

## 2016-01-16 ENCOUNTER — Other Ambulatory Visit (INDEPENDENT_AMBULATORY_CARE_PROVIDER_SITE_OTHER): Payer: 59

## 2016-01-16 ENCOUNTER — Other Ambulatory Visit: Payer: Self-pay | Admitting: Internal Medicine

## 2016-01-16 DIAGNOSIS — IMO0002 Reserved for concepts with insufficient information to code with codable children: Secondary | ICD-10-CM

## 2016-01-16 DIAGNOSIS — E230 Hypopituitarism: Secondary | ICD-10-CM

## 2016-01-16 DIAGNOSIS — E1165 Type 2 diabetes mellitus with hyperglycemia: Principal | ICD-10-CM

## 2016-01-16 DIAGNOSIS — E1121 Type 2 diabetes mellitus with diabetic nephropathy: Secondary | ICD-10-CM

## 2016-01-16 LAB — BASIC METABOLIC PANEL
BUN: 11 mg/dL (ref 6–23)
CO2: 29 meq/L (ref 19–32)
CREATININE: 1.18 mg/dL (ref 0.40–1.50)
Calcium: 9.4 mg/dL (ref 8.4–10.5)
Chloride: 102 mEq/L (ref 96–112)
GFR: 68.98 mL/min (ref >60.00)
GLUCOSE: 134 mg/dL — AB (ref 70–99)
Potassium: 4.6 mEq/L (ref 3.5–5.1)
Sodium: 140 mEq/L (ref 135–145)

## 2016-01-17 LAB — TESTOSTERONE TOTAL,FREE,BIO, MALES
Albumin: 4.4 g/dL (ref 3.6–5.1)
SEX HORMONE BINDING: 28 nmol/L (ref 10–50)
TESTOSTERONE FREE: 185.8 pg/mL (ref 47.0–244.0)
TESTOSTERONE: 1007 ng/dL — AB (ref 250–827)
Testosterone, Bioavailable: 374.1 ng/dL (ref 130.5–681.7)

## 2016-01-18 ENCOUNTER — Telehealth: Payer: Self-pay

## 2016-01-18 NOTE — Telephone Encounter (Signed)
Called patient, gave patient lab results. Patient asked for a copy of printed results when he comes tomorrow for his appointment. No questions or concerns at this time.

## 2016-01-19 ENCOUNTER — Encounter: Payer: Self-pay | Admitting: Internal Medicine

## 2016-01-19 ENCOUNTER — Ambulatory Visit (INDEPENDENT_AMBULATORY_CARE_PROVIDER_SITE_OTHER): Payer: 59 | Admitting: Internal Medicine

## 2016-01-19 VITALS — BP 128/80 | HR 81 | Ht 69.0 in | Wt 218.0 lb

## 2016-01-19 DIAGNOSIS — E1121 Type 2 diabetes mellitus with diabetic nephropathy: Secondary | ICD-10-CM

## 2016-01-19 DIAGNOSIS — IMO0002 Reserved for concepts with insufficient information to code with codable children: Secondary | ICD-10-CM

## 2016-01-19 DIAGNOSIS — E230 Hypopituitarism: Secondary | ICD-10-CM | POA: Diagnosis not present

## 2016-01-19 DIAGNOSIS — E1165 Type 2 diabetes mellitus with hyperglycemia: Secondary | ICD-10-CM

## 2016-01-19 LAB — POCT GLYCOSYLATED HEMOGLOBIN (HGB A1C): HEMOGLOBIN A1C: 8.4

## 2016-01-19 MED ORDER — INSULIN DETEMIR 100 UNIT/ML FLEXPEN: 30.0000 [IU] | PEN_INJECTOR | Freq: Every morning | SUBCUTANEOUS | Status: DC

## 2016-01-19 MED ORDER — INSULIN PEN NEEDLE 32G X 4 MM MISC: Status: DC

## 2016-01-19 NOTE — Patient Instructions (Addendum)
Please restart: - Levemir 20 units at bedtime.  In 4 days, if sugars in am are still >150, increase to 25 units. In 4 days, if sugars in am are still >150, increase to 30 units.  Please continue: - Metformin 1000 mg bid - Tanzeum 50 mg weekly - Farxiga 10 mg daily in am   Continue: - Testosterone injection 50 mg weekly.  Please return in 1.5 months with your sugar log.

## 2016-01-19 NOTE — Progress Notes (Signed)
Patient ID: Martin Downs, male   DOB: 06-Jul-1966, 50 y.o.   MRN: [id number]  HPI: Martin Downs is a 50 y.o.-year-old male, returning for f/u for DM2 dx 2014, insulin-dependent since 02/2014, uncontrolled, with complications (mild CKD). Also,  Hypogonadotropic hypogonadism. Last visit 3 mo ago.  DM2: Last hemoglobin A1c was: Lab Results  Component Value Date   HGBA1C 8.1 10/11/2015   HGBA1C 9.3 07/15/2015   HGBA1C 9.0* 03/10/2015   Pt was on a regimen of: - Metformin 1000 mg bid - Levemir 40 units 2x a day  - NovoLog 15 min before a meal:  - 30 units before a smaller meal - 35 units before a larger meal    Novolog 15 units for a snack. We stopped Actoplus 15-850 mg in 02/2014. We stopped Glipizide when we increased Novolog. Insurance does not cover Invokana.  He takes: not taking as it drops his sugars too low - Metformin 1000 mg bid - Tanzeum 50 mg weekly - Farxiga 10 mg daily in am (added 09/2015) Stopped Levemir 40 units in am, b/c increased sugars on it >> stopped 3 weeks ago as sugars 400-500s.  Pt checks sugars 2-3x a day: - am: 170-356 (not fasting) >> 208-380 >> 196-222 >> 69, 100, 204-340 >> 100, 104-304 >> 168-248 >> 117-240, 325 - 2h after b'fast: n/c >> 284-365, 441, 481 >> 210-419 >> 166-395 >> 216-244 >> 166-300, 380 >> n/c >> 114 - lunch: n/c >> 198-400 >> 200-305 >> 175-298 >> 204-237 >> 125, 159-300 >> 119, 281-320 >> 196, 218-266 >> n/c - 2h after lunch: 280-421 >> 149-199, 390 >> 228-261 >> 205-307 >> 210-252, 300 >> n/c - dinner: 165-294 >> 219--241 >> 147-220 >> 218-240 >> 94, 164-328 >> 122-242, 288 >> 142, 162-230, 297, 515 >> 140-260 - 2h after dinner: n/c >> 175-307 >> 237-349 >> 155-314 >> 228-260 >> 241-379 >> n/c - bedtime: 217-425 >> 163, 220-377 >> 208-340 >> 166-270 >> 207-225 >> 211-281 >> 185-254 >> 155, 201-290 >> 195-253 - nighttime: 88 (sweating), 187-443 >> 394 >> 190-240 >> n/c >> 180-203, 354 >> n/c  Pt's meals are - grazes! -  Breakfast: oatmeal or yoghurt or fruit bar/cup (largest meal) - Lunch: PB sandwich - Dinner: chicken leftovers - Snacks: sandwich, 1 pack of crackers, diet Mountain dew  - Mild CKD, last BUN/creatinine:  Lab Results  Component Value Date   BUN 11 01/16/2016   CREATININE 1.18 01/16/2016  Not on an ACEI. - last set of lipids: Lab Results  Component Value Date   CHOL 131 08/10/2015   HDL 29.30* 08/10/2015   LDLCALC 64 08/10/2015   LDLDIRECT 136.0 11/01/2014   TRIG 187.0* 08/10/2015   CHOLHDL 4 08/10/2015  On Crestor and Fenofibrate. - last eye exam was on 12/2015. No DR. - no numbness and tingling in his feet.  Hypogonadotropic hypogonadism: - He was on Androgel in 2015 >> level did not increase and he did not feel better - We checked his pituitary function >>  Labs normal except inappropriately normal LH, FSH , and low  Testosterone.  PRL higher, but Monomeric prolactin normal.   Component     Latest Ref Rng 05/16/2015  Testosterone     300 - 890 ng/dL 409 (L)  Sex Hormone Binding     10 - 50 nmol/L 28  Testosterone Free     47.0 - 244.0 pg/mL 26.9 (L)  Testosterone-% Free     1.6 - 2.9 % 2.0  IGF-I,  LC/MS     50 - 317 ng/mL 99  Z-Score (Male)     -2.0-+2.0 SD -0.7  Prolactin, Total     2.0 - 18.0 ng/mL 35.4 (H)  Prolactin, Monomeric     3.4 - 14.8 ng/mL 11.4  LH     1.50 - 9.30 mIU/mL 2.03  FSH     1.4 - 18.1 mIU/ML 5.9  Cortisol, Plasma      14.2  C206 ACTH     6 - 50 pg/mL 32   Previous labs (drawn at 8:33 AM)   Prolactin 38.6 (2.1-17.1)  FSH 5.6, LH 2.0  Total testosterone 134 (300-890), bioavailable testosterone 35.5 ng/dL (161.0-960.4130.5-681.7), free testosterone 18 pg/mL (47-244), SHBG 29 (10-50)  PSA 0.16  Estradiol 17.1 (0-39)  He had a pituitary MRI 07/14/2015) >>  No pituitary lesions.  We started Testosterone inj: 50 mg weekly >> feels good on this, no SEs.  Testosterone normalized: Component     Latest Ref Rng 07/18/2015 10/18/2015 10/18/2015  01/16/2016          7:52 AM  7:52 AM   Albumin     3.6 - 5.1 g/dL   4.5 4.4  Sex Horm Binding Glob, Serum     10 - 50 nmol/L 34 25 25 28   Testosterone Free     47.0 - 244.0 pg/mL 23.9 (L) 259.1 (H) 186.3 185.8  Testosterone, Bioavailable     130.5 - 681.7 ng/dL   540.9383.1 811.9374.1  Testosterone-% Free     1.6 - 2.9 % 1.8 2.7     Component     Latest Ref Rng 10/18/2015  WBC     4.0 - 10.5 K/uL 10.9 (H)  Hemoglobin     13.0 - 17.0 g/dL 14.715.0   Hyperprolactinemia: - found during investigation for low testosterone, by Dr. Mena GoesEskridge, his urologist. -  Turned out to be caused by macroprolactin, which does not require further investigation  Pt does c/o fatigue, low libido and problems with erections, but no gynecomastia or galactorrhea.   I reviewed pt's medications, allergies, PMH, social hx, family hx, and changes were documented in the history of present illness. Otherwise, unchanged from my initial visit note.  ROS: Constitutional: no weight gain/loss,no fatigue, no subjective hyperthermia/hypothermia Eyes: no blurry vision, no xerophthalmia ENT: no sore throat, no nodules palpated in throat, no dysphagia/odynophagia, no hoarseness Cardiovascular: no CP/SOB/palpitations/leg swelling Respiratory: no cough/SOB Gastrointestinal: no N/V/D/C Musculoskeletal: no muscle/joint aches Skin: no rashes Neurological: no tremors/numbness/tingling/dizziness  PE: BP 128/80 mmHg  Pulse 81  Ht 5\' 9"  (1.753 m)  Wt 218 lb (98.884 kg)  BMI 32.18 kg/m2  SpO2 98% Body mass index is 32.18 kg/(m^2).  Wt Readings from Last 3 Encounters:  01/19/16 218 lb (98.884 kg)  10/11/15 227 lb 9.6 oz (103.239 kg)  08/10/15 218 lb (98.884 kg)  Constitutional: overweight, in NAD Eyes: PERRLA, EOMI, no exophthalmos ENT: moist mucous membranes, no thyromegaly, no cervical lymphadenopathy Cardiovascular: RRR, No MRG Respiratory: CTA B Gastrointestinal: abdomen soft, NT, ND, BS+ Musculoskeletal: no deformities,  strength intact in all 4 Skin: moist, warm, no rashes Neurological: no tremor with outstretched hands, DTR normal in all 4  ASSESSMENT: 1. DM2, insulin-dependent, uncontrolled, with complications - mild ckd  2. Hypogonadotropic hypogonadism  PLAN:  1. Patient with h/o uncontrolled diabetes, with increased insulin resistance. Sugars improved on the U500 insulin and now on Tanzeum 50 mg weekly and Farxiga despite stopping insulin completely 2/2 lows (80-100 >> excessive sweating) - We discussed in  the past about a VGO system or another type of pump >> but he works outside and sweats a lot >> he does not think he can wear this - will restart Levemir. - I suggested to:  Patient Instructions  Please restart: - Levemir 20 units at bedtime.  In 4 days, if sugars in am are still >150, increase to 25 units. In 4 days, if sugars in am are still >150, increase to 30 units.  Please continue: - Metformin 1000 mg bid - Tanzeum 50 mg weekly - Farxiga 10 mg daily in am   Continue: - Testosterone injection 50 mg weekly.  Please return in 1.5 months with your sugar log.    - continue checking sugars at different times of the day - check 3 times a day, rotating checks - advised for yearly eye exams - he is UTD - check HbA1c today >> 8.4% (higher) - Return to clinic in 3 mo with sugar log.   2. Hypogonadotropic Hypogonadism - Pituitary MRI normal  - reviewed rest of the pituitary tests >> normal - now on Testosterone inj weekly after this (50 mg) - latest testosterone level normal few days ago - Again advised to ger annual DREs per PCP or urologist.

## 2016-02-09 ENCOUNTER — Other Ambulatory Visit: Payer: Self-pay | Admitting: Family Medicine

## 2016-02-09 ENCOUNTER — Other Ambulatory Visit: Payer: Self-pay | Admitting: Internal Medicine

## 2016-02-10 NOTE — Telephone Encounter (Signed)
Testosterone last filled 01-11-16 #2210ml Last OV 01-19-16 Next OV 03-26-16

## 2016-02-17 ENCOUNTER — Other Ambulatory Visit: Payer: Self-pay

## 2016-02-17 MED ORDER — ALBIGLUTIDE 50 MG ~~LOC~~ PEN: PEN_INJECTOR | SUBCUTANEOUS | Status: DC

## 2016-02-17 NOTE — Telephone Encounter (Signed)
Faxed Rx for tanzeum medication sent into pharmacy.

## 2016-03-01 ENCOUNTER — Other Ambulatory Visit: Payer: Self-pay | Admitting: Family Medicine

## 2016-03-01 ENCOUNTER — Other Ambulatory Visit (INDEPENDENT_AMBULATORY_CARE_PROVIDER_SITE_OTHER): Payer: 59

## 2016-03-01 DIAGNOSIS — E785 Hyperlipidemia, unspecified: Secondary | ICD-10-CM

## 2016-03-01 DIAGNOSIS — IMO0002 Reserved for concepts with insufficient information to code with codable children: Secondary | ICD-10-CM

## 2016-03-01 DIAGNOSIS — E039 Hypothyroidism, unspecified: Secondary | ICD-10-CM

## 2016-03-01 DIAGNOSIS — E1121 Type 2 diabetes mellitus with diabetic nephropathy: Secondary | ICD-10-CM | POA: Diagnosis not present

## 2016-03-01 DIAGNOSIS — E1165 Type 2 diabetes mellitus with hyperglycemia: Secondary | ICD-10-CM | POA: Diagnosis not present

## 2016-03-01 DIAGNOSIS — Z0000 Encounter for general adult medical examination without abnormal findings: Secondary | ICD-10-CM

## 2016-03-01 LAB — PSA: PSA: 0.69 ng/mL (ref 0.10–4.00)

## 2016-03-01 LAB — COMPREHENSIVE METABOLIC PANEL
ALBUMIN: 4.8 g/dL (ref 3.5–5.2)
ALK PHOS: 32 U/L — AB (ref 39–117)
ALT: 24 U/L (ref 0–53)
AST: 18 U/L (ref 0–37)
BILIRUBIN TOTAL: 1.1 mg/dL (ref 0.2–1.2)
BUN: 16 mg/dL (ref 6–23)
CO2: 34 mEq/L — ABNORMAL HIGH (ref 19–32)
Calcium: 10.3 mg/dL (ref 8.4–10.5)
Chloride: 100 mEq/L (ref 96–112)
Creatinine, Ser: 1.18 mg/dL (ref 0.40–1.50)
GFR: 68.94 mL/min (ref >60.00)
GLUCOSE: 155 mg/dL — AB (ref 70–99)
Potassium: 5 mEq/L (ref 3.5–5.1)
Sodium: 141 mEq/L (ref 135–145)
TOTAL PROTEIN: 7 g/dL (ref 6.0–8.3)

## 2016-03-01 LAB — TSH: TSH: 1.9 u[IU]/mL (ref 0.35–4.50)

## 2016-03-01 LAB — LIPID PANEL
CHOLESTEROL: 133 mg/dL (ref 0–200)
HDL: 33.1 mg/dL — AB (ref >39.00)
LDL Cholesterol: 64 mg/dL (ref 0–99)
NONHDL: 99.85
Total CHOL/HDL Ratio: 4
Triglycerides: 178 mg/dL — ABNORMAL HIGH (ref 0.0–149.0)
VLDL: 35.6 mg/dL (ref 0.0–40.0)

## 2016-03-01 LAB — HEMOGLOBIN A1C: HEMOGLOBIN A1C: 8.4 % — AB (ref 4.6–6.5)

## 2016-03-13 ENCOUNTER — Encounter: Payer: Self-pay | Admitting: Gastroenterology

## 2016-03-13 ENCOUNTER — Ambulatory Visit (INDEPENDENT_AMBULATORY_CARE_PROVIDER_SITE_OTHER): Payer: 59 | Admitting: Family Medicine

## 2016-03-13 ENCOUNTER — Encounter: Payer: Self-pay | Admitting: Family Medicine

## 2016-03-13 VITALS — BP 124/76 | HR 75 | Temp 98.5°F | Ht 69.0 in | Wt 211.5 lb

## 2016-03-13 DIAGNOSIS — E039 Hypothyroidism, unspecified: Secondary | ICD-10-CM

## 2016-03-13 DIAGNOSIS — F1120 Opioid dependence, uncomplicated: Secondary | ICD-10-CM

## 2016-03-13 DIAGNOSIS — F524 Premature ejaculation: Secondary | ICD-10-CM | POA: Diagnosis not present

## 2016-03-13 DIAGNOSIS — Z0001 Encounter for general adult medical examination with abnormal findings: Secondary | ICD-10-CM

## 2016-03-13 DIAGNOSIS — E1121 Type 2 diabetes mellitus with diabetic nephropathy: Secondary | ICD-10-CM

## 2016-03-13 DIAGNOSIS — N529 Male erectile dysfunction, unspecified: Secondary | ICD-10-CM | POA: Insufficient documentation

## 2016-03-13 DIAGNOSIS — E1165 Type 2 diabetes mellitus with hyperglycemia: Secondary | ICD-10-CM

## 2016-03-13 DIAGNOSIS — E7800 Pure hypercholesterolemia, unspecified: Secondary | ICD-10-CM | POA: Diagnosis not present

## 2016-03-13 DIAGNOSIS — E785 Hyperlipidemia, unspecified: Secondary | ICD-10-CM

## 2016-03-13 DIAGNOSIS — N521 Erectile dysfunction due to diseases classified elsewhere: Secondary | ICD-10-CM | POA: Diagnosis not present

## 2016-03-13 DIAGNOSIS — IMO0002 Reserved for concepts with insufficient information to code with codable children: Secondary | ICD-10-CM

## 2016-03-13 DIAGNOSIS — Z1211 Encounter for screening for malignant neoplasm of colon: Secondary | ICD-10-CM

## 2016-03-13 DIAGNOSIS — G8929 Other chronic pain: Secondary | ICD-10-CM

## 2016-03-13 DIAGNOSIS — Z0000 Encounter for general adult medical examination without abnormal findings: Secondary | ICD-10-CM

## 2016-03-13 MED ORDER — SILDENAFIL CITRATE 100 MG PO TABS: 50.0000 mg | ORAL_TABLET | Freq: Every day | ORAL | 11 refills | Status: DC | PRN

## 2016-03-13 MED ORDER — PAROXETINE HCL 20 MG PO TABS: 20.0000 mg | ORAL_TABLET | Freq: Every day | ORAL | 3 refills | Status: DC

## 2016-03-13 NOTE — Assessment & Plan Note (Addendum)
Improving but not quite at goal yet. Followed closely but endocrinology. Declines pneumovax. Foot exam and urine micro today.

## 2016-03-13 NOTE — Progress Notes (Signed)
50 yo pleasant male with h/o depression, chronic pain, hypothyroidism, DM, HLD, hyperprolactinemia, low T, here for CPX and follow up of chronic medical conditions.  H/o narcotic dependence.   On methadone, seen at [facility].  Premature ejaculation- started a few months ago.  Now also having difficulty maintaining an erection over the past month and symptoms of premature ejaculation unfortunately becoming worse.  DM- now managed by endocrinology, Dr. Jasmine December.  Was last seen by Dr. Cruzita Lederer on 16/22/17.  Note reviewed. Restarted Levemir, advised to continue Metformin 1000 mg twice daily, Tanzeum 50 mg weekly and Farxiga 10 mg every morning.  Lab Results  Component Value Date   HGBA1C 8.4 (H) 03/01/2016   Also receiving testosterone injections through endocrinology. Lab Results  Component Value Date   TESTOSTERONE 1,007 (H) 01/16/2016    Wt Readings from Last 3 Encounters:  03/13/16 211 lb 8 oz (95.9 kg)  01/19/16 218 lb (98.9 kg)  10/11/15 227 lb 9.6 oz (103.2 kg)   Lab Results  Component Value Date   PSA 0.69 03/01/2016   PSA 0.91 10/18/2015   PSA 0.37 01/17/2010      HLD- now at goal for a diabetic,   On crestor.  Lab Results  Component Value Date   CHOL 133 03/01/2016   HDL 33.10 (L) 03/01/2016   LDLCALC 64 03/01/2016   LDLDIRECT 136.0 11/01/2014   TRIG 178.0 (H) 03/01/2016   CHOLHDL 4 03/01/2016    Wt Readings from Last 3 Encounters:  03/13/16 211 lb 8 oz (95.9 kg)  01/19/16 218 lb (98.9 kg)  10/11/15 227 lb 9.6 oz (103.2 kg)    Depression- stable off rx.  Patient Active Problem List  Diagnosis  . HERPES LABIALIS  . Hypothyroidism  . THYROID STIMULATING HORMONE, ABNORMAL  . PURE HYPERCHOLESTEROLEMIA  . HLD (hyperlipidemia)  . GOUT  . TOBACCO ABUSE  . INSOMNIA  . FATIGUE  . GENERALIZED ANXIETY DISORDER  . Narcotic dependence ([place])  . Chronic pain  . Depression  . Anxiety  . Hypersomnia  . Family history of premature CAD  . DOE (dyspnea on  exertion)  . Uncontrolled type 2 diabetes mellitus with nephropathy ([place])  . Elevated liver function tests  . Hypogonadotropic hypogonadism ([place])  . Hyperprolactinemia ([place])  . Visit for well man health check  . Erectile dysfunction  . Premature ejaculation   Past Medical History:  Diagnosis Date  . Anxiety   . Borderline diabetes   . Chronic pain   . Depression   . Hyperlipidemia   . Narcotic dependence ([place])   . Sleep apnea    Past Surgical History:  Procedure Laterality Date  . removal of bone spur     Social History  Substance Use Topics  . Smoking status: Never Smoker  . Smokeless tobacco: Current User    Types: Snuff  . Alcohol use Not on file   Family History  Problem Relation Age of Onset  . Heart disease Father   . Cancer Father     unsure what kind   Allergies  Allergen Reactions  . Gemfibrozil     REACTION: diarrhea   Current Outpatient Prescriptions on File Prior to Visit  Medication Sig Dispense Refill  . Albiglutide (TANZEUM) 50 MG PEN INJECT 1 SYRINGEGUL ('50MG'$ ) UNDER SKIN ONCE A WEEK 4 each 2  . aspirin 81 MG tablet Take 81 mg by mouth daily.      . B-D INS SYRINGE 0.5CC/31GX5/16 31G X 5/16" 0.5 ML MISC USE 3 TIMES  DAILY 100 each 5  . Blood Glucose Monitoring Suppl (ONE TOUCH ULTRA MINI) W/DEVICE KIT Use as directed to test blood sugar twice daily 250.00 1 each 0  . FARXIGA 10 MG TABS tablet TAKE 1 TABLET BY MOUTH NIGHTLY 30 tablet 2  . fenofibrate 160 MG tablet TAKE 1 TABLET (160 MG TOTAL) BY MOUTH DAILY. 90 tablet 1  . fluticasone (FLONASE) 50 MCG/ACT nasal spray PLACE 2 SPRAYS INTO BOTH NOSTRILS DAILY. 16 g 3  . Insulin Detemir (LEVEMIR FLEXPEN) 100 UNIT/ML Pen Inject 30 Units into the skin every morning. 15 mL 2  . Insulin Pen Needle 32G X 4 MM MISC Use to inject insulin 1 time daily as instructed. 200 each 2  . LORazepam (ATIVAN) 0.5 MG tablet Take 2 tablets (1 mg total) by mouth once. Before MRI 4 tablet 0  . metFORMIN (GLUCOPHAGE) 1000 MG  tablet TAKE 1 TABLET (1,000 MG TOTAL) BY MOUTH 2 (TWO) TIMES DAILY WITH A MEAL. 180 tablet 0  . methadone (DOLOPHINE) 10 MG/ML solution Take 180 mg by mouth daily.     . Multiple Vitamin (MULTIVITAMIN) tablet Take 2 tablets by mouth 2 (two) times daily.    . ONE TOUCH ULTRA TEST test strip USE 8 TIMES A DAY AS INSTRUCTED 300 each 5  . ONETOUCH DELICA LANCETS 37D MISC USE TO TEST BLOOD SUGAR 4 TIMES DAILY AS INSTRUCTED. 200 each 4  . SYRINGE-NEEDLE, DISP, 3 ML 18G X 1-1/2" 3 ML MISC Use to inject testosterone. 100 each 1  . testosterone cypionate (DEPOTESTOSTERONE CYPIONATE) 200 MG/ML injection INJECT 0.25 MILLILITERS INTRAMUSCULARLY ONCE PER WEEK 10 mL 2  . valACYclovir (VALTREX) 500 MG tablet Take 500 mg by mouth 2 (two) times daily as needed. For cold sore flare ups Takes twice daily until cold sore clears up    . vitamin B-12 (CYANOCOBALAMIN) 1000 MCG tablet Take 1,000 mcg by mouth daily.     No current facility-administered medications on file prior to visit.      The PMH, PSH, Social History, Family History, Medications, and allergies have been reviewed in [facility], and have been updated if relevant.   Review of Systems  Constitutional: Negative.   HENT: Negative.   Respiratory: Negative.   Cardiovascular: Negative.   Gastrointestinal: Negative.   Endocrine: Negative.   Genitourinary:       + erectile dysfunction + premature ejaculation  Musculoskeletal: Negative.   Skin: Negative.   Allergic/Immunologic: Negative.   Neurological: Negative.   Hematological: Negative.   Psychiatric/Behavioral: Negative.   All other systems reviewed and are negative.   BP 124/76   Pulse 75   Temp 98.5 F (36.9 C) (Oral)   Ht '5\' 9"'$  (1.753 m)   Wt 211 lb 8 oz (95.9 kg)   SpO2 97%   BMI 31.23 kg/m    Physical Exam  Constitutional: He is oriented to person, place, and time and well-developed, well-nourished, and in no distress. No distress.  HENT:  Head: Normocephalic.  Eyes:  Conjunctivae are normal.  Cardiovascular: Normal rate and regular rhythm.   Pulmonary/Chest: Effort normal and breath sounds normal.  Musculoskeletal: He exhibits no edema.  Neurological: He is alert and oriented to person, place, and time.  Skin: Skin is warm and dry. He is not diaphoretic.  Psychiatric: Mood, memory, affect and judgment normal.  Nursing note and vitals reviewed.

## 2016-03-13 NOTE — Assessment & Plan Note (Signed)
Reviewed preventive care protocols, scheduled due services, and updated immunizations Discussed nutrition, exercise, diet, and healthy lifestyle.  Agrees to GI referral for colonoscopy- referral placed.

## 2016-03-13 NOTE — Assessment & Plan Note (Signed)
At goal for a diabetic. Continue current dose of crestor.

## 2016-03-13 NOTE — Assessment & Plan Note (Signed)
Followed by methadone clinic.

## 2016-03-13 NOTE — Assessment & Plan Note (Signed)
New- discussed tx options. He would like a trial of an SSRI- eRx sent for paxil. Call or return to clinic prn if these symptoms worsen or fail to improve as anticipated. The patient indicates understanding of these issues and agrees with the plan.

## 2016-03-13 NOTE — Assessment & Plan Note (Signed)
New- discussed tx options. He would like a trial of viagra- discussed risks and side effects. eRx sent for viagra. Call or return to clinic prn if these symptoms worsen or fail to improve as anticipated. The patient indicates understanding of these issues and agrees with the plan.

## 2016-03-13 NOTE — Progress Notes (Signed)
Pre visit review using our clinic review tool, if applicable. No additional management support is needed unless otherwise documented below in the visit note. 

## 2016-03-13 NOTE — Patient Instructions (Signed)
Great to see you. We will call you with your GI appointment (colonoscopy).  We are starting Paxil 20 mg daily- ok to take 1/2 (10 mg daily) for a few days prior to increasing to full dose.  Viagra as needed.  Please update me.

## 2016-03-14 NOTE — Addendum Note (Signed)
Addended by: Alvina ChouWALSH, TERRI J on: 03/14/2016 05:42 PM   Modules accepted: Orders

## 2016-03-26 ENCOUNTER — Encounter: Payer: Self-pay | Admitting: Internal Medicine

## 2016-03-26 ENCOUNTER — Ambulatory Visit (INDEPENDENT_AMBULATORY_CARE_PROVIDER_SITE_OTHER): Payer: 59 | Admitting: Internal Medicine

## 2016-03-26 VITALS — BP 138/82 | HR 85 | Ht 69.0 in | Wt 210.0 lb

## 2016-03-26 DIAGNOSIS — E1121 Type 2 diabetes mellitus with diabetic nephropathy: Secondary | ICD-10-CM | POA: Diagnosis not present

## 2016-03-26 DIAGNOSIS — E230 Hypopituitarism: Secondary | ICD-10-CM

## 2016-03-26 DIAGNOSIS — IMO0002 Reserved for concepts with insufficient information to code with codable children: Secondary | ICD-10-CM

## 2016-03-26 DIAGNOSIS — E1165 Type 2 diabetes mellitus with hyperglycemia: Secondary | ICD-10-CM | POA: Diagnosis not present

## 2016-03-26 MED ORDER — BASAGLAR KWIKPEN 100 UNIT/ML ~~LOC~~ SOPN: 15.0000 [IU] | PEN_INJECTOR | Freq: Every day | SUBCUTANEOUS | 2 refills | Status: DC

## 2016-03-26 NOTE — Progress Notes (Signed)
Patient ID: Martin Downs, male   DOB: 03/19/1966, 50 y.o.   MRN: [id number]  HPI: Martin Downs is a 50 y.o.-year-old male, returning for f/u for DM2 dx 2014, insulin-dependent since 02/2014, uncontrolled, with complications (mild CKD). Also,  Hypogonadotropic hypogonadism. Last visit 3 mo ago.  DM2: Last hemoglobin A1c was: Lab Results  Component Value Date   HGBA1C 8.4 (H) 03/01/2016   HGBA1C 8.4 01/19/2016   HGBA1C 8.1 10/11/2015   Pt was on a regimen of: - Metformin 1000 mg bid - Levemir 40 units 2x a day  - NovoLog 15 min before a meal:  - 30 units before a smaller meal - 35 units before a larger meal    Novolog 15 units for a snack. We stopped Actoplus 15-850 mg in 02/2014. We stopped Glipizide when we increased Novolog. Insurance does not cover Invokana.  He takes: not taking as it drops his sugars too low - Metformin 1000 mg bid - Tanzeum 50 mg weekly - Farxiga 10 mg daily in am (added 09/2015)  restarted at last visit >> had to stop b/c high CBGs (400s)   Pt checks sugars 2-3x a day - get's widely different values depending on which finger he checks: - am: 208-380 >> 196-222 >> 69, 100, 204-340 >> 100, 104-304 >> 168-248 >> 117-240, 325 >> 138-289 - 2h after b'fast:284-365, 441, 481 >> 210-419 >> 166-395 >> 216-244 >> 166-300, 380 >> n/c >> 114 >> n/c - lunch: 200-305 >> 175-298 >> 204-237 >> 125, 159-300 >> 119, 281-320 >> 196, 218-266 >> n/c >> 115-191 - 2h after lunch: 280-421 >> 149-199, 390 >> 228-261 >> 205-307 >> 210-252, 300 >> n/c  - dinner: 147-220 >> 218-240 >> 94, 164-328 >> 122-242, 288 >> 142, 162-230, 297, 515 >> 140-260 >> 99, 189 - 2h after dinner: n/c >> 175-307 >> 237-349 >> 155-314 >> 228-260 >> 241-379 >> n/c - bedtime: 208-340 >> 166-270 >> 207-225 >> 211-281 >> 185-254 >> 155, 201-290 >> 195-253 >> 110-212 - nighttime: 88 (sweating), 187-443 >> 394 >> 190-240 >> n/c >> 180-203, 354 >> n/c >> 130-218  Meter: One Touch Ultra mini  Pt's  meals are - grazes! - Breakfast: oatmeal or yoghurt or fruit bar/cup (largest meal) - Lunch: PB sandwich - Dinner: chicken leftovers - Snacks: sandwich, 1 pack of crackers, diet Mountain dew  - Mild CKD, last BUN/creatinine:  Lab Results  Component Value Date   BUN 16 03/01/2016   CREATININE 1.18 03/01/2016  Not on an ACEI. - last set of lipids: Lab Results  Component Value Date   CHOL 133 03/01/2016   HDL 33.10 (L) 03/01/2016   LDLCALC 64 03/01/2016   LDLDIRECT 136.0 11/01/2014   TRIG 178.0 (H) 03/01/2016   CHOLHDL 4 03/01/2016  On Crestor and Fenofibrate. - last eye exam was on 12/2015. No DR. - no numbness and tingling in his feet.  Hypogonadotropic hypogonadism: - He was on Androgel in 2015 >> level did not increase and he did not feel better >> now on im testosterone 50 mg weekly  Reviewed levels - latest free T level normal: Component     Latest Ref Rng & Units 07/18/2015 10/18/2015 10/18/2015 01/16/2016          7:52 AM  7:52 AM   RBC     4.22 - 5.81 Mil/uL  5.08    Platelets     150.0 - 400.0 K/uL  266.0    Hemoglobin     13.0 -  17.0 g/dL  16.1    HCT     09.6 - 52.0 %  45.6    Testosterone     250 - 827 ng/dL 045 (L) 409 (H) 811 (H) 1,007 (H)  Albumin     3.6 - 5.1 g/dL   4.5 4.4  Sex Horm Binding Glob, Serum     10 - 50 nmol/L 34 25 25 28   Testosterone Free     47.0 - 244.0 pg/mL 23.9 (L) 259.1 (H) 186.3 185.8  Testosterone, Bioavailable     130.5 - 681.7 ng/dL   914.7 829.5  Testosterone-% Free     1.6 - 2.9 % 1.8 2.7    PSA     0.10 - 4.00 ng/mL  0.91     Component     Latest Ref Rng & Units 03/01/2016  PSA     0.10 - 4.00 ng/mL 0.69   - We checked his pituitary function >>  Labs normal except inappropriately normal LH, FSH , and low  Testosterone.  PRL higher, but Monomeric prolactin normal.   Component     Latest Ref Rng 05/16/2015  Testosterone     300 - 890 ng/dL 621 (L)  Sex Hormone Binding     10 - 50 nmol/L 28  Testosterone Free      47.0 - 244.0 pg/mL 26.9 (L)  Testosterone-% Free     1.6 - 2.9 % 2.0  IGF-I, LC/MS     50 - 317 ng/mL 99  Z-Score (Male)     -2.0-+2.0 SD -0.7  Prolactin, Total     2.0 - 18.0 ng/mL 35.4 (H)  Prolactin, Monomeric     3.4 - 14.8 ng/mL 11.4  LH     1.50 - 9.30 mIU/mL 2.03  FSH     1.4 - 18.1 mIU/ML 5.9  Cortisol, Plasma      14.2  C206 ACTH     6 - 50 pg/mL 32   Previous labs (drawn at 8:33 AM)   Prolactin 38.6 (2.1-17.1)  FSH 5.6, LH 2.0  Total testosterone 134 (300-890), bioavailable testosterone 35.5 ng/dL (308.6-578.4), free testosterone 18 pg/mL (47-244), SHBG 29 (10-50)  PSA 0.16  Estradiol 17.1 (0-39)  He had a pituitary MRI 07/14/2015) >>  No pituitary lesions.  Hyperprolactinemia: - found during investigation for low testosterone, by Dr. Mena Goes, his urologist. -  Turned out to be caused by macroprolactin, which does not require further investigation  Pt does c/o fatigue, low libido and problems with erections, but no gynecomastia or galactorrhea.   I reviewed pt's medications, allergies, PMH, social hx, family hx, and changes were documented in the history of present illness. Otherwise, unchanged from my initial visit note. He started Paxil, Viagra.  ROS: Constitutional: no weight gain/loss,no fatigue, + subjective hyperthermia Eyes: no blurry vision, no xerophthalmia ENT: no sore throat, no nodules palpated in throat, no dysphagia/odynophagia, no hoarseness Cardiovascular: no CP/SOB/palpitations/leg swelling Respiratory: no cough/SOB Gastrointestinal: + N/no V/D/C Musculoskeletal: no muscle/joint aches Skin: no rashes Neurological: no tremors/numbness/tingling/dizziness  PE: BP 138/82 (BP Location: Left Arm, Patient Position: Sitting)   Pulse 85   Ht 5\' 9"  (1.753 m)   Wt 210 lb (95.3 kg)   SpO2 97%   BMI 31.01 kg/m  Body mass index is 31.01 kg/m.  Wt Readings from Last 3 Encounters:  03/26/16 210 lb (95.3 kg)  03/13/16 211 lb 8 oz (95.9 kg)   01/19/16 218 lb (98.9 kg)  Constitutional: overweight, in NAD Eyes: PERRLA, EOMI,  no exophthalmos ENT: moist mucous membranes, no thyromegaly, no cervical lymphadenopathy Cardiovascular: RRR, No MRG Respiratory: CTA B Gastrointestinal: abdomen soft, NT, ND, BS+ Musculoskeletal: no deformities, strength intact in all 4 Skin: moist, warm, no rashes Neurological: no tremor with outstretched hands, DTR normal in all 4  ASSESSMENT: 1. DM2, insulin-dependent, uncontrolled, with complications - mild ckd  2. Hypogonadotropic hypogonadism  PLAN:  1. Patient with h/o uncontrolled diabetes, with increased insulin resistance. Sugars were worse after starting Levemir (!) >> he stopped. Sugars now better, but still >target.  Will try Basaglar. - We discussed in the past about a VGO system or another type of pump >> but he works outside and sweats a lot >> he does not think he can wear this  - latest HbA1c 8.4% - I suggested to:  Patient Instructions  Please start: - Basaglar 15 units at bedtime.  In 4 days, if sugars in am are still >150, increase to 20 units. In 4 days, if sugars in am are still >150, increase to 25 units.  Please continue: - Metformin 1000 mg 2x daily - Tanzeum 50 mg weekly - Farxiga 10 mg daily in am   Continue: - Testosterone injection 50 mg weekly.  Please return in 3 months with your sugar log.   - continue checking sugars at different times of the day - check 3 times a day, rotating checks - advised for yearly eye exams - he is UTD - Return to clinic in 3 mo with sugar log.   2. Hypogonadotropic Hypogonadism - Pituitary MRI normal  - reviewed rest of the pituitary tests >> normal - now on Testosterone inj weekly (50 mg) - latest testosterone level normal, as was his PSA and Hb. - received report of DRE from urologist. He does have BPH.  Carlus Pavlovristina Samreet Edenfield, MD PhD [facility]

## 2016-03-26 NOTE — Patient Instructions (Addendum)
Please start: - Basaglar 15 units at bedtime.  In 4 days, if sugars in am are still >150, increase to 20 units. In 4 days, if sugars in am are still >150, increase to 25 units.  Please continue: - Metformin 1000 mg 2x daily - Tanzeum 50 mg weekly - Farxiga 10 mg daily in am   Continue: - Testosterone injection 50 mg weekly.  Please return in 3 months with your sugar log.

## 2016-04-11 ENCOUNTER — Ambulatory Visit: Payer: 59 | Admitting: Family Medicine

## 2016-04-12 ENCOUNTER — Telehealth: Payer: Self-pay

## 2016-04-12 ENCOUNTER — Encounter: Payer: Self-pay | Admitting: Family Medicine

## 2016-04-12 ENCOUNTER — Ambulatory Visit (INDEPENDENT_AMBULATORY_CARE_PROVIDER_SITE_OTHER): Payer: 59 | Admitting: Family Medicine

## 2016-04-12 VITALS — BP 146/92 | HR 78 | Temp 98.4°F | Wt 213.2 lb

## 2016-04-12 DIAGNOSIS — N521 Erectile dysfunction due to diseases classified elsewhere: Secondary | ICD-10-CM | POA: Diagnosis not present

## 2016-04-12 DIAGNOSIS — F524 Premature ejaculation: Secondary | ICD-10-CM | POA: Diagnosis not present

## 2016-04-12 MED ORDER — PAROXETINE HCL 30 MG PO TABS: 30.0000 mg | ORAL_TABLET | Freq: Every day | ORAL | 3 refills | Status: DC

## 2016-04-12 NOTE — Assessment & Plan Note (Signed)
>  25 minutes spent in face to face time with patient, >50% spent in counselling or coordination of care discussing premature ejaculation and erectile dysfunction. Paxil helped a little with premature ejaculation.  We discussed tx options including increasing dose of paxil, trial of different SSRI (like celexa), or allowing his urologist to manage. He would like trial of increased dose of paxil. eRx sent for paxil 30 mg daily. The patient indicates understanding of these issues and agrees with the plan.

## 2016-04-12 NOTE — Progress Notes (Signed)
Pre visit review using our clinic review tool, if applicable. No additional management support is needed unless otherwise documented below in the visit note. 

## 2016-04-12 NOTE — Telephone Encounter (Signed)
Pt left v/m; pt was seen earlier today and pt was given viagra; pt said if takes Cialis one pill daily then pt would be ready all day and wonders if that would be the better choice. Pt request cb.[facility].

## 2016-04-12 NOTE — Patient Instructions (Signed)
Great to see you. We are increasing your paxil to 30 mg daily.  Please keep me updated.

## 2016-04-12 NOTE — Telephone Encounter (Signed)
I would continue generic viagra as cialis is likely more expensive.

## 2016-04-12 NOTE — Progress Notes (Signed)
Subjective:   Patient ID: Martin Downs, male    DOB: 03-Feb-1966, 50 y.o.   MRN: [id number]  Martin Downs is a pleasant 50 y.o. year old male who presents to clinic today with Follow-up (discuss meds) and Premature Ejaculation  on 04/12/2016  HPI:  When I saw him last month, he complained of premature ejaculation that had started a few months prior.  Also having difficulty maintaining an erection.  Trial of viagra and daily SSRI (paxil).  Here is here to follow up this medications today.  Viagra is helping but he feels paxil is only helping a little with the premature ejaculation.  Current Outpatient Prescriptions on File Prior to Visit  Medication Sig Dispense Refill  . Albiglutide (TANZEUM) 50 MG PEN INJECT 1 SYRINGEGUL ('50MG'$ ) UNDER SKIN ONCE A WEEK 4 each 2  . aspirin 81 MG tablet Take 81 mg by mouth daily.      . B-D INS SYRINGE 0.5CC/31GX5/16 31G X 5/16" 0.5 ML MISC USE 3 TIMES DAILY 100 each 5  . Blood Glucose Monitoring Suppl (ONE TOUCH ULTRA MINI) W/DEVICE KIT Use as directed to test blood sugar twice daily 250.00 1 each 0  . FARXIGA 10 MG TABS tablet TAKE 1 TABLET BY MOUTH NIGHTLY 30 tablet 2  . fenofibrate 160 MG tablet TAKE 1 TABLET (160 MG TOTAL) BY MOUTH DAILY. 90 tablet 1  . fluticasone (FLONASE) 50 MCG/ACT nasal spray PLACE 2 SPRAYS INTO BOTH NOSTRILS DAILY. 16 g 3  . Insulin Detemir (LEVEMIR FLEXPEN) 100 UNIT/ML Pen Inject 30 Units into the skin every morning. 15 mL 2  . Insulin Glargine (BASAGLAR KWIKPEN) 100 UNIT/ML SOPN Inject 0.15 mLs (15 Units total) into the skin at bedtime. 5 pen 2  . Insulin Pen Needle 32G X 4 MM MISC Use to inject insulin 1 time daily as instructed. 200 each 2  . LORazepam (ATIVAN) 0.5 MG tablet Take 2 tablets (1 mg total) by mouth once. Before MRI 4 tablet 0  . metFORMIN (GLUCOPHAGE) 1000 MG tablet TAKE 1 TABLET (1,000 MG TOTAL) BY MOUTH 2 (TWO) TIMES DAILY WITH A MEAL. 180 tablet 0  . methadone (DOLOPHINE) 10 MG/ML solution Take  180 mg by mouth daily.     . Multiple Vitamin (MULTIVITAMIN) tablet Take 2 tablets by mouth 2 (two) times daily.    . ONE TOUCH ULTRA TEST test strip USE 8 TIMES A DAY AS INSTRUCTED 300 each 5  . ONETOUCH DELICA LANCETS 64P MISC USE TO TEST BLOOD SUGAR 4 TIMES DAILY AS INSTRUCTED. 200 each 4  . rosuvastatin (CRESTOR) 10 MG tablet     . sildenafil (VIAGRA) 100 MG tablet Take 0.5-1 tablets (50-100 mg total) by mouth daily as needed for erectile dysfunction. 5 tablet 11  . SYRINGE-NEEDLE, DISP, 3 ML 18G X 1-1/2" 3 ML MISC Use to inject testosterone. 100 each 1  . testosterone cypionate (DEPOTESTOSTERONE CYPIONATE) 200 MG/ML injection INJECT 0.25 MILLILITERS INTRAMUSCULARLY ONCE PER WEEK 10 mL 2  . valACYclovir (VALTREX) 500 MG tablet Take 500 mg by mouth 2 (two) times daily as needed. For cold sore flare ups Takes twice daily until cold sore clears up    . vitamin B-12 (CYANOCOBALAMIN) 1000 MCG tablet Take 1,000 mcg by mouth daily.     No current facility-administered medications on file prior to visit.     Allergies  Allergen Reactions  . Gemfibrozil     REACTION: diarrhea    Past Medical History:  Diagnosis Date  . Anxiety   .  Borderline diabetes   . Chronic pain   . Depression   . Hyperlipidemia   . Narcotic dependence ([place])   . Sleep apnea     Past Surgical History:  Procedure Laterality Date  . removal of bone spur      Family History  Problem Relation Age of Onset  . Heart disease Father   . Cancer Father     unsure what kind    Social History   Social History  . Marital status: Married    Spouse name: N/A  . Number of children: Y  . Years of education: N/A   Occupational History  . Dealer Unemployed   Social History Main Topics  . Smoking status: Never Smoker  . Smokeless tobacco: Current User    Types: Snuff  . Alcohol use Not on file  . Drug use: Unknown  . Sexual activity: Not on file   Other Topics Concern  . Not on file   Social History  Narrative  . No narrative on file   The PMH, PSH, Social History, Family History, Medications, and allergies have been reviewed in [facility], and have been updated if relevant.   Review of Systems  Constitutional: Negative.   Genitourinary:       +sexual dysfunction       Objective:    BP (!) 146/92   Pulse 78   Temp 98.4 F (36.9 C) (Oral)   Wt 213 lb 4 oz (96.7 kg)   SpO2 97%   BMI 31.49 kg/m    Physical Exam  Constitutional: He is oriented to person, place, and time. He appears well-developed and well-nourished. No distress.  HENT:  Head: Normocephalic.  Eyes: Conjunctivae are normal.  Cardiovascular: Normal rate.   Pulmonary/Chest: Effort normal.  Musculoskeletal: Normal range of motion.  Neurological: He is alert and oriented to person, place, and time. No cranial nerve deficit.  Skin: Skin is warm and dry. He is not diaphoretic.  Psychiatric: He has a normal mood and affect. His behavior is normal. Judgment and thought content normal.  Nursing note and vitals reviewed.         Assessment & Plan:   Premature ejaculation  Erectile dysfunction due to diseases classified elsewhere No Follow-up on file.

## 2016-04-13 NOTE — Telephone Encounter (Signed)
Spoke to pt and advised per Dr Aron.  

## 2016-05-11 ENCOUNTER — Ambulatory Visit (AMBULATORY_SURGERY_CENTER): Payer: Self-pay | Admitting: *Deleted

## 2016-05-11 ENCOUNTER — Encounter: Payer: Self-pay | Admitting: Gastroenterology

## 2016-05-11 VITALS — Ht 69.0 in | Wt 222.6 lb

## 2016-05-11 DIAGNOSIS — Z86010 Personal history of colonic polyps: Secondary | ICD-10-CM

## 2016-05-11 MED ORDER — NA SULFATE-K SULFATE-MG SULF 17.5-3.13-1.6 GM/177ML PO SOLN
1.0000 | Freq: Once | ORAL | 0 refills | Status: AC
Start: 2016-05-11 — End: 2016-05-11

## 2016-05-11 NOTE — Progress Notes (Signed)
No allergies to eggs or soy. No problems with anesthesia.  Pt given Emmi instructions for colonoscopy  No oxygen use  No diet drug use  

## 2016-05-14 ENCOUNTER — Other Ambulatory Visit: Payer: Self-pay | Admitting: Internal Medicine

## 2016-05-25 ENCOUNTER — Encounter: Payer: Self-pay | Admitting: Gastroenterology

## 2016-05-25 ENCOUNTER — Ambulatory Visit (AMBULATORY_SURGERY_CENTER): Payer: 59 | Admitting: Gastroenterology

## 2016-05-25 VITALS — BP 117/73 | HR 82 | Temp 99.6°F | Resp 16 | Ht 69.0 in | Wt 222.0 lb

## 2016-05-25 DIAGNOSIS — Z86010 Personal history of colonic polyps: Secondary | ICD-10-CM

## 2016-05-25 LAB — GLUCOSE, CAPILLARY
GLUCOSE-CAPILLARY: 123 mg/dL — AB (ref 65–99)
Glucose-Capillary: 109 mg/dL — ABNORMAL HIGH (ref 65–99)

## 2016-05-25 MED ORDER — SODIUM CHLORIDE 0.9 % IV SOLN: 500.0000 mL | INTRAVENOUS | Status: DC

## 2016-05-25 NOTE — Patient Instructions (Signed)
YOU HAD AN ENDOSCOPIC PROCEDURE TODAY AT THE [facility]:   Refer to the procedure report that was given to you for any specific questions about what was found during the examination.  If the procedure report does not answer your questions, please call your gastroenterologist to clarify.  If you requested that your care partner not be given the details of your procedure findings, then the procedure report has been included in a sealed envelope for you to review at your convenience later.  YOU SHOULD EXPECT: Some feelings of bloating in the abdomen. Passage of more gas than usual.  Walking can help get rid of the air that was put into your GI tract during the procedure and reduce the bloating. If you had a lower endoscopy (such as a colonoscopy or flexible sigmoidoscopy) you may notice spotting of blood in your stool or on the toilet paper. If you underwent a bowel prep for your procedure, you may not have a normal bowel movement for a few days.  Please Note:  You might notice some irritation and congestion in your nose or some drainage.  This is from the oxygen used during your procedure.  There is no need for concern and it should clear up in a day or so.  SYMPTOMS TO REPORT IMMEDIATELY:   Following lower endoscopy (colonoscopy or flexible sigmoidoscopy):  Excessive amounts of blood in the stool  Significant tenderness or worsening of abdominal pains  Swelling of the abdomen that is new, acute  Fever of 100F or higher   For urgent or emergent issues, a gastroenterologist can be reached at any hour by calling [phone].   DIET:  We do recommend a small meal at first, but then you may proceed to your regular diet.  Drink plenty of fluids but you should avoid alcoholic beverages for 24 hours.  ACTIVITY:  You should plan to take it easy for the rest of today and you should NOT DRIVE or use heavy machinery until tomorrow (because of the sedation medicines used during the test).     FOLLOW UP: Our staff will call the number listed on your records the next business day following your procedure to check on you and address any questions or concerns that you may have regarding the information given to you following your procedure. If we do not reach you, we will leave a message.  However, if you are feeling well and you are not experiencing any problems, there is no need to return our call.  We will assume that you have returned to your regular daily activities without incident.  If any biopsies were taken you will be contacted by phone or by letter within the next 1-3 weeks.  Please call us at [phone] if you have not heard about the biopsies in 3 weeks.    SIGNATURES/CONFIDENTIALITY: You and/or your care partner have signed paperwork which will be entered into your electronic medical record.  These signatures attest to the fact that that the information above on your After Visit Summary has been reviewed and is understood.  Full responsibility of the confidentiality of this discharge information lies with you and/or your care-partner.  Dr Larae GroomsJacob's office staff will contact you regarding your next colonoscopy and double prep instructions.  Thank-you for choosing us for your healthcare needs today.

## 2016-05-25 NOTE — Progress Notes (Signed)
Patient awakening,vss,report to rn 

## 2016-05-25 NOTE — Op Note (Signed)
[facility] Patient Name: Martin Downs Procedure Date: 05/25/2016 8:31 AM MRN: [id number] Endoscopist: Rachael Fee , MD Age: 50 Referring MD:  Date of Birth: June 05, 1966 Gender: Male Account #: [id number] Procedure:                Colonoscopy (incomplete due to poor prep) Indications:              High risk colon cancer surveillance: Personal                            history of colonic polyps; colonoscopy with Dr.                            Elnoria Howard 2005 for hematochezia; three small                            rectosigmoid adenomas removed. Medicines:                Monitored Anesthesia Care Procedure:                Pre-Anesthesia Assessment:                           - Prior to the procedure, a History and Physical                            was performed, and patient medications and                            allergies were reviewed. The patient's tolerance of                            previous anesthesia was also reviewed. The risks                            and benefits of the procedure and the sedation                            options and risks were discussed with the patient.                            All questions were answered, and informed consent                            was obtained. Prior Anticoagulants: The patient has                            taken no previous anticoagulant or antiplatelet                            agents. ASA Grade Assessment: II - A patient with                            mild systemic disease. After reviewing the risks  and benefits, the patient was deemed in                            satisfactory condition to undergo the procedure.                           After obtaining informed consent, the colonoscope                            was passed under direct vision. Throughout the                            procedure, the patient's blood pressure, pulse, and                            oxygen saturations  were monitored continuously. The                            Model CF-HQ190L [id number]) scope was introduced                            through the anus with the intention of advancing to                            the cecum. The scope was advanced to the descending                            colon before the procedure was aborted. Medications                            were given. The colonoscopy was aborted due to poor                            bowel prep. Lavage did not allow for the successful                            completion of the procedure. The quality of the                            bowel preparation was poor. The rectum was                            photographed. Scope In: 8:39:41 AM Scope Out: 8:43:51 AM Total Procedure Duration: 0 hours 4 minutes 10 seconds  Findings:                 A large amount of stool was found in the rectum,                            precluding visualization. Lavage of the area was                            performed, resulting in incomplete clearance with  continued poor visualization. Complications:            No immediate complications. Estimated blood loss:                            None. Estimated Blood Loss:     Estimated blood loss: none. Impression:               - The procedure was aborted due to poor bowel prep.                           - Stool in the rectum, sigmoid and descending                            segments.                           - No specimens collected. Recommendation:           - Patient has a contact number available for                            emergencies. The signs and symptoms of potential                            delayed complications were discussed with the                            patient. Return to normal activities tomorrow.                            Written discharge instructions were provided to the                            patient.                           -  Resume previous diet.                           - Continue present medications.                           - Repeat colonoscopy at the next available                            appointment for surveillance. My office will                            contact you to set this up: will be with                            gatorade/miralax split dose prep with zofran 4mg                             pill 1 hour prior to each dose of the prep. Rachael Fee, MD 05/25/2016 8:50:05 AM This  report has been signed electronically.

## 2016-05-28 ENCOUNTER — Telehealth: Payer: Self-pay

## 2016-05-28 NOTE — Telephone Encounter (Signed)
  Follow up Call-  Call back number 05/25/2016  Post procedure Call Back phone  # [phone]  Permission to leave phone message Yes  Some recent data might be hidden    Patient was called for follow up after his procedure on 05/25/2016. No answer at the number given for follow up phone call. A message was left on the answering machine. This was the second attempt to contact the patient.

## 2016-05-28 NOTE — Telephone Encounter (Signed)
Left message on answering machine. 

## 2016-05-29 ENCOUNTER — Telehealth: Payer: Self-pay

## 2016-05-29 ENCOUNTER — Encounter (HOSPITAL_COMMUNITY): Payer: Self-pay | Admitting: *Deleted

## 2016-05-29 DIAGNOSIS — E039 Hypothyroidism, unspecified: Secondary | ICD-10-CM | POA: Diagnosis present

## 2016-05-29 DIAGNOSIS — Z8249 Family history of ischemic heart disease and other diseases of the circulatory system: Secondary | ICD-10-CM

## 2016-05-29 DIAGNOSIS — K9181 Other intraoperative complications of digestive system: Secondary | ICD-10-CM | POA: Diagnosis not present

## 2016-05-29 DIAGNOSIS — K8000 Calculus of gallbladder with acute cholecystitis without obstruction: Secondary | ICD-10-CM | POA: Diagnosis present

## 2016-05-29 DIAGNOSIS — Z5331 Laparoscopic surgical procedure converted to open procedure: Secondary | ICD-10-CM

## 2016-05-29 DIAGNOSIS — G4730 Sleep apnea, unspecified: Secondary | ICD-10-CM | POA: Diagnosis present

## 2016-05-29 DIAGNOSIS — Y838 Other surgical procedures as the cause of abnormal reaction of the patient, or of later complication, without mention of misadventure at the time of the procedure: Secondary | ICD-10-CM | POA: Diagnosis not present

## 2016-05-29 DIAGNOSIS — F411 Generalized anxiety disorder: Secondary | ICD-10-CM | POA: Diagnosis present

## 2016-05-29 DIAGNOSIS — F1120 Opioid dependence, uncomplicated: Secondary | ICD-10-CM | POA: Diagnosis present

## 2016-05-29 DIAGNOSIS — A419 Sepsis, unspecified organism: Principal | ICD-10-CM | POA: Diagnosis present

## 2016-05-29 DIAGNOSIS — E782 Mixed hyperlipidemia: Secondary | ICD-10-CM | POA: Diagnosis present

## 2016-05-29 DIAGNOSIS — E230 Hypopituitarism: Secondary | ICD-10-CM | POA: Diagnosis present

## 2016-05-29 DIAGNOSIS — R064 Hyperventilation: Secondary | ICD-10-CM | POA: Diagnosis not present

## 2016-05-29 DIAGNOSIS — Z79899 Other long term (current) drug therapy: Secondary | ICD-10-CM

## 2016-05-29 DIAGNOSIS — Z888 Allergy status to other drugs, medicaments and biological substances status: Secondary | ICD-10-CM

## 2016-05-29 DIAGNOSIS — F329 Major depressive disorder, single episode, unspecified: Secondary | ICD-10-CM | POA: Diagnosis present

## 2016-05-29 DIAGNOSIS — K5900 Constipation, unspecified: Secondary | ICD-10-CM | POA: Diagnosis present

## 2016-05-29 DIAGNOSIS — K429 Umbilical hernia without obstruction or gangrene: Secondary | ICD-10-CM | POA: Diagnosis present

## 2016-05-29 DIAGNOSIS — K819 Cholecystitis, unspecified: Secondary | ICD-10-CM | POA: Diagnosis not present

## 2016-05-29 DIAGNOSIS — Z7982 Long term (current) use of aspirin: Secondary | ICD-10-CM

## 2016-05-29 DIAGNOSIS — E1165 Type 2 diabetes mellitus with hyperglycemia: Secondary | ICD-10-CM | POA: Diagnosis present

## 2016-05-29 DIAGNOSIS — T80818A Extravasation of other vesicant agent, initial encounter: Secondary | ICD-10-CM | POA: Diagnosis not present

## 2016-05-29 DIAGNOSIS — G8929 Other chronic pain: Secondary | ICD-10-CM | POA: Diagnosis present

## 2016-05-29 DIAGNOSIS — Z794 Long term (current) use of insulin: Secondary | ICD-10-CM

## 2016-05-29 LAB — CBC
HEMATOCRIT: 49.1 % (ref 39.0–52.0)
HEMOGLOBIN: 16.7 g/dL (ref 13.0–17.0)
MCH: 30.5 pg (ref 26.0–34.0)
MCHC: 34 g/dL (ref 30.0–36.0)
MCV: 89.6 fL (ref 78.0–100.0)
Platelets: 259 10*3/uL (ref 150–400)
RBC: 5.48 MIL/uL (ref 4.22–5.81)
RDW: 13.2 % (ref 11.5–15.5)
WBC: 38.3 10*3/uL — ABNORMAL HIGH (ref 4.0–10.5)

## 2016-05-29 LAB — COMPREHENSIVE METABOLIC PANEL
ALK PHOS: 34 U/L — AB (ref 38–126)
ALT: 25 U/L (ref 17–63)
ANION GAP: 13 (ref 5–15)
AST: 24 U/L (ref 15–41)
Albumin: 4.1 g/dL (ref 3.5–5.0)
BUN: 10 mg/dL (ref 6–20)
CALCIUM: 10.3 mg/dL (ref 8.9–10.3)
CO2: 25 mmol/L (ref 22–32)
CREATININE: 1.07 mg/dL (ref 0.61–1.24)
Chloride: 96 mmol/L — ABNORMAL LOW (ref 101–111)
Glucose, Bld: 256 mg/dL — ABNORMAL HIGH (ref 65–99)
Potassium: 4.8 mmol/L (ref 3.5–5.1)
SODIUM: 134 mmol/L — AB (ref 135–145)
Total Bilirubin: 2 mg/dL — ABNORMAL HIGH (ref 0.3–1.2)
Total Protein: 6.9 g/dL (ref 6.5–8.1)

## 2016-05-29 LAB — URINALYSIS, ROUTINE W REFLEX MICROSCOPIC
BILIRUBIN URINE: NEGATIVE
Glucose, UA: >1000 mg/dL — AB
HGB URINE DIPSTICK: NEGATIVE
Ketones, ur: 15 mg/dL — AB
Leukocytes, UA: NEGATIVE
Nitrite: NEGATIVE
PH: 6.5 (ref 5.0–8.0)
Protein, ur: NEGATIVE mg/dL
SPECIFIC GRAVITY, URINE: 1.042 — AB (ref 1.005–1.030)

## 2016-05-29 LAB — LIPASE, BLOOD: LIPASE: 19 U/L (ref 11–51)

## 2016-05-29 LAB — URINE MICROSCOPIC-ADD ON: RBC / HPF: NONE SEEN RBC/hpf (ref 0–5)

## 2016-05-29 MED ORDER — OXYCODONE-ACETAMINOPHEN 5-325 MG PO TABS
1.0000 | ORAL_TABLET | Freq: Once | ORAL | Status: AC
  Administered 2016-05-29: 1 via ORAL
  Filled 2016-05-29: qty 1

## 2016-05-29 MED ORDER — ONDANSETRON 4 MG PO TBDP
8.0000 mg | ORAL_TABLET | Freq: Once | ORAL | Status: AC
Start: 2016-05-29 — End: 2016-05-29
  Administered 2016-05-29: 8 mg via ORAL
  Filled 2016-05-29: qty 2

## 2016-05-29 NOTE — Telephone Encounter (Signed)
  Follow up Call-  Call back number 05/25/2016  Post procedure Call Back phone  # [phone]  Permission to leave phone message Yes  Some recent data might be hidden    Patient was called for follow up after his procedure on 05/25/2016. No answer at the number given for follow up phone call. A message was left on the answering machine. 

## 2016-05-29 NOTE — Telephone Encounter (Signed)
Per 05/25/16 colon pt needs repeat colon with mralax gatorade split dose prep with Zofran 4 mg 1 hour prior to each dose.

## 2016-05-29 NOTE — Telephone Encounter (Signed)
  Follow up Call-  Call back number 05/25/2016  Post procedure Call Back phone  # [phone]  Permission to leave phone message Yes  Some recent data might be hidden    Patient was called for follow up after his procedure on 05/25/2016. No answer at the number given for follow up phone call. A message was left on the answering machine.

## 2016-05-29 NOTE — ED Triage Notes (Signed)
The pt is c/o rt lower rib pain and some flank pain since yesterday  His pain has become much worse today  Worse with movement and respirations  Also c/o nausea  No urinary symptoms

## 2016-05-30 ENCOUNTER — Emergency Department (HOSPITAL_COMMUNITY): Payer: 59

## 2016-05-30 ENCOUNTER — Inpatient Hospital Stay (HOSPITAL_COMMUNITY): Payer: 59

## 2016-05-30 ENCOUNTER — Encounter (HOSPITAL_COMMUNITY): Payer: Self-pay | Admitting: Radiology

## 2016-05-30 ENCOUNTER — Inpatient Hospital Stay (HOSPITAL_COMMUNITY): Payer: 59 | Admitting: Certified Registered Nurse Anesthetist

## 2016-05-30 ENCOUNTER — Inpatient Hospital Stay (HOSPITAL_COMMUNITY)
Admission: EM | Admit: 2016-05-30 | Discharge: 2016-06-08 | DRG: 854 | Disposition: A | Discharge status: Home / Self Care | Payer: 59 | Attending: Internal Medicine | Admitting: Internal Medicine

## 2016-05-30 ENCOUNTER — Encounter (HOSPITAL_COMMUNITY)
Admission: EM | Disposition: A | Discharge status: Home / Self Care | Payer: Self-pay | Source: Home / Self Care | Attending: Internal Medicine

## 2016-05-30 DIAGNOSIS — Z888 Allergy status to other drugs, medicaments and biological substances status: Secondary | ICD-10-CM | POA: Diagnosis not present

## 2016-05-30 DIAGNOSIS — E039 Hypothyroidism, unspecified: Secondary | ICD-10-CM | POA: Diagnosis not present

## 2016-05-30 DIAGNOSIS — IMO0002 Reserved for concepts with insufficient information to code with codable children: Secondary | ICD-10-CM | POA: Diagnosis present

## 2016-05-30 DIAGNOSIS — F1120 Opioid dependence, uncomplicated: Secondary | ICD-10-CM | POA: Diagnosis not present

## 2016-05-30 DIAGNOSIS — F411 Generalized anxiety disorder: Secondary | ICD-10-CM | POA: Diagnosis not present

## 2016-05-30 DIAGNOSIS — K9181 Other intraoperative complications of digestive system: Secondary | ICD-10-CM | POA: Diagnosis not present

## 2016-05-30 DIAGNOSIS — G894 Chronic pain syndrome: Secondary | ICD-10-CM | POA: Diagnosis not present

## 2016-05-30 DIAGNOSIS — Z8249 Family history of ischemic heart disease and other diseases of the circulatory system: Secondary | ICD-10-CM | POA: Diagnosis not present

## 2016-05-30 DIAGNOSIS — F329 Major depressive disorder, single episode, unspecified: Secondary | ICD-10-CM | POA: Diagnosis not present

## 2016-05-30 DIAGNOSIS — K819 Cholecystitis, unspecified: Secondary | ICD-10-CM | POA: Diagnosis present

## 2016-05-30 DIAGNOSIS — Z01818 Encounter for other preprocedural examination: Secondary | ICD-10-CM

## 2016-05-30 DIAGNOSIS — Z7982 Long term (current) use of aspirin: Secondary | ICD-10-CM | POA: Diagnosis not present

## 2016-05-30 DIAGNOSIS — G4730 Sleep apnea, unspecified: Secondary | ICD-10-CM | POA: Diagnosis present

## 2016-05-30 DIAGNOSIS — R064 Hyperventilation: Secondary | ICD-10-CM | POA: Diagnosis not present

## 2016-05-30 DIAGNOSIS — A419 Sepsis, unspecified organism: Secondary | ICD-10-CM | POA: Diagnosis not present

## 2016-05-30 DIAGNOSIS — Z794 Long term (current) use of insulin: Secondary | ICD-10-CM | POA: Diagnosis not present

## 2016-05-30 DIAGNOSIS — K838 Other specified diseases of biliary tract: Secondary | ICD-10-CM

## 2016-05-30 DIAGNOSIS — T80818A Extravasation of other vesicant agent, initial encounter: Secondary | ICD-10-CM | POA: Diagnosis not present

## 2016-05-30 DIAGNOSIS — G8929 Other chronic pain: Secondary | ICD-10-CM | POA: Diagnosis not present

## 2016-05-30 DIAGNOSIS — Y838 Other surgical procedures as the cause of abnormal reaction of the patient, or of later complication, without mention of misadventure at the time of the procedure: Secondary | ICD-10-CM | POA: Diagnosis not present

## 2016-05-30 DIAGNOSIS — Z5331 Laparoscopic surgical procedure converted to open procedure: Secondary | ICD-10-CM | POA: Diagnosis not present

## 2016-05-30 DIAGNOSIS — K5900 Constipation, unspecified: Secondary | ICD-10-CM | POA: Diagnosis present

## 2016-05-30 DIAGNOSIS — K8000 Calculus of gallbladder with acute cholecystitis without obstruction: Secondary | ICD-10-CM | POA: Diagnosis not present

## 2016-05-30 DIAGNOSIS — F419 Anxiety disorder, unspecified: Secondary | ICD-10-CM | POA: Diagnosis not present

## 2016-05-30 DIAGNOSIS — E782 Mixed hyperlipidemia: Secondary | ICD-10-CM | POA: Diagnosis present

## 2016-05-30 DIAGNOSIS — E1165 Type 2 diabetes mellitus with hyperglycemia: Secondary | ICD-10-CM

## 2016-05-30 DIAGNOSIS — F339 Major depressive disorder, recurrent, unspecified: Secondary | ICD-10-CM | POA: Diagnosis present

## 2016-05-30 DIAGNOSIS — K810 Acute cholecystitis: Secondary | ICD-10-CM | POA: Diagnosis not present

## 2016-05-30 DIAGNOSIS — E230 Hypopituitarism: Secondary | ICD-10-CM | POA: Diagnosis not present

## 2016-05-30 DIAGNOSIS — Z79899 Other long term (current) drug therapy: Secondary | ICD-10-CM | POA: Diagnosis not present

## 2016-05-30 DIAGNOSIS — F32A Depression, unspecified: Secondary | ICD-10-CM | POA: Diagnosis present

## 2016-05-30 DIAGNOSIS — E1121 Type 2 diabetes mellitus with diabetic nephropathy: Secondary | ICD-10-CM

## 2016-05-30 DIAGNOSIS — E7800 Pure hypercholesterolemia, unspecified: Secondary | ICD-10-CM | POA: Diagnosis present

## 2016-05-30 DIAGNOSIS — K429 Umbilical hernia without obstruction or gangrene: Secondary | ICD-10-CM | POA: Diagnosis present

## 2016-05-30 DIAGNOSIS — Z419 Encounter for procedure for purposes other than remedying health state, unspecified: Secondary | ICD-10-CM

## 2016-05-30 HISTORY — DX: Cholecystitis, unspecified: K81.9

## 2016-05-30 HISTORY — PX: CHOLECYSTECTOMY: SHX55

## 2016-05-30 HISTORY — PX: UMBILICAL HERNIA REPAIR: SHX196

## 2016-05-30 LAB — GLUCOSE, CAPILLARY
GLUCOSE-CAPILLARY: 200 mg/dL — AB (ref 65–99)
GLUCOSE-CAPILLARY: 200 mg/dL — AB (ref 65–99)
Glucose-Capillary: 221 mg/dL — ABNORMAL HIGH (ref 65–99)
Glucose-Capillary: 242 mg/dL — ABNORMAL HIGH (ref 65–99)

## 2016-05-30 LAB — CBC
HCT: 44.2 % (ref 39.0–52.0)
HEMOGLOBIN: 15 g/dL (ref 13.0–17.0)
MCH: 30.4 pg (ref 26.0–34.0)
MCHC: 33.9 g/dL (ref 30.0–36.0)
MCV: 89.5 fL (ref 78.0–100.0)
PLATELETS: 223 10*3/uL (ref 150–400)
RBC: 4.94 MIL/uL (ref 4.22–5.81)
RDW: 13.9 % (ref 11.5–15.5)
WBC: 30 10*3/uL — AB (ref 4.0–10.5)

## 2016-05-30 LAB — TROPONIN I: Troponin I: 0.05 ng/mL (ref <0.03)

## 2016-05-30 LAB — PROTIME-INR
INR: 1.35
Prothrombin Time: 16.8 seconds — ABNORMAL HIGH (ref 11.4–15.2)

## 2016-05-30 LAB — CREATININE, SERUM
CREATININE: 1.13 mg/dL (ref 0.61–1.24)
GFR calc non Af Amer: >60 mL/min (ref >60)

## 2016-05-30 SURGERY — LAPAROSCOPIC CHOLECYSTECTOMY WITH INTRAOPERATIVE CHOLANGIOGRAM
Anesthesia: General | Site: Abdomen

## 2016-05-30 MED ORDER — SODIUM CHLORIDE 0.9 % IR SOLN
Status: DC | PRN
  Administered 2016-05-30 (×2): 1000 mL

## 2016-05-30 MED ORDER — HYDROMORPHONE HCL 2 MG/ML IJ SOLN
INTRAMUSCULAR | Status: AC
  Administered 2016-05-30: 0.5 mg
  Filled 2016-05-30: qty 1

## 2016-05-30 MED ORDER — MAGNESIUM CITRATE PO SOLN: 1.0000 | Freq: Once | ORAL | Status: DC | PRN

## 2016-05-30 MED ORDER — BISACODYL 10 MG RE SUPP: 10.0000 mg | Freq: Every day | RECTAL | Status: DC | PRN

## 2016-05-30 MED ORDER — ONDANSETRON 4 MG PO TBDP: 4.0000 mg | ORAL_TABLET | Freq: Four times a day (QID) | ORAL | Status: DC | PRN

## 2016-05-30 MED ORDER — HEMOSTATIC AGENTS (NO CHARGE) OPTIME
TOPICAL | Status: DC | PRN
  Administered 2016-05-30 (×2): 1 via TOPICAL

## 2016-05-30 MED ORDER — HYDROMORPHONE HCL 2 MG/ML IJ SOLN
0.5000 mg | INTRAMUSCULAR | Status: DC | PRN
Start: 2016-05-30 — End: 2016-05-30
  Administered 2016-05-30 (×4): 0.5 mg via INTRAVENOUS
  Filled 2016-05-30: qty 1

## 2016-05-30 MED ORDER — OXYCODONE HCL 5 MG PO TABS
5.0000 mg | ORAL_TABLET | Freq: Once | ORAL | Status: DC | PRN
Start: 2016-05-30 — End: 2016-05-30

## 2016-05-30 MED ORDER — PIPERACILLIN-TAZOBACTAM 3.375 G IVPB
3.3750 g | Freq: Three times a day (TID) | INTRAVENOUS | Status: AC
  Administered 2016-05-30 – 2016-06-03 (×13): 3.375 g via INTRAVENOUS
  Filled 2016-05-30 (×15): qty 50

## 2016-05-30 MED ORDER — 0.9 % SODIUM CHLORIDE (POUR BTL) OPTIME
TOPICAL | Status: DC | PRN
  Administered 2016-05-30: 1000 mL

## 2016-05-30 MED ORDER — HYDROMORPHONE 1 MG/ML IV SOLN
INTRAVENOUS | Status: DC
  Administered 2016-05-30: 13:00:00 via INTRAVENOUS
  Administered 2016-05-30: 4.1 mg via INTRAVENOUS
  Administered 2016-05-31: 2.1 mg via INTRAVENOUS
  Administered 2016-05-31 (×2): 0.6 mg via INTRAVENOUS
  Administered 2016-05-31: 3.9 mg via INTRAVENOUS
  Administered 2016-06-01: 0.3 mg via INTRAVENOUS
  Administered 2016-06-01: 9 mg via INTRAVENOUS
  Administered 2016-06-02: 2.1 mg via INTRAVENOUS
  Administered 2016-06-02: 1 mg via INTRAVENOUS
  Administered 2016-06-02: 5.4 mg via INTRAVENOUS
  Administered 2016-06-02 – 2016-06-03 (×2): 3.6 mg via INTRAVENOUS
  Administered 2016-06-03: 3 mg via INTRAVENOUS
  Administered 2016-06-03: 0.9 mg via INTRAVENOUS
  Administered 2016-06-03: 2.1 mg via INTRAVENOUS
  Administered 2016-06-04: 3.6 mg via INTRAVENOUS
  Administered 2016-06-04: 04:00:00 via INTRAVENOUS
  Administered 2016-06-04: 1.8 mg via INTRAVENOUS
  Filled 2016-05-30 (×3): qty 25

## 2016-05-30 MED ORDER — HYDROMORPHONE HCL 2 MG/ML IJ SOLN
1.5000 mg | Freq: Once | INTRAMUSCULAR | Status: AC
Start: 2016-05-30 — End: 2016-05-30
  Administered 2016-05-30: 1.5 mg via INTRAVENOUS
  Filled 2016-05-30: qty 1

## 2016-05-30 MED ORDER — HYDROMORPHONE HCL 2 MG/ML IJ SOLN
1.0000 mg | Freq: Once | INTRAMUSCULAR | Status: AC
  Administered 2016-05-30: 1 mg via INTRAVENOUS
  Filled 2016-05-30: qty 1

## 2016-05-30 MED ORDER — SODIUM CHLORIDE 0.9 % IV SOLN
INTRAVENOUS | Status: DC | PRN
  Administered 2016-05-30: 29 mL

## 2016-05-30 MED ORDER — IOPAMIDOL (ISOVUE-300) INJECTION 61%
INTRAVENOUS | Status: AC
  Filled 2016-05-30: qty 50

## 2016-05-30 MED ORDER — SODIUM CHLORIDE 0.9 % IV SOLN
1000.0000 mL | INTRAVENOUS | Status: DC
  Administered 2016-05-30: 1000 mL via INTRAVENOUS

## 2016-05-30 MED ORDER — SODIUM CHLORIDE 0.9% FLUSH: 9.0000 mL | INTRAVENOUS | Status: DC | PRN

## 2016-05-30 MED ORDER — FENOFIBRATE 160 MG PO TABS: 160.0000 mg | ORAL_TABLET | Freq: Every day | ORAL | Status: DC

## 2016-05-30 MED ORDER — DEXMEDETOMIDINE HCL 200 MCG/2ML IV SOLN
INTRAVENOUS | Status: DC | PRN
  Administered 2016-05-30 (×2): 40 ug via INTRAVENOUS

## 2016-05-30 MED ORDER — DIPHENHYDRAMINE HCL 50 MG/ML IJ SOLN: 25.0000 mg | Freq: Four times a day (QID) | INTRAMUSCULAR | Status: DC | PRN

## 2016-05-30 MED ORDER — DIPHENHYDRAMINE HCL 12.5 MG/5ML PO ELIX: 12.5000 mg | ORAL_SOLUTION | Freq: Four times a day (QID) | ORAL | Status: DC | PRN

## 2016-05-30 MED ORDER — ONDANSETRON HCL 40 MG/20ML IJ SOLN
8.0000 mg | Freq: Four times a day (QID) | INTRAMUSCULAR | Status: DC | PRN
  Filled 2016-05-30: qty 4

## 2016-05-30 MED ORDER — LIDOCAINE HCL (CARDIAC) 20 MG/ML IV SOLN
INTRAVENOUS | Status: DC | PRN
  Administered 2016-05-30: 60 mg via INTRAVENOUS

## 2016-05-30 MED ORDER — PIPERACILLIN-TAZOBACTAM 3.375 G IVPB 30 MIN
3.3750 g | Freq: Once | INTRAVENOUS | Status: AC
  Administered 2016-05-30: 3.375 g via INTRAVENOUS
  Filled 2016-05-30: qty 50

## 2016-05-30 MED ORDER — INSULIN ASPART 100 UNIT/ML ~~LOC~~ SOLN
0.0000 [IU] | Freq: Three times a day (TID) | SUBCUTANEOUS | Status: DC
  Administered 2016-05-30: 3 [IU] via SUBCUTANEOUS
  Administered 2016-05-30 – 2016-06-01 (×5): 2 [IU] via SUBCUTANEOUS
  Administered 2016-06-01: 3 [IU] via SUBCUTANEOUS
  Administered 2016-06-01 – 2016-06-02 (×2): 2 [IU] via SUBCUTANEOUS
  Administered 2016-06-02: 3 [IU] via SUBCUTANEOUS
  Administered 2016-06-03: 1 [IU] via SUBCUTANEOUS
  Administered 2016-06-03: 3 [IU] via SUBCUTANEOUS
  Administered 2016-06-03 – 2016-06-06 (×9): 2 [IU] via SUBCUTANEOUS
  Administered 2016-06-06: 1 [IU] via SUBCUTANEOUS
  Administered 2016-06-07 (×2): 2 [IU] via SUBCUTANEOUS
  Administered 2016-06-07: 3 [IU] via SUBCUTANEOUS
  Administered 2016-06-08: 2 [IU] via SUBCUTANEOUS

## 2016-05-30 MED ORDER — SUGAMMADEX SODIUM 200 MG/2ML IV SOLN
INTRAVENOUS | Status: DC | PRN
  Administered 2016-05-30: 400 mg via INTRAVENOUS

## 2016-05-30 MED ORDER — ONDANSETRON HCL 4 MG/2ML IJ SOLN: 4.0000 mg | Freq: Four times a day (QID) | INTRAMUSCULAR | Status: DC | PRN

## 2016-05-30 MED ORDER — BUPIVACAINE HCL (PF) 0.25 % IJ SOLN
INTRAMUSCULAR | Status: AC
  Filled 2016-05-30: qty 30

## 2016-05-30 MED ORDER — FENTANYL CITRATE (PF) 100 MCG/2ML IJ SOLN
INTRAMUSCULAR | Status: AC
  Filled 2016-05-30: qty 4

## 2016-05-30 MED ORDER — MIDAZOLAM HCL 2 MG/2ML IJ SOLN
INTRAMUSCULAR | Status: AC
  Filled 2016-05-30: qty 2

## 2016-05-30 MED ORDER — LACTATED RINGERS IV SOLN
INTRAVENOUS | Status: DC | PRN
  Administered 2016-05-30 (×2): via INTRAVENOUS

## 2016-05-30 MED ORDER — FENTANYL CITRATE (PF) 100 MCG/2ML IJ SOLN
INTRAMUSCULAR | Status: DC | PRN
  Administered 2016-05-30 (×3): 50 ug via INTRAVENOUS
  Administered 2016-05-30 (×2): 100 ug via INTRAVENOUS
  Administered 2016-05-30: 50 ug via INTRAVENOUS
  Administered 2016-05-30 (×2): 100 ug via INTRAVENOUS
  Administered 2016-05-30: 50 ug via INTRAVENOUS
  Administered 2016-05-30: 100 ug via INTRAVENOUS
  Administered 2016-05-30: 50 ug via INTRAVENOUS

## 2016-05-30 MED ORDER — TRAZODONE HCL 50 MG PO TABS: 25.0000 mg | ORAL_TABLET | Freq: Every evening | ORAL | Status: DC | PRN

## 2016-05-30 MED ORDER — ASPIRIN EC 81 MG PO TBEC: 81.0000 mg | DELAYED_RELEASE_TABLET | Freq: Every day | ORAL | Status: DC

## 2016-05-30 MED ORDER — PAROXETINE HCL 30 MG PO TABS
30.0000 mg | ORAL_TABLET | Freq: Every day | ORAL | Status: DC
  Administered 2016-05-31 – 2016-06-08 (×9): 30 mg via ORAL
  Filled 2016-05-30 (×11): qty 1

## 2016-05-30 MED ORDER — ONDANSETRON HCL 4 MG/2ML IJ SOLN
INTRAMUSCULAR | Status: DC | PRN
  Administered 2016-05-30: 4 mg via INTRAVENOUS

## 2016-05-30 MED ORDER — OXYCODONE HCL 5 MG/5ML PO SOLN: 5.0000 mg | Freq: Once | ORAL | Status: DC | PRN

## 2016-05-30 MED ORDER — ROSUVASTATIN CALCIUM 10 MG PO TABS: 10.0000 mg | ORAL_TABLET | Freq: Every day | ORAL | Status: DC

## 2016-05-30 MED ORDER — HYDROMORPHONE HCL 1 MG/ML IJ SOLN: 0.2500 mg | INTRAMUSCULAR | Status: DC | PRN

## 2016-05-30 MED ORDER — KETOROLAC TROMETHAMINE 30 MG/ML IJ SOLN
30.0000 mg | Freq: Once | INTRAMUSCULAR | Status: AC
Start: 2016-05-30 — End: 2016-05-30
  Administered 2016-05-30: 30 mg via INTRAVENOUS
  Filled 2016-05-30: qty 1

## 2016-05-30 MED ORDER — FENTANYL CITRATE (PF) 100 MCG/2ML IJ SOLN
INTRAMUSCULAR | Status: AC
  Filled 2016-05-30: qty 2

## 2016-05-30 MED ORDER — BUPIVACAINE HCL (PF) 0.25 % IJ SOLN
INTRAMUSCULAR | Status: DC | PRN
  Administered 2016-05-30: 9 mL

## 2016-05-30 MED ORDER — ONDANSETRON HCL 4 MG/2ML IJ SOLN
4.0000 mg | Freq: Four times a day (QID) | INTRAMUSCULAR | Status: DC | PRN
  Filled 2016-05-30: qty 2

## 2016-05-30 MED ORDER — ONDANSETRON HCL 4 MG PO TABS: 4.0000 mg | ORAL_TABLET | Freq: Four times a day (QID) | ORAL | Status: DC | PRN

## 2016-05-30 MED ORDER — METHADONE HCL 10 MG PO TABS
180.0000 mg | ORAL_TABLET | Freq: Every day | ORAL | Status: DC
  Administered 2016-05-30 – 2016-06-07 (×9): 180 mg via ORAL
  Filled 2016-05-30 (×9): qty 18

## 2016-05-30 MED ORDER — SODIUM CHLORIDE 0.9 % IV SOLN
INTRAVENOUS | Status: DC
  Administered 2016-05-30 – 2016-06-05 (×6): via INTRAVENOUS

## 2016-05-30 MED ORDER — LABETALOL HCL 5 MG/ML IV SOLN
INTRAVENOUS | Status: DC | PRN
  Administered 2016-05-30: 5 mg via INTRAVENOUS

## 2016-05-30 MED ORDER — DIPHENHYDRAMINE HCL 25 MG PO CAPS: 25.0000 mg | ORAL_CAPSULE | Freq: Four times a day (QID) | ORAL | Status: DC | PRN

## 2016-05-30 MED ORDER — IOPAMIDOL (ISOVUE-300) INJECTION 61%
INTRAVENOUS | Status: AC
  Administered 2016-05-30: 100 mL
  Filled 2016-05-30: qty 100

## 2016-05-30 MED ORDER — MIDAZOLAM HCL 5 MG/5ML IJ SOLN
INTRAMUSCULAR | Status: DC | PRN
  Administered 2016-05-30 (×2): 1 mg via INTRAVENOUS

## 2016-05-30 MED ORDER — SODIUM CHLORIDE 0.9 % IV SOLN
1000.0000 mL | Freq: Once | INTRAVENOUS | Status: AC
  Administered 2016-05-30: 1000 mL via INTRAVENOUS

## 2016-05-30 MED ORDER — NALOXONE HCL 0.4 MG/ML IJ SOLN: 0.4000 mg | INTRAMUSCULAR | Status: DC | PRN

## 2016-05-30 MED ORDER — SENNOSIDES-DOCUSATE SODIUM 8.6-50 MG PO TABS: 1.0000 | ORAL_TABLET | Freq: Every evening | ORAL | Status: DC | PRN

## 2016-05-30 MED ORDER — DIPHENHYDRAMINE HCL 50 MG/ML IJ SOLN: 12.5000 mg | Freq: Four times a day (QID) | INTRAMUSCULAR | Status: DC | PRN

## 2016-05-30 MED ORDER — PROPOFOL 10 MG/ML IV BOLUS
INTRAVENOUS | Status: AC
Start: 2016-05-30 — End: 2016-05-30
  Filled 2016-05-30: qty 20

## 2016-05-30 MED ORDER — ENOXAPARIN SODIUM 40 MG/0.4ML ~~LOC~~ SOLN
40.0000 mg | SUBCUTANEOUS | Status: DC
  Administered 2016-05-31 – 2016-06-08 (×9): 40 mg via SUBCUTANEOUS
  Filled 2016-05-30 (×9): qty 0.4

## 2016-05-30 MED ORDER — ROCURONIUM BROMIDE 100 MG/10ML IV SOLN
INTRAVENOUS | Status: DC | PRN
  Administered 2016-05-30: 20 mg via INTRAVENOUS
  Administered 2016-05-30: 10 mg via INTRAVENOUS
  Administered 2016-05-30: 20 mg via INTRAVENOUS
  Administered 2016-05-30: 30 mg via INTRAVENOUS
  Administered 2016-05-30: 20 mg via INTRAVENOUS
  Administered 2016-05-30: 50 mg via INTRAVENOUS

## 2016-05-30 MED ORDER — HYDROMORPHONE 1 MG/ML IV SOLN
INTRAVENOUS | Status: AC
  Filled 2016-05-30: qty 25

## 2016-05-30 MED ORDER — ONDANSETRON HCL 4 MG/2ML IJ SOLN
4.0000 mg | Freq: Once | INTRAMUSCULAR | Status: AC
  Administered 2016-05-30: 4 mg via INTRAVENOUS
  Filled 2016-05-30: qty 2

## 2016-05-30 MED ORDER — METHOCARBAMOL 500 MG PO TABS
500.0000 mg | ORAL_TABLET | Freq: Four times a day (QID) | ORAL | Status: DC | PRN
  Administered 2016-05-30: 500 mg via ORAL
  Filled 2016-05-30: qty 1

## 2016-05-30 MED ORDER — FENTANYL CITRATE (PF) 100 MCG/2ML IJ SOLN
INTRAMUSCULAR | Status: AC
  Filled 2016-05-30: qty 6

## 2016-05-30 MED ORDER — PROPOFOL 10 MG/ML IV BOLUS
INTRAVENOUS | Status: DC | PRN
  Administered 2016-05-30: 200 mg via INTRAVENOUS

## 2016-05-30 SURGICAL SUPPLY — 67 items
ADAPTER CATH SYR TO TUBING 38M (ADAPTER) ×2 IMPLANT
ADPR CATH LL SYR 3/32 TPR (ADAPTER) ×2
APL SKNCLS STERI-STRIP NONHPOA (GAUZE/BANDAGES/DRESSINGS) ×2
APPLIER CLIP 9.375 MED OPEN (MISCELLANEOUS) ×3
APPLIER CLIP ROT 10 11.4 M/L (STAPLE) ×3
APR CLP MED 9.3 20 MLT OPN (MISCELLANEOUS) ×2
APR CLP MED LRG 11.4X10 (STAPLE) ×2
BAG BILE T-TUBES STRL (MISCELLANEOUS) ×2 IMPLANT
BAG DRN 9.5 2 ADJ BELT ADPR (MISCELLANEOUS) ×2
BAG SPEC RTRVL LRG 6X4 10 (ENDOMECHANICALS) ×2
BENZOIN TINCTURE PRP APPL 2/3 (GAUZE/BANDAGES/DRESSINGS) ×3 IMPLANT
BLADE SURG ROTATE 9660 (MISCELLANEOUS) IMPLANT
CANISTER SUCTION 2500CC (MISCELLANEOUS) ×3 IMPLANT
CATH T TUBE WHEL MOSS 14FR (CATHETERS) ×2 IMPLANT
CHLORAPREP W/TINT 26ML (MISCELLANEOUS) ×3 IMPLANT
CLIP APPLIE 9.375 MED OPEN (MISCELLANEOUS) ×1 IMPLANT
CLIP APPLIE ROT 10 11.4 M/L (STAPLE) ×2 IMPLANT
CLIP TI MEDIUM 6 (CLIP) ×2 IMPLANT
COVER SURGICAL LIGHT HANDLE (MISCELLANEOUS) ×3 IMPLANT
DRAIN CHANNEL 19F RND (DRAIN) ×2 IMPLANT
DRSG COVADERM 4X10 (GAUZE/BANDAGES/DRESSINGS) ×2 IMPLANT
DRSG TEGADERM 2-3/8X2-3/4 SM (GAUZE/BANDAGES/DRESSINGS) ×9 IMPLANT
DRSG TEGADERM 4X4.75 (GAUZE/BANDAGES/DRESSINGS) ×3 IMPLANT
ELECT CAUTERY BLADE 6.4 (BLADE) ×2 IMPLANT
ELECT REM PT RETURN 9FT ADLT (ELECTROSURGICAL) ×3
ELECTRODE REM PT RTRN 9FT ADLT (ELECTROSURGICAL) ×2 IMPLANT
EVACUATOR SILICONE 100CC (DRAIN) ×2 IMPLANT
FILTER SMOKE EVAC LAPAROSHD (FILTER) ×3 IMPLANT
GAUZE SPONGE 2X2 8PLY STRL LF (GAUZE/BANDAGES/DRESSINGS) ×3 IMPLANT
GLOVE BIO SURGEON STRL SZ7 (GLOVE) ×3 IMPLANT
GLOVE BIOGEL PI IND STRL 7.5 (GLOVE) ×2 IMPLANT
GLOVE BIOGEL PI INDICATOR 7.5 (GLOVE) ×1
GOWN STRL REUS W/ TWL LRG LVL3 (GOWN DISPOSABLE) ×6 IMPLANT
GOWN STRL REUS W/TWL LRG LVL3 (GOWN DISPOSABLE) ×9
HEMOSTAT SNOW SURGICEL 2X4 (HEMOSTASIS) ×6 IMPLANT
KIT BASIN OR (CUSTOM PROCEDURE TRAY) ×3 IMPLANT
KIT ROOM TURNOVER OR (KITS) ×3 IMPLANT
NS IRRIG 1000ML POUR BTL (IV SOLUTION) ×3 IMPLANT
PAD ARMBOARD 7.5X6 YLW CONV (MISCELLANEOUS) ×3 IMPLANT
PENCIL BUTTON HOLSTER BLD 10FT (ELECTRODE) ×2 IMPLANT
POUCH SPECIMEN RETRIEVAL 10MM (ENDOMECHANICALS) ×3 IMPLANT
SCISSORS LAP 5X35 DISP (ENDOMECHANICALS) ×3 IMPLANT
SET IRRIG TUBING LAPAROSCOPIC (IRRIGATION / IRRIGATOR) ×3 IMPLANT
SLEEVE ENDOPATH XCEL 5M (ENDOMECHANICALS) ×3 IMPLANT
SPECIMEN JAR SMALL (MISCELLANEOUS) ×3 IMPLANT
SPONGE GAUZE 2X2 STER 10/PKG (GAUZE/BANDAGES/DRESSINGS) ×2
STAPLER VISISTAT 35W (STAPLE) ×2 IMPLANT
STRIP CLOSURE SKIN 1/2X4 (GAUZE/BANDAGES/DRESSINGS) ×3 IMPLANT
SUT ETHILON 2 0 FS 18 (SUTURE) ×2 IMPLANT
SUT MNCRL AB 4-0 PS2 18 (SUTURE) ×3 IMPLANT
SUT NOVA 1 T20/GS 25DT (SUTURE) ×2 IMPLANT
SUT PDS AB 1 CTX 36 (SUTURE) ×4 IMPLANT
SUT PDS AB 3-0 SH 27 (SUTURE) ×6 IMPLANT
SUT PDS AB 4-0 RB1 27 (SUTURE) ×6 IMPLANT
SUT SILK 2 0 (SUTURE) ×3
SUT SILK 2 0 SH CR/8 (SUTURE) ×2 IMPLANT
SUT SILK 2-0 18XBRD TIE 12 (SUTURE) ×1 IMPLANT
SUT SILK 3 0 (SUTURE) ×3
SUT SILK 3-0 18XBRD TIE 12 (SUTURE) ×1 IMPLANT
TOWEL OR 17X24 6PK STRL BLUE (TOWEL DISPOSABLE) ×3 IMPLANT
TOWEL OR 17X26 10 PK STRL BLUE (TOWEL DISPOSABLE) ×3 IMPLANT
TRAY LAPAROSCOPIC MC (CUSTOM PROCEDURE TRAY) ×3 IMPLANT
TROCAR XCEL BLUNT TIP 100MML (ENDOMECHANICALS) ×3 IMPLANT
TROCAR XCEL NON-BLD 11X100MML (ENDOMECHANICALS) ×3 IMPLANT
TROCAR XCEL NON-BLD 5MMX100MML (ENDOMECHANICALS) ×3 IMPLANT
TUBING INSUFFLATION (TUBING) ×3 IMPLANT
YANKAUER SUCT BULB TIP NO VENT (SUCTIONS) ×2 IMPLANT

## 2016-05-30 NOTE — Progress Notes (Signed)
Report given to david reesw rn as caregiver

## 2016-05-30 NOTE — Progress Notes (Signed)
Pharmacy Antibiotic Note  Dimas ChyleJerry Thomas Kellett is a 50 y.o. male admitted on 05/30/2016 with intra-abd infection (CT findings concerning for cholecystitis).  Pharmacy has been consulted for Zosyn dosing.  Zosyn 3.375gm IV given in ED ~0500  Plan: Zosyn 3.375gm IV q8h - doses over 4 hours Will f/u micro data, renal function, and pt's clinical condition    Height: 5\' 9"  (175.3 cm) Weight: 220 lb (99.8 kg) IBW/kg (Calculated) : 70.7  Temp (24hrs), Avg:98.8 F (37.1 C), Min:98.3 F (36.8 C), Max:99.3 F (37.4 C)   Recent Labs Lab 05/29/16 2246  WBC 38.3*  CREATININE 1.07    Estimated Creatinine Clearance: 94 mL/min (by C-G formula based on SCr of 1.07 mg/dL).    Allergies  Allergen Reactions  . Gemfibrozil     REACTION: diarrhea    Antimicrobials this admission: 11/1 Zosyn >>   Microbiology results:   Thank you for allowing pharmacy to be a part of this patient's care.  Christoper Fabianaron Jermiah Soderman, PharmD, BCPS Clinical pharmacist, pager [phone] 05/30/2016 5:53 AM

## 2016-05-30 NOTE — H&P (Signed)
History and Physical    Martin Downs LDJ:[id number] DOB: 13-Dec-1965 DOA: 05/30/2016   PCP: [name], MD   Patient coming from:  Home   Chief Complaint: Right upper quadrant pain   HPI: Martin Downs is a 50 y.o. male with medical history significant for DM, HLD, chronic back pain opioid dependent on methadone, presenting to the ED with Right abdominal pain since 10/30 at night, accompanied by nausea without vomiting, with radiation to the right shoulder and back, poor oral intake. His pain continues to be increasingly worse this morning. Denies any similar symptoms in the past. Denies subjective fevers, chills, night sweats, vision changes, or mucositis. Denies any respiratory complaints. Denies any chest pain or palpitations. Denies lower extremity swelling. Denies change in bowel habits. Last bowel movement on 10/31  Denies any dysuria. Denies abnormal skin rashes, or neuropathy. Denies any bleeding issues such as epistaxis, hematemesis, hematuria or hematochezia. Ambulating without difficulty. No ETOH or tobacco .    ED Course:  BP 153/85   Pulse 94   Temp 99.3 F (37.4 C) (Oral)   Resp 15   Ht '5\' 9"'$  (1.753 m)   Wt 99.8 kg (220 lb)   SpO2 97%   BMI 32.49 kg/m    white count 38.3 glucose 256 CT of the abdomen and pelvis shows: with mild gallblader mural thickening and 30 coalesced cystic inflammatory stranding, representing acute cholecystitis. No bile duct dilatation Received Dilaudid  0.5 mg q 2 but currently at 1.5 mg and oxycodone 5/325 without relief He is in Zosyn IV   Review of Systems: As per HPI otherwise 10 point review of systems negative.   Past Medical History:  Diagnosis Date  . Anxiety   . Borderline diabetes   . Chronic pain   . Depression   . Diabetes mellitus without complication ([place])   . Hyperlipidemia   . Narcotic dependence ([place])   . Sleep apnea    no cpap    Past Surgical History:  Procedure Laterality Date  . removal of bone spur  Bilateral 1995   elbps  . TOOTH EXTRACTION  2017    Social History Social History   Social History  . Marital status: Married    Spouse name: N/A  . Number of children: Y  . Years of education: N/A   Occupational History  . Dealer Unemployed   Social History Main Topics  . Smoking status: Never Smoker  . Smokeless tobacco: Current User    Types: Snuff  . Alcohol use No  . Drug use: No  . Sexual activity: Not on file   Other Topics Concern  . Not on file   Social History Narrative  . No narrative on file     Allergies  Allergen Reactions  . Gemfibrozil     REACTION: diarrhea    Family History  Problem Relation Age of Onset  . Heart disease Father   . Cancer Father   . Colon cancer Neg Hx       Prior to Admission medications   Medication Sig Start Date End Date Taking? Authorizing Provider  aspirin 81 MG tablet Take 81 mg by mouth daily.     Yes Historical Provider, MD  B-D INS SYRINGE 0.5CC/31GX5/16 31G X 5/16" 0.5 ML MISC USE 3 TIMES DAILY 07/26/15  Yes Philemon Kingdom, MD  Blood Glucose Monitoring Suppl (ONE TOUCH ULTRA MINI) W/DEVICE KIT Use as directed to test blood sugar twice daily 250.00 04/29/14  Yes Marciano Sequin  Deborra Medina, MD  FARXIGA 10 MG TABS tablet TAKE 1 TABLET BY MOUTH NIGHTLY 05/16/16  Yes Philemon Kingdom, MD  fenofibrate 160 MG tablet TAKE 1 TABLET (160 MG TOTAL) BY MOUTH DAILY. 01/09/16  Yes Lucille Passy, MD  fluticasone (FLONASE) 50 MCG/ACT nasal spray PLACE 2 SPRAYS INTO BOTH NOSTRILS DAILY. 02/09/16  Yes Lucille Passy, MD  Insulin Detemir (LEVEMIR FLEXPEN) 100 UNIT/ML Pen Inject 30 Units into the skin every morning. Patient taking differently: Inject 25 Units into the skin every morning.  01/19/16  Yes Philemon Kingdom, MD  Insulin Glargine (BASAGLAR KWIKPEN) 100 UNIT/ML SOPN Inject 0.15 mLs (15 Units total) into the skin at bedtime. 03/26/16  Yes Philemon Kingdom, MD  Insulin Pen Needle 32G X 4 MM MISC Use to inject insulin 1 time daily as  instructed. 01/19/16  Yes Philemon Kingdom, MD  metFORMIN (GLUCOPHAGE) 1000 MG tablet TAKE 1 TABLET (1,000 MG TOTAL) BY MOUTH 2 (TWO) TIMES DAILY WITH A MEAL. 02/10/16  Yes Philemon Kingdom, MD  methadone (DOLOPHINE) 10 MG/ML solution Take 180 mg by mouth daily.    Yes Historical Provider, MD  Multiple Vitamin (MULTIVITAMIN) tablet Take 2 tablets by mouth 2 (two) times daily.   Yes Historical Provider, MD  ONE TOUCH ULTRA TEST test strip USE 8 TIMES A DAY AS INSTRUCTED 07/26/15  Yes Philemon Kingdom, MD  [facility] DELICA LANCETS 62B MISC USE TO TEST BLOOD SUGAR 4 TIMES DAILY AS INSTRUCTED. 10/24/15  Yes Philemon Kingdom, MD  PARoxetine (PAXIL) 30 MG tablet Take 1 tablet (30 mg total) by mouth daily. 04/12/16  Yes Lucille Passy, MD  rosuvastatin (CRESTOR) 10 MG tablet Take 10 mg by mouth daily.  03/12/16  Yes Historical Provider, MD  sildenafil (VIAGRA) 100 MG tablet Take 0.5-1 tablets (50-100 mg total) by mouth daily as needed for erectile dysfunction. 03/13/16  Yes Lucille Passy, MD  SYRINGE-NEEDLE, DISP, 3 ML 18G X 1-1/2" 3 ML MISC Use to inject testosterone. 07/19/15  Yes Philemon Kingdom, MD  TANZEUM 50 MG PEN INJECT 1 SYRINGEGUL ('50MG'$ ) UNDER SKIN ONCE A WEEK 05/16/16  Yes Philemon Kingdom, MD  testosterone cypionate (DEPOTESTOSTERONE CYPIONATE) 200 MG/ML injection INJECT 0.25 MILLILITERS INTRAMUSCULARLY ONCE PER WEEK 02/10/16  Yes Philemon Kingdom, MD  vitamin B-12 (CYANOCOBALAMIN) 1000 MCG tablet Take 1,000 mcg by mouth daily.   Yes Historical Provider, MD    Physical Exam:    Vitals:   05/29/16 2233 05/30/16 0147 05/30/16 0349 05/30/16 0500  BP:  133/63 155/80 153/85  Pulse:  89 100 94  Resp:  '20 17 15  '$ Temp:  99.3 F (37.4 C)    TempSrc:  Oral    SpO2:  97% 97% 97%  Weight: 99.8 kg (220 lb)     Height: '5\' 9"'$  (1.753 m)          Constitutional: NAD,anxious  uncomfortable   Vitals:   05/29/16 2233 05/30/16 0147 05/30/16 0349 05/30/16 0500  BP:  133/63 155/80 153/85  Pulse:  89 100  94  Resp:  '20 17 15  '$ Temp:  99.3 F (37.4 C)    TempSrc:  Oral    SpO2:  97% 97% 97%  Weight: 99.8 kg (220 lb)     Height: '5\' 9"'$  (1.753 m)      Eyes: PERRL, lids and conjunctivae normal ENMT: Mucous membranes are moist. Posterior pharynx clear of any exudate or lesions.Normal dentition.  Neck: normal, supple, no masses, no thyromegaly Respiratory: clear to auscultation bilaterally, no wheezing, no crackles. Normal  respiratory effort. No accessory muscle use.  Cardiovascular: Regular rate and rhythm, no murmurs / rubs / gallops. No extremity edema. 2+ pedal pulses. No carotid bruits.  Abdomen: exquisite RUQ tenderness, no masses palpated. No hepatosplenomegaly. Bowel sounds positive.  Musculoskeletal: no clubbing / cyanosis. No joint deformity upper and lower extremities. Good ROM, no contractures. Normal muscle tone.  Skin: no rashes, lesions, ulcers.  Neurologic: CN 2-12 grossly intact. Sensation intact, DTR normal. Strength 5/5 in all 4.  Psychiatric: Normal judgment and insight. Alert and oriented x 3.anxious mood.     Labs on Admission: I have personally reviewed following labs and imaging studies  CBC:  Recent Labs Lab 05/29/16 2246  WBC 38.3*  HGB 16.7  HCT 49.1  MCV 89.6  PLT 350    Basic Metabolic Panel:  Recent Labs Lab 05/29/16 2246  NA 134*  K 4.8  CL 96*  CO2 25  GLUCOSE 256*  BUN 10  CREATININE 1.07  CALCIUM 10.3    GFR: Estimated Creatinine Clearance: 94 mL/min (by C-G formula based on SCr of 1.07 mg/dL).  Liver Function Tests:  Recent Labs Lab 05/29/16 2246  AST 24  ALT 25  ALKPHOS 34*  BILITOT 2.0*  PROT 6.9  ALBUMIN 4.1    Recent Labs Lab 05/29/16 2246  LIPASE 19   No results for input(s): AMMONIA in the last 168 hours.  Coagulation Profile: No results for input(s): INR, PROTIME in the last 168 hours.  Cardiac Enzymes: No results for input(s): CKTOTAL, CKMB, CKMBINDEX, TROPONINI in the last 168 hours.  BNP (last 3  results) No results for input(s): PROBNP in the last 8760 hours.  HbA1C: No results for input(s): HGBA1C in the last 72 hours.  CBG:  Recent Labs Lab 05/25/16 0750 05/25/16 0853  GLUCAP 109* 123*    Lipid Profile: No results for input(s): CHOL, HDL, LDLCALC, TRIG, CHOLHDL, LDLDIRECT in the last 72 hours.  Thyroid Function Tests: No results for input(s): TSH, T4TOTAL, FREET4, T3FREE, THYROIDAB in the last 72 hours.  Anemia Panel: No results for input(s): VITAMINB12, FOLATE, FERRITIN, TIBC, IRON, RETICCTPCT in the last 72 hours.  Urine analysis:    Component Value Date/Time   COLORURINE YELLOW 05/29/2016 2307   APPEARANCEUR CLEAR 05/29/2016 2307   LABSPEC 1.042 (H) 05/29/2016 2307   PHURINE 6.5 05/29/2016 2307   GLUCOSEU >1000 (A) 05/29/2016 2307   HGBUR NEGATIVE 05/29/2016 2307   BILIRUBINUR NEGATIVE 05/29/2016 2307   KETONESUR 15 (A) 05/29/2016 2307   PROTEINUR NEGATIVE 05/29/2016 2307   NITRITE NEGATIVE 05/29/2016 2307   LEUKOCYTESUR NEGATIVE 05/29/2016 2307    Sepsis Labs: '@LABRCNTIP'$ (procalcitonin:4,lacticidven:4) )No results found for this or any previous visit (from the past 240 hour(s)).   Radiological Exams on Admission: Ct Abdomen Pelvis W Contrast  Result Date: 05/30/2016 CLINICAL DATA:  Severe right-sided abdominal pain. Colonoscopy 5 days ago. EXAM: CT ABDOMEN AND PELVIS WITH CONTRAST TECHNIQUE: Multidetector CT imaging of the abdomen and pelvis was performed using the standard protocol following bolus administration of intravenous contrast. CONTRAST:  116m ISOVUE-300 IOPAMIDOL (ISOVUE-300) INJECTION 61% COMPARISON:  None. FINDINGS: Lower chest: Mild linear right lung base opacities, probably atelectatic. No consolidation in the bases. No effusions. Hepatobiliary: Mild fatty infiltration of the liver without focal lesion. Probable gallbladder calculi. Moderate gallbladder mural thickening and pericholecystic inflammatory stranding. No bile duct dilatation.  Pancreas: Unremarkable. No pancreatic ductal dilatation or surrounding inflammatory changes. Spleen: Normal in size without focal abnormality. Adrenals/Urinary Tract: Adrenal glands are unremarkable. No renal parenchymal lesions. 3  mm midpole left renal collecting system calculus. Ureters and urinary bladder are unremarkable. Stomach/Bowel: Stomach is within normal limits. Appendix appears normal. No evidence of bowel wall thickening, distention, or inflammatory changes. Vascular/Lymphatic: Aortic atherosclerosis. No enlarged abdominal or pelvic lymph nodes. Reproductive: Prostate is unremarkable. Other: Small fat containing umbilical hernia.  No ascites. Musculoskeletal: No fracture is seen. IMPRESSION: Cholelithiasis with mild gallbladder mural thickening and pericholecystic inflammatory stranding. This may represent acute cholecystitis. No bile duct dilatation. Electronically Signed   By: Andreas Newport M.D.   On: 05/30/2016 04:38    EKG: Independently reviewed.  Assessment/Plan Active Problems:   Cholecystitis   Pure hypercholesterolemia   Generalized anxiety disorder   Narcotic dependence (HCC)   Chronic pain   Depression   Anxiety   Uncontrolled type 2 diabetes mellitus with nephropathy (HCC)    Acute cholecystitis. As evidenced by Abd CT without stone in bile duct or mass  Lipase 19  WBC 38. Alk Phos 34   -admit to Med Surg -keep NPO until seen by Surgery.   IVF while NPO.  Preop  CXR, Tn, EKG  CBC Pain meds as per Pharmacy. Consult was called   Leukocytosis, likely reactive  WBC 38  Tmax 99.6  ,  Zosyn IV   IVF   Repeat CBC in AM Cultures  Continue antibiotics  Antipyretics prn   Type II Diabetes Current blood sugar level is 256 Lab Results  Component Value Date   HGBA1C 8.4 (H) 03/01/2016   Hgb A1C Hold home oral diabetic medications.   SSI Heart healthy carb modified diet.   Hyperlipidemia Continue home statins  Opioid dependence for chronic back pain    Pharmacy to consult for pain management  Continue methadone as per Pharmacy   Anxiety Continue home Paxil    DVT prophylaxis SCD's   Code Status:   Full    Family Communication:  Discussed with patient Disposition Plan: Expect patient to be discharged to home after condition improves Consults called:    None Admission status:  Medsurg    Tylan Kinn E, PA-C [facility]   If 7PM-7AM, please contact night-coverage [email or web address] Password [email or web address]  05/30/2016, 7:35 AM

## 2016-05-30 NOTE — Progress Notes (Signed)
Patient ID: Martin Downs, male   DOB: 05/30/1966, 50 y.o.   MRN: [id number]  I examined the patient.  Will plan laparoscopic cholecystectomy with IOC/ possible open cholecystectomy/ umbilical hernia repair this morning.  The surgical procedure has been discussed with the patient.  Potential risks, benefits, alternative treatments, and expected outcomes have been explained.  All of the patient's questions at this time have been answered.  The likelihood of reaching the patient's treatment goal is good.  The patient understand the proposed surgical procedure and wishes to proceed.  [name] Skainssuei, MD, [facility]  General/ Trauma Surgery  05/30/2016 8:16 AM

## 2016-05-30 NOTE — Consult Note (Signed)
Surgical Consultation Requesting provider: Dr. Jola Schmidt  CC: abdominal pain  HPI: 50yo gentleman presents with abdominal pain which began yesterday afternoon. The pain is on the right side and radiates from back just under the shoulder blade to the right abdomen. He describes it as a "knot", not cramping or burning. Associated nausea. Denies fever, chills, emesis, or diarrhea. No prior similar symptoms. Of note, a screening colonoscopy was attempted on 10/27 but stool burden caused the procedure to be aborted before ascending past the rectum. He remains constipated and has tried a suppository with minimal relief.   He takes '180mg'$  of methadone daily- chronic bilateral knee pain. History of diabetes. 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  Allergies  Allergen Reactions  . Gemfibrozil     REACTION: diarrhea    Past Medical History:  Diagnosis Date  . Anxiety   . Borderline diabetes   . Chronic pain   . Depression   . Diabetes mellitus without complication ([place])   . Hyperlipidemia   . Narcotic dependence ([place])   . Sleep apnea    no cpap   Last HbA1C 8.4 (on March 01 2016)  Past Surgical History:  Procedure Laterality Date  . removal of bone spur Bilateral 1995   elbps  . TOOTH EXTRACTION  2017    Family History  Problem Relation Age of Onset  . Heart disease Father   . Cancer Father     unsure what kind  . Colon cancer Neg Hx     Social History   Social History  . Marital status: Married    Spouse name: N/A  . Number of children: Y  . Years of education: N/A   Occupational History  . Dealer Unemployed   Social History Main Topics  . Smoking status: Never Smoker  . Smokeless tobacco: Current User    Types: Snuff  . Alcohol use No  . Drug use: No  . Sexual activity: Not Asked   Other Topics Concern  . None   Social History Narrative  . None    Current Facility-Administered Medications on File Prior to Encounter  Medication Dose Route Frequency  Provider Last Rate Last Dose  . 0.9 %  sodium chloride infusion  500 mL Intravenous Continuous Milus Banister, MD       Current Outpatient Prescriptions on File Prior to Encounter  Medication Sig Dispense Refill  . aspirin 81 MG tablet Take 81 mg by mouth daily.      . B-D INS SYRINGE 0.5CC/31GX5/16 31G X 5/16" 0.5 ML MISC USE 3 TIMES DAILY 100 each 5  . Blood Glucose Monitoring Suppl (ONE TOUCH ULTRA MINI) W/DEVICE KIT Use as directed to test blood sugar twice daily 250.00 1 each 0  . FARXIGA 10 MG TABS tablet TAKE 1 TABLET BY MOUTH NIGHTLY 30 tablet 3  . fenofibrate 160 MG tablet TAKE 1 TABLET (160 MG TOTAL) BY MOUTH DAILY. 90 tablet 1  . fluticasone (FLONASE) 50 MCG/ACT nasal spray PLACE 2 SPRAYS INTO BOTH NOSTRILS DAILY. 16 g 3  . Insulin Detemir (LEVEMIR FLEXPEN) 100 UNIT/ML Pen Inject 30 Units into the skin every morning. 15 mL 2  . Insulin Glargine (BASAGLAR KWIKPEN) 100 UNIT/ML SOPN Inject 0.15 mLs (15 Units total) into the skin at bedtime. 5 pen 2  . Insulin Pen Needle 32G X 4 MM MISC Use to inject insulin 1 time daily as instructed. 200 each 2  . LORazepam (ATIVAN) 0.5 MG tablet Take 2 tablets (1 mg total)  by mouth once. Before MRI 4 tablet 0  . metFORMIN (GLUCOPHAGE) 1000 MG tablet TAKE 1 TABLET (1,000 MG TOTAL) BY MOUTH 2 (TWO) TIMES DAILY WITH A MEAL. 180 tablet 0  . methadone (DOLOPHINE) 10 MG/ML solution Take 180 mg by mouth daily.     . Multiple Vitamin (MULTIVITAMIN) tablet Take 2 tablets by mouth 2 (two) times daily.    . ONE TOUCH ULTRA TEST test strip USE 8 TIMES A DAY AS INSTRUCTED 300 each 5  . ONETOUCH DELICA LANCETS 40J MISC USE TO TEST BLOOD SUGAR 4 TIMES DAILY AS INSTRUCTED. 200 each 4  . PARoxetine (PAXIL) 30 MG tablet Take 1 tablet (30 mg total) by mouth daily. 30 tablet 3  . rosuvastatin (CRESTOR) 10 MG tablet     . sildenafil (VIAGRA) 100 MG tablet Take 0.5-1 tablets (50-100 mg total) by mouth daily as needed for erectile dysfunction. 5 tablet 11  .  SYRINGE-NEEDLE, DISP, 3 ML 18G X 1-1/2" 3 ML MISC Use to inject testosterone. 100 each 1  . TANZEUM 50 MG PEN INJECT 1 SYRINGEGUL ('50MG'$ ) UNDER SKIN ONCE A WEEK 4 each 2  . testosterone cypionate (DEPOTESTOSTERONE CYPIONATE) 200 MG/ML injection INJECT 0.25 MILLILITERS INTRAMUSCULARLY ONCE PER WEEK 10 mL 2  . vitamin B-12 (CYANOCOBALAMIN) 1000 MCG tablet Take 1,000 mcg by mouth daily.      Review of Systems: a complete, 10pt review of systems was completed with pertinent positives and negatives as documented in the HPI.   Physical Exam: Vitals:   05/30/16 0147 05/30/16 0349  BP: 133/63 155/80  Pulse: 89 100  Resp: 20 17  Temp: 99.3 F (37.4 C)    Gen: no distress but clearly uncomfortable H&N: normocephalic, atraumatic, EOMI, anicteric.  Neck supple without mass or thyromegaly Chest: unlabored respirations clear to auscultation bilaterally CV: RRR with palpable distal pulses Abdomen: Soft, mildly distended, reducible small umbilical hernia. Tender along right hemiabdomen and epigastrium with voluntary guarding in the right upper quadrant Extremities: warm, without edema, no deformities, normal gait Neuro: grossly intact,  Oriented x 4, no sensory or motor deficits Psych: appropriate mood and affect   CBC Latest Ref Rng & Units 05/29/2016 10/18/2015 11/03/2011  WBC 4.0 - 10.5 K/uL 38.3(H) 10.9(H) -  Hemoglobin 13.0 - 17.0 g/dL 16.7 15.0 12.9(L)  Hematocrit 39.0 - 52.0 % 49.1 45.6 38.0(L)  Platelets 150 - 400 K/uL 259 266.0 -    CMP Latest Ref Rng & Units 05/29/2016 03/01/2016 01/16/2016  Glucose 65 - 99 mg/dL 256(H) 155(H) 134(H)  BUN 6 - 20 mg/dL '10 16 11  '$ Creatinine 0.61 - 1.24 mg/dL 1.07 1.18 1.18  Sodium 135 - 145 mmol/L 134(L) 141 140  Potassium 3.5 - 5.1 mmol/L 4.8 5.0 4.6  Chloride 101 - 111 mmol/L 96(L) 100 102  CO2 22 - 32 mmol/L 25 34(H) 29  Calcium 8.9 - 10.3 mg/dL 10.3 10.3 9.4  Total Protein 6.5 - 8.1 g/dL 6.9 7.0 -  Total Bilirubin 0.3 - 1.2 mg/dL 2.0(H) 1.1 -   Alkaline Phos 38 - 126 U/L 34(L) 32(L) -  AST 15 - 41 U/L 24 18 -  ALT 17 - 63 U/L 25 24 -    No results found for: INR, PROTIME  Imaging: Ct a/p: "Cholelithiasis with mild gallbladder mural thickening and pericholecystic inflammatory stranding. This may represent acute cholecystitis. No bile duct dilatation." There is also a notable stool burden throughout much of the colon, while the rectum looks fairly decompressed. The pericholecystic inflammation abuts the hepatic  flexure.   A/P: 50yo male with h/o dabetes and chronic opioid dependence who presents with abdominal/back pain, leukocytosis, and CT findings concerning for cholecystitis -NPO, limited ice chips OK -IVF resuscitation -Symptomatic relief/ breakthrough pain meds + nausea relief -IV antibiotics -Plan lap chole this admission.   Romana Juniper, MD [facility], [place] Pager [phone]

## 2016-05-30 NOTE — ED Provider Notes (Signed)
[facility]-EMERGENCY DEPT Provider Note   CSN: [id number] Arrival date & time: 05/29/16  2222  By signing my name below, I, Diana Omoyeni, attest that this documentation has been prepared under the direction and in the presence of  , MD . Electronically Signed: Diana Omoyeni, Scribe. 05/30/2016. 3:49 AM.    History   Chief Complaint Chief Complaint  Patient presents with  . Abdominal Pain   The history is provided by the patient. No language interpreter was used.    HPI Comments:  Martin Downs is a 50 y.o. male who presents to the Emergency Department complaining of constant, sharp, right sided abdominal pain since 05/28/16. He rates his pain a 9/10 at this time. He notes the pain started under his rght shoulder blade and then radiated to the right abdomen and has not moved since.  Pt reports associated nausea. He denies fever, chills, vomiting, and diarrhea. He also denies h/o similar symptoms and h/o kidney stones. No alleviating factors noted. Pt began to have a routine colonoscopy on 05/25/16  but was unable to complete it due to poor prep. No h/o abdominal surgery.   Past Medical History:  Diagnosis Date  . Anxiety   . Borderline diabetes   . Chronic pain   . Depression   . Diabetes mellitus without complication (HCC)   . Hyperlipidemia   . Narcotic dependence (HCC)   . Sleep apnea    no cpap    Patient Active Problem List   Diagnosis Date Noted  . Erectile dysfunction 03/13/2016  . Premature ejaculation 03/13/2016  . Hypogonadotropic hypogonadism (HCC) 05/13/2015  . Hyperprolactinemia (HCC) 05/13/2015  . Elevated liver function tests 06/28/2014  . Uncontrolled type 2 diabetes mellitus with nephropathy (HCC) 05/31/2014  . Family history of premature CAD 02/11/2014  . DOE (dyspnea on exertion) 02/11/2014  . Hypersomnia 11/23/2011  . Anxiety 11/05/2011  . Depression 08/23/2011  . Chronic pain 06/27/2011  . Narcotic dependence (HCC) 11/16/2010  .  GENERALIZED ANXIETY DISORDER 07/26/2010  . Hypothyroidism 01/17/2010  . HERPES LABIALIS 11/03/2009  . PURE HYPERCHOLESTEROLEMIA 05/26/2009  . THYROID STIMULATING HORMONE, ABNORMAL 04/06/2009  . FATIGUE 03/30/2009  . TOBACCO ABUSE 12/30/2008  . HLD (hyperlipidemia) 12/04/2006  . GOUT 12/04/2006  . INSOMNIA 12/04/2006    Past Surgical History:  Procedure Laterality Date  . removal of bone spur Bilateral 1995   elbps  . TOOTH EXTRACTION  2017       Home Medications    Prior to Admission medications   Medication Sig Start Date End Date Taking? Authorizing Provider  aspirin 81 MG tablet Take 81 mg by mouth daily.      Historical Provider, MD  B-D INS SYRINGE 0.5CC/31GX5/16 31G X 5/16" 0.5 ML MISC USE 3 TIMES DAILY 07/26/15   Cristina Gherghe, MD  Blood Glucose Monitoring Suppl (ONE TOUCH ULTRA MINI) W/DEVICE KIT Use as directed to test blood sugar twice daily 250.00 04/29/14   Talia M Aron, MD  FARXIGA 10 MG TABS tablet TAKE 1 TABLET BY MOUTH NIGHTLY 05/16/16   Cristina Gherghe, MD  fenofibrate 160 MG tablet TAKE 1 TABLET (160 MG TOTAL) BY MOUTH DAILY. 01/09/16   Talia M Aron, MD  fluticasone (FLONASE) 50 MCG/ACT nasal spray PLACE 2 SPRAYS INTO BOTH NOSTRILS DAILY. 02/09/16   Talia M Aron, MD  Insulin Detemir (LEVEMIR FLEXPEN) 100 UNIT/ML Pen Inject 30 Units into the skin every morning. 01/19/16   Cristina Gherghe, MD  Insulin Glargine (BASAGLAR KWIKPEN) 100 UNIT/ML SOPN Inject 0.15 mLs (  15 Units total) into the skin at bedtime. 03/26/16   Philemon Kingdom, MD  Insulin Pen Needle 32G X 4 MM MISC Use to inject insulin 1 time daily as instructed. 01/19/16   Philemon Kingdom, MD  LORazepam (ATIVAN) 0.5 MG tablet Take 2 tablets (1 mg total) by mouth once. Before MRI 07/01/15   Philemon Kingdom, MD  metFORMIN (GLUCOPHAGE) 1000 MG tablet TAKE 1 TABLET (1,000 MG TOTAL) BY MOUTH 2 (TWO) TIMES DAILY WITH A MEAL. 02/10/16   Philemon Kingdom, MD  methadone (DOLOPHINE) 10 MG/ML solution Take 180 mg by  mouth daily.     Historical Provider, MD  Multiple Vitamin (MULTIVITAMIN) tablet Take 2 tablets by mouth 2 (two) times daily.    Historical Provider, MD  ONE TOUCH ULTRA TEST test strip USE 8 TIMES A DAY AS INSTRUCTED 07/26/15   Philemon Kingdom, MD  [facility] DELICA LANCETS 44H MISC USE TO TEST BLOOD SUGAR 4 TIMES DAILY AS INSTRUCTED. 10/24/15   Philemon Kingdom, MD  PARoxetine (PAXIL) 30 MG tablet Take 1 tablet (30 mg total) by mouth daily. 04/12/16   Lucille Passy, MD  rosuvastatin (CRESTOR) 10 MG tablet  03/12/16   Historical Provider, MD  sildenafil (VIAGRA) 100 MG tablet Take 0.5-1 tablets (50-100 mg total) by mouth daily as needed for erectile dysfunction. 03/13/16   Lucille Passy, MD  SYRINGE-NEEDLE, DISP, 3 ML 18G X 1-1/2" 3 ML MISC Use to inject testosterone. 07/19/15   Philemon Kingdom, MD  TANZEUM 50 MG PEN INJECT 1 SYRINGEGUL (50MG) UNDER SKIN ONCE A WEEK 05/16/16   Philemon Kingdom, MD  testosterone cypionate (DEPOTESTOSTERONE CYPIONATE) 200 MG/ML injection INJECT 0.25 MILLILITERS INTRAMUSCULARLY ONCE PER WEEK 02/10/16   Philemon Kingdom, MD  vitamin B-12 (CYANOCOBALAMIN) 1000 MCG tablet Take 1,000 mcg by mouth daily.    Historical Provider, MD    Family History Family History  Problem Relation Age of Onset  . Heart disease Father   . Cancer Father     unsure what kind  . Colon cancer Neg Hx     Social History Social History  Substance Use Topics  . Smoking status: Never Smoker  . Smokeless tobacco: Current User    Types: Snuff  . Alcohol use No     Allergies   Gemfibrozil   Review of Systems Review of Systems 10 systems reviewed and all are negative for acute change except as noted in the HPI.    Physical Exam Updated Vital Signs BP 133/63 (BP Location: Left Arm)   Pulse 89   Temp 99.3 F (37.4 C) (Oral)   Resp 20   Ht 5' 9" (1.753 m)   Wt 220 lb (99.8 kg)   SpO2 97%   BMI 32.49 kg/m   Physical Exam  Constitutional: He is oriented to person, place, and  time. He appears well-developed and well-nourished.  HENT:  Head: Normocephalic and atraumatic.  Eyes: EOM are normal.  Neck: Normal range of motion.  Cardiovascular: Normal rate, regular rhythm, normal heart sounds and intact distal pulses.   Pulmonary/Chest: Effort normal and breath sounds normal. No respiratory distress.  Abdominal: Soft. He exhibits no distension. There is tenderness.  Severe right sided tenderness with peritonitis    Musculoskeletal: Normal range of motion.  Neurological: He is alert and oriented to person, place, and time.  Skin: Skin is warm and dry.  Psychiatric: He has a normal mood and affect. Judgment normal.  Nursing note and vitals reviewed.    ED Treatments / Results  DIAGNOSTIC STUDIES:  Oxygen Saturation is 97% on RA, normal by my interpretation.    COORDINATION OF CARE:  3:47 AM Discussed treatment plan with pt at bedside and pt agreed to plan.  Labs (all labs ordered are listed, but only abnormal results are displayed) Labs Reviewed  COMPREHENSIVE METABOLIC PANEL - Abnormal; Notable for the following:       Result Value   Sodium 134 (*)    Chloride 96 (*)    Glucose, Bld 256 (*)    Alkaline Phosphatase 34 (*)    Total Bilirubin 2.0 (*)    All other components within normal limits  CBC - Abnormal; Notable for the following:    WBC 38.3 (*)    All other components within normal limits  URINALYSIS, ROUTINE W REFLEX MICROSCOPIC (NOT AT [facility]) - Abnormal; Notable for the following:    Specific Gravity, Urine 1.042 (*)    Glucose, UA >1000 (*)    Ketones, ur 15 (*)    All other components within normal limits  URINE MICROSCOPIC-ADD ON - Abnormal; Notable for the following:    Squamous Epithelial / LPF 0-5 (*)    Bacteria, UA RARE (*)    All other components within normal limits  LIPASE, BLOOD    EKG  EKG Interpretation None       Radiology Ct Abdomen Pelvis W Contrast  Result Date: 05/30/2016 CLINICAL DATA:  Severe right-sided  abdominal pain. Colonoscopy 5 days ago. EXAM: CT ABDOMEN AND PELVIS WITH CONTRAST TECHNIQUE: Multidetector CT imaging of the abdomen and pelvis was performed using the standard protocol following bolus administration of intravenous contrast. CONTRAST:  120m ISOVUE-300 IOPAMIDOL (ISOVUE-300) INJECTION 61% COMPARISON:  None. FINDINGS: Lower chest: Mild linear right lung base opacities, probably atelectatic. No consolidation in the bases. No effusions. Hepatobiliary: Mild fatty infiltration of the liver without focal lesion. Probable gallbladder calculi. Moderate gallbladder mural thickening and pericholecystic inflammatory stranding. No bile duct dilatation. Pancreas: Unremarkable. No pancreatic ductal dilatation or surrounding inflammatory changes. Spleen: Normal in size without focal abnormality. Adrenals/Urinary Tract: Adrenal glands are unremarkable. No renal parenchymal lesions. 3 mm midpole left renal collecting system calculus. Ureters and urinary bladder are unremarkable. Stomach/Bowel: Stomach is within normal limits. Appendix appears normal. No evidence of bowel wall thickening, distention, or inflammatory changes. Vascular/Lymphatic: Aortic atherosclerosis. No enlarged abdominal or pelvic lymph nodes. Reproductive: Prostate is unremarkable. Other: Small fat containing umbilical hernia.  No ascites. Musculoskeletal: No fracture is seen. IMPRESSION: Cholelithiasis with mild gallbladder mural thickening and pericholecystic inflammatory stranding. This may represent acute cholecystitis. No bile duct dilatation. Electronically Signed   By: DAndreas NewportM.D.   On: 05/30/2016 04:38    Procedures Procedures (including critical care time)  Medications Ordered in ED Medications  ondansetron (ZOFRAN-ODT) disintegrating tablet 8 mg (8 mg Oral Given 05/29/16 2245)  oxyCODONE-acetaminophen (PERCOCET/ROXICET) 5-325 MG per tablet 1 tablet (1 tablet Oral Given 05/29/16 2246)     Initial Impression /  Assessment and Plan / ED Course  I have reviewed the triage vital signs and the nursing notes.  Pertinent labs & imaging results that were available during my care of the patient were reviewed by me and considered in my medical decision making (see chart for details).  Clinical Course   Early antibiotics for severe right-sided abdominal pain.  CT scan concerning for cholelithiasis with associated cholecystitis.  IV Zosyn given.  Patient will be admitted to general surgery.  Final Clinical Impressions(s) / ED Diagnoses   Final diagnoses:  None  New Prescriptions New Prescriptions   No medications on file   I personally performed the services described in this documentation, which was scribed in my presence. The recorded information has been reviewed and is accurate.         , MD 05/30/16 0445  

## 2016-05-30 NOTE — Op Note (Signed)
Laparoscopic Cholecystectomy with IOC/ open common bile duct exploration/ placement of biliary T-tube, umbilical hernia repair  Indications: This patient presents with symptomatic gallbladder disease and will undergo laparoscopic cholecystectomy.  He also has a chronic umbilical hernia  Pre-operative Diagnosis: Calculus of gallbladder with acute cholecystitis  Post-operative Diagnosis: Gangrenous cholecystitis/ proximal common bile duct injury  Surgeon: Youcef Klas K.   Assistants: Feliciana RossettiLuke Kinsinger, MD  Anesthesia: General endotracheal anesthesia  ASA Class: 2E  Procedure Details  The patient was seen again in the Holding Room. The risks, benefits, complications, treatment options, and expected outcomes were discussed with the patient. The possibilities of reaction to medication, pulmonary aspiration, perforation of viscus, bleeding, recurrent infection, finding a normal gallbladder, the need for additional procedures, failure to diagnose a condition, the possible need to convert to an open procedure, and creating a complication requiring transfusion or operation were discussed with the patient. The likelihood of improving the patient's symptoms with return to their baseline status is good.  The patient and/or family concurred with the proposed plan, giving informed consent. The site of surgery properly noted. The patient was taken to Operating Room, identified as Martin Downs and the procedure verified as Laparoscopic Cholecystectomy with Intraoperative Cholangiogram. A Time Out was held and the above information confirmed.  Prior to the induction of general anesthesia, antibiotic prophylaxis was administered. General endotracheal anesthesia was then administered and tolerated well. After the induction, the abdomen was prepped with Chloraprep and draped in the sterile fashion. The patient was positioned in the supine position.  Local anesthetic agent was injected into the skin near the  umbilicus and an incision made. We dissected down to the umbilical hernia sac with blunt dissection.  The hernia was opened and we entered the peritoneal cavity.  A pursestring suture of 0-Vicryl was placed around the fascial opening.  The Hasson cannula was inserted and secured with the stay suture.  Pneumoperitoneum was then created with CO2 and tolerated well without any adverse changes in the patient's vital signs. An 11-mm port was placed in the subxiphoid position.  Two 5-mm ports were placed in the right upper quadrant. All skin incisions were infiltrated with a local anesthetic agent before making the incision and placing the trocars.   We positioned the patient in reverse Trendelenburg, tilted slightly to the patient's left.  The omentum was densely adherent to the fundus of the gallbladder. We bluntly dissected the omentum away. The gallbladder wall is very inflamed with patches of gangrenous necrosis. There is no sign of perforation yet. The gallbladder was very distended we're unable to grasp the gallbladder. We decompressed the gallbladder with the suction aspirator and were then able to grasp the gallbladder. The gallbladder was identified, the fundus grasped and retracted cephalad. Adhesions were lysed bluntly and with the electrocautery where indicated, taking care not to injure any adjacent organs or viscus. Dissection was very difficult due to the edema and inflammation. There was a lot of oozing with our blunt dissection. Finally, the cystic duct was clearly identified and bluntly dissected circumferentially. The cystic duct was ligated with a clip distally.   An incision was made in the cystic duct and the [facility] chol[facility]ogram catheter introduced. The catheter was secured using a clip. A cholangiogram was then obtained which showed good visualization of the distal and proximal biliary tree with no sign of filling defects or obstruction.  Contrast flowed easily into the duodenum. The catheter was  then removed.   The cystic duct was then ligated with  clips and divided. The cystic artery was identified, dissected free, ligated with clips and divided as well.   The gallbladder was dissected from the liver bed in retrograde fashion with the electrocautery.  again, dissection was extremely difficult due to the amount of inflammation and distention of the gallbladder.The gallbladder was removed and placed in an Endocatch sac. The liver bed was irrigated and inspected. Hemostasis was achieved with the electrocautery. Copious irrigation was utilized and was repeatedly aspirated until clear.   We inspected the gallbladder bed and noted a small area of leaking bile. We carefully examined this area and we felt that this may represent a injury to the common bile duct above the cystic duct common bile duct junction.  We then converted to an open procedure. We removed our trochars and released insufflation. We made a right subcostal incision and entered the peritoneum. We used a Bookwalter retractor for exposure. We were able to identify the area of the common bile duct containing the injury. I could pass a fine-tipped clamp proximally and distally. We used a 7014 French T-tube that was brought through our lateral port site. We cut the T-tube to the appropriate size. With considerable difficulty we were able to pass the limbs of the T-tube proximal and distally the biliary tree. The tube was secured within the common bile duct with a 4-0 PDS suture that was used to tighten the ductotomy around the T-tube.  We then obtained a T-tube cholangiogram that should good flow proximally and distally with no sign of obstruction or leak. A 19 French drain was placed through the other right upper quadrant port site and placed gallbladder bed. We irrigated the abdomen thoroughly. Some Surgicel snow was packed into the gallbladder fossa.  We again inspected the right upper quadrant for hemostasis.  The fascia was reapproximated with  two layers of 0 PDS.  The umbilical hernia was repaired with interrupted 1 Novofil.  Both skin incisions were closed with staples.  The drain was placed to bulb suction and the t-tube was connected to gravity drainage in a bile bag. The patient was then extubated and brought to the recovery room in stable condition. Instrument, sponge, and needle counts were correct at closure and at the conclusion of the case.   Findings: Gangrenous Cholecystitis with Cholelithiasis/ lateral common bile duct injury  Estimated Blood Loss: 200 mL         Drains: T-tube in common bile duct/ drain in GB fossa         Specimens: Gallbladder           Complications: CBD injury - recognized and managed         Disposition: PACU - hemodynamically stable.         Condition: stable  [name] Skainssuei, MD, [facility]  General/ Trauma Surgery  05/30/2016 1:16 PM

## 2016-05-30 NOTE — Anesthesia Preprocedure Evaluation (Signed)
Anesthesia Evaluation  Patient identified by MRN, date of birth, ID band Patient awake    Reviewed: Allergy & Precautions, H&P , NPO status , Patient's Chart, lab work & pertinent test results  Airway Mallampati: II   Neck ROM: full    Dental   Pulmonary sleep apnea ,    breath sounds clear to auscultation       Cardiovascular negative cardio ROS   Rhythm:regular Rate:Normal     Neuro/Psych PSYCHIATRIC DISORDERS Anxiety Depression    GI/Hepatic   Endo/Other  diabetes, Type 2Hypothyroidism obese  Renal/GU      Musculoskeletal   Abdominal   Peds  Hematology   Anesthesia Other Findings   Reproductive/Obstetrics                             Anesthesia Physical Anesthesia Plan  ASA: II  Anesthesia Plan: General   Post-op Pain Management:    Induction: Intravenous  Airway Management Planned: Oral ETT  Additional Equipment:   Intra-op Plan:   Post-operative Plan: Extubation in OR  Informed Consent: I have reviewed the patients History and Physical, chart, labs and discussed the procedure including the risks, benefits and alternatives for the proposed anesthesia with the patient or authorized representative who has indicated his/her understanding and acceptance.     Plan Discussed with: CRNA, Anesthesiologist and Surgeon  Anesthesia Plan Comments:         Anesthesia Quick Evaluation

## 2016-05-30 NOTE — Progress Notes (Signed)
Pt returned to 6N30 from PACU via bed.  Pt on 4L O2 via [place].  Pt has 18G to lt hand with fluids infusing and full dose dilaudid PCA.  Pt has surgical dsg to abd X 2 C/D/I.  To the rt side, pt has JP drain with bloody drainage and biliary drain with green drainage.  SCDs in place.  Pt due to void.  Report rcvd from Onalee Huaavid, [place].  Will continue to monitor.

## 2016-05-30 NOTE — Anesthesia Postprocedure Evaluation (Signed)
Anesthesia Post Note  Patient: Martin Downs  Procedure(s) Performed: Procedure(s) (LRB): LAPAROSCOPIC CHOLECYSTECTOMY WITH INTRAOPERATIVE CHOLANGIOGRAM (N/A) OPEN COMMON BILE DUCT EXPLORATION; INSERTION OF T-TUBE (N/A) HERNIA REPAIR UMBILICAL ADULT (N/A)  Patient location during evaluation: PACU Anesthesia Type: General Level of consciousness: awake and alert and patient cooperative Pain management: pain level controlled Vital Signs Assessment: post-procedure vital signs reviewed and stable Respiratory status: spontaneous breathing and respiratory function stable Cardiovascular status: stable Anesthetic complications: no    Last Vitals:  Vitals:   05/30/16 1318 05/30/16 1330  BP:    Pulse:  (!) 105  Resp:  (!) 32  Temp: 37.4 C     Last Pain:  Vitals:   05/30/16 1318  TempSrc:   PainSc: 10-Worst pain ever                 Darnise Montag S

## 2016-05-30 NOTE — Transfer of Care (Signed)
Immediate Anesthesia Transfer of Care Note  Patient: Martin Downs  Procedure(s) Performed: Procedure(s): LAPAROSCOPIC CHOLECYSTECTOMY WITH INTRAOPERATIVE CHOLANGIOGRAM (N/A) OPEN COMMON BILE DUCT EXPLORATION; INSERTION OF T-TUBE (N/A) HERNIA REPAIR UMBILICAL ADULT (N/A)  Patient Location: PACU  Anesthesia Type:General  Level of Consciousness: awake, alert  and pateint uncooperative  Airway & Oxygen Therapy: Patient Spontanous Breathing and Patient connected to face mask oxygen  Post-op Assessment: Report given to RN, Post -op Vital signs reviewed and stable and Patient moving all extremities X 4  Post vital signs: Reviewed and stable  Last Vitals:  Vitals:   05/30/16 0802 05/30/16 1245  BP: (!) 141/65 (!) (P) 151/72  Pulse: 97   Resp:    Temp: 37.8 C     Last Pain:  Vitals:   05/30/16 0814  TempSrc:   PainSc: 8          Complications: No apparent anesthesia complications

## 2016-05-30 NOTE — Progress Notes (Signed)
Notified MD pt states Dilaudid PCA is not controlling his pain.  No new orders.  Will continue to monitor.

## 2016-05-30 NOTE — ED Notes (Signed)
Attempted report x1.  Name and callback number provided.   

## 2016-05-30 NOTE — Anesthesia Procedure Notes (Signed)
Procedure Name: Intubation Date/Time: 05/30/2016 9:24 AM Performed by: Dairl PonderJIANG, Konnar Ben Pre-anesthesia Checklist: Patient identified, Emergency Drugs available, Suction available, Patient being monitored and Timeout performed Patient Re-evaluated:Patient Re-evaluated prior to inductionOxygen Delivery Method: Circle system utilized Preoxygenation: Pre-oxygenation with 100% oxygen Intubation Type: IV induction Ventilation: Mask ventilation without difficulty, Oral airway inserted - appropriate to patient size and Two handed mask ventilation required Laryngoscope Size: Glidescope and 4 Grade View: Grade II Tube type: Oral Tube size: 7.5 mm Number of attempts: 1 Airway Equipment and Method: Stylet Placement Confirmation: ETT inserted through vocal cords under direct vision,  positive ETCO2 and breath sounds checked- equal and bilateral Secured at: 23 cm Tube secured with: Tape Dental Injury: Teeth and Oropharynx as per pre-operative assessment  Difficulty Due To: Difficulty was anticipated

## 2016-05-30 NOTE — ED Notes (Signed)
Pt had colonoscopy Friday morning.  Pain started last night.  Pt took suppository last night for constipation.  Unable to complete colonoscopy due to fecal matter in colon.  9/10 abdominal pain, R side, starts in back and goes to front.  Pt has nausea.

## 2016-05-31 ENCOUNTER — Encounter (HOSPITAL_COMMUNITY): Payer: Self-pay | Admitting: Surgery

## 2016-05-31 DIAGNOSIS — A419 Sepsis, unspecified organism: Principal | ICD-10-CM

## 2016-05-31 DIAGNOSIS — K810 Acute cholecystitis: Secondary | ICD-10-CM

## 2016-05-31 DIAGNOSIS — K838 Other specified diseases of biliary tract: Secondary | ICD-10-CM

## 2016-05-31 DIAGNOSIS — E7800 Pure hypercholesterolemia, unspecified: Secondary | ICD-10-CM

## 2016-05-31 HISTORY — DX: Other specified diseases of biliary tract: K83.8

## 2016-05-31 HISTORY — DX: Acute cholecystitis: K81.0

## 2016-05-31 LAB — CBC
HEMATOCRIT: 43 % (ref 39.0–52.0)
HEMOGLOBIN: 14.5 g/dL (ref 13.0–17.0)
MCH: 30.6 pg (ref 26.0–34.0)
MCHC: 33.7 g/dL (ref 30.0–36.0)
MCV: 90.7 fL (ref 78.0–100.0)
Platelets: 232 10*3/uL (ref 150–400)
RBC: 4.74 MIL/uL (ref 4.22–5.81)
RDW: 14 % (ref 11.5–15.5)
WBC: 24.6 10*3/uL — ABNORMAL HIGH (ref 4.0–10.5)

## 2016-05-31 LAB — HEMOGLOBIN A1C
Hgb A1c MFr Bld: 8.4 % — ABNORMAL HIGH (ref 4.8–5.6)
MEAN PLASMA GLUCOSE: 194 mg/dL

## 2016-05-31 LAB — COMPREHENSIVE METABOLIC PANEL
ALBUMIN: 3 g/dL — AB (ref 3.5–5.0)
ALT: 107 U/L — ABNORMAL HIGH (ref 17–63)
ANION GAP: 8 (ref 5–15)
AST: 123 U/L — AB (ref 15–41)
Alkaline Phosphatase: 42 U/L (ref 38–126)
BUN: 22 mg/dL — AB (ref 6–20)
CHLORIDE: 102 mmol/L (ref 101–111)
CO2: 25 mmol/L (ref 22–32)
Calcium: 8.3 mg/dL — ABNORMAL LOW (ref 8.9–10.3)
Creatinine, Ser: 1.03 mg/dL (ref 0.61–1.24)
GFR calc Af Amer: >60 mL/min (ref >60)
GFR calc non Af Amer: >60 mL/min (ref >60)
GLUCOSE: 197 mg/dL — AB (ref 65–99)
POTASSIUM: 4 mmol/L (ref 3.5–5.1)
SODIUM: 135 mmol/L (ref 135–145)
TOTAL PROTEIN: 6.1 g/dL — AB (ref 6.5–8.1)
Total Bilirubin: 1 mg/dL (ref 0.3–1.2)

## 2016-05-31 LAB — GLUCOSE, CAPILLARY
GLUCOSE-CAPILLARY: 175 mg/dL — AB (ref 65–99)
GLUCOSE-CAPILLARY: 196 mg/dL — AB (ref 65–99)
Glucose-Capillary: 165 mg/dL — ABNORMAL HIGH (ref 65–99)
Glucose-Capillary: 169 mg/dL — ABNORMAL HIGH (ref 65–99)

## 2016-05-31 MED ORDER — LACTATED RINGERS IV BOLUS (SEPSIS): 1000.0000 mL | Freq: Three times a day (TID) | INTRAVENOUS | Status: AC | PRN

## 2016-05-31 MED ORDER — ALUM & MAG HYDROXIDE-SIMETH 200-200-20 MG/5ML PO SUSP: 30.0000 mL | Freq: Four times a day (QID) | ORAL | Status: DC | PRN

## 2016-05-31 MED ORDER — MAGIC MOUTHWASH: 15.0000 mL | Freq: Four times a day (QID) | ORAL | Status: DC | PRN

## 2016-05-31 MED ORDER — KETOROLAC TROMETHAMINE 30 MG/ML IJ SOLN
30.0000 mg | Freq: Three times a day (TID) | INTRAMUSCULAR | Status: AC
  Administered 2016-05-31 – 2016-06-02 (×9): 30 mg via INTRAVENOUS
  Filled 2016-05-31 (×9): qty 1

## 2016-05-31 MED ORDER — METHOCARBAMOL 1000 MG/10ML IJ SOLN
1000.0000 mg | Freq: Once | INTRAVENOUS | Status: AC
  Administered 2016-05-31: 1000 mg via INTRAVENOUS
  Filled 2016-05-31: qty 10

## 2016-05-31 MED ORDER — FENTANYL 25 MCG/HR TD PT72
25.0000 ug | MEDICATED_PATCH | TRANSDERMAL | Status: DC
  Administered 2016-05-31: 25 ug via TRANSDERMAL
  Filled 2016-05-31: qty 1

## 2016-05-31 MED ORDER — INSULIN DETEMIR 100 UNIT/ML ~~LOC~~ SOLN
6.0000 [IU] | Freq: Every day | SUBCUTANEOUS | Status: DC
  Administered 2016-05-31: 6 [IU] via SUBCUTANEOUS
  Filled 2016-05-31 (×2): qty 0.06

## 2016-05-31 MED ORDER — HYDROMORPHONE HCL 1 MG/ML IJ SOLN: 1.0000 mg | INTRAMUSCULAR | Status: DC | PRN

## 2016-05-31 MED ORDER — METHOCARBAMOL 1000 MG/10ML IJ SOLN
1000.0000 mg | Freq: Four times a day (QID) | INTRAVENOUS | Status: DC | PRN
  Administered 2016-05-31 – 2016-06-04 (×9): 1000 mg via INTRAVENOUS
  Filled 2016-05-31 (×18): qty 10

## 2016-05-31 MED ORDER — INSULIN DETEMIR 100 UNIT/ML ~~LOC~~ SOLN
12.0000 [IU] | Freq: Every day | SUBCUTANEOUS | Status: DC
  Administered 2016-06-01 – 2016-06-08 (×8): 12 [IU] via SUBCUTANEOUS
  Filled 2016-05-31 (×8): qty 0.12

## 2016-05-31 MED ORDER — LIP MEDEX EX OINT
1.0000 "application " | TOPICAL_OINTMENT | Freq: Two times a day (BID) | CUTANEOUS | Status: DC
  Filled 2016-05-31: qty 7

## 2016-05-31 MED ORDER — METOPROLOL TARTRATE 5 MG/5ML IV SOLN: 5.0000 mg | Freq: Four times a day (QID) | INTRAVENOUS | Status: DC | PRN

## 2016-05-31 MED ORDER — PROCHLORPERAZINE EDISYLATE 5 MG/ML IJ SOLN
5.0000 mg | INTRAMUSCULAR | Status: DC | PRN
  Filled 2016-05-31: qty 2

## 2016-05-31 MED ORDER — HYDROMORPHONE HCL 2 MG/ML IJ SOLN
2.0000 mg | INTRAMUSCULAR | Status: DC | PRN
  Administered 2016-05-31 – 2016-06-05 (×20): 2 mg via INTRAVENOUS
  Filled 2016-05-31 (×21): qty 1

## 2016-05-31 MED ORDER — MENTHOL 3 MG MT LOZG: 1.0000 | LOZENGE | OROMUCOSAL | Status: DC | PRN

## 2016-05-31 MED ORDER — PHENOL 1.4 % MT LIQD: 2.0000 | OROMUCOSAL | Status: DC | PRN

## 2016-05-31 MED ORDER — BLISTEX MEDICATED EX OINT
TOPICAL_OINTMENT | Freq: Two times a day (BID) | CUTANEOUS | Status: DC
  Administered 2016-05-31 – 2016-06-02 (×4): via TOPICAL
  Administered 2016-06-02: 1 via TOPICAL
  Administered 2016-06-03 – 2016-06-05 (×3): via TOPICAL
  Administered 2016-06-06: 1 via TOPICAL
  Administered 2016-06-07 – 2016-06-08 (×3): via TOPICAL
  Filled 2016-05-31: qty 6.3

## 2016-05-31 MED ORDER — BISACODYL 10 MG RE SUPP
10.0000 mg | Freq: Two times a day (BID) | RECTAL | Status: DC | PRN
  Administered 2016-06-02 – 2016-06-03 (×2): 10 mg via RECTAL
  Filled 2016-05-31: qty 1

## 2016-05-31 NOTE — Progress Notes (Signed)
Inpatient Diabetes Program Recommendations  AACE/ADA: New Consensus Statement on Inpatient Glycemic Control (2015)  Target Ranges:  Prepandial:   less than 140 mg/dL      Peak postprandial:   less than 180 mg/dL (1-2 hours)      Critically ill patients:  140 - 180 mg/dL   Lab Results  Component Value Date   GLUCAP 165 (H) 05/31/2016   HGBA1C 8.4 (H) 05/30/2016    Review of Glycemic Control:   Results for Martin Downs, Martin Downs (MRN [id number]) as of 05/31/2016 11:05  Ref. Range 05/30/2016 07:59 05/30/2016 12:47 05/30/2016 17:12 05/30/2016 21:39 05/31/2016 07:57  Glucose-Capillary Latest Ref Range: 65 - 99 mg/dL 409221 (H) 811242 (H) 914200 (H) 200 (H) 165 (H)   Diabetes history: Type 2 diabetes Outpatient Diabetes medications: Basaglar 15 units daily started by Dr. Elvera LennoxGherghe in August 2017 (to titrate up by 5 units q 4 days up to dose of 25 units), Pt. Told to stop taking Levemir   Current orders for Inpatient glycemic control:  Levemir 6 units daily, Novolog sensitive tid with meals  Inpatient Diabetes Program Recommendations:   Please consider increasing Levemir to 12 units daily.   Thanks, Beryl MeagerJenny Abbrielle Batts, RN, BC-ADM Inpatient Diabetes Coordinator Pager [phone] (8a-5p)

## 2016-05-31 NOTE — Progress Notes (Signed)
1 Day Post-Op  Subjective: He continues to complain of severe pain. Moaning and very uncomfortable. Hyperventilating, sats are 89% on the PCA monitor. Abdomen is distended and tender. The drainage from the T-tube bag is full drainage from the JP is serosanguineous. He is on a Dilaudid PCA, Toradol 1 dose yesterday, Robaxin one dose yesterday  Objective: Vital signs in last 24 hours: Temp:  [97.8 F (36.6 C)-99.5 F (37.5 C)] 98.3 F (36.8 C) (11/02 0130) Pulse Rate:  [98-108] 98 (11/02 0130) Resp:  [21-36] 23 (11/02 0444) BP: (133-162)/(65-87) 150/87 (11/02 0130) SpO2:  [90 %-97 %] 92 % (11/02 0444)  600 PO - clears 3088 IV 875 urine recorded Afebrile  CMP stable,  Mild ALT/AST elevation, glucose 197 WBC down to 24.6K  Intake/Output from previous day: 11/01 0701 - 11/02 0700 In: 3788.3 [P.O.:600; I.V.:3088.3; IV Piggyback:100] Out: 1420 [Urine:875; Drains:345; Blood:200] Intake/Output this shift: No intake/output data recorded.  General appearance: alert, cooperative and moderate distress Resp: clear to auscultation bilaterally and He is hyperventilating, he does not want to move, small short breaths. Sats at the bedside monitor are 89%. GI: Abdomen distended no bowel sounds. He is extremely uncomfortable. T-tube drainage bag is full. JP is serosanguineous and clear.  Lab Results:   Recent Labs  05/30/16 1610 05/31/16 0532  WBC 30.0* 24.6*  HGB 15.0 14.5  HCT 44.2 43.0  PLT 223 232    BMET  Recent Labs  05/29/16 2246 05/30/16 1610 05/31/16 0532  NA 134*  --  135  K 4.8  --  4.0  CL 96*  --  102  CO2 25  --  25  GLUCOSE 256*  --  197*  BUN 10  --  22*  CREATININE 1.07 1.13 1.03  CALCIUM 10.3  --  8.3*   PT/INR  Recent Labs  05/30/16 0753  LABPROT 16.8*  INR 1.35     Recent Labs Lab 05/29/16 2246 05/31/16 0532  AST 24 123*  ALT 25 107*  ALKPHOS 34* 42  BILITOT 2.0* 1.0  PROT 6.9 6.1*  ALBUMIN 4.1 3.0*     Lipase     Component Value  Date/Time   LIPASE 19 05/29/2016 2246     Studies/Results: Dg Cholangiogram Operative  Result Date: 05/30/2016 CLINICAL DATA:  Cholecystitis EXAM: INTRAOPERATIVE CHOLANGIOGRAM TECHNIQUE: Cholangiographic images from the C-arm fluoroscopic device were submitted for interpretation post-operatively. Please see the procedural report for the amount of contrast and the fluoroscopy time utilized. COMPARISON:  None. FINDINGS: Contrast fills the duodenum and biliary tree without filling defects in the common bile duct. Contrast fills the pancreatic duct. IMPRESSION: Patent biliary tree. Electronically Signed   By: Marybelle Killings M.D.   On: 05/30/2016 12:01   Ct Abdomen Pelvis W Contrast  Result Date: 05/30/2016 CLINICAL DATA:  Severe right-sided abdominal pain. Colonoscopy 5 days ago. EXAM: CT ABDOMEN AND PELVIS WITH CONTRAST TECHNIQUE: Multidetector CT imaging of the abdomen and pelvis was performed using the standard protocol following bolus administration of intravenous contrast. CONTRAST:  130m ISOVUE-300 IOPAMIDOL (ISOVUE-300) INJECTION 61% COMPARISON:  None. FINDINGS: Lower chest: Mild linear right lung base opacities, probably atelectatic. No consolidation in the bases. No effusions. Hepatobiliary: Mild fatty infiltration of the liver without focal lesion. Probable gallbladder calculi. Moderate gallbladder mural thickening and pericholecystic inflammatory stranding. No bile duct dilatation. Pancreas: Unremarkable. No pancreatic ductal dilatation or surrounding inflammatory changes. Spleen: Normal in size without focal abnormality. Adrenals/Urinary Tract: Adrenal glands are unremarkable. No renal parenchymal lesions. 3 mm midpole  left renal collecting system calculus. Ureters and urinary bladder are unremarkable. Stomach/Bowel: Stomach is within normal limits. Appendix appears normal. No evidence of bowel wall thickening, distention, or inflammatory changes. Vascular/Lymphatic: Aortic atherosclerosis. No  enlarged abdominal or pelvic lymph nodes. Reproductive: Prostate is unremarkable. Other: Small fat containing umbilical hernia.  No ascites. Musculoskeletal: No fracture is seen. IMPRESSION: Cholelithiasis with mild gallbladder mural thickening and pericholecystic inflammatory stranding. This may represent acute cholecystitis. No bile duct dilatation. Electronically Signed   By: Andreas Newport M.D.   On: 05/30/2016 04:38   Prior to Admission medications   Medication Sig Start Date End Date Taking? Authorizing Provider  aspirin 81 MG tablet Take 81 mg by mouth daily.     Yes Historical Provider, MD  B-D INS SYRINGE 0.5CC/31GX5/16 31G X 5/16" 0.5 ML MISC USE 3 TIMES DAILY 07/26/15  Yes Philemon Kingdom, MD  Blood Glucose Monitoring Suppl (ONE TOUCH ULTRA MINI) W/DEVICE KIT Use as directed to test blood sugar twice daily 250.00 04/29/14  Yes Lucille Passy, MD  FARXIGA 10 MG TABS tablet TAKE 1 TABLET BY MOUTH NIGHTLY 05/16/16  Yes Philemon Kingdom, MD  fenofibrate 160 MG tablet TAKE 1 TABLET (160 MG TOTAL) BY MOUTH DAILY. 01/09/16  Yes Lucille Passy, MD  fluticasone (FLONASE) 50 MCG/ACT nasal spray PLACE 2 SPRAYS INTO BOTH NOSTRILS DAILY. 02/09/16  Yes Lucille Passy, MD  Insulin Detemir (LEVEMIR FLEXPEN) 100 UNIT/ML Pen Inject 30 Units into the skin every morning. Patient taking differently: Inject 25 Units into the skin every morning.  01/19/16  Yes Philemon Kingdom, MD  Insulin Glargine (BASAGLAR KWIKPEN) 100 UNIT/ML SOPN Inject 0.15 mLs (15 Units total) into the skin at bedtime. 03/26/16  Yes Philemon Kingdom, MD  Insulin Pen Needle 32G X 4 MM MISC Use to inject insulin 1 time daily as instructed. 01/19/16  Yes Philemon Kingdom, MD  metFORMIN (GLUCOPHAGE) 1000 MG tablet TAKE 1 TABLET (1,000 MG TOTAL) BY MOUTH 2 (TWO) TIMES DAILY WITH A MEAL. 02/10/16  Yes Philemon Kingdom, MD  methadone (DOLOPHINE) 10 MG/ML solution Take 180 mg by mouth daily.    Yes Historical Provider, MD  Multiple Vitamin (MULTIVITAMIN)  tablet Take 2 tablets by mouth 2 (two) times daily.   Yes Historical Provider, MD  ONE TOUCH ULTRA TEST test strip USE 8 TIMES A DAY AS INSTRUCTED 07/26/15  Yes Philemon Kingdom, MD  [facility] DELICA LANCETS 03B MISC USE TO TEST BLOOD SUGAR 4 TIMES DAILY AS INSTRUCTED. 10/24/15  Yes Philemon Kingdom, MD  PARoxetine (PAXIL) 30 MG tablet Take 1 tablet (30 mg total) by mouth daily. 04/12/16  Yes Lucille Passy, MD  rosuvastatin (CRESTOR) 10 MG tablet Take 10 mg by mouth daily.  03/12/16  Yes Historical Provider, MD  sildenafil (VIAGRA) 100 MG tablet Take 0.5-1 tablets (50-100 mg total) by mouth daily as needed for erectile dysfunction. 03/13/16  Yes Lucille Passy, MD  SYRINGE-NEEDLE, DISP, 3 ML 18G X 1-1/2" 3 ML MISC Use to inject testosterone. 07/19/15  Yes Philemon Kingdom, MD  TANZEUM 50 MG PEN INJECT 1 SYRINGEGUL (50MG) UNDER SKIN ONCE A WEEK 05/16/16  Yes Philemon Kingdom, MD  testosterone cypionate (DEPOTESTOSTERONE CYPIONATE) 200 MG/ML injection INJECT 0.25 MILLILITERS INTRAMUSCULARLY ONCE PER WEEK 02/10/16  Yes Philemon Kingdom, MD  vitamin B-12 (CYANOCOBALAMIN) 1000 MCG tablet Take 1,000 mcg by mouth daily.   Yes Historical Provider, MD     Medications: . enoxaparin (LOVENOX) injection  40 mg Subcutaneous Q24H  . HYDROmorphone   Intravenous Q4H  .  insulin aspart  0-9 Units Subcutaneous TID WC  . insulin detemir  6 Units Subcutaneous Daily  . methadone  180 mg Oral QHS  . methocarbamol (ROBAXIN)  IV  1,000 mg Intravenous Once  . PARoxetine  30 mg Oral Daily  . piperacillin-tazobactam (ZOSYN)  IV  3.375 g Intravenous Q8H   . sodium chloride 100 mL/hr at 05/31/16 0600    Assessment/Plan Gangrenous cholecystitis/ proximal common bile duct injury S/p Laparoscopic Cholecystectomy with IOC/ open common bile duct exploration/ placement of biliary T-tube, umbilical hernia repair, 05/30/16, Dr. Georgette Dover  POD 1   Chronic pain  - Home dose Methadone 180 mg daily for chronic knee pain AODM - Insulin  dependent Sleep apnea Anxiety/depression FEN: IV fluids/ clear liquids ID: Zosyn day 3 DVT:  Lovenox   Plan: Currently pain control is the biggest issue. Dr. Hulen Skains has ordered some Robaxin IV which hasn't been given yet, and I just restarted the Toradol, 30 mg every 8h. His home dose of methadone 180 mg was given last evening. I'll also add a low-dose fentanyl patch 25 g to start today. We'll continue to monitor his renal function closely with the ongoing NSAID.   LOS: 1 day    Martin Downs 05/31/2016 [phone]

## 2016-05-31 NOTE — Progress Notes (Signed)
[facility] PROGRESS NOTE  Osmani Kersten XVQ:[id number] DOB: 08-06-1965 DOA: 05/30/2016 PCP: [name], MD  Interim summary and HPI 50 y.o. male with medical history significant for DM, HLD, chronic back pain opioid dependent on methadone, presenting to the ED with Right abdominal pain since 10/30 at night, accompanied by nausea without vomiting, with radiation to the right shoulder and back, poor oral intake. His pain continues to be increasingly worse this morning. Denies any similar symptoms in the past. Denies subjective fevers, chills, night sweats, vision changes, or mucositis. Denies any respiratory complaints. Denies any chest pain or palpitations. Denies lower extremity swelling. Denies change in bowel habits. Last bowel movement on 10/31  Denies any dysuria. Denies abnormal skin rashes, or neuropathy. Denies any bleeding issues such as epistaxis, hematemesis, hematuria or hematochezia. Ambulating without difficulty. No ETOH or tobacco .   Assessment/Plan: Sepsis due to Acute gangrenous cholecystitis. (met sepsis criteria on admission with RR of 26-27, elevated WBC's, HR in 110, low grade temp and findings of cholecystitis in CT abdomen) -S/P laparoscopic cholecystectomy and placement of T-tube and JP drain -WBC's trending down -continue Zosyn -diet advancement as per general surgery rec's -biggest challenge will be pain management given chronic opiates use and high tolerance/resistance  -will follow response and continue supportive care  Umbilical hernia -status post surgical repair -will continue PRN analgesics   Leukocytosis,  Due to gangrenous cholecystitis  Will continue zosyn  Follow WBC's trend and response (trending down appropriately) Afebrile currently   Type II Diabetes: uncontrolled with hyperglycemia Will follow A1C Hold home oral diabetic medications.  Will continue SSI and levemir (dose of last one adjusted for bette CBG  control)  Hyperlipidemia Continue home statins  Opioid dependence for chronic back pain  Continue methadone  Also on dilaudid PCA and with breakthrough Receiving fentanyl patch, IV toradol and robaxin    Anxiety Continue home Paxil    Code Status: Full Family Communication: no family at bedside  Disposition Plan: to be determine; but imagine home when medically stable.   Consultants:  General surgery   Procedures:  See below for x-ray reports   Laparoscopic cholecystectomy with JP and T-tube placement 05/30/16  Antibiotics:  Zosyn 05/30/16  HPI/Subjective: Diaphoretic, with shallow breaths and very uncomfortable. Reports having excruciating pain.  Objective: Vitals:   05/31/16 1300 05/31/16 1310  BP:  (!) 154/98  Pulse:  (!) 110  Resp: (!) 27   Temp:  98 F (36.7 C)    Intake/Output Summary (Last 24 hours) at 05/31/16 1633 Last data filed at 05/31/16 1328  Gross per 24 hour  Intake             1870 ml  Output             2225 ml  Net             -355 ml   Filed Weights   05/29/16 2233  Weight: 99.8 kg (220 lb)    Exam:   General:  Complaining of intractable pain; patient is diaphoretic and in acute distress.  Cardiovascular: tachycardic, no rubs, no gallops  Respiratory: good air movement, no wheezing or crackles on exam  Abdomen: distended, tender to palpation and with minimal to absent BS; JP and T-tube drainage in place, positive serosanguineous and biliary appearance fluid collected   Musculoskeletal: no edema, no cyanosis   Data Reviewed: Basic Metabolic Panel:  Recent Labs Lab 05/29/16 2246 05/30/16 1610 05/31/16 0532  NA 134*  --  135  K 4.8  --  4.0  CL 96*  --  102  CO2 25  --  25  GLUCOSE 256*  --  197*  BUN 10  --  22*  CREATININE 1.07 1.13 1.03  CALCIUM 10.3  --  8.3*   Liver Function Tests:  Recent Labs Lab 05/29/16 2246 05/31/16 0532  AST 24 123*  ALT 25 107*  ALKPHOS 34* 42  BILITOT 2.0* 1.0  PROT 6.9  6.1*  ALBUMIN 4.1 3.0*    Recent Labs Lab 05/29/16 2246  LIPASE 19   CBC:  Recent Labs Lab 05/29/16 2246 05/30/16 1610 05/31/16 0532  WBC 38.3* 30.0* 24.6*  HGB 16.7 15.0 14.5  HCT 49.1 44.2 43.0  MCV 89.6 89.5 90.7  PLT 259 223 232   Cardiac Enzymes:  Recent Labs Lab 05/30/16 0753  TROPONINI 0.05*   CBG:  Recent Labs Lab 05/30/16 1247 05/30/16 1712 05/30/16 2139 05/31/16 0757 05/31/16 1230  GLUCAP 242* 200* 200* 165* 169*    Studies: Dg Cholangiogram Operative  Result Date: 05/30/2016 CLINICAL DATA:  Cholecystitis EXAM: INTRAOPERATIVE CHOLANGIOGRAM TECHNIQUE: Cholangiographic images from the C-arm fluoroscopic device were submitted for interpretation post-operatively. Please see the procedural report for the amount of contrast and the fluoroscopy time utilized. COMPARISON:  None. FINDINGS: Contrast fills the duodenum and biliary tree without filling defects in the common bile duct. Contrast fills the pancreatic duct. IMPRESSION: Patent biliary tree. Electronically Signed   By: Marybelle Killings M.D.   On: 05/30/2016 12:01   Ct Abdomen Pelvis W Contrast  Result Date: 05/30/2016 CLINICAL DATA:  Severe right-sided abdominal pain. Colonoscopy 5 days ago. EXAM: CT ABDOMEN AND PELVIS WITH CONTRAST TECHNIQUE: Multidetector CT imaging of the abdomen and pelvis was performed using the standard protocol following bolus administration of intravenous contrast. CONTRAST:  183m ISOVUE-300 IOPAMIDOL (ISOVUE-300) INJECTION 61% COMPARISON:  None. FINDINGS: Lower chest: Mild linear right lung base opacities, probably atelectatic. No consolidation in the bases. No effusions. Hepatobiliary: Mild fatty infiltration of the liver without focal lesion. Probable gallbladder calculi. Moderate gallbladder mural thickening and pericholecystic inflammatory stranding. No bile duct dilatation. Pancreas: Unremarkable. No pancreatic ductal dilatation or surrounding inflammatory changes. Spleen: Normal in  size without focal abnormality. Adrenals/Urinary Tract: Adrenal glands are unremarkable. No renal parenchymal lesions. 3 mm midpole left renal collecting system calculus. Ureters and urinary bladder are unremarkable. Stomach/Bowel: Stomach is within normal limits. Appendix appears normal. No evidence of bowel wall thickening, distention, or inflammatory changes. Vascular/Lymphatic: Aortic atherosclerosis. No enlarged abdominal or pelvic lymph nodes. Reproductive: Prostate is unremarkable. Other: Small fat containing umbilical hernia.  No ascites. Musculoskeletal: No fracture is seen. IMPRESSION: Cholelithiasis with mild gallbladder mural thickening and pericholecystic inflammatory stranding. This may represent acute cholecystitis. No bile duct dilatation. Electronically Signed   By: DAndreas NewportM.D.   On: 05/30/2016 04:38    Scheduled Meds: . enoxaparin (LOVENOX) injection  40 mg Subcutaneous Q24H  . fentaNYL  25 mcg Transdermal Q72H  . HYDROmorphone   Intravenous Q4H  . insulin aspart  0-9 Units Subcutaneous TID WC  . [START ON 06/01/2016] insulin detemir  12 Units Subcutaneous Daily  . ketorolac  30 mg Intravenous Q8H  . lip balm   Topical BID  . methadone  180 mg Oral QHS  . PARoxetine  30 mg Oral Daily  . piperacillin-tazobactam (ZOSYN)  IV  3.375 g Intravenous Q8H   Continuous Infusions: . sodium chloride 50 mL/hr at 05/31/16 1016    Principal Problem:   Acute gangrenous  cholecystitis s/p cholecystectomy 05/30/2016 Active Problems:   Pure hypercholesterolemia   Generalized anxiety disorder   Narcotic dependence ([place])   Chronic pain   Depression   Anxiety   Uncontrolled type 2 diabetes mellitus with nephropathy ([place])   Cholecystitis   Common bile duct repair with T-Tube 11/192017    Time spent: 25 minutes    Barton Dubois  [facility] Pager [phone]. If 7PM-7AM, please contact night-coverage at [email or web address], password [facility] 05/31/2016, 4:33 PM  LOS: 1 day

## 2016-06-01 LAB — CBC
HEMATOCRIT: 41.5 % (ref 39.0–52.0)
Hemoglobin: 13.8 g/dL (ref 13.0–17.0)
MCH: 30.2 pg (ref 26.0–34.0)
MCHC: 33.3 g/dL (ref 30.0–36.0)
MCV: 90.8 fL (ref 78.0–100.0)
Platelets: 252 10*3/uL (ref 150–400)
RBC: 4.57 MIL/uL (ref 4.22–5.81)
RDW: 13.9 % (ref 11.5–15.5)
WBC: 20.8 10*3/uL — ABNORMAL HIGH (ref 4.0–10.5)

## 2016-06-01 LAB — BASIC METABOLIC PANEL
Anion gap: 6 (ref 5–15)
BUN: 24 mg/dL — ABNORMAL HIGH (ref 6–20)
CALCIUM: 7.9 mg/dL — AB (ref 8.9–10.3)
CO2: 27 mmol/L (ref 22–32)
CREATININE: 0.94 mg/dL (ref 0.61–1.24)
Chloride: 100 mmol/L — ABNORMAL LOW (ref 101–111)
GFR calc non Af Amer: >60 mL/min (ref >60)
GLUCOSE: 168 mg/dL — AB (ref 65–99)
Potassium: 4.2 mmol/L (ref 3.5–5.1)
Sodium: 133 mmol/L — ABNORMAL LOW (ref 135–145)

## 2016-06-01 LAB — GLUCOSE, CAPILLARY
GLUCOSE-CAPILLARY: 207 mg/dL — AB (ref 65–99)
GLUCOSE-CAPILLARY: 173 mg/dL — AB (ref 65–99)
Glucose-Capillary: 173 mg/dL — ABNORMAL HIGH (ref 65–99)
Glucose-Capillary: 195 mg/dL — ABNORMAL HIGH (ref 65–99)

## 2016-06-01 MED ORDER — METHOCARBAMOL 1000 MG/10ML IJ SOLN: 500.0000 mg | Freq: Three times a day (TID) | INTRAVENOUS | Status: DC | PRN

## 2016-06-01 MED ORDER — FENTANYL 12 MCG/HR TD PT72
12.5000 ug | MEDICATED_PATCH | Freq: Once | TRANSDERMAL | Status: AC
  Administered 2016-06-01: 12.5 ug via TRANSDERMAL
  Filled 2016-06-01: qty 1

## 2016-06-01 MED ORDER — FENTANYL 25 MCG/HR TD PT72
37.5000 ug | MEDICATED_PATCH | TRANSDERMAL | Status: DC
  Filled 2016-06-01: qty 1

## 2016-06-01 NOTE — Progress Notes (Signed)
2 Days Post-Op  Subjective: He is up in the chair, still complaining of hair amount of discomfort but less so than yesterday. He is tolerating the clear liquids well. He has had flatus couple times but nothing more.  Objective: Vital signs in last 24 hours: Temp:  [98 F (36.7 C)-98.7 F (37.1 C)] 98.4 F (36.9 C) (11/03 1052) Pulse Rate:  [82-110] 82 (11/03 1052) Resp:  [18-37] 18 (11/03 1052) BP: (144-158)/(81-98) 144/83 (11/03 1052) SpO2:  [87 %-95 %] 95 % (11/03 1052)  120 PO 814 IV 2100 urine Drain 1095 Afebrile, VSS RR stays up int the 20 -39 range  Labs OK WBC coming down  Intake/Output from previous day: 11/02 0701 - 11/03 0700 In: 1044.2 [P.O.:120; I.V.:814.2; IV Piggyback:110] Out: 3275 [Urine:2180; Drains:1095] Intake/Output this shift: Total I/O In: -  Out: 500 [Urine:500]  General appearance: alert, cooperative, no distress, mild distress and Ongoing abdominal distention and discomfort. Resp: clear to auscultation bilaterally GI: Abdomen remains distended and tympanic, as a good deal of ongoing pain. He is up in the chair dressings are clean and intact. He put out over a liter 3's T-tube bag yesterday. JP drain is serosanguineous.. few bowel sounds  Lab Results:   Recent Labs  05/31/16 0532 06/01/16 0442  WBC 24.6* 20.8*  HGB 14.5 13.8  HCT 43.0 41.5  PLT 232 252    BMET  Recent Labs  05/31/16 0532 06/01/16 0442  NA 135 133*  K 4.0 4.2  CL 102 100*  CO2 25 27  GLUCOSE 197* 168*  BUN 22* 24*  CREATININE 1.03 0.94  CALCIUM 8.3* 7.9*   PT/INR  Recent Labs  05/30/16 0753  LABPROT 16.8*  INR 1.35     Recent Labs Lab 05/29/16 2246 05/31/16 0532  AST 24 123*  ALT 25 107*  ALKPHOS 34* 42  BILITOT 2.0* 1.0  PROT 6.9 6.1*  ALBUMIN 4.1 3.0*     Lipase     Component Value Date/Time   LIPASE 19 05/29/2016 2246     Studies/Results: No results found.  Medications: . enoxaparin (LOVENOX) injection  40 mg Subcutaneous Q24H  .  fentaNYL  25 mcg Transdermal Q72H  . HYDROmorphone   Intravenous Q4H  . insulin aspart  0-9 Units Subcutaneous TID WC  . insulin detemir  12 Units Subcutaneous Daily  . ketorolac  30 mg Intravenous Q8H  . lip balm   Topical BID  . methadone  180 mg Oral QHS  . PARoxetine  30 mg Oral Daily  . piperacillin-tazobactam (ZOSYN)  IV  3.375 g Intravenous Q8H   . sodium chloride 50 mL/hr at 06/01/16 0908    Assessment/Plan Gangrenous cholecystitis/ proximal common bile duct injury S/p Laparoscopic Cholecystectomy with IOC/ open common bile duct exploration/ placement of biliary T-tube, umbilical hernia repair, 05/30/16, Dr. Corliss Skainssuei  POD 2   Chronic pain  - Home dose Methadone 180 mg daily for chronic knee pain - fentanyl patch 25 up to 37.5 Dilaudid PCA/Toradol/Robaxin AODM - Insulin dependent Sleep apnea Anxiety/depression FEN: IV fluids/ clear liquids ID: Zosyn day 3 DVT:  Lovenox  Plan: Continue clears for now he has a good deal of abdominal distention. Increase his fentanyl Duragesic slightly. Continue to mobilize. Increase his IV fluids secondary to the large amount of output through the T-tube.       LOS: 2 days    Alyjah Lovingood 06/01/2016 [phone]

## 2016-06-01 NOTE — Telephone Encounter (Signed)
Left message on machine to call back  

## 2016-06-01 NOTE — Progress Notes (Signed)
[facility] PROGRESS NOTE  Martin Downs [id number] DOB: April 24, 1966 DOA: 05/30/2016 PCP: [name], MD  Interim summary and HPI 50 y.o. male with medical history significant for DM, HLD, chronic back pain opioid dependent on methadone, presenting to the ED with Right abdominal pain since 10/30 at night, accompanied by nausea without vomiting, with radiation to the right shoulder and back, poor oral intake. His pain continues to be increasingly worse this morning. Denies any similar symptoms in the past. Denies subjective fevers, chills, night sweats, vision changes, or mucositis. Denies any respiratory complaints. Denies any chest pain or palpitations. Denies lower extremity swelling. Denies change in bowel habits. Last bowel movement on 10/31  Denies any dysuria. Denies abnormal skin rashes, or neuropathy. Denies any bleeding issues such as epistaxis, hematemesis, hematuria or hematochezia. Ambulating without difficulty. No ETOH or tobacco .   Assessment/Plan: Sepsis due to Acute gangrenous cholecystitis. (met sepsis criteria on admission with RR of 26-27, elevated WBC's, HR in 110, low grade temp and findings of cholecystitis in CT abdomen) -S/P laparoscopic cholecystectomy and placement of T-tube and JP drain -WBC's trending down appropriately  -continue Zosyn -diet advancement as per general surgery rec's -biggest challenge will be pain management given chronic opiates use and high tolerance/resistance; overall pain is better today  -will follow response and continue supportive care  Umbilical hernia -status post surgical repair -will continue PRN analgesics   Leukocytosis,  -Due to gangrenous cholecystitis  -Will continue zosyn  -Follow WBC's trend and response (trending down appropriately) -Afebrile currently   Type II Diabetes: uncontrolled with hyperglycemia A1C 8.4 Continue holding home oral diabetic medications.  Will continue SSI and levemir (dose of last one  adjusted to 12 units for better CBG control)  Hyperlipidemia Continue home statins  Opioid dependence for chronic back pain  Continue methadone  Also on dilaudid PCA and with breakthrough Receiving fentanyl patch, IV toradol and robaxin    Anxiety Continue home Paxil    Code Status: Full Family Communication: no family at bedside  Disposition Plan: to be determine; but imagine home when medically stable.   Consultants:  General surgery   Procedures:  See below for x-ray reports   Laparoscopic cholecystectomy with JP and T-tube placement 05/30/16  Antibiotics:  Zosyn 05/30/16  HPI/Subjective: Patient is less distress today; still complaining of significant abd pain. Passing gas, but no BM. no fever.   Objective: Vitals:   06/01/16 1201 06/01/16 1339  BP:  (!) 152/79  Pulse:  70  Resp: (!) 23 20  Temp:  98.6 F (37 C)    Intake/Output Summary (Last 24 hours) at 06/01/16 1507 Last data filed at 06/01/16 1442  Gross per 24 hour  Intake          1430.83 ml  Output             3075 ml  Net         -1644.17 ml   Filed Weights   05/29/16 2233  Weight: 99.8 kg (220 lb)    Exam:   General:  Still Complaining of of abd pain, but better today. Intermittent episodes of diaphoresis and tachypnea. In less distress overall. Belly still distended, tolerating CLD. No BM, but passing gas.  Cardiovascular: tachycardic, no rubs, no gallops  Respiratory: good air movement, no wheezing or crackles on exam  Abdomen: distended, still tender to palpation and with minimal to absent BS; JP and T-tube drainage in place, positive serosanguineous and biliary appearance fluid collected  Musculoskeletal: no edema, no cyanosis   Data Reviewed: Basic Metabolic Panel:  Recent Labs Lab 05/29/16 2246 05/30/16 1610 05/31/16 0532 06/01/16 0442  NA 134*  --  135 133*  K 4.8  --  4.0 4.2  CL 96*  --  102 100*  CO2 25  --  25 27  GLUCOSE 256*  --  197* 168*  BUN 10  --   22* 24*  CREATININE 1.07 1.13 1.03 0.94  CALCIUM 10.3  --  8.3* 7.9*   Liver Function Tests:  Recent Labs Lab 05/29/16 2246 05/31/16 0532  AST 24 123*  ALT 25 107*  ALKPHOS 34* 42  BILITOT 2.0* 1.0  PROT 6.9 6.1*  ALBUMIN 4.1 3.0*    Recent Labs Lab 05/29/16 2246  LIPASE 19   CBC:  Recent Labs Lab 05/29/16 2246 05/30/16 1610 05/31/16 0532 06/01/16 0442  WBC 38.3* 30.0* 24.6* 20.8*  HGB 16.7 15.0 14.5 13.8  HCT 49.1 44.2 43.0 41.5  MCV 89.6 89.5 90.7 90.8  PLT 259 223 232 252   Cardiac Enzymes:  Recent Labs Lab 05/30/16 0753  TROPONINI 0.05*   CBG:  Recent Labs Lab 05/31/16 1230 05/31/16 1720 05/31/16 2258 06/01/16 0755 06/01/16 1147  GLUCAP 169* 175* 196* 173* 195*    Studies: No results found.  Scheduled Meds: . enoxaparin (LOVENOX) injection  40 mg Subcutaneous Q24H  . [START ON 06/03/2016] fentaNYL  37.5 mcg Transdermal Q72H  . HYDROmorphone   Intravenous Q4H  . insulin aspart  0-9 Units Subcutaneous TID WC  . insulin detemir  12 Units Subcutaneous Daily  . ketorolac  30 mg Intravenous Q8H  . lip balm   Topical BID  . methadone  180 mg Oral QHS  . PARoxetine  30 mg Oral Daily  . piperacillin-tazobactam (ZOSYN)  IV  3.375 g Intravenous Q8H   Continuous Infusions: . sodium chloride 100 mL/hr at 06/01/16 1200    Principal Problem:   Acute gangrenous cholecystitis s/p cholecystectomy 05/30/2016 Active Problems:   Pure hypercholesterolemia   Generalized anxiety disorder   Narcotic dependence ([place])   Chronic pain   Depression   Anxiety   Uncontrolled type 2 diabetes mellitus with nephropathy ([place])   Cholecystitis   Common bile duct repair with T-Tube 11/192017    Time spent: 25 minutes    Barton Dubois  [facility] Pager [phone]. If 7PM-7AM, please contact night-coverage at [email or web address], password [facility] 06/01/2016, 3:07 PM  LOS: 2 days

## 2016-06-02 LAB — GLUCOSE, CAPILLARY
GLUCOSE-CAPILLARY: 106 mg/dL — AB (ref 65–99)
GLUCOSE-CAPILLARY: 194 mg/dL — AB (ref 65–99)
Glucose-Capillary: 164 mg/dL — ABNORMAL HIGH (ref 65–99)
Glucose-Capillary: 231 mg/dL — ABNORMAL HIGH (ref 65–99)

## 2016-06-02 MED ORDER — BISACODYL 10 MG RE SUPP
10.0000 mg | Freq: Once | RECTAL | Status: AC
  Administered 2016-06-02: 10 mg via RECTAL
  Filled 2016-06-02: qty 1

## 2016-06-02 MED ORDER — POLYETHYLENE GLYCOL 3350 17 G PO PACK
17.0000 g | PACK | Freq: Every day | ORAL | Status: DC
  Administered 2016-06-02 – 2016-06-06 (×4): 17 g via ORAL
  Filled 2016-06-02 (×7): qty 1

## 2016-06-02 NOTE — Progress Notes (Signed)
[facility] PROGRESS NOTE  Martin Downs [id number] DOB: Aug 31, 1965 DOA: 05/30/2016 PCP: [name], MD  Interim summary and HPI 50 y.o. male with medical history significant for DM, HLD, chronic back pain opioid dependent on methadone, presenting to the ED with Right abdominal pain since 10/30 at night, accompanied by nausea without vomiting, with radiation to the right shoulder and back, poor oral intake. His pain continues to be increasingly worse this morning. Denies any similar symptoms in the past. Denies subjective fevers, chills, night sweats, vision changes, or mucositis. Denies any respiratory complaints. Denies any chest pain or palpitations. Denies lower extremity swelling. Denies change in bowel habits. Last bowel movement on 10/31  Denies any dysuria. Denies abnormal skin rashes, or neuropathy. Denies any bleeding issues such as epistaxis, hematemesis, hematuria or hematochezia. Ambulating without difficulty. No ETOH or tobacco .   Assessment/Plan: Sepsis due to Acute gangrenous cholecystitis. (met sepsis criteria on admission with RR of 26-27, elevated WBC's, HR in 110, low grade temp and findings of cholecystitis in CT abdomen) -S/P laparoscopic cholecystectomy and placement of T-tube and JP drain -WBC's trending down appropriately  -still high outputs from T-tube -continue Zosyn -diet advancement as per general surgery rec's -biggest challenge will be pain management given chronic opiates use and high tolerance/resistance; overall pain is better today  -will follow response and continue supportive care  Umbilical hernia -status post surgical repair -will continue PRN analgesics   Leukocytosis,  -Due to gangrenous cholecystitis  -Will continue zosyn  -Follow WBC's trend and response (trending down appropriately) -Afebrile currently   Type II Diabetes: uncontrolled with hyperglycemia A1C 8.4 Continue holding home oral diabetic medications.  Will continue  SSI and levemir (dose of last one adjusted to 12 units for better CBG control)  Hyperlipidemia Continue home statins  Opioid dependence for chronic back pain  Continue methadone  Also on dilaudid PCA and with breakthrough Receiving fentanyl patch (dose adjusted for better pain control), IV toradol and robaxin    Anxiety Continue home Paxil    Code Status: Full Family Communication: no family at bedside  Disposition Plan: to be determine; but imagine home when medically stable.   Consultants:  General surgery   Procedures:  See below for x-ray reports   Laparoscopic cholecystectomy with JP and T-tube placement 05/30/16  Antibiotics:  Zosyn 05/30/16  HPI/Subjective: Patient is less distress today; still complaining of abd pain. Passing gas, but no BM yet.    Objective: Vitals:   06/02/16 1406 06/02/16 1545  BP: 140/72   Pulse: 66   Resp: 18 20  Temp: 97.4 F (36.3 C)     Intake/Output Summary (Last 24 hours) at 06/02/16 1937 Last data filed at 06/02/16 1433  Gross per 24 hour  Intake          1831.67 ml  Output             1531 ml  Net           300.67 ml   Filed Weights   05/29/16 2233  Weight: 99.8 kg (220 lb)    Exam:   General:  Still Complaining of of abd pain, but much better today. Significantly improvement in his  distress overall. Belly still distended and passing gas; no BM. Tolerating CLD.   Cardiovascular: tachycardic, no rubs, no gallops  Respiratory: good air movement, no wheezing or crackles on exam  Abdomen: distended, still tender to palpation and with minimal to absent BS; JP and T-tube drainage in place,  positive serosanguineous and biliary appearance fluid collected   Musculoskeletal: no edema, no cyanosis   Data Reviewed: Basic Metabolic Panel:  Recent Labs Lab 05/29/16 2246 05/30/16 1610 05/31/16 0532 06/01/16 0442  NA 134*  --  135 133*  K 4.8  --  4.0 4.2  CL 96*  --  102 100*  CO2 25  --  25 27  GLUCOSE 256*   --  197* 168*  BUN 10  --  22* 24*  CREATININE 1.07 1.13 1.03 0.94  CALCIUM 10.3  --  8.3* 7.9*   Liver Function Tests:  Recent Labs Lab 05/29/16 2246 05/31/16 0532  AST 24 123*  ALT 25 107*  ALKPHOS 34* 42  BILITOT 2.0* 1.0  PROT 6.9 6.1*  ALBUMIN 4.1 3.0*    Recent Labs Lab 05/29/16 2246  LIPASE 19   CBC:  Recent Labs Lab 05/29/16 2246 05/30/16 1610 05/31/16 0532 06/01/16 0442  WBC 38.3* 30.0* 24.6* 20.8*  HGB 16.7 15.0 14.5 13.8  HCT 49.1 44.2 43.0 41.5  MCV 89.6 89.5 90.7 90.8  PLT 259 223 232 252   Cardiac Enzymes:  Recent Labs Lab 05/30/16 0753  TROPONINI 0.05*   CBG:  Recent Labs Lab 06/01/16 1651 06/01/16 2113 06/02/16 0738 06/02/16 1126 06/02/16 1729  GLUCAP 207* 173* 164* 231* 106*    Studies: No results found.  Scheduled Meds: . enoxaparin (LOVENOX) injection  40 mg Subcutaneous Q24H  . fentaNYL  12.5 mcg Transdermal Once  . [START ON 06/03/2016] fentaNYL  37.5 mcg Transdermal Q72H  . HYDROmorphone   Intravenous Q4H  . insulin aspart  0-9 Units Subcutaneous TID WC  . insulin detemir  12 Units Subcutaneous Daily  . ketorolac  30 mg Intravenous Q8H  . lip balm   Topical BID  . methadone  180 mg Oral QHS  . PARoxetine  30 mg Oral Daily  . piperacillin-tazobactam (ZOSYN)  IV  3.375 g Intravenous Q8H  . polyethylene glycol  17 g Oral Daily   Continuous Infusions: . sodium chloride 100 mL/hr at 06/01/16 2247    Principal Problem:   Acute gangrenous cholecystitis s/p cholecystectomy 05/30/2016 Active Problems:   Pure hypercholesterolemia   Generalized anxiety disorder   Narcotic dependence ([place])   Chronic pain   Depression   Anxiety   Uncontrolled type 2 diabetes mellitus with nephropathy ([place])   Cholecystitis   Common bile duct repair with T-Tube 11/192017    Time spent: 25 minutes    Barton Dubois  [facility] Pager [phone]. If 7PM-7AM, please contact night-coverage at [email or web address], password  [facility] 06/02/2016, 7:37 PM  LOS: 3 days

## 2016-06-02 NOTE — Progress Notes (Signed)
3 Days Post-Op  Subjective: Pain okay, no burping or belching. Ambulated in the hall. Doesn't like the liquids. Hasn't had a bowel movement since Sunday. 2 L out of the T-tube  Objective: Vital signs in last 24 hours: Temp:  [98.2 F (36.8 C)-99 F (37.2 C)] 99 F (37.2 C) (11/04 0600) Pulse Rate:  [65-82] 69 (11/04 0600) Resp:  [18-27] 22 (11/04 0748) BP: (144-152)/(78-83) 152/81 (11/04 0600) SpO2:  [92 %-98 %] 95 % (11/04 0748) Last BM Date: 05/30/16  Intake/Output from previous day: 11/03 0701 - 11/04 0700 In: 3990 [P.O.:1670; I.V.:2150; IV Piggyback:170] Out: 2561 [Urine:500; Drains:2061] Intake/Output this shift: No intake/output data recorded.  Alert, no apparent distress Clear to auscultation Soft, some distention, protuberant, incisions-clean, dry, intact. Bile in T-tube drain; JP drain-serosanguineous  Lab Results:   Recent Labs  05/31/16 0532 06/01/16 0442  WBC 24.6* 20.8*  HGB 14.5 13.8  HCT 43.0 41.5  PLT 232 252   BMET  Recent Labs  05/31/16 0532 06/01/16 0442  NA 135 133*  K 4.0 4.2  CL 102 100*  CO2 25 27  GLUCOSE 197* 168*  BUN 22* 24*  CREATININE 1.03 0.94  CALCIUM 8.3* 7.9*   PT/INR No results for input(s): LABPROT, INR in the last 72 hours. ABG No results for input(s): PHART, HCO3 in the last 72 hours.  Invalid input(s): PCO2, PO2  Studies/Results: No results found.  Anti-infectives: Anti-infectives    Start     Dose/Rate Route Frequency Ordered Stop   05/30/16 1400  piperacillin-tazobactam (ZOSYN) IVPB 3.375 g     3.375 g 12.5 mL/hr over 240 Minutes Intravenous Every 8 hours 05/30/16 0557     11 /01/17 0400  piperacillin-tazobactam (ZOSYN) IVPB 3.375 g     3.375 g 100 mL/hr over 30 Minutes Intravenous  Once 05/30/16 0346 05/30/16 0542      Assessment/Plan: s/p Procedure(s): LAPAROSCOPIC CHOLECYSTECTOMY WITH INTRAOPERATIVE CHOLANGIOGRAM (N/A) OPEN COMMON BILE DUCT EXPLORATION; INSERTION OF T-TUBE (N/A) HERNIA REPAIR  UMBILICAL ADULT (N/A) Gangrenous cholecystitis/ proximal common bile duct injury S/p Laparoscopic Cholecystectomy with IOC/ open common bile duct exploration/ placement of biliary T-tube, umbilical hernia repair, 05/30/16, Dr. Corliss Skainssuei POD 3 Chronic pain - Home dose Methadone 180 mg daily for chronic knee pain - fentanyl patch 25 up to 37.5 Dilaudid PCA/Toradol/Robaxin; start bowel regimen given narcotics AODM - Insulin dependent Sleep apnea Anxiety/depression FEN: IV fluids/ full liquids; adv diet as tolerated today ID: Zosyn day 4 DVT: Lovenox  Mary SellaEric M. Andrey CampanileWilson, MD, FACS General, Bariatric, & Minimally Invasive Surgery [facility] [place]   LOS: 3 days    Atilano InaWILSON,Jaylanni Eltringham M 06/02/2016

## 2016-06-03 DIAGNOSIS — F411 Generalized anxiety disorder: Secondary | ICD-10-CM

## 2016-06-03 LAB — GLUCOSE, CAPILLARY
GLUCOSE-CAPILLARY: 133 mg/dL — AB (ref 65–99)
Glucose-Capillary: 147 mg/dL — ABNORMAL HIGH (ref 65–99)
Glucose-Capillary: 206 mg/dL — ABNORMAL HIGH (ref 65–99)
Glucose-Capillary: 161 mg/dL — ABNORMAL HIGH (ref 65–99)

## 2016-06-03 LAB — BASIC METABOLIC PANEL
ANION GAP: 5 (ref 5–15)
BUN: 14 mg/dL (ref 6–20)
CHLORIDE: 100 mmol/L — AB (ref 101–111)
CO2: 30 mmol/L (ref 22–32)
Calcium: 8.3 mg/dL — ABNORMAL LOW (ref 8.9–10.3)
Creatinine, Ser: 0.79 mg/dL (ref 0.61–1.24)
GFR calc Af Amer: >60 mL/min (ref >60)
Glucose, Bld: 152 mg/dL — ABNORMAL HIGH (ref 65–99)
POTASSIUM: 4.9 mmol/L (ref 3.5–5.1)
SODIUM: 135 mmol/L (ref 135–145)

## 2016-06-03 LAB — CBC
HCT: 40.8 % (ref 39.0–52.0)
HEMOGLOBIN: 13.5 g/dL (ref 13.0–17.0)
MCH: 30 pg (ref 26.0–34.0)
MCHC: 33.1 g/dL (ref 30.0–36.0)
MCV: 90.7 fL (ref 78.0–100.0)
PLATELETS: 276 10*3/uL (ref 150–400)
RBC: 4.5 MIL/uL (ref 4.22–5.81)
RDW: 13.8 % (ref 11.5–15.5)
WBC: 12.2 10*3/uL — AB (ref 4.0–10.5)

## 2016-06-03 NOTE — Progress Notes (Signed)
4 Days Post-Op  Subjective: Sore. Some pain overnight. Walked a lot. +flatus. Not much belching  Objective: Vital signs in last 24 hours: Temp:  [97.4 F (36.3 C)-98.9 F (37.2 C)] 97.8 F (36.6 C) (11/05 0554) Pulse Rate:  [58-66] 58 (11/05 0554) Resp:  [16-23] 16 (11/05 0818) BP: (128-153)/(63-88) 128/63 (11/05 0554) SpO2:  [92 %-99 %] 97 % (11/05 0818) Last BM Date: 06/01/16  Intake/Output from previous day: 11/04 0701 - 11/05 0700 In: 1960 [P.O.:600; I.V.:1200; IV Piggyback:160] Out: 705 [Drains:705] Intake/Output this shift: No intake/output data recorded.  Alert, sitting in chair cta ant Reg Soft, mild distension, incisions c/d/i; +BS T tube - bilious; jp - serosang  Lab Results:   Recent Labs  06/01/16 0442 06/03/16 0505  WBC 20.8* 12.2*  HGB 13.8 13.5  HCT 41.5 40.8  PLT 252 276   BMET  Recent Labs  06/01/16 0442 06/03/16 0505  NA 133* 135  K 4.2 4.9  CL 100* 100*  CO2 27 30  GLUCOSE 168* 152*  BUN 24* 14  CREATININE 0.94 0.79  CALCIUM 7.9* 8.3*   PT/INR No results for input(s): LABPROT, INR in the last 72 hours. ABG No results for input(s): PHART, HCO3 in the last 72 hours.  Invalid input(s): PCO2, PO2  Studies/Results: No results found.  Anti-infectives: Anti-infectives    Start     Dose/Rate Route Frequency Ordered Stop   05/30/16 1400  piperacillin-tazobactam (ZOSYN) IVPB 3.375 g     3.375 g 12.5 mL/hr over 240 Minutes Intravenous Every 8 hours 05/30/16 0557     05/30/16 0400  piperacillin-tazobactam (ZOSYN) IVPB 3.375 g     3.375 g 100 mL/hr over 30 Minutes Intravenous  Once 05/30/16 0346 05/30/16 0542      Assessment/Plan: s/p Procedure(s): LAPAROSCOPIC CHOLECYSTECTOMY WITH INTRAOPERATIVE CHOLANGIOGRAM (N/A) OPEN COMMON BILE DUCT EXPLORATION; INSERTION OF T-TUBE (N/A) HERNIA REPAIR UMBILICAL ADULT (N/A) Gangrenous cholecystitis/ proximal common bile duct injury S/p Laparoscopic Cholecystectomy with IOC/ open common bile  duct exploration/ placement of biliary T-tube, umbilical hernia repair, 05/30/16, Dr. Corliss Skainssuei POD 4; T tube output decreasing - 2L--705cc/24hr Chronic pain- Home dose Methadone 180 mg daily for chronic knee pain - fentanyl patch 25 up to 37.5 Dilaudid PCA/Toradol/Robaxin; start bowel regimen given narcotics; CAN START TO TRANSITION OFF OF PCA AODM - Insulin dependent Sleep apnea Anxiety/depression FEN:IV fluids/ diabetic diet; would cont a little mIVF until drain output lower ID: Zosyn day 5; would rec stopping zosyn after today DVT: Lovenox  Mary SellaEric M. Andrey CampanileWilson, MD, FACS General, Bariatric, & Minimally Invasive Surgery [facility] [place]   LOS: 4 days    Atilano InaWILSON,Grigor Lipschutz M 06/03/2016

## 2016-06-03 NOTE — Progress Notes (Signed)
[facility] PROGRESS NOTE  Martin Downs VQQ:[id number] DOB: 1966-07-16 DOA: 05/30/2016 PCP: [name], MD  Interim summary and HPI 50 y.o. male with medical history significant for DM, HLD, chronic back pain opioid dependent on methadone, presenting to the ED with Right abdominal pain since 10/30 at night, accompanied by nausea without vomiting, with radiation to the right shoulder and back, poor oral intake. His pain continues to be increasingly worse this morning. Denies any similar symptoms in the past. Denies subjective fevers, chills, night sweats, vision changes, or mucositis. Denies any respiratory complaints. Denies any chest pain or palpitations. Denies lower extremity swelling. Denies change in bowel habits. Last bowel movement on 10/31  Denies any dysuria. Denies abnormal skin rashes, or neuropathy. Denies any bleeding issues such as epistaxis, hematemesis, hematuria or hematochezia. Ambulating without difficulty. No ETOH or tobacco .   Assessment/Plan: Sepsis due to Acute gangrenous cholecystitis. (met sepsis criteria on admission with RR of 26-27, elevated WBC's, HR in 110, low grade temp and findings of cholecystitis in CT abdomen) -S/P laparoscopic cholecystectomy and placement of T-tube and JP drain -WBC's trending down appropriately  -still high outputs from T-tube -continue Zosyn for 1 more dose (last day of antibiotics 11/5) -diet advancement as per general surgery rec's -biggest challenge will be pain management given chronic opiates use and high tolerance/resistance; overall pain is better today  -will follow response and continue supportive care  Umbilical hernia -status post surgical repair -will continue PRN analgesics   Leukocytosis,  -Due to gangrenous cholecystitis  -Will continue zosyn for 1 more dose and stop antibiotics as recommended by surgery  -Follow WBC's trend and response (trending down appropriately; last 12.2) -Afebrile currently   Type  II Diabetes: uncontrolled with hyperglycemia A1C 8.4 Continue holding home oral diabetic medications.  Will continue SSI and levemir (dose of last one adjusted to 12 units for better CBG control)  Hyperlipidemia Continue home statins  Opioid dependence for chronic back pain  Continue methadone  Also on dilaudid PCA and with breakthrough; will start weaning off pump as tolerated. Receiving fentanyl patch (dose adjusted for better pain control), IV toradol and robaxin    Anxiety Continue home Paxil    Code Status: Full Family Communication: no family at bedside  Disposition Plan: to be determine; but imagine home when medically stable.   Consultants:  General surgery   Procedures:  See below for x-ray reports   Laparoscopic cholecystectomy with JP and T-tube placement 05/30/16  Antibiotics:  Zosyn 05/30/16>>>06/03/16  HPI/Subjective: Patient in no acute distress; reporting pain is improved and denying CP and SOB. Patient passing gas, but no BM yet. Reports no nausea, no vomiting.     Objective: Vitals:   06/03/16 1437 06/03/16 1644  BP: (!) 144/72   Pulse: 70   Resp: 19 (!) 21  Temp: 97.8 F (36.6 C)     Intake/Output Summary (Last 24 hours) at 06/03/16 1805 Last data filed at 06/03/16 4332  Gross per 24 hour  Intake             1720 ml  Output              605 ml  Net             1115 ml   Filed Weights   05/29/16 2233  Weight: 99.8 kg (220 lb)    Exam:   General:  Still Complaining of of abd pain, but significantly improved. No nausea, no vomiting. Reports still no BM.  Decrease amount output from T-tube   Cardiovascular: tachycardic, no rubs, no gallops  Respiratory: good air movement, no wheezing or crackles on exam  Abdomen: distended, still tender to palpation; positive BS; JP and T-tube drainage in place, positive serosanguineous and biliary appearance fluid collected respectively.  Musculoskeletal: no edema, no cyanosis   Data  Reviewed: Basic Metabolic Panel:  Recent Labs Lab 05/29/16 2246 05/30/16 1610 05/31/16 0532 06/01/16 0442 06/03/16 0505  NA 134*  --  135 133* 135  K 4.8  --  4.0 4.2 4.9  CL 96*  --  102 100* 100*  CO2 25  --  _0 GLUCOSE 256*  --  197* 168* 152*  BUN 10  --  22* 24* 14  CREATININE 1.07 1.13 1.03 0.94 0.79  CALCIUM 10.3  --  8.3* 7.9* 8.3*   Liver Function Tests:  Recent Labs Lab 05/29/16 2246 05/31/16 0532  AST 24 123*  ALT 25 107*  ALKPHOS 34* 42  BILITOT 2.0* 1.0  PROT 6.9 6.1*  ALBUMIN 4.1 3.0*    Recent Labs Lab 05/29/16 2246  LIPASE 19   CBC:  Recent Labs Lab 05/29/16 2246 05/30/16 1610 05/31/16 0532 06/01/16 0442 06/03/16 0505  WBC 38.3* 30.0* 24.6* 20.8* 12.2*  HGB 16.7 15.0 14.5 13.8 13.5  HCT 49.1 44.2 43.0 41.5 40.8  MCV 89.6 89.5 90.7 90.8 90.7  PLT 259 223 232 252 276   Cardiac Enzymes:  Recent Labs Lab 05/30/16 0753  TROPONINI 0.05*   CBG:  Recent Labs Lab 06/02/16 1729 06/02/16 2157 06/03/16 0750 06/03/16 1202 06/03/16 1637  GLUCAP 106* 194* 147* 206* 161*    Studies: No results found.  Scheduled Meds: . enoxaparin (LOVENOX) injection  40 mg Subcutaneous Q24H  . fentaNYL  12.5 mcg Transdermal Once  . fentaNYL  37.5 mcg Transdermal Q72H  . HYDROmorphone   Intravenous Q4H  . insulin aspart  0-9 Units Subcutaneous TID WC  . insulin detemir  12 Units Subcutaneous Daily  . lip balm   Topical BID  . methadone  180 mg Oral QHS  . PARoxetine  30 mg Oral Daily  . piperacillin-tazobactam (ZOSYN)  IV  3.375 g Intravenous Q8H  . polyethylene glycol  17 g Oral Daily   Continuous Infusions: . sodium chloride 100 mL/hr at 06/01/16 2247    Principal Problem:   Acute gangrenous cholecystitis s/p cholecystectomy 05/30/2016 Active Problems:   Pure hypercholesterolemia   Generalized anxiety disorder   Narcotic dependence ([place])   Chronic pain   Depression   Anxiety   Uncontrolled type 2 diabetes mellitus with  nephropathy ([place])   Cholecystitis   Common bile duct repair with T-Tube 11/192017    Time spent: 25 minutes    Martin Downs  [facility] Pager [phone]. If 7PM-7AM, please contact night-coverage at [email or web address], password [facility] 06/03/2016, 6:05 PM  LOS: 4 days

## 2016-06-04 DIAGNOSIS — K838 Other specified diseases of biliary tract: Secondary | ICD-10-CM

## 2016-06-04 LAB — GLUCOSE, CAPILLARY
GLUCOSE-CAPILLARY: 157 mg/dL — AB (ref 65–99)
GLUCOSE-CAPILLARY: 176 mg/dL — AB (ref 65–99)
GLUCOSE-CAPILLARY: 171 mg/dL — AB (ref 65–99)
Glucose-Capillary: 187 mg/dL — ABNORMAL HIGH (ref 65–99)

## 2016-06-04 MED ORDER — METHADONE HCL 10 MG/ML PO CONC: 180.0000 mg | Freq: Every day | ORAL | Status: DC

## 2016-06-04 NOTE — Progress Notes (Signed)
[facility] PROGRESS NOTE  Martin Downs YTK:[id number] DOB: 07/28/1966 DOA: 05/30/2016 PCP: [name], MD  Interim summary and HPI 50 y.o. male with medical history significant for DM, HLD, chronic back pain opioid dependent on methadone, presenting to the ED with Right abdominal pain since 10/30 at night, accompanied by nausea without vomiting, with radiation to the right shoulder and back, poor oral intake. His pain continues to be increasingly worse this morning. Denies any similar symptoms in the past. Denies subjective fevers, chills, night sweats, vision changes, or mucositis. Denies any respiratory complaints. Denies any chest pain or palpitations. Denies lower extremity swelling. Denies change in bowel habits. Last bowel movement on 10/31  Denies any dysuria. Denies abnormal skin rashes, or neuropathy. Denies any bleeding issues such as epistaxis, hematemesis, hematuria or hematochezia. Ambulating without difficulty. No ETOH or tobacco .   Assessment/Plan: Sepsis due to Acute gangrenous cholecystitis. (met sepsis criteria on admission with RR of 26-27, elevated WBC's, HR in 110, low grade temp and findings of cholecystitis in CT abdomen) -S/P laparoscopic cholecystectomy and placement of T-tube and JP drain -WBC's trending down appropriately  -still high outputs from T-tube -continue Zosyn for 1 more dose (last day of antibiotics 11/5) -diet advancement as per general surgery rec's -biggest challenge will be pain management given chronic opiates use and high tolerance/resistance; overall pain is better today  -will follow response and continue supportive care  Umbilical hernia -status post surgical repair -will continue PRN analgesics   Leukocytosis,  -Due to gangrenous cholecystitis  -Will continue zosyn for 1 more dose and stop antibiotics as recommended by surgery  -Follow WBC's trend and response (trending down appropriately; last 12.2) -Afebrile currently   Type  II Diabetes: uncontrolled with hyperglycemia A1C 8.4 Continue holding home oral diabetic medications.  Will continue SSI and levemir (dose of last one adjusted to 12 units for better CBG control)  Hyperlipidemia Continue home statins  Opioid dependence for chronic back pain  Continue methadone  Also on dilaudid PCA and with breakthrough; will start weaning off pump as tolerated. Receiving fentanyl patch (dose adjusted for better pain control), IV toradol and robaxin    Anxiety Continue home Paxil    Code Status: Full Family Communication: no family at bedside  Disposition Plan: to be determine; but imagine home when medically stable.   Consultants:  General surgery   Procedures:  See below for x-ray reports   Laparoscopic cholecystectomy with JP and T-tube placement 05/30/16  Antibiotics:  Zosyn 05/30/16>>>06/03/16  HPI/Subjective: Patient in no acute distress; reporting pain is improved and denying CP and SOB. Patient passing gas and have BM overnight. Reports no nausea, no vomiting.     Objective: Vitals:   06/04/16 1004 06/04/16 1403  BP: 136/78 (!) 148/75  Pulse: (!) 58 (!) 53  Resp: 16 18  Temp: 98 F (36.7 C) 98.1 F (36.7 C)    Intake/Output Summary (Last 24 hours) at 06/04/16 1706 Last data filed at 06/04/16 1531  Gross per 24 hour  Intake          2852.92 ml  Output             1805 ml  Net          1047.92 ml   Filed Weights   05/29/16 2233  Weight: 99.8 kg (220 lb)    Exam:   General:  Still Complaining of of abd pain, but significantly improved. No nausea, no vomiting. Reports BM overnight. Decrease amount output from  T-tube   Cardiovascular: tachycardic, no rubs, no gallops  Respiratory: good air movement, no wheezing or crackles on exam  Abdomen: distended, still tender to palpation; positive BS; JP and T-tube drainage in place, positive serosanguineous and biliary appearance fluid collected respectively.  Musculoskeletal: no  edema, no cyanosis   Data Reviewed: Basic Metabolic Panel:  Recent Labs Lab 05/29/16 2246 05/30/16 1610 05/31/16 0532 06/01/16 0442 06/03/16 0505  NA 134*  --  135 133* 135  K 4.8  --  4.0 4.2 4.9  CL 96*  --  102 100* 100*  CO2 25  --  _0 GLUCOSE 256*  --  197* 168* 152*  BUN 10  --  22* 24* 14  CREATININE 1.07 1.13 1.03 0.94 0.79  CALCIUM 10.3  --  8.3* 7.9* 8.3*   Liver Function Tests:  Recent Labs Lab 05/29/16 2246 05/31/16 0532  AST 24 123*  ALT 25 107*  ALKPHOS 34* 42  BILITOT 2.0* 1.0  PROT 6.9 6.1*  ALBUMIN 4.1 3.0*    Recent Labs Lab 05/29/16 2246  LIPASE 19   CBC:  Recent Labs Lab 05/29/16 2246 05/30/16 1610 05/31/16 0532 06/01/16 0442 06/03/16 0505  WBC 38.3* 30.0* 24.6* 20.8* 12.2*  HGB 16.7 15.0 14.5 13.8 13.5  HCT 49.1 44.2 43.0 41.5 40.8  MCV 89.6 89.5 90.7 90.8 90.7  PLT 259 223 232 252 276   Cardiac Enzymes:  Recent Labs Lab 05/30/16 0753  TROPONINI 0.05*   CBG:  Recent Labs Lab 06/03/16 1637 06/03/16 2138 06/04/16 0803 06/04/16 1227 06/04/16 1559  GLUCAP 161* 133* 157* 187* 176*    Studies: No results found.  Scheduled Meds: . enoxaparin (LOVENOX) injection  40 mg Subcutaneous Q24H  . fentaNYL  37.5 mcg Transdermal Q72H  . insulin aspart  0-9 Units Subcutaneous TID WC  . insulin detemir  12 Units Subcutaneous Daily  . lip balm   Topical BID  . methadone  180 mg Oral QHS  . PARoxetine  30 mg Oral Daily  . polyethylene glycol  17 g Oral Daily   Continuous Infusions: . sodium chloride 50 mL/hr at 06/04/16 1612    Principal Problem:   Acute gangrenous cholecystitis s/p cholecystectomy 05/30/2016 Active Problems:   Pure hypercholesterolemia   Generalized anxiety disorder   Narcotic dependence ([place])   Chronic pain   Depression   Anxiety   Uncontrolled type 2 diabetes mellitus with nephropathy ([place])   Cholecystitis   Common bile duct repair with T-Tube 11/192017    Time spent: 25  minutes    Barton Dubois  [facility] Pager [phone]. If 7PM-7AM, please contact night-coverage at [email or web address], password [facility] 06/04/2016, 5:06 PM  LOS: 5 days

## 2016-06-04 NOTE — Progress Notes (Signed)
Wasted 20mL of Dilaudid in sink with Christel MormonZee, Consulting civil engineerCharge RN.

## 2016-06-04 NOTE — Telephone Encounter (Signed)
Unable to reach pt letter mailed 

## 2016-06-04 NOTE — Progress Notes (Signed)
5 Days Post-Op  Subjective: Slightly more comfortable today Had BM yesterday Tolerating po  Objective: Vital signs in last 24 hours: Temp:  [97.8 F (36.6 C)-99.1 F (37.3 C)] 98.6 F (37 C) (11/06 0537) Pulse Rate:  [59-70] 60 (11/06 0537) Resp:  [15-24] 16 (11/06 0537) BP: (139-152)/(68-99) 141/68 (11/06 0537) SpO2:  [94 %-97 %] 96 % (11/06 0537) Last BM Date: 06/03/16  Intake/Output from previous day: 11/05 0701 - 11/06 0700 In: 2120 [P.O.:960; I.V.:900; IV Piggyback:260] Out: 1190 [Urine:400; Drains:790] Intake/Output this shift: Total I/O In: 1603.8 [I.V.:1603.8] Out: -   Exam: Comfortable in appearance Abdomen soft.  Jp with serosang fluid, T-tube with bile  Lab Results:   Recent Labs  06/03/16 0505  WBC 12.2*  HGB 13.5  HCT 40.8  PLT 276   BMET  Recent Labs  06/03/16 0505  NA 135  K 4.9  CL 100*  CO2 30  GLUCOSE 152*  BUN 14  CREATININE 0.79  CALCIUM 8.3*   PT/INR No results for input(s): LABPROT, INR in the last 72 hours. ABG No results for input(s): PHART, HCO3 in the last 72 hours.  Invalid input(s): PCO2, PO2  Studies/Results: No results found.  Anti-infectives: Anti-infectives    Start     Dose/Rate Route Frequency Ordered Stop   05/30/16 1400  piperacillin-tazobactam (ZOSYN) IVPB 3.375 g     3.375 g 12.5 mL/hr over 240 Minutes Intravenous Every 8 hours 05/30/16 0557 06/04/16 0220   05/30/16 0400  piperacillin-tazobactam (ZOSYN) IVPB 3.375 g     3.375 g 100 mL/hr over 30 Minutes Intravenous  Once 05/30/16 0346 05/30/16 0542      Assessment/Plan: s/p Procedure(s): LAPAROSCOPIC CHOLECYSTECTOMY WITH INTRAOPERATIVE CHOLANGIOGRAM (N/A) OPEN COMMON BILE DUCT EXPLORATION; INSERTION OF T-TUBE (N/A) HERNIA REPAIR UMBILICAL ADULT (N/A)  Ween IV pain meds, stop PCA Decrease IVF  LOS: 5 days    Rachell Druckenmiller A 06/04/2016

## 2016-06-05 LAB — GLUCOSE, CAPILLARY
GLUCOSE-CAPILLARY: 163 mg/dL — AB (ref 65–99)
Glucose-Capillary: 184 mg/dL — ABNORMAL HIGH (ref 65–99)
Glucose-Capillary: 180 mg/dL — ABNORMAL HIGH (ref 65–99)

## 2016-06-05 LAB — BASIC METABOLIC PANEL
Anion gap: 11 (ref 5–15)
BUN: 11 mg/dL (ref 6–20)
CHLORIDE: 101 mmol/L (ref 101–111)
CO2: 25 mmol/L (ref 22–32)
CREATININE: 0.84 mg/dL (ref 0.61–1.24)
Calcium: 9.1 mg/dL (ref 8.9–10.3)
GFR calc Af Amer: >60 mL/min (ref >60)
GFR calc non Af Amer: >60 mL/min (ref >60)
GLUCOSE: 151 mg/dL — AB (ref 65–99)
POTASSIUM: 4.6 mmol/L (ref 3.5–5.1)
Sodium: 137 mmol/L (ref 135–145)

## 2016-06-05 LAB — CBC
HCT: 43.2 % (ref 39.0–52.0)
HEMOGLOBIN: 14.2 g/dL (ref 13.0–17.0)
MCH: 29.6 pg (ref 26.0–34.0)
MCHC: 32.9 g/dL (ref 30.0–36.0)
MCV: 90.2 fL (ref 78.0–100.0)
Platelets: 364 10*3/uL (ref 150–400)
RBC: 4.79 MIL/uL (ref 4.22–5.81)
RDW: 14 % (ref 11.5–15.5)
WBC: 17.3 10*3/uL — ABNORMAL HIGH (ref 4.0–10.5)

## 2016-06-05 MED ORDER — METHOCARBAMOL 500 MG PO TABS
1000.0000 mg | ORAL_TABLET | Freq: Four times a day (QID) | ORAL | Status: DC | PRN
  Administered 2016-06-05 – 2016-06-08 (×8): 1000 mg via ORAL
  Filled 2016-06-05 (×8): qty 2

## 2016-06-05 MED ORDER — IBUPROFEN 600 MG PO TABS: 600.0000 mg | ORAL_TABLET | Freq: Four times a day (QID) | ORAL | Status: DC | PRN

## 2016-06-05 MED ORDER — HYDROMORPHONE HCL 2 MG/ML IJ SOLN
2.0000 mg | INTRAMUSCULAR | Status: DC | PRN
  Administered 2016-06-07: 2 mg via INTRAVENOUS
  Filled 2016-06-05: qty 1

## 2016-06-05 MED ORDER — HYDROMORPHONE HCL 2 MG PO TABS
4.0000 mg | ORAL_TABLET | ORAL | Status: DC | PRN
  Administered 2016-06-05 – 2016-06-08 (×13): 4 mg via ORAL
  Filled 2016-06-05 (×13): qty 2

## 2016-06-05 NOTE — Progress Notes (Signed)
6 Days Post-Op  Subjective: PCA 9.6 mg dilaudid 06/03/16  Off yesterday AM Dilaudid IV  push 10 mg 06/03/16 Methadone 180 PO 06/04/16 Robaxin 1 gm IV  06/04/16 Fentanyl patch 37.5 mg 06/03/16 d/ced not sure why order is still there He was on oxycodone prior to Methadone No Bm yesterday Objective: Vital signs in last 24 hours: Temp:  [98 F (36.7 C)-99 F (37.2 C)] 98 F (36.7 C) (11/07 0526) Pulse Rate:  [53-58] 54 (11/07 0526) Resp:  [16-18] 18 (11/07 0526) BP: (136-148)/(72-78) 146/72 (11/07 0526) SpO2:  [97 %-99 %] 99 % (11/07 0526) Last BM Date: 06/03/16 440 PO recorded 2800 IV fluid recorded 3125 urine  JP Drain 105 T tube 725 Tm 99.3, VSS WBC is up to 17.3  It was 12.2 on 06/03/16  Intake/Output from previous day: 11/06 0701 - 11/07 0700 In: 3213.8 [P.O.:440; I.V.:2713.8; IV Piggyback:60] Out: 3955 [Urine:3125; Drains:830] Intake/Output this shift: No intake/output data recorded.  General appearance: alert, cooperative and no distress Resp: clear to auscultation bilaterally GI: soft, sore to touch, incision OK, JP drain is serosanguinous, taking some PO's, BM 2 days ago.   T tube OK Lab Results:   Recent Labs  06/03/16 0505 06/05/16 0647  WBC 12.2* 17.3*  HGB 13.5 14.2  HCT 40.8 43.2  PLT 276 364    BMET  Recent Labs  06/03/16 0505 06/05/16 0647  NA 135 137  K 4.9 4.6  CL 100* 101  CO2 30 25  GLUCOSE 152* 151*  BUN 14 11  CREATININE 0.79 0.84  CALCIUM 8.3* 9.1   PT/INR No results for input(s): LABPROT, INR in the last 72 hours.   Recent Labs Lab 05/29/16 2246 05/31/16 0532  AST 24 123*  ALT 25 107*  ALKPHOS 34* 42  BILITOT 2.0* 1.0  PROT 6.9 6.1*  ALBUMIN 4.1 3.0*     Lipase     Component Value Date/Time   LIPASE 19 05/29/2016 2246     Studies/Results: No results found.  Medications: . enoxaparin (LOVENOX) injection  40 mg Subcutaneous Q24H  . fentaNYL  37.5 mcg Transdermal Q72H  . insulin aspart  0-9 Units Subcutaneous TID  WC  . insulin detemir  12 Units Subcutaneous Daily  . lip balm   Topical BID  . methadone  180 mg Oral QHS  . PARoxetine  30 mg Oral Daily  . polyethylene glycol  17 g Oral Daily   . sodium chloride 50 mL/hr at 06/04/16 1612    Assessment/Plan Gangrenous cholecystitis/ proximal common bile duct injury S/p Laparoscopic Cholecystectomy with IOC/ open common bile duct exploration/ placement of biliary T-tube, umbilical hernia repair, 05/30/16, Dr. Corliss Skainssuei POD 2  Chronic pain - Home dose Methadone 180 mg daily for chronic knee pain - fentanyl patch 25 up to 37.5 Dilaudid PCA/Toradol/Robaxin AODM - Insulin dependent Sleep apnea Anxiety/depression FEN: IV fluids/regular diet ID: Zosyn day 6 completed on 06/03/16, now off antibiotics DVT: Lovenox  Plan:    T tube cholangiogram tomorrow, and getting a T tube clamp from office if we can later today. He is on Miralax for constipation. Discussed  home pain control with Dr. Gwenlyn PerkingMadera.  I converted him to PO Robaxin and Ibuprofen for pain.  Change IV dilaudid to PO and work on getting him off all IV pain meds.          LOS: 6 days    Brent Noto 06/05/2016 [phone]

## 2016-06-05 NOTE — Progress Notes (Signed)
[facility] PROGRESS NOTE  Estelle Skibicki ZVJ:[id number] DOB: 1966-01-11 DOA: 05/30/2016 PCP: [name], MD  Interim summary and HPI 50 y.o. male with medical history significant for DM, HLD, chronic back pain opioid dependent on methadone, presenting to the ED with Right abdominal pain since 10/30 at night, accompanied by nausea without vomiting, with radiation to the right shoulder and back, poor oral intake. His pain continues to be increasingly worse this morning. Denies any similar symptoms in the past. Denies subjective fevers, chills, night sweats, vision changes, or mucositis. Denies any respiratory complaints. Denies any chest pain or palpitations. Denies lower extremity swelling. Denies change in bowel habits. Last bowel movement on 10/31  Denies any dysuria. Denies abnormal skin rashes, or neuropathy. Denies any bleeding issues such as epistaxis, hematemesis, hematuria or hematochezia. Ambulating without difficulty. No ETOH or tobacco .   Assessment/Plan: Sepsis due to Acute gangrenous cholecystitis. (met sepsis criteria on admission with RR of 26-27, elevated WBC's, HR in 110, low grade temp and findings of cholecystitis in CT abdomen) -S/P laparoscopic cholecystectomy and placement of T-tube and JP drain -WBC's went up, no fever no clear source of infection. Will repeat CBC -still high outputs from T-tube; JP with very minimum drainage -has completed Zosyn on 11/5 -diet advancement as per general surgery rec's -biggest challenge will be pain management given chronic opiates use and high tolerance/resistance; overall pain is better today. Working in transition to oral regimen   -will follow response and continue supportive care  Umbilical hernia -status post surgical repair -will continue PRN analgesics   Leukocytosis,  -Due to gangrenous cholecystitis  -patient has concluded treatment with IV zosyn as recommended by general surgery  -Follow WBC's trend and response  (last WBC's up in the 17,000 range; but no source of infection identified) -Afebrile currently   Type II Diabetes: uncontrolled with hyperglycemia -A1C 8.4 -Continue holding home oral diabetic medications.  -Will continue SSI and levemir (dose of last one adjusted to 12 units for better CBG control)  Hyperlipidemia -Continue home statins  Opioid dependence for chronic back pain  -Continue methadone  -Will transition to PO analgesics regimen (using PO dilaudid and IV dilaudid for breakthrough); fentanyl patch discontinued and adjuvant therapy converted to PO form (now on ibuprofen instead of toradol and -PO robaxin) start weaning off pump as tolerated.  Anxiety Continue home Paxil    Code Status: Full Family Communication: no family at bedside  Disposition Plan: to be determine; but imagine home when medically stable.   Consultants:  General surgery   Procedures:  See below for x-ray reports   Laparoscopic cholecystectomy with JP and T-tube placement 05/30/16  Antibiotics:  Zosyn 05/30/16>>>06/03/16  HPI/Subjective: Patient in no acute distress; reporting pain is continuously improving and denying CP and SOB. Patient passing gas and have BM on _0 30 ml  Net            -  2720 ml   Filed Weights   05/29/16 2233  Weight: 99.8 kg (220 lb)    Exam:   General:  Still Complaining of of abd pain, but significantly improved and tolerating being off PCA. No nausea, no vomiting. Reports BM on Sunday, and feels like having another today.  Decrease amount output from JP and T-tube.   Cardiovascular: tachycardic, no rubs, no gallops  Respiratory: good air movement, no wheezing or crackles on exam  Abdomen: distended, still tender to palpation; positive BS; JP and T-tube drainage in place, positive serosanguineous(minimal drainage) and biliary appearance fluid collected respectively.  Musculoskeletal: no edema, no cyanosis   Data Reviewed: Basic Metabolic Panel:  Recent Labs Lab 05/29/16 2246 05/30/16 1610 05/31/16 0532 06/01/16 0442 06/03/16 0505 06/05/16 0647  NA 134*  --  135 133* 135 137  K 4.8  --  4.0 4.2 4.9 4.6  CL 96*  --  102 100* 100* 101  CO2 25  --  _0 GLUCOSE 256*  --  197* 168* 152* 151*  BUN 10  --  22* 24* 14 11  CREATININE 1.07 1.13 1.03 0.94 0.79 0.84  CALCIUM 10.3  --  8.3* 7.9* 8.3* 9.1   Liver Function Tests:  Recent Labs Lab 05/29/16 2246 05/31/16 0532  AST 24 123*  ALT 25 107*  ALKPHOS 34* 42  BILITOT 2.0* 1.0  PROT 6.9 6.1*  ALBUMIN 4.1 3.0*    Recent Labs Lab 05/29/16 2246  LIPASE 19   CBC:  Recent Labs Lab 05/30/16 1610 05/31/16 0532 06/01/16 0442 06/03/16 0505 06/05/16 0647  WBC 30.0* 24.6* 20.8* 12.2* 17.3*  HGB 15.0 14.5 13.8 13.5 14.2  HCT 44.2 43.0 41.5 40.8 43.2  MCV 89.5 90.7 90.8 90.7 90.2  PLT 223 232 252 276 364   Cardiac Enzymes:  Recent Labs Lab 05/30/16 0753  TROPONINI 0.05*   CBG:  Recent Labs Lab 06/04/16 0803 06/04/16 1227 06/04/16 1559 06/04/16 2103 06/05/16 0731  GLUCAP 157* 187* 176* 171* 163*    Studies: No results found.  Scheduled Meds: . enoxaparin (LOVENOX) injection  40 mg Subcutaneous Q24H  . insulin aspart  0-9 Units Subcutaneous TID WC  . insulin detemir  12 Units Subcutaneous Daily  . lip balm   Topical BID  . methadone  180 mg Oral QHS  . PARoxetine  30 mg Oral Daily  . polyethylene glycol  17 g Oral Daily   Continuous Infusions: . sodium chloride 50 mL/hr at 06/04/16 1612    Principal  Problem:   Acute gangrenous cholecystitis s/p cholecystectomy 05/30/2016 Active Problems:   Pure hypercholesterolemia   Generalized anxiety disorder   Narcotic dependence ([place])   Chronic pain   Depression   Anxiety   Uncontrolled type 2 diabetes mellitus with nephropathy ([place])   Cholecystitis   Common bile duct repair with T-Tube 11/192017    Time spent: 25 minutes    Barton Dubois  [facility] Pager [phone]. If 7PM-7AM, please contact night-coverage at [email or web address], password [facility] 06/05/2016, 11:42 AM  LOS: 6 days

## 2016-06-06 ENCOUNTER — Inpatient Hospital Stay (HOSPITAL_COMMUNITY): Payer: 59

## 2016-06-06 DIAGNOSIS — K810 Acute cholecystitis: Secondary | ICD-10-CM

## 2016-06-06 LAB — CBC
HEMATOCRIT: 44.8 % (ref 39.0–52.0)
HEMOGLOBIN: 15.1 g/dL (ref 13.0–17.0)
MCH: 30.3 pg (ref 26.0–34.0)
MCHC: 33.7 g/dL (ref 30.0–36.0)
MCV: 89.8 fL (ref 78.0–100.0)
Platelets: 362 10*3/uL (ref 150–400)
RBC: 4.99 MIL/uL (ref 4.22–5.81)
RDW: 13.8 % (ref 11.5–15.5)
WBC: 16.9 10*3/uL — AB (ref 4.0–10.5)

## 2016-06-06 LAB — GLUCOSE, CAPILLARY
GLUCOSE-CAPILLARY: 167 mg/dL — AB (ref 65–99)
GLUCOSE-CAPILLARY: 144 mg/dL — AB (ref 65–99)
GLUCOSE-CAPILLARY: 193 mg/dL — AB (ref 65–99)
GLUCOSE-CAPILLARY: 113 mg/dL — AB (ref 65–99)
Glucose-Capillary: 166 mg/dL — ABNORMAL HIGH (ref 65–99)

## 2016-06-06 LAB — CREATININE, SERUM
Creatinine, Ser: 1.02 mg/dL (ref 0.61–1.24)
GFR calc non Af Amer: >60 mL/min (ref >60)

## 2016-06-06 MED ORDER — SODIUM CHLORIDE 0.9 % IJ SOLN
INTRAMUSCULAR | Status: AC
  Filled 2016-06-06: qty 30

## 2016-06-06 MED ORDER — IOPAMIDOL (ISOVUE-300) INJECTION 61%
INTRAVENOUS | Status: AC
  Filled 2016-06-06: qty 50

## 2016-06-06 MED ORDER — HYDROMORPHONE HCL 4 MG PO TABS: 4.0000 mg | ORAL_TABLET | ORAL | 0 refills | Status: DC | PRN

## 2016-06-06 MED ORDER — IOPAMIDOL (ISOVUE-300) INJECTION 61%
30.0000 mL | Freq: Once | INTRAVENOUS | Status: AC | PRN
  Administered 2016-06-06: 12 mL

## 2016-06-06 NOTE — Progress Notes (Signed)
7 Days Post-Op  Subjective: Pain control improved  Objective: Vital signs in last 24 hours: Temp:  [98 F (36.7 C)-98.5 F (36.9 C)] 98.5 F (36.9 C) (11/08 0556) Pulse Rate:  [53-61] 53 (11/08 0556) Resp:  [16-18] 16 (11/08 0556) BP: (132-141)/(66-72) 132/66 (11/08 0556) SpO2:  [98 %-99 %] 98 % (11/08 0556) Last BM Date: 06/03/16  Intake/Output from previous day: 11/07 0701 - 11/08 0700 In: 2160 [P.O.:1560; I.V.:600] Out: 5935 [Urine:5125; Drains:810] Intake/Output this shift: Total I/O In: -  Out: 850 [Urine:775; Drains:75]  Abdomen soft Incisions stable t-tube with bile Lab Results:   Recent Labs  06/05/16 0647 06/06/16 0442  WBC 17.3* 16.9*  HGB 14.2 15.1  HCT 43.2 44.8  PLT 364 362   BMET  Recent Labs  06/05/16 0647 06/06/16 0442  NA 137  --   K 4.6  --   CL 101  --   CO2 25  --   GLUCOSE 151*  --   BUN 11  --   CREATININE 0.84 1.02  CALCIUM 9.1  --    PT/INR No results for input(s): LABPROT, INR in the last 72 hours. ABG No results for input(s): PHART, HCO3 in the last 72 hours.  Invalid input(s): PCO2, PO2  Studies/Results: No results found.  Anti-infectives: Anti-infectives    Start     Dose/Rate Route Frequency Ordered Stop   05/30/16 1400  piperacillin-tazobactam (ZOSYN) IVPB 3.375 g     3.375 g 12.5 mL/hr over 240 Minutes Intravenous Every 8 hours 05/30/16 0557 06/04/16 0220   05/30/16 0400  piperacillin-tazobactam (ZOSYN) IVPB 3.375 g     3.375 g 100 mL/hr over 30 Minutes Intravenous  Once 05/30/16 0346 05/30/16 0542      Assessment/Plan: s/p Procedure(s): LAPAROSCOPIC CHOLECYSTECTOMY WITH INTRAOPERATIVE CHOLANGIOGRAM (N/A) OPEN COMMON BILE DUCT EXPLORATION; INSERTION OF T-TUBE (N/A) HERNIA REPAIR UMBILICAL ADULT (N/A)  For t-tube cholangiogram today Discharge planning after results known  LOS: 7 days    Martin Downs A 06/06/2016

## 2016-06-06 NOTE — Discharge Summary (Signed)
Physician Discharge Summary  Martin Downs JGG:[id number] DOB: 1965-12-01 DOA: 05/30/2016  PCP: [name], MD  Admit date: 05/30/2016 Discharge date: 06/07/2016  Recommendations for Outpatient Follow-up:  1. Pt will need to follow up with PCP in 1-2 weeks post discharge 2. Please obtain BMP to evaluate electrolytes and kidney function 3. Check liver function enzymes  4. Please also check CBC to evaluate Hg and Hct levels  Discharge Diagnoses:  Principal Problem:   Acute gangrenous cholecystitis s/p cholecystectomy 05/30/2016 Active Problems:   Pure hypercholesterolemia   Generalized anxiety disorder   Narcotic dependence ([place])  Discharge Condition: Stable  Diet recommendation: Heart healthy diet discussed in details   Interim summary and HPI 50 y.o.malewith medical history significant for DM,HLD, chronic back pain opioid dependent on methadone, presenting to the ED with Right abdominal pain since 10/30 at night, accompanied by nausea without vomiting, with radiation to the right shoulder and back, poor oral intake. His pain continues to be increasingly worse this morning. Denies any similar symptoms in the past. Denies subjective fevers, chills, night sweats, vision changes, or mucositis. Denies any respiratory complaints. Denies any chest pain or palpitations. Denies lower extremity swelling. Denies change in bowel habits. Last bowel movement on 10/31 Denies any dysuria. Denies abnormal skin rashes, or neuropathy. Denies any bleeding issues such as epistaxis, hematemesis, hematuria or hematochezia. Ambulating without difficulty. No ETOH or tobacco .   Assessment/Plan: Sepsis due to Acute gangrenous cholecystitis. (met sepsis criteria on admission with RR of 26-27, elevated WBC's, HR in 110, low grade temp and findings of cholecystitis in CT abdomen) - S/P laparoscopic cholecystectomy and placement of T-tube and JP drain - underwent cholangiogram today, results unremarkable, diet  advanced, pt to be discharged likely in AM - has completed Zosyn on 54/6  Umbilical hernia - status post surgical repair - will continue PRN analgesics   Leukocytosis - Due to gangrenous cholecystitis, sepsis  - patient has concluded treatment with IV zosyn as recommended by general surgery  - no fevers overnight, CBC in AM  Type II Diabetes: uncontrolled with hyperglycemia - A1C 8.4 - Continue holding home oral diabetic medications, resume upon discharge   Hyperlipidemia, mixed - Continue home statins  Opioid dependence for chronic back pain  - Continue methadone  - Will transition to PO analgesics and if pt tolerating well, can be discharge home in AM  Anxiety - Continue home Paxil   Code Status: Full Family Communication: no family at bedside  Disposition Plan: likely in AM if tolerating diet well and surgery clears    Consultants:  General surgery   Procedures:  See below for x-ray reports   Laparoscopic cholecystectomy with JP and T-tube placement 05/30/16  Antibiotics:  Zosyn 05/30/16>>>06/03/16  Procedures/Studies: Dg Cholangiogram Operative  Result Date: 05/30/2016 CLINICAL DATA:  Cholecystitis EXAM: INTRAOPERATIVE CHOLANGIOGRAM TECHNIQUE: Cholangiographic images from the C-arm fluoroscopic device were submitted for interpretation post-operatively. Please see the procedural report for the amount of contrast and the fluoroscopy time utilized. COMPARISON:  None. FINDINGS: Contrast fills the duodenum and biliary tree without filling defects in the common bile duct. Contrast fills the pancreatic duct. IMPRESSION: Patent biliary tree. Electronically Signed   By: Marybelle Killings M.D.   On: 05/30/2016 12:01   Ct Abdomen Pelvis W Contrast  Result Date: 05/30/2016 CLINICAL DATA:  Severe right-sided abdominal pain. Colonoscopy 5 days ago. EXAM: CT ABDOMEN AND PELVIS WITH CONTRAST TECHNIQUE: Multidetector CT imaging of the abdomen and pelvis was performed using  the standard protocol following  bolus administration of intravenous contrast. CONTRAST:  162m ISOVUE-300 IOPAMIDOL (ISOVUE-300) INJECTION 61% COMPARISON:  None. FINDINGS: Lower chest: Mild linear right lung base opacities, probably atelectatic. No consolidation in the bases. No effusions. Hepatobiliary: Mild fatty infiltration of the liver without focal lesion. Probable gallbladder calculi. Moderate gallbladder mural thickening and pericholecystic inflammatory stranding. No bile duct dilatation. Pancreas: Unremarkable. No pancreatic ductal dilatation or surrounding inflammatory changes. Spleen: Normal in size without focal abnormality. Adrenals/Urinary Tract: Adrenal glands are unremarkable. No renal parenchymal lesions. 3 mm midpole left renal collecting system calculus. Ureters and urinary bladder are unremarkable. Stomach/Bowel: Stomach is within normal limits. Appendix appears normal. No evidence of bowel wall thickening, distention, or inflammatory changes. Vascular/Lymphatic: Aortic atherosclerosis. No enlarged abdominal or pelvic lymph nodes. Reproductive: Prostate is unremarkable. Other: Small fat containing umbilical hernia.  No ascites. Musculoskeletal: No fracture is seen. IMPRESSION: Cholelithiasis with mild gallbladder mural thickening and pericholecystic inflammatory stranding. This may represent acute cholecystitis. No bile duct dilatation. Electronically Signed   By: DAndreas NewportM.D.   On: 05/30/2016 04:38   Dg Cholangiogram  Existing Tube  Result Date: 06/06/2016 CLINICAL DATA:  Status post repair of common duct injury during cholecystectomy. Seven days postop. EXAM: CATHETER CHOLANGIOGRAM CONTRAST:  12 cc of Isovue-300 FLUOROSCOPY TIME:  Number of Acquired Spot Images: 0 Fluoroscopy Time: 1  minutes 22 seconds COMPARISON:  CT of 05/30/2016. PROCEDURE: 12 cc of Isovue-300 were injected via the biliary catheter. FINDINGS: Preprocedure scout film demonstrates right upper quadrant biliary  drain and surgical drain. Upon injection of contrast into the biliary drain, there is prompt flow of contrast into the surgical drain. Normal caliber of the common duct. Subsequently, there is contrast extravasation within the proximal to mid common duct, including on series 5. This is relatively well contained. No biliary duct dilatation is seen. Contrast flows into the duodenum. IMPRESSION: 1. Relatively well contained contrast extravasation from the proximal common duct, draining into the surgical drain. 2. No evidence of biliary obstruction. These results will be called to the ordering clinician or representative by the Radiologist Assistant, and communication documented in the PACS or zVision Dashboard. Electronically Signed   By: KAbigail MiyamotoM.D.   On: 06/06/2016 10:05      Discharge Exam: Vitals:   06/05/16 2200 06/06/16 0556  BP: 136/67 132/66  Pulse: (!) 55 (!) 53  Resp: 18 16  Temp: 98 F (36.7 C) 98.5 F (36.9 C)   Vitals:   06/05/16 0526 06/05/16 1453 06/05/16 2200 06/06/16 0556  BP: (!) 146/72 (!) 141/72 136/67 132/66  Pulse: (!) 54 61 (!) 55 (!) 53  Resp: _0 Temp: 98 F (36.7 C) 98.2 F (36.8 C) 98 F (36.7 C) 98.5 F (36.9 C)  TempSrc: Oral Oral Oral Oral  SpO2: 99% 99% 99% 98%  Weight:      Height:        General: Pt is alert, follows commands appropriately, not in acute distress Cardiovascular: Regular rate and rhythm, S1/S2 +, no murmurs, no rubs, no gallops Respiratory: Clear to auscultation bilaterally, no wheezing, no crackles, no rhonchi Abdominal: Soft, non tender, non distended, bowel sounds +, no guarding  Discharge Instructions     Medication List    TAKE these medications   aspirin 81 MG tablet Take 81 mg by mouth daily.   B-D INS SYRINGE 0.5CC/31GX5/16 31G X 5/16" 0.5 ML Misc Generic drug:  Insulin Syringe-Needle U-100 USE 3 TIMES DAILY   BASAGLAR KWIKPEN 100 UNIT/ML Sopn  Inject 0.15 mLs (15 Units total) into the skin at  bedtime.   FARXIGA 10 MG Tabs tablet Generic drug:  dapagliflozin propanediol TAKE 1 TABLET BY MOUTH NIGHTLY   fenofibrate 160 MG tablet TAKE 1 TABLET (160 MG TOTAL) BY MOUTH DAILY.   fluticasone 50 MCG/ACT nasal spray Commonly known as:  FLONASE PLACE 2 SPRAYS INTO BOTH NOSTRILS DAILY.   HYDROmorphone 4 MG tablet Commonly known as:  DILAUDID Take 1 tablet (4 mg total) by mouth every 4 (four) hours as needed for severe pain.   Insulin Detemir 100 UNIT/ML Pen Commonly known as:  LEVEMIR FLEXPEN Inject 30 Units into the skin every morning. What changed:  how much to take   Insulin Pen Needle 32G X 4 MM Misc Use to inject insulin 1 time daily as instructed.   metFORMIN 1000 MG tablet Commonly known as:  GLUCOPHAGE TAKE 1 TABLET (1,000 MG TOTAL) BY MOUTH 2 (TWO) TIMES DAILY WITH A MEAL.   methadone 10 MG/ML solution Commonly known as:  DOLOPHINE Take 180 mg by mouth daily.   multivitamin tablet Take 2 tablets by mouth 2 (two) times daily.   ONE TOUCH ULTRA MINI w/Device Kit Use as directed to test blood sugar twice daily 250.00   ONE TOUCH ULTRA TEST test strip Generic drug:  glucose blood USE 8 TIMES A DAY AS INSTRUCTED   ONETOUCH DELICA LANCETS 14E Misc USE TO TEST BLOOD SUGAR 4 TIMES DAILY AS INSTRUCTED.   PARoxetine 30 MG tablet Commonly known as:  PAXIL Take 1 tablet (30 mg total) by mouth daily.   rosuvastatin 10 MG tablet Commonly known as:  CRESTOR Take 10 mg by mouth daily.   sildenafil 100 MG tablet Commonly known as:  VIAGRA Take 0.5-1 tablets (50-100 mg total) by mouth daily as needed for erectile dysfunction.   SYRINGE-NEEDLE (DISP) 3 ML 18G X 1-1/2" 3 ML Misc Use to inject testosterone.   TANZEUM 50 MG Pen Generic drug:  Albiglutide INJECT 1 SYRINGEGUL (50MG) UNDER SKIN ONCE A WEEK   testosterone cypionate 200 MG/ML injection Commonly known as:  DEPOTESTOSTERONE CYPIONATE INJECT 0.25 MILLILITERS INTRAMUSCULARLY ONCE PER WEEK   vitamin  B-12 1000 MCG tablet Commonly known as:  CYANOCOBALAMIN Take 1,000 mcg by mouth daily.      Follow-up Information    [name], MD Follow up.   Specialty:  Family Medicine Contact information: [place] [address] [phone]            The results of significant diagnostics from this hospitalization (including imaging, microbiology, ancillary and laboratory) are listed below for reference.     Microbiology: No results found for this or any previous visit (from the past 240 hour(s)).   Labs: Basic Metabolic Panel:  Recent Labs Lab 05/31/16 0532 06/01/16 0442 06/03/16 0505 06/05/16 0647 06/06/16 0442  NA 135 133* 135 137  --   K 4.0 4.2 4.9 4.6  --   CL 102 100* 100* 101  --   CO2 _0 --   GLUCOSE 197* 168* 152* 151*  --   BUN 22* 24* 14 11  --   CREATININE 1.03 0.94 0.79 0.84 1.02  CALCIUM 8.3* 7.9* 8.3* 9.1  --    Liver Function Tests:  Recent Labs Lab 05/31/16 0532  AST 123*  ALT 107*  ALKPHOS 42  BILITOT 1.0  PROT 6.1*  ALBUMIN 3.0*   CBC:  Recent Labs Lab 05/31/16 0532 06/01/16 0442 06/03/16 0505 06/05/16 1683  06/06/16 0442  WBC 24.6* 20.8* 12.2* 17.3* 16.9*  HGB 14.5 13.8 13.5 14.2 15.1  HCT 43.0 41.5 40.8 43.2 44.8  MCV 90.7 90.8 90.7 90.2 89.8  PLT 232 252 276 364 362   CBG:  Recent Labs Lab 06/05/16 0731 06/05/16 1138 06/05/16 1657 06/05/16 2156 06/06/16 0752  GLUCAP 163* 184* 167* 180* 166*   SIGNED: Time coordinating discharge: 30 minutes  MAGICK-Nestor Wieneke, MD  [facility] 06/06/2016, 11:43 AM Pager [phone]  If 7PM-7AM, please contact night-coverage [email or web address] Password [email or web address]

## 2016-06-07 LAB — GLUCOSE, CAPILLARY
GLUCOSE-CAPILLARY: 160 mg/dL — AB (ref 65–99)
Glucose-Capillary: 185 mg/dL — ABNORMAL HIGH (ref 65–99)
Glucose-Capillary: 220 mg/dL — ABNORMAL HIGH (ref 65–99)
Glucose-Capillary: 135 mg/dL — ABNORMAL HIGH (ref 65–99)

## 2016-06-07 NOTE — Progress Notes (Signed)
8 Days Post-Op  Subjective: Tolerating diet, concerned about diabetic medicines. Still having pain, but much better than before.  Discussing going home and need to get back to pain clinic to supervise him with additional pain meds.  Staple lines look good, we can take them out and steri strip any time.  He is eating and tolerating diet well.  He had BM yesterday.  Normal is every 2-3 days for him.    Objective: Vital signs in last 24 hours: Temp:  [97.7 F (36.5 C)-98.2 F (36.8 C)] 97.7 F (36.5 C) (11/09 0403) Pulse Rate:  [58-70] 70 (11/09 0403) Resp:  [16-18] 18 (11/09 0403) BP: (119-139)/(69-73) 119/73 (11/09 0403) SpO2:  [99 %-100 %] 100 % (11/09 0403) Last BM Date: 06/06/16    Dilaudid 20 mg PO yesterday Methadone 180 mg yesterday Robaxin 2 gm yesterday  940 PO 3800 urine 130 JP drain 635 from T tube BM x 1 Afebrile, VSS No labs  CHOLANGIOGRAM EXISTING TUBE 06/06/16: 1. Relatively well contained contrast extravasation from the proximal common duct, draining into the surgical drain. 2. No evidence of biliary obstruction.  Intake/Output from previous day: 11/08 0701 - 11/09 0700 In: 940 [P.O.:940] Out: 4565 [Urine:3800; Drains:765] Intake/Output this shift: No intake/output data recorded.  General appearance: alert, cooperative and no distress GI: soft, still distended some, JP and T-tube site a little red but OK.  Staple line is healing well and we can take out before discharge    Lab Results:   Recent Labs  06/05/16 0647 06/06/16 0442  WBC 17.3* 16.9*  HGB 14.2 15.1  HCT 43.2 44.8  PLT 364 362    BMET  Recent Labs  06/05/16 0647 06/06/16 0442  NA 137  --   K 4.6  --   CL 101  --   CO2 25  --   GLUCOSE 151*  --   BUN 11  --   CREATININE 0.84 1.02  CALCIUM 9.1  --    PT/INR No results for input(s): LABPROT, INR in the last 72 hours.  No results for input(s): AST, ALT, ALKPHOS, BILITOT, PROT, ALBUMIN in the last 168 hours.   Lipase      Component Value Date/Time   LIPASE 19 05/29/2016 2246     Studies/Results: Dg Cholangiogram  Existing Tube  Result Date: 06/06/2016 CLINICAL DATA:  Status post repair of common duct injury during cholecystectomy. Seven days postop. EXAM: CATHETER CHOLANGIOGRAM CONTRAST:  12 cc of Isovue-300 FLUOROSCOPY TIME:  Number of Acquired Spot Images: 0 Fluoroscopy Time: 1  minutes 22 seconds COMPARISON:  CT of 05/30/2016. PROCEDURE: 12 cc of Isovue-300 were injected via the biliary catheter. FINDINGS: Preprocedure scout film demonstrates right upper quadrant biliary drain and surgical drain. Upon injection of contrast into the biliary drain, there is prompt flow of contrast into the surgical drain. Normal caliber of the common duct. Subsequently, there is contrast extravasation within the proximal to mid common duct, including on series 5. This is relatively well contained. No biliary duct dilatation is seen. Contrast flows into the duodenum. IMPRESSION: 1. Relatively well contained contrast extravasation from the proximal common duct, draining into the surgical drain. 2. No evidence of biliary obstruction. These results will be called to the ordering clinician or representative by the Radiologist Assistant, and communication documented in the PACS or zVision Dashboard. Electronically Signed   By: Jeronimo GreavesKyle  Talbot M.D.   On: 06/06/2016 10:05    Medications: . enoxaparin (LOVENOX) injection  40 mg Subcutaneous Q24H  .  insulin aspart  0-9 Units Subcutaneous TID WC  . insulin detemir  12 Units Subcutaneous Daily  . lip balm   Topical BID  . methadone  180 mg Oral QHS  . PARoxetine  30 mg Oral Daily  . polyethylene glycol  17 g Oral Daily    Assessment/Plan Gangrenous cholecystitis/ proximal common bile duct injury S/p Laparoscopic Cholecystectomy with IOC/ open common bile duct exploration/ placement of biliary T-tube, umbilical hernia repair, 05/30/16, Dr. Corliss Skainssuei POD 8 Chronic pain- Home dose Methadone 180  mg daily for chronic knee pain - PO Dilaudid and Methadone for pain currently AODM - Insulin dependent Sleep apnea Anxiety/depression FEN:IV fluids/regular diet ID: Zosyn day 6 completed on 06/03/16, now off antibiotics DVT: Lovenox   Plan:  Discussed with Dr. Izola PriceMyers, and aim for d/c today.  We will set up follow up with OP cholangiogram in 3 weeks then see Dr. Corliss Skainssuei.  Our office will coordinate the cholangiogram.  D/c staples, benzoin and steri strip today.       LOS: 8 days    Lou Irigoyen 06/07/2016 [phone]

## 2016-06-07 NOTE — Discharge Instructions (Signed)
Cholecystitis Cholecystitis is inflammation of the gallbladder. It is often called a gallbladder attack. The gallbladder is a pear-shaped organ that lies beneath the liver on the right side of the body. The gallbladder stores bile, which is a fluid that helps the body to digest fats. If bile builds up in your gallbladder, your gallbladder becomes inflamed. This condition may occur suddenly (be acute). Repeat episodes of acute cholecystitis or prolonged episodes may lead to a long-term (chronic) condition. Cholecystitis is serious and it requires treatment.  CAUSES The most common cause of this condition is gallstones. Gallstones can block the tube (duct) that carries bile out of your gallbladder. This causes bile to build up. Other causes of this condition include:  Damage to the gallbladder due to a decrease in blood flow.  Infections in the bile ducts.  Scars or kinks in the bile ducts.  Tumors in the liver, pancreas, or gallbladder. RISK FACTORS This condition is more likely to develop in:  People who have sickle cell disease.  People who take birth control pills or use estrogen.  People who have alcoholic liver disease.  People who have liver cirrhosis.  People who have their nutrition delivered through a vein (parenteral nutrition).  People who do not eat or drink (do fasting) for a long period of time.  People who are obese.  People who have rapid weight loss.  People who are pregnant.  People who have increased triglyceride levels.  People who have pancreatitis. SYMPTOMS Symptoms of this condition include:  Abdominal pain, especially in the upper right area of the abdomen.  Abdominal tenderness or bloating.  Nausea.  Vomiting.  Fever.  Chills.  Yellowing of the skin and the whites of the eyes (jaundice). DIAGNOSIS This condition is diagnosed with a medical history and physical exam. You may also have other tests, including:  Imaging tests, such as:  An  ultrasound of the gallbladder.  A CT scan of the abdomen.  A gallbladder nuclear scan (HIDA scan). This scan allows your health care provider to see the bile moving from your liver to your gallbladder and to your small intestine.  MRI.  Blood tests, such as:  A complete blood count, because the white blood cell count may be higher than normal.  Liver function tests, because some levels may be higher than normal with certain types of gallstones. TREATMENT Treatment may include:  Fasting for a certain amount of time.  IV fluids.  Medicine to treat pain or vomiting.  Antibiotic medicine.  Surgery to remove your gallbladder (cholecystectomy). This may happen immediately or at a later time. HOME CARE INSTRUCTIONS Home care will depend on your treatment. In general:  Take over-the-counter and prescription medicines only as told by your health care provider.  If you were prescribed an antibiotic medicine, take it as told by your health care provider. Do not stop taking the antibiotic even if you start to feel better.  Follow instructions from your health care provider about what to eat or drink. When you are allowed to eat, avoid eating or drinking anything that triggers your symptoms.  Keep all follow-up visits as told by your health care provider. This is important. SEEK MEDICAL CARE IF:  Your pain is not controlled with medicine.  You have a fever. SEEK IMMEDIATE MEDICAL CARE IF:  Your pain moves to another part of your abdomen or to your back.  You continue to have symptoms or you develop new symptoms even with treatment.   This information  is not intended to replace advice given to you by your health care provider. Make sure you discuss any questions you have with your health care provider.   Document Released: 07/16/2005 Document Revised: 04/06/2015 Document Reviewed: 10/27/2014 Elsevier Interactive Patient Education 2016 [employer]      [facility] [place] [phone]  OPEN ABDOMINAL SURGERY: POST OP INSTRUCTIONS No lifting over 10 pounds till you are cleared by Dr. Corliss Skainssuei  Always review your discharge instruction sheet given to you by the facility where your surgery was performed.  IF YOU HAVE DISABILITY OR FAMILY LEAVE FORMS, YOU MUST BRING THEM TO THE OFFICE FOR PROCESSING.  PLEASE DO NOT GIVE THEM TO YOUR DOCTOR.  1. A prescription for pain medication may be given to you upon discharge.  Take your pain medication as prescribed, if needed.  If narcotic pain medicine is not needed, then you may take acetaminophen (Tylenol) or ibuprofen (Advil) as needed. 2. Take your usually prescribed medications unless otherwise directed. 3. If you need a refill on your pain medication, please contact your pharmacy. They will contact our office to request authorization.  Prescriptions will not be filled after 5pm or on week-ends. 4. You should follow a light diet the first few days after arrival home, such as soup and crackers, pudding, etc.unless your doctor has advised otherwise. A high-fiber, low fat diet can be resumed as tolerated.   Be sure to include lots of fluids daily. Most patients will experience some swelling and bruising on the chest and neck area.  Ice packs will help.  Swelling and bruising can take several days to resolve 5. Most patients will experience some swelling and bruising in the area of the incision. Ice pack will help. Swelling and bruising can take several days to resolve..  6. It is common to experience some constipation if taking pain medication after surgery.  Increasing fluid intake and taking a stool softener will usually help or prevent this problem from occurring.  A mild laxative (Milk of Magnesia or Miralax) should be taken according to package directions if there are no bowel movements after 48 hours. 7.  You may have steri-strips (small skin tapes) in place directly over the incision.  These strips should be left  on the skin for 7-10 days.  If your surgeon used skin glue on the incision, you may shower in 24 hours.  The glue will flake off over the next 2-3 weeks.  Any sutures or staples will be removed at the office during your follow-up visit. You may find that a light gauze bandage over your incision may keep your staples from being rubbed or pulled. You may shower and replace the bandage daily. 8. ACTIVITIES:  You may resume regular (light) daily activities beginning the next day--such as daily self-care, walking, climbing stairs--gradually increasing activities as tolerated.  You may have sexual intercourse when it is comfortable.  Refrain from any heavy lifting or straining until approved by your doctor. a. You may drive when you no longer are taking prescription pain medication, you can comfortably wear a seatbelt, and you can safely maneuver your car and apply brakes b. Return to Work: ___________________________________ 9. You should see your doctor in the office for a follow-up appointment approximately two weeks after your surgery.  Make sure that you call for this appointment within a day or two after you arrive home to insure a convenient appointment time. OTHER INSTRUCTIONS:  _____________________________________________________________ _____________________________________________________________  WHEN TO CALL YOUR DOCTOR: 1.  Fever over 101.0 2. Inability to urinate 3. Nausea and/or vomiting 4. Extreme swelling or bruising 5. Continued bleeding from incision. 6. Increased pain, redness, or drainage from the incision. 7. Difficulty swallowing or breathing 8. Muscle cramping or spasms. 9. Numbness or tingling in hands or feet or around lips.  The clinic staff is available to answer your questions during regular business hours.  Please dont hesitate to call and ask to speak to one of the nurses if you have concerns.  For further questions, please visit  [email or web address]   Biliary Drainage Catheter Home Guide A biliary drainage catheter is a tube that is inserted through your skin into the bile ducts in your liver. The purpose of a biliary drainage catheter is to prevent backup of bile into the liver. Bile is a thick yellow or green fluid that helps digest fat in foods. Backup of bile can occur when there is a blockage preventing bile from moving from the bile ducts into your small intestine, as it normally should. This can occur from a tumor, gallstones, or scar tissue. There are three types of biliary drainage:  External biliary drainage--With this type, bile is only drained into a collection bag outside your body (external collection bag).  Internal-external biliary drainage--Bile is drained to an external collection bag as well as into your small intestine.  Internal biliary drainage--Bile is only drained into your small intestine. HOW DO I CHANGE MY DRESSING? The dressing over the drain should be changed at least every other day or more frequently if needed to keep the dressing dry.  1. Wash your hands with soap and water. 2. Gently remove the old dressing. Avoid using scissors to remove the dressing because this may lead to accidental damage to the drain. 3. Once the dressing is removed, inspect the skin around the drain for redness, swelling, and foul smelling yellow or green discharge. 4. If the drain was sutured to the skin, inspect the suture to verify that it is still anchored in the skin. 5. Clean the skin around the insertion site with mild soap and warm water. Pat the area dry with a clean cloth. 6. Do not apply creams, ointments, or alcohol to the site. Allow the skin to air dry completely before applying a new dressing. 7. Use a drain sponge (4x4 split gauze) and place the drain through the slit. Cover the drain and the first gauze with a 4x4 gauze. The drain should rest on the gauze and not on the skin. 8. Tape the  dressing to the skin. 9. Wash your hands with soap and water. HOW DO I FLUSH A DRAIN NOT ATTACHED TO A BAG? Biliary drains should be flushed daily unless you are instructed otherwise by a health care provider. The end of the drain is closed using an IV cap to which a syringe can be directly connected.  1. Clean the IV cap with an alcohol swab and then screw the tip of a 10 ml normal saline syringe onto the IV cap. 2. Inject the saline over 5-10 seconds. If you feel resistance while injecting, stop immediately. 3. Remove the syringe from the cap. HOW DO I ATTACH A BAG TO MY DRAIN? If you are having trouble with your biliary drain, you may be directed by your health care provider to use bag drainage until you can be seen to fix the problem. You should always have a drainage bag and connecting tubing at home for this reason. If you do not, remember to  ask for these at your next appointment.  1. Remove the bag and connecting tubing from their packaging. 2. Connect the funnel end of the tubing to the bag's cone-shaped stem. 3. Remove the IV cap from the biliary drain by unscrewing it and replace it with the screw-on end of the tubing. 4. Save the IV cap in a sealable plastic storage bag. HOW DO I EMPTY MY DRAINAGE BAG? The drainage bag should be emptied when it becomes 2/3 full or before you go to sleep. Most drainage bags have a drainage valve at the bottom that allows them to be emptied easily. 1. Hold the bag over the toilet or basin (or measuring container, if you are directed to measure the drainage). 2. Unscrew the valve to open it, and allow the bag to drain. 3. Close the valve securely to avoid leakage, and wipe it clean with a tissue or disposable napkin. SEEK MEDICAL CARE IF:  Your pain gets worse after an initial improvement.  You have any questions about your tube.  Your redness, soreness, or swelling at the tube insertion site gets worse despite good cleaning.  Your skin breaks down  around the tube.  You have leakage of bile around the tube.  Your tube becomes blocked or clogged.  Your catheter is dislodged or comes out.  You have a fever.  You have chills or increased pain. MAKE SURE YOU:  Understand these instructions.  Will watch your condition.  Will get help right away if you are not doing well or get worse.   This information is not intended to replace advice given to you by your health care provider. Make sure you discuss any questions you have with your health care provider.   Document Released: 05/06/2013 Document Revised: 07/21/2013 Document Reviewed: 05/06/2013 Elsevier Interactive Patient Education 2016 Elsevier Inc.  Bulb Drain Home Care A bulb drain consists of a thin rubber tube and a soft, round bulb that creates a gentle suction. The rubber tube is placed in the area where you had surgery. A bulb is attached to the end of the tube that is outside the body. The bulb drain removes excess fluid that normally builds up in a surgical wound after surgery. The color and amount of fluid will vary. Immediately after surgery, the fluid is bright red and is a little thicker than water. It may gradually change to a yellow or pink color and become more thin and water-like. When the amount decreases to about 1 or 2 tbsp in 24 hours, your health care provider will usually remove it. DAILY CARE  Keep the bulb flat (compressed) at all times, except while emptying it. The flatness creates suction. You can flatten the bulb by squeezing it firmly in the middle and then closing the cap.  Keep sites where the tube enters the skin dry and covered with a bandage (dressing).  Secure the tube 1-2 in (2.5-5.1 cm) below the insertion sites to keep it from pulling on your stitches. The tube is stitched in place and will not slip out.  Secure the bulb as directed by your health care provider.  For the first 3 days after surgery, there usually is more fluid in the bulb.  Empty the bulb whenever it becomes half full because the bulb does not create enough suction if it is too full. The bulb could also overflow. Write down how much fluid you remove each time you empty your drain. Add up the amount removed in 24 hours.  Empty the bulb at the same time every day once the amount of fluid decreases and you only need to empty it once a day. Write down the amounts and the 24-hour totals to give to your health care provider. This helps your health care provider know when the tubes can be removed. EMPTYING THE BULB DRAIN Before emptying the bulb, get a measuring cup, a piece of paper and a pen, and wash your hands.  Gently run your fingers down the tube (stripping) to empty any drainage from the tubing into the bulb. This may need to be done several times a day to clear the tubing of clots and tissue.  Open the bulb cap to release suction, which causes it to inflate. Do not touch the inside of the cap.  Gently run your fingers down the tube (stripping) to empty any drainage from the tubing into the bulb.  Hold the cap out of the way, and pour fluid into the measuring cup.   Squeeze the bulb to provide suction.  Replace the cap.   Check the tape that holds the tube to your skin. If it is becoming loose, you can remove the loose piece of tape and apply a new one. Then, pin the bulb to your shirt.   Write down the amount of fluid you emptied out. Write down the date and each time you emptied your bulb drain. (If there are 2 bulbs, note the amount of drainage from each bulb and keep the totals separate. Your health care provider will want to know the total amounts for each drain and which tube is draining more.)   Flush the fluid down the toilet and wash your hands.   Call your health care provider once you have less than 2 tbsp of fluid collecting in the bulb drain every 24 hours. If there is drainage around the tube site, change dressings and keep the area dry.  Cleanse around tube with sterile saline and place dry gauze around site. This gauze should be changed when it is soiled. If it stays clean and unsoiled, it should still be changed daily.  SEEK MEDICAL CARE IF:  Your drainage has a bad smell or is cloudy.   You have a fever.   Your drainage is increasing instead of decreasing.   Your tube fell out.   You have redness or swelling around the tube site.   You have drainage from a surgical wound.   Your bulb drain will not stay flat after you empty it.  MAKE SURE YOU:   Understand these instructions.  Will watch your condition.  Will get help right away if you are not doing well or get worse.   This information is not intended to replace advice given to you by your health care provider. Make sure you discuss any questions you have with your health care provider.   Document Released: 07/13/2000 Document Revised: 08/06/2014 Document Reviewed: 02/02/2015 Elsevier Interactive Patient Education 2016 [employer]vier Inc[employer]   RECORD DRAINAGE DAILY AND CALL IF THERE ARE ANY ISSUES WITH THE DRAINS.

## 2016-06-07 NOTE — Progress Notes (Signed)
Pt reports feeling better, says he feels more comfortable going home in AM, wants to have stiches out prior to discharge. Will discuss with surgery.   Debbora PrestoMAGICK-Indy Kuck, MD  [facility] Pager [phone]  If 7PM-7AM, please contact night-coverage [email or web address] Password [email or web address]

## 2016-06-08 LAB — CBC
HCT: 45 % (ref 39.0–52.0)
Hemoglobin: 15.1 g/dL (ref 13.0–17.0)
MCH: 29.7 pg (ref 26.0–34.0)
MCHC: 33.6 g/dL (ref 30.0–36.0)
MCV: 88.4 fL (ref 78.0–100.0)
PLATELETS: 360 10*3/uL (ref 150–400)
RBC: 5.09 MIL/uL (ref 4.22–5.81)
RDW: 13.4 % (ref 11.5–15.5)
WBC: 14.5 10*3/uL — ABNORMAL HIGH (ref 4.0–10.5)

## 2016-06-08 LAB — COMPREHENSIVE METABOLIC PANEL
ALT: 33 U/L (ref 17–63)
ANION GAP: 8 (ref 5–15)
AST: 17 U/L (ref 15–41)
Albumin: 3 g/dL — ABNORMAL LOW (ref 3.5–5.0)
Alkaline Phosphatase: 55 U/L (ref 38–126)
BUN: 13 mg/dL (ref 6–20)
CHLORIDE: 100 mmol/L — AB (ref 101–111)
CO2: 25 mmol/L (ref 22–32)
CREATININE: 0.84 mg/dL (ref 0.61–1.24)
Calcium: 8.5 mg/dL — ABNORMAL LOW (ref 8.9–10.3)
Glucose, Bld: 204 mg/dL — ABNORMAL HIGH (ref 65–99)
POTASSIUM: 3.9 mmol/L (ref 3.5–5.1)
Sodium: 133 mmol/L — ABNORMAL LOW (ref 135–145)
Total Bilirubin: 0.7 mg/dL (ref 0.3–1.2)
Total Protein: 5.9 g/dL — ABNORMAL LOW (ref 6.5–8.1)

## 2016-06-08 LAB — GLUCOSE, CAPILLARY: Glucose-Capillary: 177 mg/dL — ABNORMAL HIGH (ref 65–99)

## 2016-06-08 NOTE — Progress Notes (Signed)
9 Days Post-Op  Subjective: Doing well, No complaints, sites OK.  Drainage from JP looks bilious, but no complaints of pain.    Objective: Vital signs in last 24 hours: Temp:  [97.6 F (36.4 C)-98.7 F (37.1 C)] 97.6 F (36.4 C) (11/10 0606) Pulse Rate:  [63-66] 63 (11/10 0606) Resp:  [17-18] 18 (11/10 0606) BP: (126-137)/(66-79) 137/79 (11/10 0606) SpO2:  [98 %-100 %] 100 % (11/10 0606) Last BM Date: 06/07/16 240 PO 2100 urine JP Drain 160 T tube 500 Afebrile, VSS WBC improving Intake/Output from previous day: 11/09 0701 - 11/10 0700 In: 240 [P.O.:240] Out: 2760 [Urine:2100; Drains:660] Intake/Output this shift: No intake/output data recorded.  General appearance: alert, cooperative and no distress GI: soft, + BS, sites look good, we will get the staples out today.  Tolerating diet and + BM yesterday.    Lab Results:   Recent Labs  06/06/16 0442 06/08/16 0432  WBC 16.9* 14.5*  HGB 15.1 15.1  HCT 44.8 45.0  PLT 362 360    BMET  Recent Labs  06/06/16 0442 06/08/16 0432  NA  --  133*  K  --  3.9  CL  --  100*  CO2  --  25  GLUCOSE  --  204*  BUN  --  13  CREATININE 1.02 0.84  CALCIUM  --  8.5*   PT/INR No results for input(s): LABPROT, INR in the last 72 hours.   Recent Labs Lab 06/08/16 0432  AST 17  ALT 33  ALKPHOS 55  BILITOT 0.7  PROT 5.9*  ALBUMIN 3.0*     Lipase     Component Value Date/Time   LIPASE 19 05/29/2016 2246     Studies/Results: Dg Cholangiogram  Existing Tube  Result Date: 06/06/2016 CLINICAL DATA:  Status post repair of common duct injury during cholecystectomy. Seven days postop. EXAM: CATHETER CHOLANGIOGRAM CONTRAST:  12 cc of Isovue-300 FLUOROSCOPY TIME:  Number of Acquired Spot Images: 0 Fluoroscopy Time: 1  minutes 22 seconds COMPARISON:  CT of 05/30/2016. PROCEDURE: 12 cc of Isovue-300 were injected via the biliary catheter. FINDINGS: Preprocedure scout film demonstrates right upper quadrant biliary drain and  surgical drain. Upon injection of contrast into the biliary drain, there is prompt flow of contrast into the surgical drain. Normal caliber of the common duct. Subsequently, there is contrast extravasation within the proximal to mid common duct, including on series 5. This is relatively well contained. No biliary duct dilatation is seen. Contrast flows into the duodenum. IMPRESSION: 1. Relatively well contained contrast extravasation from the proximal common duct, draining into the surgical drain. 2. No evidence of biliary obstruction. These results will be called to the ordering clinician or representative by the Radiologist Assistant, and communication documented in the PACS or zVision Dashboard. Electronically Signed   By: Jeronimo GreavesKyle  Talbot M.D.   On: 06/06/2016 10:05    Medications: . enoxaparin (LOVENOX) injection  40 mg Subcutaneous Q24H  . insulin aspart  0-9 Units Subcutaneous TID WC  . insulin detemir  12 Units Subcutaneous Daily  . lip balm   Topical BID  . methadone  180 mg Oral QHS  . PARoxetine  30 mg Oral Daily  . polyethylene glycol  17 g Oral Daily    Assessment/Plan Gangrenous cholecystitis/ proximal common bile duct injury S/p Laparoscopic Cholecystectomy with IOC/ open common bile duct exploration/ placement of biliary T-tube, umbilical hernia repair, 05/30/16, Dr. Corliss Skainssuei POD 8 Chronic pain- Home dose Methadone 180 mg daily for chronic knee pain -  PO Dilaudid and Methadone for pain currently AODM - Insulin dependent Sleep apnea Anxiety/depression FEN:IV fluids/regular diet ID: Zosyn day 6 completed on 06/03/16, now off antibiotics DVT: Lovenox   Plan:  Staples out, benzoin steri strip the sites.  He can go home from our standpoint and follow up is in the AVS.    LOS: 9 days    Atiya Yera 06/08/2016 [phone]

## 2016-06-08 NOTE — Care Management Note (Signed)
Case Management Note  Patient Details  Name: Martin Downs MRN: [id number] Date of Birth: 02/15/1966  Subjective/Objective:                    Action/Plan: Pt discharging home with self care. No further needs per CM.   Expected Discharge Date:                  Expected Discharge Plan:  Home/Self Care  In-House Referral:     Discharge planning Services     Post Acute Care Choice:    Choice offered to:     DME Arranged:    DME Agency:     HH Arranged:    HH Agency:     Status of Service:  Completed, signed off  If discussed at [employer] Length of Stay Meetings, dates discussed:    Additional Comments:  [name], RN 06/08/2016, 10:23 AM

## 2016-06-08 NOTE — Progress Notes (Signed)
Pt discharged home in stable condition after going over discharge teaching with no concerns expressed

## 2016-06-08 NOTE — Progress Notes (Signed)
Inpatient Diabetes Program Recommendations  AACE/ADA: New Consensus Statement on Inpatient Glycemic Control (2015)  Target Ranges:  Prepandial:   less than 140 mg/dL      Peak postprandial:   less than 180 mg/dL (1-2 hours)      Critically ill patients:  140 - 180 mg/dL  Results for Martin Downs, Martin Downs (MRN [id number]) as of 06/08/2016 08:21  Ref. Range 06/07/2016 08:02 06/07/2016 12:28 06/07/2016 17:46 06/07/2016 22:03 06/08/2016 07:59  Glucose-Capillary Latest Ref Range: 65 - 99 mg/dL 401160 (H) 027185 (H) 253220 (H) 135 (H) 177 (H)    Review of Glycemic Control  Current orders for Inpatient glycemic control: Levemir 12 units daily, Novolog 0-9 units TID with meals  Inpatient Diabetes Program Recommendations: Diet: Please discontinue regular diet and order Carb Modified diet.  Thanks, Orlando PennerMarie Jonia Oakey, RN, MSN, CDE Diabetes Coordinator Inpatient Diabetes Program [phone] (Team Pager from 8am to 5pm)

## 2016-06-08 NOTE — Discharge Summary (Signed)
Physician Discharge Summary  Martin Downs ZOX:[id number] DOB: 03-09-1966 DOA: 05/30/2016  PCP: [name], MD  Admit date: 05/30/2016 Discharge date: 06/07/2016  Recommendations for Outpatient Follow-up:  1. Pt will need to follow up with PCP in 1-2 weeks post discharge 2. Please obtain BMP to evaluate electrolytes and kidney function 3. Check liver function enzymes  4. Please also check CBC to evaluate Hg and Hct levels 5. Please note that pt has extravasation from T tube but drained, this will heal in most cases 6. Per surgery team, Keep T tube to drain and surgery team arranged follow up, please see below   Discharge Diagnoses:  Principal Problem:   Acute gangrenous cholecystitis s/p cholecystectomy 05/30/2016 Active Problems:   Pure hypercholesterolemia   Generalized anxiety disorder   Narcotic dependence ([place])  Discharge Condition: Stable  Diet recommendation: Heart healthy diet discussed in details   Interim summary and HPI 50 y.o.malewith medical history significant for DM,HLD, chronic back pain opioid dependent on methadone, presenting to the ED with Right abdominal pain since 10/30 at night, accompanied by nausea without vomiting, with radiation to the right shoulder and back, poor oral intake. His pain continues to be increasingly worse this morning. Denies any similar symptoms in the past. Denies subjective fevers, chills, night sweats, vision changes, or mucositis. Denies any respiratory complaints. Denies any chest pain or palpitations. Denies lower extremity swelling. Denies change in bowel habits. Last bowel movement on 10/31 Denies any dysuria. Denies abnormal skin rashes, or neuropathy. Denies any bleeding issues such as epistaxis, hematemesis, hematuria or hematochezia. Ambulating without difficulty. No ETOH or tobacco .   Assessment/Plan: Sepsis due to Acute gangrenous cholecystitis. (met sepsis criteria on admission with RR of 26-27, elevated WBC's, HR in 110,  low grade temp and findings of cholecystitis in CT abdomen) - S/P laparoscopic cholecystectomy and placement of T-tube and JP drain - underwent cholangiogram today, results unremarkable, diet advanced, pt tolerating well  - has completed Zosyn on 81/1  Umbilical hernia - status post surgical repair - will continue PRN analgesics   Leukocytosis - Due to gangrenous cholecystitis, sepsis  - patient has concluded treatment with IV zosyn as recommended by general surgery  - no fevers overnight  Type II Diabetes: uncontrolled with hyperglycemia - A1C 8.4 - Continue holding home oral diabetic medications, resume upon discharge   Hyperlipidemia, mixed - Continue home statins  Opioid dependence for chronic back pain  - Continue methadone  - Will transition to PO analgesics and if pt tolerating well, can be discharge home in AM  Anxiety - Continue home Paxil   Code Status: Full Family Communication: no family at bedside  Disposition Plan: home    Consultants:  General surgery   Procedures:  See below for x-ray reports   Laparoscopic cholecystectomy with JP and T-tube placement 05/30/16  Antibiotics:  Zosyn 05/30/16>>>06/03/16  Procedures/Studies: Dg Cholangiogram Operative  Result Date: 05/30/2016 CLINICAL DATA:  Cholecystitis EXAM: INTRAOPERATIVE CHOLANGIOGRAM TECHNIQUE: Cholangiographic images from the C-arm fluoroscopic device were submitted for interpretation post-operatively. Please see the procedural report for the amount of contrast and the fluoroscopy time utilized. COMPARISON:  None. FINDINGS: Contrast fills the duodenum and biliary tree without filling defects in the common bile duct. Contrast fills the pancreatic duct. IMPRESSION: Patent biliary tree. Electronically Signed   By: Marybelle Killings M.D.   On: 05/30/2016 12:01   Ct Abdomen Pelvis W Contrast  Result Date: 05/30/2016 CLINICAL DATA:  Severe right-sided abdominal pain. Colonoscopy 5 days ago. EXAM:  CT ABDOMEN AND PELVIS WITH CONTRAST TECHNIQUE: Multidetector CT imaging of the abdomen and pelvis was performed using the standard protocol following bolus administration of intravenous contrast. CONTRAST:  155m ISOVUE-300 IOPAMIDOL (ISOVUE-300) INJECTION 61% COMPARISON:  None. FINDINGS: Lower chest: Mild linear right lung base opacities, probably atelectatic. No consolidation in the bases. No effusions. Hepatobiliary: Mild fatty infiltration of the liver without focal lesion. Probable gallbladder calculi. Moderate gallbladder mural thickening and pericholecystic inflammatory stranding. No bile duct dilatation. Pancreas: Unremarkable. No pancreatic ductal dilatation or surrounding inflammatory changes. Spleen: Normal in size without focal abnormality. Adrenals/Urinary Tract: Adrenal glands are unremarkable. No renal parenchymal lesions. 3 mm midpole left renal collecting system calculus. Ureters and urinary bladder are unremarkable. Stomach/Bowel: Stomach is within normal limits. Appendix appears normal. No evidence of bowel wall thickening, distention, or inflammatory changes. Vascular/Lymphatic: Aortic atherosclerosis. No enlarged abdominal or pelvic lymph nodes. Reproductive: Prostate is unremarkable. Other: Small fat containing umbilical hernia.  No ascites. Musculoskeletal: No fracture is seen. IMPRESSION: Cholelithiasis with mild gallbladder mural thickening and pericholecystic inflammatory stranding. This may represent acute cholecystitis. No bile duct dilatation. Electronically Signed   By: DAndreas NewportM.D.   On: 05/30/2016 04:38   Dg Cholangiogram  Existing Tube  Result Date: 06/06/2016 CLINICAL DATA:  Status post repair of common duct injury during cholecystectomy. Seven days postop. EXAM: CATHETER CHOLANGIOGRAM CONTRAST:  12 cc of Isovue-300 FLUOROSCOPY TIME:  Number of Acquired Spot Images: 0 Fluoroscopy Time: 1  minutes 22 seconds COMPARISON:  CT of 05/30/2016. PROCEDURE: 12 cc of Isovue-300  were injected via the biliary catheter. FINDINGS: Preprocedure scout film demonstrates right upper quadrant biliary drain and surgical drain. Upon injection of contrast into the biliary drain, there is prompt flow of contrast into the surgical drain. Normal caliber of the common duct. Subsequently, there is contrast extravasation within the proximal to mid common duct, including on series 5. This is relatively well contained. No biliary duct dilatation is seen. Contrast flows into the duodenum. IMPRESSION: 1. Relatively well contained contrast extravasation from the proximal common duct, draining into the surgical drain. 2. No evidence of biliary obstruction. These results will be called to the ordering clinician or representative by the Radiologist Assistant, and communication documented in the PACS or zVision Dashboard. Electronically Signed   By: KAbigail MiyamotoM.D.   On: 06/06/2016 10:05     Discharge Exam: Vitals:   06/07/16 2006 06/08/16 0606  BP: 130/66 137/79  Pulse: 64 63  Resp: 17 18  Temp: 98.6 F (37 C) 97.6 F (36.4 C)   Vitals:   06/07/16 0403 06/07/16 1259 06/07/16 2006 06/08/16 0606  BP: 119/73 126/70 130/66 137/79  Pulse: 70 66 64 63  Resp: '18 17 17 18  '$ Temp: 97.7 F (36.5 C) 98.7 F (37.1 C) 98.6 F (37 C) 97.6 F (36.4 C)  TempSrc: Oral Oral Oral Oral  SpO2: 100% 100% 98% 100%  Weight:      Height:        General: Pt is alert, follows commands appropriately, not in acute distress Cardiovascular: Regular rate and rhythm, S1/S2 +, no murmurs, no rubs, no gallops Respiratory: Clear to auscultation bilaterally, no wheezing, no crackles, no rhonchi Abdominal: Soft, non tender, non distended, bowel sounds +, no guarding  Discharge Instructions  Discharge Instructions    Diet - low sodium heart healthy    Complete by:  As directed    Increase activity slowly    Complete by:  As directed  Medication List    TAKE these medications   aspirin 81 MG  tablet Take 81 mg by mouth daily.   B-D INS SYRINGE 0.5CC/31GX5/16 31G X 5/16" 0.5 ML Misc Generic drug:  Insulin Syringe-Needle U-100 USE 3 TIMES DAILY   BASAGLAR KWIKPEN 100 UNIT/ML Sopn Inject 0.15 mLs (15 Units total) into the skin at bedtime.   FARXIGA 10 MG Tabs tablet Generic drug:  dapagliflozin propanediol TAKE 1 TABLET BY MOUTH NIGHTLY   fenofibrate 160 MG tablet TAKE 1 TABLET (160 MG TOTAL) BY MOUTH DAILY.   fluticasone 50 MCG/ACT nasal spray Commonly known as:  FLONASE PLACE 2 SPRAYS INTO BOTH NOSTRILS DAILY.   HYDROmorphone 4 MG tablet Commonly known as:  DILAUDID Take 1 tablet (4 mg total) by mouth every 4 (four) hours as needed for severe pain.   Insulin Detemir 100 UNIT/ML Pen Commonly known as:  LEVEMIR FLEXPEN Inject 30 Units into the skin every morning. What changed:  how much to take   Insulin Pen Needle 32G X 4 MM Misc Use to inject insulin 1 time daily as instructed.   metFORMIN 1000 MG tablet Commonly known as:  GLUCOPHAGE TAKE 1 TABLET (1,000 MG TOTAL) BY MOUTH 2 (TWO) TIMES DAILY WITH A MEAL.   methadone 10 MG/ML solution Commonly known as:  DOLOPHINE Take 180 mg by mouth daily.   multivitamin tablet Take 2 tablets by mouth 2 (two) times daily.   ONE TOUCH ULTRA MINI w/Device Kit Use as directed to test blood sugar twice daily 250.00   ONE TOUCH ULTRA TEST test strip Generic drug:  glucose blood USE 8 TIMES A DAY AS INSTRUCTED   ONETOUCH DELICA LANCETS 71G Misc USE TO TEST BLOOD SUGAR 4 TIMES DAILY AS INSTRUCTED.   PARoxetine 30 MG tablet Commonly known as:  PAXIL Take 1 tablet (30 mg total) by mouth daily.   rosuvastatin 10 MG tablet Commonly known as:  CRESTOR Take 10 mg by mouth daily.   sildenafil 100 MG tablet Commonly known as:  VIAGRA Take 0.5-1 tablets (50-100 mg total) by mouth daily as needed for erectile dysfunction.   SYRINGE-NEEDLE (DISP) 3 ML 18G X 1-1/2" 3 ML Misc Use to inject testosterone.   TANZEUM 50 MG  Pen Generic drug:  Albiglutide INJECT 1 SYRINGEGUL ('50MG'$ ) UNDER SKIN ONCE A WEEK   testosterone cypionate 200 MG/ML injection Commonly known as:  DEPOTESTOSTERONE CYPIONATE INJECT 0.25 MILLILITERS INTRAMUSCULARLY ONCE PER WEEK   vitamin B-12 1000 MCG tablet Commonly known as:  CYANOCOBALAMIN Take 1,000 mcg by mouth daily.      Follow-up Information    [name], MD Follow up.   Specialty:  Family Medicine Contact information: [place] [address] [phone]        Maia Petties., MD Follow up on 06/29/2016.   Specialty:  General Surgery Why:  Your appointment is at 10:50 AM, be at the office 30 minutes early for check in. Our office will set up repeat Cholangiogram and contact your about having it done before you return to see Dr. Georgette Dover.  Contact information: [place] [address] [phone]            The results of significant diagnostics from this hospitalization (including imaging, microbiology, ancillary and laboratory) are listed below for reference.     Microbiology: No results found for this or any previous visit (from the past 240 hour(s)).   Labs: Basic Metabolic Panel:  Recent Labs Lab 06/03/16 0505 06/05/16  9295 06/06/16 0442 06/08/16 0432  NA 135 137  --  133*  K 4.9 4.6  --  3.9  CL 100* 101  --  100*  CO2 30 25  --  25  GLUCOSE 152* 151*  --  204*  BUN 14 11  --  13  CREATININE 0.79 0.84 1.02 0.84  CALCIUM 8.3* 9.1  --  8.5*   Liver Function Tests:  Recent Labs Lab 06/08/16 0432  AST 17  ALT 33  ALKPHOS 55  BILITOT 0.7  PROT 5.9*  ALBUMIN 3.0*   CBC:  Recent Labs Lab 06/03/16 0505 06/05/16 0647 06/06/16 0442 06/08/16 0432  WBC 12.2* 17.3* 16.9* 14.5*  HGB 13.5 14.2 15.1 15.1  HCT 40.8 43.2 44.8 45.0  MCV 90.7 90.2 89.8 88.4  PLT 276 364 362 360   CBG:  Recent Labs Lab 06/07/16 0802 06/07/16 1228 06/07/16 1746 06/07/16 2203 06/08/16 0759  GLUCAP 160* 185*  220* 135* 177*   SIGNED: Time coordinating discharge: 30 minutes  MAGICK-Kinser Fellman, MD  [facility] 06/08/2016, 9:59 AM Pager [phone]  If 7PM-7AM, please contact night-coverage [email or web address] Password [email or web address]

## 2016-06-11 ENCOUNTER — Telehealth: Payer: Self-pay | Admitting: *Deleted

## 2016-06-11 NOTE — Telephone Encounter (Signed)
Transition Care Management Follow-up Telephone Call  Per Discharge Summary: PCP: Ruthe Mannanalia Aron, MD  Admit date: 05/30/2016 Discharge date: 06/07/2016  Recommendations for Outpatient Follow-up:  1. Pt will need to follow up with PCP in 1-2 weeks post discharge 2. Please obtain BMP to evaluate electrolytes and kidney function 3. Check liver function enzymes  4. Please also check CBC to evaluate Hg and Hct levels 5. Please note that pt has extravasation from T tube but drained, this will heal in most cases 6. Per surgery team, Keep T tube to drain and surgery team arranged follow up, please see below   Discharge Diagnoses:  Principal Problem:   Acute gangrenous cholecystitis s/p cholecystectomy 05/30/2016 Active Problems:   Pure hypercholesterolemia   Generalized anxiety disorder   Narcotic dependence ([facility])  Discharge Condition: Stable  Diet recommendation: Heart healthy diet discussed in details   --   How have you been since you were released from the hospital? "A little improvement every day."   Do you understand why you were in the hospital? yes   Do you understand the discharge instructions? yes   Where were you discharged to? Home   Items Reviewed:  Medications reviewed: yes  Allergies reviewed: yes  Dietary changes reviewed: no, none made per pt  Referrals reviewed: no, none made   Functional Questionnaire:   Activities of Daily Living (ADLs):   He states they are independent in the following: ambulation, feeding, continence, grooming, toileting and dressing States they require assistance with the following: bathing and hygiene-pt's wife is helping with this   Any transportation issues/concerns?: no   Any patient concerns? no   Confirmed importance and date/time of follow-up visits scheduled yes  Provider Appointment booked with Dr. Ruthe Mannanalia Aron 06/13/16 @ 11:30am   Confirmed with patient if condition begins to worsen call PCP or go to the ER.   Patient was given the office number and encouraged to call back with question or concerns.  : yes

## 2016-06-13 ENCOUNTER — Other Ambulatory Visit (HOSPITAL_COMMUNITY): Payer: Self-pay | Admitting: Surgery

## 2016-06-13 ENCOUNTER — Ambulatory Visit: Payer: 59 | Admitting: Family Medicine

## 2016-06-13 DIAGNOSIS — Z9049 Acquired absence of other specified parts of digestive tract: Secondary | ICD-10-CM

## 2016-06-13 DIAGNOSIS — Z0289 Encounter for other administrative examinations: Secondary | ICD-10-CM

## 2016-06-26 ENCOUNTER — Ambulatory Visit (HOSPITAL_COMMUNITY)
Admission: RE | Admit: 2016-06-26 | Discharge: 2016-06-26 | Disposition: A | Discharge status: Home / Self Care | Payer: 59 | Source: Ambulatory Visit | Attending: Surgery | Admitting: Surgery

## 2016-06-26 DIAGNOSIS — Z9049 Acquired absence of other specified parts of digestive tract: Secondary | ICD-10-CM | POA: Insufficient documentation

## 2016-06-26 DIAGNOSIS — R938 Abnormal findings on diagnostic imaging of other specified body structures: Secondary | ICD-10-CM | POA: Insufficient documentation

## 2016-06-26 MED ORDER — IOPAMIDOL (ISOVUE-300) INJECTION 61%
50.0000 mL | Freq: Once | INTRAVENOUS | Status: AC | PRN
Start: 2016-06-26 — End: 2016-06-26
  Administered 2016-06-26: 12 mL

## 2016-06-26 MED ORDER — SODIUM CHLORIDE 0.9 % IJ SOLN
25.0000 mL | Freq: Once | INTRAMUSCULAR | Status: AC
  Administered 2016-06-26: 11 mL via INTRAVENOUS

## 2016-07-02 ENCOUNTER — Ambulatory Visit: Payer: 59 | Admitting: Family Medicine

## 2016-07-03 ENCOUNTER — Encounter: Payer: Self-pay | Admitting: Internal Medicine

## 2016-07-03 ENCOUNTER — Ambulatory Visit (INDEPENDENT_AMBULATORY_CARE_PROVIDER_SITE_OTHER): Payer: 59 | Admitting: Internal Medicine

## 2016-07-03 VITALS — BP 147/84 | HR 92 | Wt 208.0 lb

## 2016-07-03 DIAGNOSIS — IMO0002 Reserved for concepts with insufficient information to code with codable children: Secondary | ICD-10-CM

## 2016-07-03 DIAGNOSIS — E1121 Type 2 diabetes mellitus with diabetic nephropathy: Secondary | ICD-10-CM | POA: Diagnosis not present

## 2016-07-03 DIAGNOSIS — E230 Hypopituitarism: Secondary | ICD-10-CM | POA: Diagnosis not present

## 2016-07-03 DIAGNOSIS — E1165 Type 2 diabetes mellitus with hyperglycemia: Secondary | ICD-10-CM | POA: Diagnosis not present

## 2016-07-03 MED ORDER — PIOGLITAZONE HCL 15 MG PO TABS: 15.0000 mg | ORAL_TABLET | Freq: Every day | ORAL | 3 refills | Status: DC

## 2016-07-03 NOTE — Progress Notes (Signed)
Patient ID: Martin Downs, male   DOB: 05/23/1966, 50 y.o.   MRN: [id number]  HPI: Martin Downs is a 50 y.o.-year-old male, returning for f/u for DM2 dx 2014, insulin-dependent since 02/2014, uncontrolled, with complications (mild CKD). Also,  Hypogonadotropic hypogonadism. Last visit 3 mo ago.  He had open cholecystectomy 05/2016 - gangrenous GB.  DM2: Last hemoglobin A1c was: Lab Results  Component Value Date   HGBA1C 8.4 (H) 05/30/2016   HGBA1C 8.4 (H) 03/01/2016   HGBA1C 8.4 01/19/2016   Pt was on a regimen of: - Metformin 1000 mg bid - Levemir 40 units 2x a day  - NovoLog 15 min before a meal:  - 30 units before a smaller meal - 35 units before a larger meal    Novolog 15 units for a snack. We stopped Actoplus 15-850 mg in 02/2014. We stopped Glipizide when we increased Novolog. Insurance does not cover Invokana.  He takes: - Metformin 1000 mg bid - Tanzeum 50 mg weekly - Farxiga 10 mg daily in am (added 09/2015) - Basaglar 25 units at bedtime  Pt checks sugars 2-3x a day - still gets widely different values depending on which finger he checks: - am: 208-380 >> 196-222 >> 69, 100, 204-340 >> 100, 104-304 >> 168-248 >> 117-240, 325 >> 138-289 >> 170-200s - 2h after b'fast:284-365, 441, 481 >> 210-419 >> 166-395 >> 216-244 >> 166-300, 380 >> n/c >> 114 >> n/c - lunch: 200-305 >> 175-298 >> 204-237 >> 125, 159-300 >> 119, 281-320 >> 196, 218-266 >> n/c >> 115-191 >> 150-200 - 2h after lunch: 280-421 >> 149-199, 390 >> 228-261 >> 205-307 >> 210-252, 300 >> n/c  - dinner: 147-220 >> 218-240 >> 94, 164-328 >> 122-242, 288 >> 142, 162-230, 297, 515 >> 140-260 >> 99, 189 >> 150-200 - 2h after dinner: n/c >> 175-307 >> 237-349 >> 155-314 >> 228-260 >> 241-379 >> n/c - bedtime: 208-340 >> 166-270 >> 207-225 >> 211-281 >> 185-254 >> 155, 201-290 >> 195-253 >> 110-212 >> 200-220 - nighttime: 88 (sweating), 187-443 >> 394 >> 190-240 >> n/c >> 180-203, 354 >> n/c >> 130-218 >>  n/c  Meter: One Touch Ultra mini  Pt's meals are - grazes! - Breakfast: oatmeal or yoghurt or fruit bar/cup (largest meal) - Lunch: PB sandwich - Dinner: chicken leftovers - Snacks: sandwich, 1 pack of crackers, diet Mountain dew  - Mild CKD, last BUN/creatinine:  Lab Results  Component Value Date   BUN 13 06/08/2016   CREATININE 0.84 06/08/2016  Not on an ACEI. - last set of lipids: Lab Results  Component Value Date   CHOL 133 03/01/2016   HDL 33.10 (L) 03/01/2016   LDLCALC 64 03/01/2016   LDLDIRECT 136.0 11/01/2014   TRIG 178.0 (H) 03/01/2016   CHOLHDL 4 03/01/2016  On Crestor and Fenofibrate. - last eye exam was on 12/2015. No DR. - no numbness and tingling in his feet.  Hypogonadotropic hypogonadism: - He was on Androgel in 2015 >> level did not increase and he did not feel better >> now on im testosterone 50 mg weekly  Reviewed levels - latest free T level normal: Component     Latest Ref Rng & Units 07/18/2015 10/18/2015 10/18/2015 01/16/2016          7:52 AM  7:52 AM   RBC     4.22 - 5.81 Mil/uL  5.08    Platelets     150.0 - 400.0 K/uL  266.0    Hemoglobin  13.0 - 17.0 g/dL  40.915.0    HCT     81.139.0 - 52.0 %  45.6    Testosterone     250 - 827 ng/dL 914131 (L) 782949 (H) 956949 (H) 1,007 (H)  Albumin     3.6 - 5.1 g/dL   4.5 4.4  Sex Horm Binding Glob, Serum     10 - 50 nmol/L 34 25 25 28   Testosterone Free     47.0 - 244.0 pg/mL 23.9 (L) 259.1 (H) 186.3 185.8  Testosterone, Bioavailable     130.5 - 681.7 ng/dL   213.0383.1 865.7374.1  Testosterone-% Free     1.6 - 2.9 % 1.8 2.7    PSA     0.10 - 4.00 ng/mL  0.91     Component     Latest Ref Rng & Units 03/01/2016  PSA     0.10 - 4.00 ng/mL 0.69   - We checked his pituitary function >>  Labs normal except inappropriately normal LH, FSH , and low  Testosterone.  PRL higher, but Monomeric prolactin normal.   Component     Latest Ref Rng 05/16/2015  Testosterone     300 - 890 ng/dL 846132 (L)  Sex Hormone Binding      10 - 50 nmol/L 28  Testosterone Free     47.0 - 244.0 pg/mL 26.9 (L)  Testosterone-% Free     1.6 - 2.9 % 2.0  IGF-I, LC/MS     50 - 317 ng/mL 99  Z-Score (Male)     -2.0-+2.0 SD -0.7  Prolactin, Total     2.0 - 18.0 ng/mL 35.4 (H)  Prolactin, Monomeric     3.4 - 14.8 ng/mL 11.4  LH     1.50 - 9.30 mIU/mL 2.03  FSH     1.4 - 18.1 mIU/ML 5.9  Cortisol, Plasma      14.2  C206 ACTH     6 - 50 pg/mL 32   Previous labs (drawn at 8:33 AM)   Prolactin 38.6 (2.1-17.1)  FSH 5.6, LH 2.0  Total testosterone 134 (300-890), bioavailable testosterone 35.5 ng/dL (962.9-528.4130.5-681.7), free testosterone 18 pg/mL (47-244), SHBG 29 (10-50)  PSA 0.16  Estradiol 17.1 (0-39)  He had a pituitary MRI 07/14/2015) >>  No pituitary lesions.  Hyperprolactinemia: - found during investigation for low testosterone, by Dr. Mena GoesEskridge, his urologist. -  Turned out to be caused by macroprolactin, which does not require further investigation  Pt does c/o fatigue, low libido and problems with erections, but no gynecomastia or galactorrhea.   I reviewed pt's medications, allergies, PMH, social hx, family hx, and changes were documented in the history of present illness. Otherwise, unchanged from my initial visit note. He started Paxil, Viagra.  ROS: Constitutional: no weight gain/loss,no fatigue, + subjective hyperthermia Eyes: no blurry vision, no xerophthalmia ENT: no sore throat, no nodules palpated in throat, no dysphagia/odynophagia, no hoarseness Cardiovascular: no CP/SOB/palpitations/leg swelling Respiratory: no cough/SOB Gastrointestinal: no N/no V/D/C Musculoskeletal: no muscle/joint aches Skin: no rashes Neurological: + tremors/no numbness/tingling/dizziness  PE: BP (!) 147/84   Pulse 92   Wt 208 lb (94.3 kg)   BMI 30.72 kg/m  Body mass index is 30.72 kg/m.  Wt Readings from Last 3 Encounters:  07/03/16 208 lb (94.3 kg)  05/29/16 220 lb (99.8 kg)  05/25/16 222 lb (100.7 kg)   Constitutional: overweight, in NAD Eyes: PERRLA, EOMI, no exophthalmos ENT: moist mucous membranes, no thyromegaly, no cervical lymphadenopathy Cardiovascular: RRR, No MRG Respiratory:  CTA B Gastrointestinal: abdomen soft, NT, ND, BS+ Musculoskeletal: no deformities, strength intact in all 4 Skin: moist, warm, no rashes Neurological: no tremor with outstretched hands, DTR normal in all 4  ASSESSMENT: 1. DM2, insulin-dependent, uncontrolled, with complications - mild ckd  - We discussed in the past about a VGO system or another type of pump >> but he works outside and sweats a lot >> he does not think he can wear this  2. Hypogonadotropic hypogonadism  PLAN:  1. Patient with h/o uncontrolled diabetes, with increased insulin resistance. Sugars were worse after starting Levemir (!) >> he stopped. We added Basaglar at last visit. No change in the sugars or the HbA1c! Will try to add back Actos but if not improving sugars >> we may need low doses of the U500 insulin (he agrees). - latest HbA1c 8.4% (stable) - I suggested to:  Patient Instructions  Please continue: - Basaglar 25 units at bedtime.  - Metformin 1000 mg 2x daily - Tanzeum 50 mg weekly - Farxiga 10 mg daily in am   Please restart Actos 15 mg daily. You can increase it to 30 mg daily.   Continue: - Testosterone injection 50 mg weekly.  Please return in 3 months with your sugar log.   - continue checking sugars at different times of the day - check 3 times a day, rotating checks - advised for yearly eye exams - he is UTD - refuses flu shot today - Return to clinic in 3 mo with sugar log.   2. Hypogonadotropic Hypogonadism - now on Testosterone inj weekly (50 mg) - latest 2 testosterone levels normal, as was his PSA and Hb. >> will repeat at next visit - Pituitary MRI normal  - reviewed rest of the pituitary tests >> normal - per DRE from urologist >> he does have BPH.  He needs all labs to go through  [facility].  Carlus Pavlov, MD PhD [facility] Endocrinology

## 2016-07-03 NOTE — Patient Instructions (Addendum)
Please continue: - Basaglar 25 units at bedtime.  - Metformin 1000 mg 2x daily - Tanzeum 50 mg weekly - Farxiga 10 mg daily in am   Please restart Actos 15 mg daily. You can increase it to 30 mg daily.   Continue: - Testosterone injection 50 mg weekly.  Please return in 3 months with your sugar log.

## 2016-07-05 ENCOUNTER — Ambulatory Visit (INDEPENDENT_AMBULATORY_CARE_PROVIDER_SITE_OTHER): Payer: 59 | Admitting: Family Medicine

## 2016-07-05 ENCOUNTER — Encounter: Payer: Self-pay | Admitting: Family Medicine

## 2016-07-05 VITALS — BP 122/72 | HR 70 | Temp 98.0°F | Wt 208.0 lb

## 2016-07-05 DIAGNOSIS — K810 Acute cholecystitis: Secondary | ICD-10-CM

## 2016-07-05 DIAGNOSIS — K429 Umbilical hernia without obstruction or gangrene: Secondary | ICD-10-CM

## 2016-07-05 DIAGNOSIS — Z9049 Acquired absence of other specified parts of digestive tract: Secondary | ICD-10-CM | POA: Diagnosis not present

## 2016-07-05 DIAGNOSIS — K838 Other specified diseases of biliary tract: Secondary | ICD-10-CM | POA: Diagnosis not present

## 2016-07-05 DIAGNOSIS — IMO0002 Reserved for concepts with insufficient information to code with codable children: Secondary | ICD-10-CM

## 2016-07-05 DIAGNOSIS — E1121 Type 2 diabetes mellitus with diabetic nephropathy: Secondary | ICD-10-CM | POA: Diagnosis not present

## 2016-07-05 DIAGNOSIS — K4090 Unilateral inguinal hernia, without obstruction or gangrene, not specified as recurrent: Secondary | ICD-10-CM | POA: Insufficient documentation

## 2016-07-05 DIAGNOSIS — E1165 Type 2 diabetes mellitus with hyperglycemia: Secondary | ICD-10-CM

## 2016-07-05 LAB — CBC WITH DIFFERENTIAL/PLATELET
BASOS PCT: 0.6 % (ref 0.0–3.0)
Basophils Absolute: 0.1 10*3/uL (ref 0.0–0.1)
EOS ABS: 0.4 10*3/uL (ref 0.0–0.7)
EOS PCT: 3.6 % (ref 0.0–5.0)
HEMATOCRIT: 45.3 % (ref 39.0–52.0)
HEMOGLOBIN: 14.7 g/dL (ref 13.0–17.0)
LYMPHS PCT: 21.4 % (ref 12.0–46.0)
Lymphs Abs: 2.6 10*3/uL (ref 0.7–4.0)
MCHC: 32.5 g/dL (ref 30.0–36.0)
MCV: 88.6 fl (ref 78.0–100.0)
Monocytes Absolute: 0.9 10*3/uL (ref 0.1–1.0)
Monocytes Relative: 7.6 % (ref 3.0–12.0)
Neutro Abs: 8 10*3/uL — ABNORMAL HIGH (ref 1.4–7.7)
Neutrophils Relative %: 66.8 % (ref 43.0–77.0)
Platelets: 343 10*3/uL (ref 150.0–400.0)
RBC: 5.12 Mil/uL (ref 4.22–5.81)
RDW: 14.7 % (ref 11.5–15.5)
WBC: 12 10*3/uL — AB (ref 4.0–10.5)

## 2016-07-05 NOTE — Progress Notes (Signed)
Pre visit review using our clinic review tool, if applicable. No additional management support is needed unless otherwise documented below in the visit note. 

## 2016-07-05 NOTE — Assessment & Plan Note (Signed)
S/p repair

## 2016-07-05 NOTE — Assessment & Plan Note (Signed)
See below. Follow up with surgery tomorrw.

## 2016-07-05 NOTE — Progress Notes (Signed)
Subjective:   Patient ID: Martin Downs, male    DOB: 08/11/1965, 50 y.o.   MRN: [id number]  Martin Downs is a pleasant 50 y.o. year old male who presents to clinic today with Hospitalization Follow-up  on 07/05/2016  HPI:  Admitted 05/30/2016- 06/07/2016- notes reviewed.  Presented to ED with abdominal pain since 10/30 with nausea and vomiting.  Found to be septic with acute gangrenous cholecystitis. Had lap chole and placement of T tube and JP drains -2 (Dr. Rush Farmer) Umbilical hernia was also repaired.  Was also on Zosyn.  Saw Dr. Cruzita Lederer on 07/03/16 for DM follow up- note reviewed. Actos added to rxs.  Still has one drain drain in place-  Has follow up with Dr. Rush Farmer tomorrow.  Appetite and pain are both getting better. At times, does feel like he still has a low grade fever.  Pedal edema was significant in the hospital but it is improving.  Lab Results  Component Value Date   WBC 14.5 (H) 06/08/2016   HGB 15.1 06/08/2016   HCT 45.0 06/08/2016   MCV 88.4 06/08/2016   PLT 360 06/08/2016     Dg Cholangiogram  Existing Tube  Result Date: 06/26/2016 INDICATION: CBD injury, prior leak EXAM: CATHETER CHOLANGIOGRAM CONTRAST:  CONTRAST 23 cc Isovue-300 and saline solution. FLUOROSCOPY TIME:  Fluoroscopy Time: 1 minutes 24 seconds (49.9 mGy). FINDINGS: FINDINGS The patient's T2 was accessed with a butterfly catheter and injected. There is fairly prompt extravasation of contrast from the proximal common duct in the vicinity of the entry site of the catheter, in addition to filling of the biliary tree. The JP drainage catheter was noted to fairly promptly fill with contrast after this extravasation occurred. Contrast was also noted to drain into the duodenum and to fill the rest of the biliary tree. There is no dilatation of the biliary tree. IMPRESSION: Persistent leak of the proximal common bile duct not far from the due to of entry site, drained by the JP drain,  contrast in the JP drain observed. The patient notes that the JP drain has not had much drainage at all over the last several days, that it may be that the elevated pressures associated with gentle injection caused leakage not present at physiologic pressures. There is no distal obstruction of the CBD, drainage to the duodenum is observed. Electronically Signed   By: Van Clines M.D.   On: 06/26/2016 11:17   Dg Cholangiogram  Existing Tube  Result Date: 06/06/2016 CLINICAL DATA:  Status post repair of common duct injury during cholecystectomy. Seven days postop. EXAM: CATHETER CHOLANGIOGRAM CONTRAST:  12 cc of Isovue-300 FLUOROSCOPY TIME:  Number of Acquired Spot Images: 0 Fluoroscopy Time: 1  minutes 22 seconds COMPARISON:  CT of 05/30/2016. PROCEDURE: 12 cc of Isovue-300 were injected via the biliary catheter. FINDINGS: Preprocedure scout film demonstrates right upper quadrant biliary drain and surgical drain. Upon injection of contrast into the biliary drain, there is prompt flow of contrast into the surgical drain. Normal caliber of the common duct. Subsequently, there is contrast extravasation within the proximal to mid common duct, including on series 5. This is relatively well contained. No biliary duct dilatation is seen. Contrast flows into the duodenum. IMPRESSION: 1. Relatively well contained contrast extravasation from the proximal common duct, draining into the surgical drain. 2. No evidence of biliary obstruction. These results will be called to the ordering clinician or representative by the Radiologist Assistant, and communication documented in the PACS or zVision Dashboard.  Electronically Signed   By: Abigail Miyamoto M.D.   On: 06/06/2016 10:05   Current Outpatient Prescriptions on File Prior to Visit  Medication Sig Dispense Refill  . aspirin 81 MG tablet Take 81 mg by mouth daily.      . B-D INS SYRINGE 0.5CC/31GX5/16 31G X 5/16" 0.5 ML MISC USE 3 TIMES DAILY 100 each 5  . Blood  Glucose Monitoring Suppl (ONE TOUCH ULTRA MINI) W/DEVICE KIT Use as directed to test blood sugar twice daily 250.00 1 each 0  . FARXIGA 10 MG TABS tablet TAKE 1 TABLET BY MOUTH NIGHTLY 30 tablet 3  . fenofibrate 160 MG tablet TAKE 1 TABLET (160 MG TOTAL) BY MOUTH DAILY. 90 tablet 1  . fluticasone (FLONASE) 50 MCG/ACT nasal spray PLACE 2 SPRAYS INTO BOTH NOSTRILS DAILY. 16 g 3  . HYDROmorphone (DILAUDID) 4 MG tablet Take 1 tablet (4 mg total) by mouth every 4 (four) hours as needed for severe pain. 15 tablet 0  . Insulin Detemir (LEVEMIR FLEXPEN) 100 UNIT/ML Pen Inject 30 Units into the skin every morning. (Patient taking differently: Inject 25 Units into the skin every morning. ) 15 mL 2  . Insulin Glargine (BASAGLAR KWIKPEN) 100 UNIT/ML SOPN Inject 0.15 mLs (15 Units total) into the skin at bedtime. 5 pen 2  . Insulin Pen Needle 32G X 4 MM MISC Use to inject insulin 1 time daily as instructed. 200 each 2  . metFORMIN (GLUCOPHAGE) 1000 MG tablet TAKE 1 TABLET (1,000 MG TOTAL) BY MOUTH 2 (TWO) TIMES DAILY WITH A MEAL. 180 tablet 0  . methadone (DOLOPHINE) 10 MG/ML solution Take 180 mg by mouth daily.     . Multiple Vitamin (MULTIVITAMIN) tablet Take 2 tablets by mouth 2 (two) times daily.    . ONE TOUCH ULTRA TEST test strip USE 8 TIMES A DAY AS INSTRUCTED 300 each 5  . ONETOUCH DELICA LANCETS 51O MISC USE TO TEST BLOOD SUGAR 4 TIMES DAILY AS INSTRUCTED. 200 each 4  . PARoxetine (PAXIL) 30 MG tablet Take 1 tablet (30 mg total) by mouth daily. 30 tablet 3  . pioglitazone (ACTOS) 15 MG tablet Take 1 tablet (15 mg total) by mouth daily. 180 tablet 3  . rosuvastatin (CRESTOR) 10 MG tablet Take 10 mg by mouth daily.     . sildenafil (VIAGRA) 100 MG tablet Take 0.5-1 tablets (50-100 mg total) by mouth daily as needed for erectile dysfunction. 5 tablet 11  . SYRINGE-NEEDLE, DISP, 3 ML 18G X 1-1/2" 3 ML MISC Use to inject testosterone. 100 each 1  . TANZEUM 50 MG PEN INJECT 1 SYRINGEGUL (50MG) UNDER SKIN  ONCE A WEEK 4 each 2  . testosterone cypionate (DEPOTESTOSTERONE CYPIONATE) 200 MG/ML injection INJECT 0.25 MILLILITERS INTRAMUSCULARLY ONCE PER WEEK 10 mL 2  . vitamin B-12 (CYANOCOBALAMIN) 1000 MCG tablet Take 1,000 mcg by mouth daily.     Current Facility-Administered Medications on File Prior to Visit  Medication Dose Route Frequency Provider Last Rate Last Dose  . 0.9 %  sodium chloride infusion  500 mL Intravenous Continuous Milus Banister, MD        Allergies  Allergen Reactions  . Gemfibrozil Diarrhea    Past Medical History:  Diagnosis Date  . Anxiety   . Borderline diabetes   . Chronic pain   . Depression   . Diabetes mellitus without complication ([place])   . Hyperlipidemia   . Narcotic dependence ([place])   . Sleep apnea    no cpap  Past Surgical History:  Procedure Laterality Date  . CHOLECYSTECTOMY N/A 05/30/2016   Procedure: LAPAROSCOPIC CHOLECYSTECTOMY WITH INTRAOPERATIVE CHOLANGIOGRAM;  Surgeon: Donnie Mesa, MD;  Location: [place];  Service: General;  Laterality: N/A;  . CHOLECYSTECTOMY N/A 05/30/2016   Procedure: OPEN COMMON BILE DUCT EXPLORATION; INSERTION OF T-TUBE;  Surgeon: Donnie Mesa, MD;  Location: [place];  Service: General;  Laterality: N/A;  . removal of bone spur Bilateral 1995   elbps  . TOOTH EXTRACTION  2017  . UMBILICAL HERNIA REPAIR N/A 05/30/2016   Procedure: HERNIA REPAIR UMBILICAL ADULT;  Surgeon: Donnie Mesa, MD;  Location: [facility];  Service: General;  Laterality: N/A;    Family History  Problem Relation Age of Onset  . Heart disease Father   . Cancer Father   . Colon cancer Neg Hx     Social History   Social History  . Marital status: Married    Spouse name: N/A  . Number of children: Y  . Years of education: N/A   Occupational History  . Dealer Unemployed   Social History Main Topics  . Smoking status: Never Smoker  . Smokeless tobacco: Current User    Types: Snuff  . Alcohol use No  . Drug use: No  . Sexual activity:  Not on file   Other Topics Concern  . Not on file   Social History Narrative  . No narrative on file   The PMH, PSH, Social History, Family History, Medications, and allergies have been reviewed in [facility], and have been updated if relevant.   Review of Systems  Constitutional: Positive for fever.  Gastrointestinal: Positive for abdominal pain. Negative for abdominal distention, anal bleeding, blood in stool, constipation, diarrhea, nausea and vomiting.  Musculoskeletal: Negative.   Neurological: Negative.   Hematological: Negative.   Psychiatric/Behavioral: Negative.   All other systems reviewed and are negative.      Objective:    BP 122/72   Pulse 70   Temp 98 F (36.7 C) (Oral)   Wt 208 lb (94.3 kg)   SpO2 94%   BMI 30.72 kg/m   Wt Readings from Last 3 Encounters:  07/05/16 208 lb (94.3 kg)  07/03/16 208 lb (94.3 kg)  05/29/16 220 lb (99.8 kg)    Physical Exam  Constitutional: He is oriented to person, place, and time. He appears well-developed and well-nourished. No distress.  HENT:  Head: Normocephalic and atraumatic.  Eyes: Conjunctivae are normal.  Cardiovascular: Normal rate.   Pulmonary/Chest: Effort normal.  Abdominal:  Drain in place- small amount of dark fluid in drain  Musculoskeletal: Normal range of motion. He exhibits edema.  Neurological: He is alert and oriented to person, place, and time.  Skin: He is not diaphoretic.  Nursing note and vitals reviewed.         Assessment & Plan:   Common bile duct repair with T-Tube 11/192017  Acute gangrenous cholecystitis s/p cholecystectomy 05/30/2016  Uncontrolled type 2 diabetes mellitus with nephropathy ([place]) No Follow-up on file.

## 2016-07-05 NOTE — Assessment & Plan Note (Signed)
Recheck CBC today. Drain in place- follow up with surgery tomorrow.

## 2016-07-05 NOTE — Assessment & Plan Note (Signed)
Followed by endo.  

## 2016-07-16 ENCOUNTER — Other Ambulatory Visit: Payer: Self-pay | Admitting: Family Medicine

## 2016-07-17 ENCOUNTER — Other Ambulatory Visit: Payer: Self-pay | Admitting: Surgery

## 2016-07-17 DIAGNOSIS — K810 Acute cholecystitis: Secondary | ICD-10-CM

## 2016-07-18 ENCOUNTER — Ambulatory Visit
Admission: RE | Admit: 2016-07-18 | Discharge: 2016-07-18 | Disposition: A | Discharge status: Home / Self Care | Payer: 59 | Source: Ambulatory Visit | Attending: Surgery | Admitting: Surgery

## 2016-07-18 DIAGNOSIS — K810 Acute cholecystitis: Secondary | ICD-10-CM

## 2016-07-18 MED ORDER — IOPAMIDOL (ISOVUE-300) INJECTION 61%
125.0000 mL | Freq: Once | INTRAVENOUS | Status: AC | PRN
  Administered 2016-07-18: 125 mL via INTRAVENOUS

## 2016-07-18 NOTE — Progress Notes (Signed)
Please call the patient and let them know that their CT scan was normal.  Follow-up PRN

## 2016-07-19 ENCOUNTER — Other Ambulatory Visit: Payer: Self-pay | Admitting: Internal Medicine

## 2016-07-19 ENCOUNTER — Other Ambulatory Visit: Payer: Self-pay | Admitting: Family Medicine

## 2016-07-24 ENCOUNTER — Other Ambulatory Visit: Payer: Self-pay | Admitting: Internal Medicine

## 2016-07-24 ENCOUNTER — Other Ambulatory Visit: Payer: 59

## 2016-08-14 ENCOUNTER — Other Ambulatory Visit: Payer: Self-pay | Admitting: Internal Medicine

## 2016-08-17 ENCOUNTER — Encounter (HOSPITAL_COMMUNITY): Payer: Self-pay | Admitting: *Deleted

## 2016-08-19 ENCOUNTER — Other Ambulatory Visit: Payer: Self-pay | Admitting: Internal Medicine

## 2016-08-19 ENCOUNTER — Other Ambulatory Visit: Payer: Self-pay | Admitting: Family Medicine

## 2016-09-18 ENCOUNTER — Other Ambulatory Visit: Payer: Self-pay | Admitting: Internal Medicine

## 2016-10-02 ENCOUNTER — Encounter: Payer: Self-pay | Admitting: Internal Medicine

## 2016-10-02 ENCOUNTER — Ambulatory Visit (INDEPENDENT_AMBULATORY_CARE_PROVIDER_SITE_OTHER): Payer: 59 | Admitting: Internal Medicine

## 2016-10-02 VITALS — BP 142/84 | HR 85 | Ht 69.0 in | Wt 215.0 lb

## 2016-10-02 DIAGNOSIS — E1121 Type 2 diabetes mellitus with diabetic nephropathy: Secondary | ICD-10-CM

## 2016-10-02 DIAGNOSIS — IMO0002 Reserved for concepts with insufficient information to code with codable children: Secondary | ICD-10-CM

## 2016-10-02 DIAGNOSIS — E230 Hypopituitarism: Secondary | ICD-10-CM | POA: Diagnosis not present

## 2016-10-02 DIAGNOSIS — E1165 Type 2 diabetes mellitus with hyperglycemia: Secondary | ICD-10-CM | POA: Diagnosis not present

## 2016-10-02 LAB — POCT GLYCOSYLATED HEMOGLOBIN (HGB A1C): HEMOGLOBIN A1C: 9.1

## 2016-10-02 MED ORDER — DULAGLUTIDE 0.75 MG/0.5ML ~~LOC~~ SOAJ: SUBCUTANEOUS | 1 refills | Status: DC

## 2016-10-02 MED ORDER — BASAGLAR KWIKPEN 100 UNIT/ML ~~LOC~~ SOPN: 20.0000 [IU] | PEN_INJECTOR | Freq: Every day | SUBCUTANEOUS | 2 refills | Status: DC

## 2016-10-02 NOTE — Patient Instructions (Addendum)
Please restart: - Basaglar 20 units at bedtime.  If sugars in am are still >140, please increase to 25 units. If sugars in am are still >140, please increase to 30 units.  Please start: - Please start Trulicity 0.75 mg weekly. Let me know when you are close to running out to call in the higher dose to your pharmacy (1.5 mg).  Please continue: - Metformin 1000 mg 2x daily - Farxiga 10 mg daily in am   Continue: - Testosterone injection 50 mg weekly.  Please come back for labs halfway between injections, fasting at least 8h, at 8 am.  Please return in 3 months with your sugar log.

## 2016-10-02 NOTE — Progress Notes (Signed)
Patient ID: Martin Downs, male   DOB: 1966/07/29, 51 y.o.   MRN: [id number]  HPI: Martin Downs is a 51 y.o.-year-old male, returning for f/u for DM2 dx 2014, insulin-dependent since 02/2014, uncontrolled, with complications (mild CKD). Also,  Hypogonadotropic hypogonadism. Last visit 3 mo ago.  DM2: Last hemoglobin A1c was: Lab Results  Component Value Date   HGBA1C 8.4 (H) 05/30/2016   HGBA1C 8.4 (H) 03/01/2016   HGBA1C 8.4 01/19/2016   Pt was on a regimen of: - Metformin 1000 mg bid - Levemir 40 units 2x a day  - NovoLog 15 min before a meal:  - 30 units before a smaller meal - 35 units before a larger meal    Novolog 15 units for a snack. We stopped Actoplus 15-850 mg in 02/2014. We stopped Glipizide when we increased Novolog. Insurance does not cover Invokana.  He now takes: - Metformin 1000 mg bid - Farxiga 10 mg daily in am (added 09/2015) He stopped in 07/2016 >> "to see what will happen" - Tanzeum 50 mg weekly - Basaglar 25 units at bedtime We tried to add Actos 15 mg daily (added back 06/2016) >> stopped   Pt checks sugars 2-3x a day >> Sugars higher (these are not necessarily check fasting, as he is never on an empty stomach): - am:  168-248 >> 117-240, 325 >> 138-289 >> 170-200s >> 150-250 - 2h after b'fast: 166-300, 380 >> n/c >> 114 >> n/c - lunch:  196, 218-266 >> n/c >> 115-191 >> 150-200 >> n/c - 2h after lunch:205-307 >> 210-252, 300 >> n/c  - dinner: 142, 162-230, 297, 515 >> 140-260 >> 99, 189 >> 150-200 >> 150-250 - 2h after dinner:  155-314 >> 228-260 >> 241-379 >> n/c - bedtime:155, 201-290 >> 195-253 >> 110-212 >> 200-220 >> n/c - nighttime: 190-240 >> n/c >> 180-203, 354 >> n/c >> 130-218 >> n/c  Meter: One Touch Ultra mini  Pt's meals are - grazes! - Breakfast: oatmeal or yoghurt or fruit bar/cup (largest meal) - Lunch: PB sandwich - Dinner: chicken leftovers - Snacks: sandwich, 1 pack of crackers, diet Mountain dew  - Mild CKD,  last BUN/creatinine:  Lab Results  Component Value Date   BUN 13 06/08/2016   CREATININE 0.84 06/08/2016  Not on an ACEI. - last set of lipids: Lab Results  Component Value Date   CHOL 133 03/01/2016   HDL 33.10 (L) 03/01/2016   LDLCALC 64 03/01/2016   LDLDIRECT 136.0 11/01/2014   TRIG 178.0 (H) 03/01/2016   CHOLHDL 4 03/01/2016  On Crestor and Fenofibrate. - last eye exam was on 12/2015. No DR. - no numbness and tingling in his feet.  Hypogonadotropic hypogonadism: - He was on Androgel in 2015 >> level did not increase and he did not feel better >> now on im testosterone 50 mg weekly  Reviewed levels - latest free T level normal: Component     Latest Ref Rng & Units 07/18/2015 10/18/2015 10/18/2015          7:52 AM  7:52 AM  Testosterone     250 - 827 ng/dL 409 (L) 811 (H) 914 (H)  Albumin     3.6 - 5.1 g/dL   4.5  Sex Horm Binding Glob, Serum     10 - 50 nmol/L 34 25 25  Testosterone Free     47.0 - 244.0 pg/mL 23.9 (L) 259.1 (H) 186.3  Testosterone, Bioavailable     130.5 - 681.7 ng/dL  383.1  Testosterone-% Free     1.6 - 2.9 % 1.8 2.7    PSA levels stable, normal: Lab Results  Component Value Date   PSA 0.69 03/01/2016   PSA 0.91 10/18/2015   PSA 0.37 01/17/2010   PSA 0.55 11/07/2009   Hb/HT normal recently:  Component     Latest Ref Rng & Units 07/05/2016          Hemoglobin     13.0 - 17.0 g/dL 56.3  HCT     87.5 - 64.3 % 45.3   - We checked his pituitary function in 2016 >>  Labs normal except inappropriately normal LH, FSH , and low  Testosterone.  PRL higher, but Monomeric prolactin normal.   Component     Latest Ref Rng 05/16/2015  Testosterone     300 - 890 ng/dL 329 (L)  Sex Hormone Binding     10 - 50 nmol/L 28  Testosterone Free     47.0 - 244.0 pg/mL 26.9 (L)  Testosterone-% Free     1.6 - 2.9 % 2.0  IGF-I, LC/MS     50 - 317 ng/mL 99  Z-Score (Male)     -2.0-+2.0 SD -0.7  Prolactin, Total     2.0 - 18.0 ng/mL 35.4 (H)   Prolactin, Monomeric     3.4 - 14.8 ng/mL 11.4  LH     1.50 - 9.30 mIU/mL 2.03  FSH     1.4 - 18.1 mIU/ML 5.9  Cortisol, Plasma      14.2  C206 ACTH     6 - 50 pg/mL 32   Previous labs (drawn at 8:33 AM)   Prolactin 38.6 (2.1-17.1)  FSH 5.6, LH 2.0  Total testosterone 134 (300-890), bioavailable testosterone 35.5 ng/dL (518.8-416.6), free testosterone 18 pg/mL (47-244), SHBG 29 (10-50)  PSA 0.16  Estradiol 17.1 (0-39)  He had a pituitary MRI 07/14/2015) >>  No pituitary lesions.  Hyperprolactinemia: - found during investigation for low testosterone, by Dr. Mena Goes, his urologist. -  Turned out to be caused by macroprolactin, which does not require further investigation  Pt denies fatigue, low libido and problems with erections, gynecomastia or galactorrhea.   I reviewed pt's medications, allergies, PMH, social hx, family hx, and changes were documented in the history of present illness. Otherwise, unchanged from my initial visit note.  ROS: Constitutional: no weight gain/loss,no fatigue, + subjective hyperthermia, + poor sleep Eyes: no blurry vision, no xerophthalmia ENT: no sore throat, no nodules palpated in throat, no dysphagia/odynophagia, no hoarseness Cardiovascular: no CP/SOB/palpitations/leg swelling Respiratory: no cough/SOB Gastrointestinal: no N/no V/D/C Musculoskeletal: no muscle/joint aches Skin: no rashes, + hyperhidrosis Neurological: no tremors/no numbness/tingling/dizziness  PE: BP (!) 142/84 (BP Location: Left Arm, Patient Position: Sitting)   Pulse 85   Ht 5\' 9"  (1.753 m)   Wt 215 lb (97.5 kg)   SpO2 98%   BMI 31.75 kg/m  Body mass index is 31.75 kg/m.  Wt Readings from Last 3 Encounters:  10/02/16 215 lb (97.5 kg)  07/05/16 208 lb (94.3 kg)  07/03/16 208 lb (94.3 kg)  Constitutional: overweight, in NAD Eyes: PERRLA, EOMI, no exophthalmos ENT: moist mucous membranes, no thyromegaly, no cervical lymphadenopathy Cardiovascular: RRR, No  MRG Respiratory: CTA B Gastrointestinal: abdomen soft, NT, ND, BS+ Musculoskeletal: no deformities, strength intact in all 4 Skin: moist, warm, no rashes Neurological: no tremor with outstretched hands, DTR normal in all 4  ASSESSMENT: 1. DM2, insulin-dependent, uncontrolled, with complications - mild ckd  -  We discussed in the past about a VGO system or another type of pump >> but he works outside and sweats a lot >> he does not think he can wear this  2. Hypogonadotropic hypogonadism  PLAN:  1. Complex patient with h/o uncontrolled diabetes, with increased insulin resistance.  We have been unsuccessful in managing his diabetes, due to the fact that he works 20 hours a day, sleeps very little, and eats incessantly (grazing, smaller meals). Also, since last visit, he stopped his insulin completely and also his Tanzeum as an experiment. He feels that his sugars are the same, however, his A1c today is higher: 9.1%. - We have discussed about an insulin pump in the past, but he cannot use any of the attached devices due to the fact that he has hyperkyphosis. - We will try to add back Basaglar and Trulicity for now.  - We did discuss about the possibility to refer him to another endocrinologist, [facility] at [facility], for a second opinion, but he refuses.  - I suggested to:  Patient Instructions  Please restart: - Basaglar 20 units at bedtime.  If sugars in am are still >140, please increase to 25 units. If sugars in am are still >140, please increase to 30 units.  Please start: - Please start Trulicity 0.75 mg weekly. Let me know when you are close to running out to call in the higher dose to your pharmacy (1.5 mg).  Please continue: - Metformin 1000 mg 2x daily - Farxiga 10 mg daily in am   Continue: - Testosterone injection 50 mg weekly.  Please come back for labs halfway between injections, fasting at least 8h, at 8 am.  Please return in 3 months with your sugar log.   - continue  checking sugars at different times of the day - check 3 times a day, rotating checks - advised for yearly eye exams - he is UTD - refused flu shot - Return to clinic in 3 mo with sugar log.   2. Hypogonadotropic Hypogonadism - now on Testosterone inj weekly (50 mg) - No complaints. - latest free testosterone level normal, as was his PSA and,  more recently, a Hb/HT level >> will repeat now - He will return to have the testosterone level and a PSA checked halfway between injections, fasting, at 8 AM - Pituitary MRI +  rest of the pituitary tests >> normal - per DRE from urologist >> he does have BPH.  Orders Placed This Encounter  Procedures  . Testosterone, Free, Total, SHBG  . PSA   He needs all labs to go through [facility].  - time spent with the patient: 40 minutes, of which >50% was spent reviewing his CBG logs, reviewing his previous labs, evaluations, and treatments, counseling him about his condition (please see the discussed topics above), and developing a plan to further treat his diabetes; he is a complex patient, with very difficult to control diabetes.   Carlus Pavlovristina Sondi Desch, MD PhD [facility]

## 2016-10-02 NOTE — Addendum Note (Signed)
Addended by: Darene LamerHOMPSON, Omar Orrego T on: 10/02/2016 03:14 PM   Modules accepted: Orders

## 2016-10-29 ENCOUNTER — Telehealth: Payer: Self-pay | Admitting: Internal Medicine

## 2016-10-29 ENCOUNTER — Other Ambulatory Visit: Payer: Self-pay

## 2016-10-29 MED ORDER — DULAGLUTIDE 1.5 MG/0.5ML ~~LOC~~ SOAJ: SUBCUTANEOUS | 2 refills | Status: DC

## 2016-10-29 NOTE — Telephone Encounter (Signed)
Pt is ready for the trulicity increase to be called [facility] [place]

## 2016-10-29 NOTE — Telephone Encounter (Signed)
Submitted increase dose, according to last note was okay to increase.

## 2016-11-26 ENCOUNTER — Other Ambulatory Visit: Payer: Self-pay | Admitting: Internal Medicine

## 2016-12-19 ENCOUNTER — Other Ambulatory Visit: Payer: Self-pay | Admitting: Internal Medicine

## 2017-01-08 ENCOUNTER — Ambulatory Visit: Payer: 59 | Admitting: Internal Medicine

## 2017-01-09 DIAGNOSIS — E119 Type 2 diabetes mellitus without complications: Secondary | ICD-10-CM | POA: Diagnosis not present

## 2017-01-09 LAB — HM DIABETES EYE EXAM

## 2017-01-14 ENCOUNTER — Other Ambulatory Visit: Payer: Self-pay | Admitting: Internal Medicine

## 2017-01-22 ENCOUNTER — Other Ambulatory Visit: Payer: Self-pay | Admitting: Internal Medicine

## 2017-02-28 ENCOUNTER — Telehealth: Payer: Self-pay | Admitting: Internal Medicine

## 2017-02-28 NOTE — Telephone Encounter (Signed)
**  Remind patient they can make refill requests via MyChart**  Medication refill request (Name & Dosage):  Patient needs pen needles to draw testosterone from vial AND to inject testosterone. (two separate scripts for two diff kinds of needles)  Preferred pharmacy (Name & Address):  [id number] - [address]   Other comments (if applicable): Needles to inject testosterone need to be 24 or 26 gauge. He can not use large needles to inject himself. 18g is too big he says.

## 2017-03-01 NOTE — Telephone Encounter (Signed)
Routing to you °

## 2017-03-01 NOTE — Telephone Encounter (Signed)
Is this okay to call in?  Please advise, the consider Testostorone to be a controlled substance, so wanted to check to make sure he is okay to inject himself? Thanks!

## 2017-03-04 ENCOUNTER — Other Ambulatory Visit: Payer: Self-pay

## 2017-03-04 MED ORDER — "INSULIN SYRINGE-NEEDLE U-100 25G X 1"" 1 ML MISC": 5 refills | Status: DC

## 2017-03-04 MED ORDER — "INSULIN SYRINGE-NEEDLE U-100 31G X 5/16"" 0.5 ML MISC": 5 refills | Status: DC

## 2017-03-04 MED ORDER — "SYRINGE/NEEDLE (DISP) 18G X 1"" 1 ML MISC": 5 refills | Status: AC

## 2017-03-04 MED ORDER — TESTOSTERONE CYPIONATE 200 MG/ML IM SOLN: INTRAMUSCULAR | 3 refills | Status: DC

## 2017-03-04 NOTE — Telephone Encounter (Signed)
OK 

## 2017-03-04 NOTE — Telephone Encounter (Signed)
Submitted

## 2017-03-07 ENCOUNTER — Other Ambulatory Visit: Payer: Self-pay | Admitting: Internal Medicine

## 2017-04-13 ENCOUNTER — Other Ambulatory Visit: Payer: Self-pay | Admitting: Family Medicine

## 2017-04-13 ENCOUNTER — Other Ambulatory Visit: Payer: Self-pay | Admitting: Internal Medicine

## 2017-04-15 ENCOUNTER — Ambulatory Visit (INDEPENDENT_AMBULATORY_CARE_PROVIDER_SITE_OTHER): Payer: 59 | Admitting: Internal Medicine

## 2017-04-15 ENCOUNTER — Encounter: Payer: Self-pay | Admitting: Internal Medicine

## 2017-04-15 VITALS — BP 134/74 | HR 88 | Ht 69.0 in | Wt 210.0 lb

## 2017-04-15 DIAGNOSIS — E1165 Type 2 diabetes mellitus with hyperglycemia: Secondary | ICD-10-CM

## 2017-04-15 DIAGNOSIS — E230 Hypopituitarism: Secondary | ICD-10-CM | POA: Diagnosis not present

## 2017-04-15 DIAGNOSIS — IMO0002 Reserved for concepts with insufficient information to code with codable children: Secondary | ICD-10-CM

## 2017-04-15 DIAGNOSIS — E1121 Type 2 diabetes mellitus with diabetic nephropathy: Secondary | ICD-10-CM

## 2017-04-15 LAB — POCT GLYCOSYLATED HEMOGLOBIN (HGB A1C): Hemoglobin A1C: 8.1

## 2017-04-15 MED ORDER — BASAGLAR KWIKPEN 100 UNIT/ML ~~LOC~~ SOPN: 40.0000 [IU] | PEN_INJECTOR | Freq: Every day | SUBCUTANEOUS | 2 refills | Status: DC

## 2017-04-15 NOTE — Patient Instructions (Addendum)
Please continue: - Basaglar 40 units at bedtime.  - Trulicity 1.5 mg weekly - Metformin 1000 mg 2x daily - Farxiga 10 mg daily in am   Continue: - Testosterone injection 50 mg weekly.  Please come back for labs halfway between injections, fasting at least 8h, at 8 am.  Please return in 4 months with your sugar log.

## 2017-04-15 NOTE — Progress Notes (Addendum)
Patient ID: Martin Downs, male   DOB: [birth year], 51 y.o.   MRN: [id number]  HPI: Martin Downs is a 51 y.o.-year-old male, returning for f/u for DM2 dx 2014, insulin-dependent since 02/2014, uncontrolled, with complications (mild CKD). Also,  Hypogonadotropic hypogonadism. Last visit 6 mo ago.  DM2: Last hemoglobin A1c was: Lab Results  Component Value Date   HGBA1C 9.1 10/02/2016   HGBA1C 8.4 (H) 05/30/2016   HGBA1C 8.4 (H) 03/01/2016   Pt was on a regimen of: - Metformin 1000 mg bid - Levemir 40 units 2x a day  - NovoLog 15 min before a meal:  - 30 units before a smaller meal - 35 units before a larger meal    Novolog 15 units for a snack. We stopped Actoplus 15-850 mg in 02/2014. We stopped Glipizide when we increased Novolog. Insurance does not cover Invokana. We tried to add Actos 15 mg daily (added back 06/2016) >> stopped   He now takes: - Basaglar 40 units at bedtime - restarted 09/2016 - Trulicity 1.5 mg weekly - restarted 09/2016 - Metformin 1000 mg 2x daily - Farxiga 10 mg daily in am   Pt checks sugars 2-3x a day >> Sugars (not fasting, as he is never on an empty stomach): - am:  168-248 >> 117-240, 325 >> 138-289 >> 170-200s >> 150-250 >> 200-250 - 2h after b'fast: 166-300, 380 >> n/c >> 114 >> n/c - lunch:  196, 218-266 >> n/c >> 115-191 >> 150-200 >> n/c - 2h after lunch:205-307 >> 210-252, 300 >> n/c  - dinner: 142, 162-230, 297, 515 >> 140-260 >> 99, 189 >> 150-200 >> 150-250 >> 200-250 - 2h after dinner:  155-314 >> 228-260 >> 241-379 >> n/c - bedtime:155, 201-290 >> 195-253 >> 110-212 >> 200-220 >> n/c - nighttime: 190-240 >> n/c >> 180-203, 354 >> n/c >> 130-218 >> n/c  Meter: One Touch Ultra mini  Pt's meals are - he grazes throughout the day: - Breakfast: oatmeal or yoghurt or fruit bar/cup (largest meal) - Lunch: PB sandwich - Dinner: chicken leftovers - Snacks: sandwich, 1 pack of crackers, diet Mountain dew  - + mild CKD, last  BUN/creatinine:  Lab Results  Component Value Date   BUN 13 06/08/2016   CREATININE 0.84 06/08/2016  Not on an ACEI. - last set of lipids: Lab Results  Component Value Date   CHOL 133 03/01/2016   HDL 33.10 (L) 03/01/2016   LDLCALC 64 03/01/2016   LDLDIRECT 136.0 11/01/2014   TRIG 178.0 (H) 03/01/2016   CHOLHDL 4 03/01/2016  On Crestor and Fenofibrate. - last eye exam was on 12/2016.  No  >> No DR - denies numbness and tingling in his feet.  Hypogonadotropic hypogonadism: - He was on Androgel in 2015 >> level did not increase and he did not feel better >> now on im testosterone 50 mg weekly  Reviewed levels - latest free T level normal: Component     Latest Ref Rng & Units 01/16/2016  Testosterone     250 - 827 ng/dL 0,865 (H)  Albumin     3.6 - 5.1 g/dL 4.4  Sex Horm Binding Glob, Serum     10 - 50 nmol/L 28  Testosterone Free     47.0 - 244.0 pg/mL 185.8  Testosterone, Bioavailable     130.5 - 681.7 ng/dL 784.6   Component     Latest Ref Rng & Units 07/18/2015 10/18/2015 10/18/2015  7:52 AM  7:52 AM  Testosterone     250 - 827 ng/dL 161 (L) 096 (H) 045 (H)  Albumin     3.6 - 5.1 g/dL   4.5  Sex Horm Binding Glob, Serum     10 - 50 nmol/L 34 25 25  Testosterone Free     47.0 - 244.0 pg/mL 23.9 (L) 259.1 (H) 186.3  Testosterone, Bioavailable     130.5 - 681.7 ng/dL   409.8  Testosterone-% Free     1.6 - 2.9 % 1.8 2.7    PSA levels - stable, normal: Lab Results  Component Value Date   PSA 0.69 03/01/2016   PSA 0.91 10/18/2015   PSA 0.37 01/17/2010   PSA 0.55 11/07/2009   Hb/HT normal at last check:: Component     Latest Ref Rng & Units 07/05/2016  Hemoglobin     13.0 - 17.0 g/dL 11.9  HCT     14.7 - 82.9 % 45.3   - We checked his pituitary function in 2016 >>  Labs normal except inappropriately normal LH, FSH , and low  Testosterone.  PRL higher, but Monomeric prolactin normal.   Component     Latest Ref Rng 05/16/2015  Testosterone     300  - 890 ng/dL 562 (L)  Sex Hormone Binding     10 - 50 nmol/L 28  Testosterone Free     47.0 - 244.0 pg/mL 26.9 (L)  Testosterone-% Free     1.6 - 2.9 % 2.0  IGF-I, LC/MS     50 - 317 ng/mL 99  Z-Score (Male)     -2.0-+2.0 SD -0.7  Prolactin, Total     2.0 - 18.0 ng/mL 35.4 (H)  Prolactin, Monomeric     3.4 - 14.8 ng/mL 11.4  LH     1.50 - 9.30 mIU/mL 2.03  FSH     1.4 - 18.1 mIU/ML 5.9  Cortisol, Plasma      14.2  C206 ACTH     6 - 50 pg/mL 32   Previous labs (drawn at 8:33 AM)   Prolactin 38.6 (2.1-17.1)  FSH 5.6, LH 2.0  Total testosterone 134 (300-890), bioavailable testosterone 35.5 ng/dL (130.8-657.8), free testosterone 18 pg/mL (47-244), SHBG 29 (10-50)  PSA 0.16  Estradiol 17.1 (0-39)  He had a pituitary MRI 07/14/2015) >>  No pituitary lesions.  Hyperprolactinemia: - found during investigation for low testosterone, by Dr. Mena Goes, his urologist. -  Turned out to be caused by macroprolactin, which does not require further investigation  Patient denies gynecomastia or galactorrhea or breast tenderness. He still has low libido and fatigue.  ROS: Constitutional: no weight gain/no weight loss, + fatigue, + subjective hyperthermia Eyes: no blurry vision, no xerophthalmia ENT: no sore throat, no nodules palpated in throat, no dysphagia, no odynophagia, no hoarseness Cardiovascular: no CP/no SOB/no palpitations/no leg swelling Respiratory: no cough/no SOB/no wheezing Gastrointestinal: no N/no V/no D/+ C/no acid reflux Musculoskeletal: no muscle aches/no joint aches Skin: no rashes, no hair loss, + ear hypertrichosis Neurological: no tremors/no numbness/no tingling/no dizziness  I reviewed pt's medications, allergies, PMH, social hx, family hx, and changes were documented in the history of present illness. Otherwise, unchanged from my initial visit note.  PE: BP 134/74 (BP Location: Left Arm, Patient Position: Sitting)   Pulse 88   Ht  (1.753 m)   Wt  210 lb (95.3 kg)   SpO2 96%   BMI 31.01 kg/m  Body mass index is 31.01 kg/m.  Wt Readings from Last 3 Encounters:  04/15/17 210 lb (95.3 kg)  10/02/16 215 lb (97.5 kg)  07/05/16 208 lb (94.3 kg)   Constitutional: overweight, in NAD Eyes: PERRLA, EOMI, no exophthalmos ENT: moist mucous membranes, no thyromegaly, no cervical lymphadenopathy Cardiovascular: RRR, No MRG Respiratory: CTA B Gastrointestinal: abdomen soft, NT, ND, BS+ Musculoskeletal: no deformities, strength intact in all 4 Skin: moist, warm, no rashes Neurological: no tremor with outstretched hands, DTR normal in all 4  ASSESSMENT: 1. DM2, insulin-dependent, uncontrolled, with complications - mild ckd  - We discussed in the past about a VGO system or another type of pump >> but he works outside and sweats a lot >> he does not think he can wear this - We have discussed about a regular insulin pump in the past, but he cannot use any of the attached devices due to the fact that he has hyperhidrosis. - We did discuss about the possibility to refer him to another endocrinologist, possibly at [facility], for a second opinion, but he refuses.   2. Hypogonadotropic hypogonadism  PLAN:  1. Complex patient wuncontrolled diabetes, with increased insulin resistance, and CBGs not significantly affected by changes in his medication regimen, as he works a lot (20 hours a day), sleeps very little and eats incessantly (he is grazing, having smaller meals). - Per sugar checks at home (no log, and meter is not working), sugars at the same as before, however, HbA1c is 8.1% (improved) at today's check. His meter gives him errors so we gave him another meter today. Since sugars appear improved, I advised him to continue the current regimen for now and will recheck at next visit. Next step in escalating his therapy would be adding mealtime insulin.  - I suggested to:  Patient Instructions  Please continue: - Basaglar 40 units at bedtime.  -  Trulicity 1.5 mg weekly - Metformin 1000 mg 2x daily - Farxiga 10 mg daily in am   Continue: - Testosterone injection 50 mg weekly.  Please come back for labs halfway between injections, fasting at least 8h, at 8 am.  Please return in 4 months with your sugar log.   - continue checking sugars at different times of the day - check 2x a day, rotating checks - advised for yearly eye exams >> he is UTD - will check a CMP and lipid panel at next blood draw  - Return to clinic in 3 mo with sugar log   2. Hypogonadotropic Hypogonadism - Continues on testosterone IM injections weekly (50 mg) - Reviewed the latest free testosterone level which was normal 1 year ago. We will need to repeat it. - He continues to have low libido but no breast tension or galactorrhea. - Reviewed latest PSA level which was normal and stable and the hemoglobin/hematocrit level were normal. - Will recheck the testosterone levels along with the above labs fasting, halfway between injections  - Pituitary MRI +  rest of the pituitary tests >> normal - he sees a urologist and has annual DREs >> he does have prostate hypertrophy   Orders Placed This Encounter  Procedures  . Testosterone, Free, Total, SHBG  . PSA  . CBC  . COMPLETE METABOLIC PANEL WITH GFR  . Lipid panel  . POCT HgB A1C   I will addend the results when they become available.  He needs all labs to go through [facility].  Addendum: Component     Latest Ref Rng & Units 04/30/2017  Glucose  65 - 99 mg/dL 161 (H)  BUN     7 - 25 mg/dL 11  Creatinine     0.96 - 1.33 mg/dL 0.45  GFR, Est Non African American     > OR = 60 mL/min/1.54m2 78  GFR, Est African American     > OR = 60 mL/min/1.104m2 90  BUN/Creatinine Ratio     6 - 22 (calc) NOT APPLICABLE  Sodium     135 - 146 mmol/L 138  Potassium     3.5 - 5.3 mmol/L 4.2  Chloride     98 - 110 mmol/L 102  CO2     20 - 32 mmol/L 27  Calcium     8.6 - 10.3 mg/dL 9.3  Total Protein     6.1  - 8.1 g/dL 6.4  Albumin MSPROF     3.6 - 5.1 g/dL 4.4  Globulin     1.9 - 3.7 g/dL (calc) 2.0  AG Ratio     1.0 - 2.5 (calc) 2.2  Total Bilirubin     0.2 - 1.2 mg/dL 1.0  Alkaline phosphatase (APISO)     40 - 115 U/L 25 (L)  AST     10 - 35 U/L 23  ALT     9 - 46 U/L 31  WBC     3.4 - 10.8 x10E3/uL 6.7  RBC     4.14 - 5.80 x10E6/uL 5.05  Hemoglobin     13.0 - 17.7 g/dL 40.9  HCT     81.1 - 91.4 % 47.1  MCV     79 - 97 fL 93  MCH     26.6 - 33.0 pg 30.9  MCHC     31.5 - 35.7 g/dL 78.2  RDW     95.6 - 21.3 % 13.1  Platelets     150 - 379 x10E3/uL 203  Cholesterol, Total     100 - 199 mg/dL 086  Triglycerides     0 - 149 mg/dL 578 (H)  HDL Cholesterol     >39 mg/dL 32 (L)  VLDL Cholesterol Cal     5 - 40 mg/dL 41 (H)  LDL (calc)     0 - 99 mg/dL 31  Total CHOL/HDL Ratio     0.0 - 5.0 ratio 3.3  Testosterone     264 - 916 ng/dL 4,696 (H)  Testosterone Free     7.2 - 24.0 pg/mL 28.6 (H)  Sex Horm Binding Glob, Serum     19.3 - 76.4 nmol/L 33.0  Prostate Specific Ag, Serum     0.0 - 4.0 ng/mL 0.7   PSA normal. Hb/HT normal. Testosterone slightly high. Will need to recheck at next visit. Glu high. A phos low. Will advise to start a MVI (Mg or Zn def?) LDL at goal, high TG (chronic).  Carlus Pavlov, MD PhD [facility]

## 2017-04-16 ENCOUNTER — Other Ambulatory Visit: Payer: Self-pay | Admitting: Family Medicine

## 2017-04-16 ENCOUNTER — Other Ambulatory Visit: Payer: Self-pay | Admitting: Surgery

## 2017-04-16 ENCOUNTER — Ambulatory Visit
Admission: RE | Admit: 2017-04-16 | Discharge: 2017-04-16 | Disposition: A | Discharge status: Home / Self Care | Payer: 59 | Source: Ambulatory Visit | Attending: Surgery | Admitting: Surgery

## 2017-04-16 DIAGNOSIS — R1033 Periumbilical pain: Secondary | ICD-10-CM

## 2017-04-16 DIAGNOSIS — Z8719 Personal history of other diseases of the digestive system: Secondary | ICD-10-CM | POA: Diagnosis not present

## 2017-04-16 DIAGNOSIS — N200 Calculus of kidney: Secondary | ICD-10-CM | POA: Diagnosis not present

## 2017-04-16 MED ORDER — IOPAMIDOL (ISOVUE-300) INJECTION 61%
125.0000 mL | Freq: Once | INTRAVENOUS | Status: AC | PRN
  Administered 2017-04-16: 125 mL via INTRAVENOUS

## 2017-04-23 ENCOUNTER — Telehealth: Payer: Self-pay

## 2017-04-23 DIAGNOSIS — R1033 Periumbilical pain: Secondary | ICD-10-CM | POA: Diagnosis not present

## 2017-04-23 MED ORDER — FLUTICASONE PROPIONATE 50 MCG/ACT NA SUSP: NASAL | 0 refills | Status: DC

## 2017-04-23 NOTE — Telephone Encounter (Signed)
Pt left v/m about qty for fluticasone and ask call [facility] and redo qty. I spoke with Trinna Post at [employer]. Usually 16 g instead of 160 g. Advised to change to 16 g. Alex voiced understanding.

## 2017-04-30 ENCOUNTER — Other Ambulatory Visit: Payer: 59

## 2017-04-30 DIAGNOSIS — E1121 Type 2 diabetes mellitus with diabetic nephropathy: Secondary | ICD-10-CM | POA: Diagnosis not present

## 2017-04-30 DIAGNOSIS — E1165 Type 2 diabetes mellitus with hyperglycemia: Secondary | ICD-10-CM | POA: Diagnosis not present

## 2017-04-30 DIAGNOSIS — E230 Hypopituitarism: Secondary | ICD-10-CM | POA: Diagnosis not present

## 2017-04-30 DIAGNOSIS — IMO0002 Reserved for concepts with insufficient information to code with codable children: Secondary | ICD-10-CM

## 2017-05-01 LAB — TESTOSTERONE, FREE, TOTAL, SHBG
Sex Hormone Binding: 33 nmol/L (ref 19.3–76.4)
Testosterone, Free: 28.6 pg/mL — ABNORMAL HIGH (ref 7.2–24.0)
Testosterone: 1108 ng/dL — ABNORMAL HIGH (ref 264–916)

## 2017-05-01 LAB — PSA: Prostate Specific Ag, Serum: 0.7 ng/mL (ref 0.0–4.0)

## 2017-05-01 LAB — COMPLETE METABOLIC PANEL WITH GFR
AG RATIO: 2.2 (calc) (ref 1.0–2.5)
ALBUMIN MSPROF: 4.4 g/dL (ref 3.6–5.1)
ALKALINE PHOSPHATASE (APISO): 25 U/L — AB (ref 40–115)
ALT: 31 U/L (ref 9–46)
AST: 23 U/L (ref 10–35)
BILIRUBIN TOTAL: 1 mg/dL (ref 0.2–1.2)
BUN: 11 mg/dL (ref 7–25)
CHLORIDE: 102 mmol/L (ref 98–110)
CO2: 27 mmol/L (ref 20–32)
Calcium: 9.3 mg/dL (ref 8.6–10.3)
Creat: 1.09 mg/dL (ref 0.70–1.33)
GFR, Est African American: 90 mL/min/{1.73_m2} (ref >=60)
GFR, Est Non African American: 78 mL/min/{1.73_m2} (ref >=60)
GLUCOSE: 224 mg/dL — AB (ref 65–99)
Globulin: 2 g/dL (calc) (ref 1.9–3.7)
POTASSIUM: 4.2 mmol/L (ref 3.5–5.3)
Sodium: 138 mmol/L (ref 135–146)
Total Protein: 6.4 g/dL (ref 6.1–8.1)

## 2017-05-01 LAB — CBC
HEMATOCRIT: 47.1 % (ref 37.5–51.0)
Hemoglobin: 15.6 g/dL (ref 13.0–17.7)
MCH: 30.9 pg (ref 26.6–33.0)
MCHC: 33.1 g/dL (ref 31.5–35.7)
MCV: 93 fL (ref 79–97)
PLATELETS: 203 10*3/uL (ref 150–379)
RBC: 5.05 x10E6/uL (ref 4.14–5.80)
RDW: 13.1 % (ref 12.3–15.4)
WBC: 6.7 10*3/uL (ref 3.4–10.8)

## 2017-05-01 LAB — LIPID PANEL
CHOL/HDL RATIO: 3.3 ratio (ref 0.0–5.0)
Cholesterol, Total: 104 mg/dL (ref 100–199)
HDL: 32 mg/dL — AB (ref >39)
LDL Calculated: 31 mg/dL (ref 0–99)
Triglycerides: 204 mg/dL — ABNORMAL HIGH (ref 0–149)
VLDL CHOLESTEROL CAL: 41 mg/dL — AB (ref 5–40)

## 2017-05-16 ENCOUNTER — Other Ambulatory Visit: Payer: Self-pay | Admitting: Family Medicine

## 2017-05-17 ENCOUNTER — Other Ambulatory Visit: Payer: Self-pay

## 2017-05-17 MED ORDER — "INSULIN SYRINGE-NEEDLE U-100 25G X 1"" 1 ML MISC": 5 refills | Status: DC

## 2017-05-22 ENCOUNTER — Other Ambulatory Visit: Payer: Self-pay

## 2017-05-22 MED ORDER — "SYRINGE 18G X 1-1/2"" 3 ML MISC": 0 refills | Status: DC

## 2017-06-03 DIAGNOSIS — E291 Testicular hypofunction: Secondary | ICD-10-CM | POA: Diagnosis not present

## 2017-06-15 ENCOUNTER — Other Ambulatory Visit: Payer: Self-pay | Admitting: Internal Medicine

## 2017-06-18 DIAGNOSIS — Q828 Other specified congenital malformations of skin: Secondary | ICD-10-CM | POA: Diagnosis not present

## 2017-06-18 DIAGNOSIS — L570 Actinic keratosis: Secondary | ICD-10-CM | POA: Diagnosis not present

## 2017-07-12 DIAGNOSIS — Z8719 Personal history of other diseases of the digestive system: Secondary | ICD-10-CM | POA: Diagnosis not present

## 2017-07-15 ENCOUNTER — Other Ambulatory Visit: Payer: Self-pay | Admitting: Family Medicine

## 2017-07-16 ENCOUNTER — Other Ambulatory Visit: Payer: Self-pay | Admitting: Internal Medicine

## 2017-08-12 ENCOUNTER — Other Ambulatory Visit: Payer: Self-pay | Admitting: Internal Medicine

## 2017-08-14 ENCOUNTER — Other Ambulatory Visit: Payer: Self-pay | Admitting: Internal Medicine

## 2017-08-14 ENCOUNTER — Telehealth: Payer: Self-pay

## 2017-08-14 NOTE — Telephone Encounter (Signed)
Received PA request for Marcelline DeistFarxiga, but it states Invokana and [place] PepperJardiance is covered.  Please advise

## 2017-08-15 MED ORDER — EMPAGLIFLOZIN 25 MG PO TABS: 25.0000 mg | ORAL_TABLET | Freq: Every day | ORAL | 2 refills | Status: DC

## 2017-08-15 NOTE — Telephone Encounter (Signed)
Pt is aware and medication is sent 

## 2017-08-15 NOTE — Telephone Encounter (Signed)
Let's use Jardiance 25

## 2017-08-20 ENCOUNTER — Ambulatory Visit (INDEPENDENT_AMBULATORY_CARE_PROVIDER_SITE_OTHER): Payer: 59 | Admitting: Internal Medicine

## 2017-08-20 ENCOUNTER — Encounter: Payer: Self-pay | Admitting: Internal Medicine

## 2017-08-20 VITALS — BP 144/82 | HR 89 | Temp 98.6°F | Ht 69.0 in | Wt 214.6 lb

## 2017-08-20 DIAGNOSIS — E1165 Type 2 diabetes mellitus with hyperglycemia: Secondary | ICD-10-CM | POA: Diagnosis not present

## 2017-08-20 DIAGNOSIS — E1121 Type 2 diabetes mellitus with diabetic nephropathy: Secondary | ICD-10-CM | POA: Diagnosis not present

## 2017-08-20 DIAGNOSIS — E230 Hypopituitarism: Secondary | ICD-10-CM

## 2017-08-20 DIAGNOSIS — IMO0002 Reserved for concepts with insufficient information to code with codable children: Secondary | ICD-10-CM

## 2017-08-20 MED ORDER — SEMAGLUTIDE (1 MG/DOSE) 2 MG/1.5ML ~~LOC~~ SOPN
1.0000 mg | PEN_INJECTOR | SUBCUTANEOUS | 2 refills | Status: DC
Start: 2017-08-20 — End: 2017-11-22

## 2017-08-20 NOTE — Progress Notes (Signed)
Patient ID: Martin Downs, male   DOB: Dec 05, 1965, 52 y.o.   MRN: [id number]  HPI: Martin Downs is a 52 y.o.-year-old male, returning for f/u for DM2 dx 2014, insulin-dependent since 02/2014, uncontrolled, with complications (mild CKD). Also,  Hypogonadotropic hypogonadism. Last visit 4 months ago.  DM2: Last hemoglobin A1c was: Lab Results  Component Value Date   HGBA1C 8.1 04/15/2017   HGBA1C 9.1 10/02/2016   HGBA1C 8.4 (H) 05/30/2016   He now takes: - Basaglar 40 units at bedtime - restarted 09/2016 - Trulicity 1.5 mg weekly - restarted 09/2016 - Metformin 1000 mg 2x daily - Farxiga 10 mg daily in am >>  Jardiance 25 mg daily We stopped Actoplus 15-850 mg in 02/2014. We stopped Glipizide when we increased Novolog. Insurance does not cover Invokana. We tried to add Actos 15 mg daily (added back 06/2016) >> stopped   Pt checks sugars 2-3 times a day (non-fasting, as he is almost never on an empty stomach: - am:  170-200s >> 150-250 >> 200-250 >> 212-243 - 2h after b'fast: 166-300, 380 >> n/c >> 114 >> n/c >>  239, 247 - lunch: 115-191 >> 150-200 >> n/c >> 215 - 2h after lunch: 205-307 >> 210-252, 300 >> n/c >> 249, 285 - dinner: 150-200 >> 150-250 >> 200-250 >> 211, 231 - 2h after dinner:  228-260 >> 241-379 >> n/c >> 219, 253 - bedtime: 110-212 >> 200-220 >> n/c >> 240-284 - nighttime:180-203, 354 >> n/c >> 130-218 >> n/c >> 239  Meter: One Touch Ultra mini  Pt's meals are - he grazes throughout the day: - Breakfast: oatmeal or yoghurt or fruit bar/cup (largest meal) - Lunch: PB sandwich - Dinner: chicken leftovers - Snacks: sandwich, 1 pack of crackers, diet Mountain dew  -+ Mild CKD, last BUN/creatinine:  Lab Results  Component Value Date   BUN 11 04/30/2017   CREATININE 1.09 04/30/2017  Not on ACE inhibitor. -+ HL; last set of lipids: Lab Results  Component Value Date   CHOL 104 04/30/2017   HDL 32 (L) 04/30/2017   LDLCALC 31 04/30/2017   LDLDIRECT  136.0 11/01/2014   TRIG 204 (H) 04/30/2017   CHOLHDL 3.3 04/30/2017  On Crestor and fenofibrate. - last eye exam was on 12/2016: No DR -Denies numbness and tingling in his feet.  Hypogonadotropic hypogonadism: - He was on Androgel in 2015 >> level did not increase and he did not feel better >> now on intramuscular testosterone 50 mg weekly.  Reviewed levels - latest free T level was slightly high,  But we did not change the dose at that time pending evaluation today: Component     Latest Ref Rng & Units 04/30/2017  Testosterone     264 - 916 ng/dL 4,098 (H)  Testosterone Free     7.2 - 24.0 pg/mL 28.6 (H)  Sex Horm Binding Glob, Serum     19.3 - 76.4 nmol/L 33.0   Previously: Component     Latest Ref Rng & Units 01/16/2016  Testosterone     250 - 827 ng/dL 1,191 (H)  Albumin     3.6 - 5.1 g/dL 4.4  Sex Horm Binding Glob, Serum     10 - 50 nmol/L 28  Testosterone Free     47.0 - 244.0 pg/mL 185.8  Testosterone, Bioavailable     130.5 - 681.7 ng/dL 478.2   Component     Latest Ref Rng & Units 07/18/2015 10/18/2015 10/18/2015  7:52 AM  7:52 AM  Testosterone     250 - 827 ng/dL 629131 (L) 528949 (H) 413949 (H)  Albumin     3.6 - 5.1 g/dL   4.5  Sex Horm Binding Glob, Serum     10 - 50 nmol/L 34 25 25  Testosterone Free     47.0 - 244.0 pg/mL 23.9 (L) 259.1 (H) 186.3  Testosterone, Bioavailable     130.5 - 681.7 ng/dL   244.0383.1  Testosterone-% Free     1.6 - 2.9 % 1.8 2.7    PSA levels -stable, normal: Component     Latest Ref Rng & Units 04/30/2017  Prostate Specific Ag, Serum     0.0 - 4.0 ng/mL 0.7   Lab Results  Component Value Date   PSA 0.69 03/01/2016   PSA 0.91 10/18/2015   PSA 0.37 01/17/2010   PSA 0.55 11/07/2009   Hb/HT normal at last check: Lab Results  Component Value Date   WBC 6.7 04/30/2017   HGB 15.6 04/30/2017   HCT 47.1 04/30/2017   MCV 93 04/30/2017   PLT 203 04/30/2017   - We checked his pituitary function in 2016 >>  Labs normal except  inappropriately normal LH, FSH , and low  Testosterone.  PRL higher, but Monomeric prolactin normal: Component     Latest Ref Rng 05/16/2015  Testosterone     300 - 890 ng/dL 102132 (L)  Sex Hormone Binding     10 - 50 nmol/L 28  Testosterone Free     47.0 - 244.0 pg/mL 26.9 (L)  Testosterone-% Free     1.6 - 2.9 % 2.0  IGF-I, LC/MS     50 - 317 ng/mL 99  Z-Score (Male)     -2.0-+2.0 SD -0.7  Prolactin, Total     2.0 - 18.0 ng/mL 35.4 (H)  Prolactin, Monomeric     3.4 - 14.8 ng/mL 11.4  LH     1.50 - 9.30 mIU/mL 2.03  FSH     1.4 - 18.1 mIU/ML 5.9  Cortisol, Plasma      14.2  C206 ACTH     6 - 50 pg/mL 32   Previous labs (drawn at 8:33 AM)   Prolactin 38.6 (2.1-17.1)  FSH 5.6, LH 2.0  Total testosterone 134 (300-890), bioavailable testosterone 35.5 ng/dL (725.3-664.4130.5-681.7), free testosterone 18 pg/mL (47-244), SHBG 29 (10-50)  PSA 0.16  Estradiol 17.1 (0-39)  Reviewed the report of his pituitary MRI 07/14/2015 >> no pituitary lesions  Hyperprolactinemia: - found during investigation for low testosterone, by Dr. Mena GoesEskridge, his urologist. -  Turned out to be caused by micro prolactin, which does not require further investigation.  He denies gynecomastia, galactorrhea, or breast tenderness.  Patient's alkaline phosphatase was low in 04/2017 (25) so we started a multivitamin.  ROS: Constitutional: no weight gain/no weight loss, no fatigue, no subjective hyperthermia, no subjective hypothermia Eyes: no blurry vision, no xerophthalmia ENT: no sore throat, no nodules palpated in throat, no dysphagia, no odynophagia, no hoarseness Cardiovascular: no CP/no SOB/no palpitations/no leg swelling Respiratory: no cough/no SOB/no wheezing Gastrointestinal: no N/no V/no D/no C/no acid reflux Musculoskeletal: no muscle aches/no joint aches Skin: no rashes, no hair loss Neurological: no tremors/no numbness/no tingling/no dizziness  I reviewed pt's medications, allergies, PMH, social  hx, family hx, and changes were documented in the history of present illness. Otherwise, unchanged from my initial visit note.  PE: BP (!) 144/82   Pulse 89   Temp 98.6 F (37  C) (Oral)   Ht 5\' 9"  (1.753 m)   Wt 214 lb 9.6 oz (97.3 kg)   SpO2 96%   BMI 31.69 kg/m  Body mass index is 31.69 kg/m.  Wt Readings from Last 3 Encounters:  08/20/17 214 lb 9.6 oz (97.3 kg)  04/15/17 210 lb (95.3 kg)  10/02/16 215 lb (97.5 kg)   Constitutional: overweight, in NAD Eyes: PERRLA, EOMI, no exophthalmos ENT: moist mucous membranes, no thyromegaly, no cervical lymphadenopathy Cardiovascular: RRR, No MRG Respiratory: CTA B Gastrointestinal: abdomen soft, NT, ND, BS+ Musculoskeletal: no deformities, strength intact in all 4 Skin: moist, warm, no rashes Neurological: no tremor with outstretched hands, DTR normal in all 4  ASSESSMENT: 1. DM2, insulin-dependent, uncontrolled, with complications - mild ckd  - We discussed in the past about a VGO system or another type of pump >> but he works outside and sweats a lot >> he does not think he can wear this - We have discussed about a regular insulin pump in the past, but he cannot use any of the attached devices due to the fact that he has hyperhidrosis. - We did discuss about the possibility to refer him to another endocrinologist, possibly at [facility], for a second opinion, but he refuses.   2. Hypogonadotropic hypogonadism  PLAN:  1. Complex patient with uncontrolled diabetes, with increased insulin resistance in CBGs not significantly affected by any changes that we tried in the past.  He works a lot (20 hours a day reportedly), sleeps very little and eats incessantly (he is grazing, having smaller meals).  He was off insulin but we added back the Basaglar in the last few months.  After this, his HbA1c improved to 8.1%. - sugars are still in the 200s, unchanged much from before >> we discussed to switch to Ozempic high dose for max GLP1 R agonnist  effect - given coupon card for Ozempic - I suggested to:  Patient Instructions  Please continue: - Basaglar 40 units at bedtime.  - Metformin 1000 mg 2x daily - Jardiance 25 mg in a.m.  Stop Trulicity and start Ozempic 1 mg weekly.  Continue: - Testosterone injection 50 mg weekly.  Please come back for labs halfway between injections, fasting at least 8h, at 8 am.  Please return in 4 months with your sugar log.   - today, HbA1c is 7.9% (slightly better) - continue checking sugars at different times of the day - check 1x a day, rotating checks - advised for yearly eye exams >> he is UTD - Return to clinic in 4 mo with sugar log   2. Hypogonadotropic Hypogonadism - Continues on testosterone intramuscular injections weekly (50 mg) - Reviewed the latest free testosterone level which was slightly high at last visit.  We will need to repeat it.  We did not change the dose at that time. - He continues to have low libido but no breast tension or galactorrhea - We checked his PSA at last visit and this was normal, low - CBC was also normal at last check - Pituitary MRI +  rest of the pituitary tests >> normal - he he does have prostate hypertrophy and sees his urologist yearly.  He has yearly DRE's. - I will advise him to come back fasting, halfway between injections for a new testosterone level  Orders Placed This Encounter  Procedures  . Testosterone, Free, Total, SHBG    Standing Status:   Future    Standing Expiration Date:   08/20/2018  He needs all labs to go through [facility].  Carlus Pavlov, MD PhD [facility]

## 2017-08-20 NOTE — Patient Instructions (Addendum)
Please continue: - Basaglar 40 units at bedtime.  - Metformin 1000 mg 2x daily - Jardiance 25 mg in a.m.  Stop Trulicity and start Ozempic 1 mg weekly.  Continue: - Testosterone injection 50 mg weekly.  Please come back for labs halfway between injections, fasting at least 8h, at 8 am.  Please return in 4 months with your sugar log.

## 2017-09-12 ENCOUNTER — Other Ambulatory Visit: Payer: Self-pay | Admitting: Internal Medicine

## 2017-09-18 ENCOUNTER — Other Ambulatory Visit: Payer: Self-pay | Admitting: Internal Medicine

## 2017-09-19 NOTE — Telephone Encounter (Signed)
Is this okay to refill? Can be sent electronically by provider

## 2017-09-19 NOTE — Telephone Encounter (Signed)
OK to refill

## 2017-10-09 ENCOUNTER — Ambulatory Visit: Payer: 59 | Admitting: Internal Medicine

## 2017-10-18 ENCOUNTER — Other Ambulatory Visit: Payer: Self-pay | Admitting: Internal Medicine

## 2017-10-22 NOTE — Telephone Encounter (Signed)
Keep the appt with Dr. Dayton MartesAron on 11/11/17 for more refills.

## 2017-11-11 ENCOUNTER — Encounter: Payer: Self-pay | Admitting: Family Medicine

## 2017-11-11 ENCOUNTER — Ambulatory Visit: Payer: 59 | Admitting: Family Medicine

## 2017-11-11 VITALS — BP 128/80 | HR 84 | Temp 99.4°F | Ht 69.0 in | Wt 213.4 lb

## 2017-11-11 DIAGNOSIS — F32A Depression, unspecified: Secondary | ICD-10-CM

## 2017-11-11 DIAGNOSIS — F329 Major depressive disorder, single episode, unspecified: Secondary | ICD-10-CM | POA: Diagnosis not present

## 2017-11-11 DIAGNOSIS — E1121 Type 2 diabetes mellitus with diabetic nephropathy: Secondary | ICD-10-CM

## 2017-11-11 DIAGNOSIS — E1165 Type 2 diabetes mellitus with hyperglycemia: Secondary | ICD-10-CM

## 2017-11-11 DIAGNOSIS — IMO0002 Reserved for concepts with insufficient information to code with codable children: Secondary | ICD-10-CM

## 2017-11-11 DIAGNOSIS — Z23 Encounter for immunization: Secondary | ICD-10-CM

## 2017-11-11 DIAGNOSIS — R5383 Other fatigue: Secondary | ICD-10-CM | POA: Diagnosis not present

## 2017-11-11 LAB — POCT UA - MICROALBUMIN
Creatinine, POC: 10 mg/dL
MICROALBUMIN (UR) POC: 10 mg/L

## 2017-11-11 LAB — POCT GLYCOSYLATED HEMOGLOBIN (HGB A1C): Hemoglobin A1C: 8.7

## 2017-11-11 MED ORDER — BUPROPION HCL ER (XL) 150 MG PO TB24: 150.0000 mg | ORAL_TABLET | Freq: Every day | ORAL | 3 refills | Status: DC

## 2017-11-11 NOTE — Patient Instructions (Addendum)
Good to see you. We are starting Wellbutrin 150 mg XL every morning.     Great to see you. I will call you with your lab results from today and you can view them online.

## 2017-11-11 NOTE — Assessment & Plan Note (Signed)
Deteriorated. Declined psychotherapy. Denies any SI or HI. Start Wellbutrin XL 150 mg daily. Follow up in 3-4 weeks. The patient indicates understanding of these issues and agrees with the plan.

## 2017-11-11 NOTE — Assessment & Plan Note (Signed)
Likely multifactorial based on his symptoms. PHQ is high at 18.  Fatigue may be either a component or cause of his depression. Will check labs today.  See below for depression A and P.

## 2017-11-11 NOTE — Progress Notes (Signed)
Subjective:   Patient ID: Martin Downs, male    DOB: August 09, 1965, 52 y.o.   MRN: [id number]  Pawel Soules is a pleasant 52 y.o. year old male who presents to clinic today with Diabetes (Patient is here today to follow-up with DM. States that at home his sugars run 200-230 depending on if he is fasting or not.  ) and Fatigue (Patietn is here C/O fatigue.  He states that he has felt this way for about a year now. But for about 3 months now he doesn't even do anything during the weekends because of the fatigue.)  on 11/11/2017  HPI:  DM- followed by Dr. Cruzita Lederer. Last saw her on 08/20/17. Note reviewed. Advised to continue: Basaglar 40 units at bedtime.  - Metformin 1000 mg 2x daily - Jardiance 25 mg in a.m.  Lab Results  Component Value Date   HGBA1C 8.7 11/11/2017    Very fatigued. Taking Paxil 30 mg daily but feels it isn't helping for depression.  He has continued to take it as it has helped with premature ejaculation. He is tired all the time.  Does not sleep well.  Went for sleep study twice- per pt, both studies inconclusive.   Current Outpatient Medications on File Prior to Visit  Medication Sig Dispense Refill  . aspirin 81 MG tablet Take 81 mg by mouth daily.      . BD INSULIN SYRINGE U/F 31G X 5/16" 0.5 ML MISC USE 3 TIMES DAILY 100 each 0  . Blood Glucose Monitoring Suppl (ONE TOUCH ULTRA MINI) W/DEVICE KIT Use as directed to test blood sugar twice daily 250.00 1 each 0  . empagliflozin (JARDIANCE) 25 MG TABS tablet Take 25 mg by mouth daily. 30 tablet 2  . fenofibrate 160 MG tablet TAKE 1 TABLET (160 MG TOTAL) BY MOUTH DAILY. 90 tablet 1  . fenofibrate 160 MG tablet TAKE 1 TABLET BY MOUTH EVERY DAY 30 tablet 0  . fluticasone (FLONASE) 50 MCG/ACT nasal spray SPRAY 2 SPRAYS INTO EACH NOSTRIL EVERY DAY 16 g 5  . Insulin Glargine (BASAGLAR KWIKPEN) 100 UNIT/ML SOPN Inject 0.4 mLs (40 Units total) into the skin at bedtime. 15 pen 2  . Insulin Glargine (BASAGLAR  KWIKPEN) 100 UNIT/ML SOPN INJECT 0.15 MLS (15 UNITS TOTAL) INTO THE SKIN AT BEDTIME. 45 pen 1  . Insulin Pen Needle 32G X 4 MM MISC Use to inject insulin 1 time daily as instructed. 200 each 2  . Insulin Syringe-Needle U-100 (B-D INSULIN SYRINGE 1CC/25GX1") 25G X 1" 1 ML MISC Use to inject testosterone weekly 100 each 5  . metFORMIN (GLUCOPHAGE) 1000 MG tablet TAKE 1 TABLET (1,000 MG TOTAL) BY MOUTH 2 (TWO) TIMES DAILY WITH A MEAL. 180 tablet 0  . methadone (DOLOPHINE) 10 MG/ML solution Take 180 mg by mouth daily.     . Multiple Vitamin (MULTIVITAMIN) tablet Take 2 tablets by mouth 2 (two) times daily.    . ONE TOUCH ULTRA TEST test strip USE 8 TIMES A DAY AS INSTRUCTED 300 each 4  . ONETOUCH DELICA LANCETS 11B MISC USE TO TEST BLOOD SUGAR 4 TIMES DAILY AS INSTRUCTED. 200 each 3  . PARoxetine (PAXIL) 30 MG tablet TAKE 1 TABLET BY MOUTH EVERY DAY 30 tablet 0  . rosuvastatin (CRESTOR) 10 MG tablet TAKE 1 TABLET (10 MG TOTAL) BY MOUTH DAILY. 90 tablet 1  . Semaglutide (OZEMPIC) 1 MG/DOSE SOPN Inject 1 mg into the skin once a week. 4 pen 2  . sildenafil (  VIAGRA) 100 MG tablet Take 0.5-1 tablets (50-100 mg total) by mouth daily as needed for erectile dysfunction. 5 tablet 11  . Syringe/Needle, Disp, (SYRINGE 3CC/18GX1-1/2") 18G X 1-1/2" 3 ML MISC Use to draw up testosterone 100 each 0  . Syringe/Needle, Disp, 18G X 1" 1 ML MISC Use to draw up testosterone 100 each 5  . testosterone cypionate (DEPOTESTOSTERONE CYPIONATE) 200 MG/ML injection INJECT 0.25 MILLILITERS INTO THE MUSCLE ONCE A WEEK 10 mL 0  . vitamin B-12 (CYANOCOBALAMIN) 1000 MCG tablet Take 1,000 mcg by mouth daily.     No current facility-administered medications on file prior to visit.     Allergies  Allergen Reactions  . Gemfibrozil Diarrhea    Past Medical History:  Diagnosis Date  . Anxiety   . Borderline diabetes   . Chronic pain   . Depression   . Diabetes mellitus without complication ([place])   . Hyperlipidemia   .  Narcotic dependence ([place])   . Sleep apnea    no cpap    Past Surgical History:  Procedure Laterality Date  . CHOLECYSTECTOMY N/A 05/30/2016   Procedure: LAPAROSCOPIC CHOLECYSTECTOMY WITH INTRAOPERATIVE CHOLANGIOGRAM;  Surgeon: Donnie Mesa, MD;  Location: [facility];  Service: General;  Laterality: N/A;  . CHOLECYSTECTOMY N/A 05/30/2016   Procedure: OPEN COMMON BILE DUCT EXPLORATION; INSERTION OF T-TUBE;  Surgeon: Donnie Mesa, MD;  Location: [facility];  Service: General;  Laterality: N/A;  . removal of bone spur Bilateral 1995   elbps  . TOOTH EXTRACTION  2017  . UMBILICAL HERNIA REPAIR N/A 05/30/2016   Procedure: HERNIA REPAIR UMBILICAL ADULT;  Surgeon: Donnie Mesa, MD;  [facility];  Service: General;  Laterality: N/A;    Family History  Problem Relation Age of Onset  . Heart disease Father   . Cancer Father   . Colon cancer Neg Hx     Social History   Socioeconomic History  . Marital status: Married    Spouse name: Not on file  . Number of children: Y  . Years of education: Not on file  . Highest education level: Not on file  Occupational History  . Occupation: Music therapist: UNEMPLOYED  Social Needs  . Financial resource strain: Not on file  . Food insecurity:    Worry: Not on file    Inability: Not on file  . Transportation needs:    Medical: Not on file    Non-medical: Not on file  Tobacco Use  . Smoking status: Never Smoker  . Smokeless tobacco: Current User    Types: Snuff  Substance and Sexual Activity  . Alcohol use: No  . Drug use: No  . Sexual activity: Not on file  Lifestyle  . Physical activity:    Days per week: Not on file    Minutes per session: Not on file  . Stress: Not on file  Relationships  . Social connections:    Talks on phone: Not on file    Gets together: Not on file    Attends religious service: Not on file    Active member of club or organization: Not on file    Attends meetings of clubs or organizations: Not on file     Relationship status: Not on file  . Intimate partner violence:    Fear of current or ex partner: Not on file    Emotionally abused: Not on file    Physically abused: Not on file    Forced sexual activity: Not on file  Other Topics Concern  . Not on file  Social History Narrative  . Not on file   The PMH, PSH, Social History, Family History, Medications, and allergies have been reviewed in [facility], and have been updated if relevant.   Review of Systems  Constitutional: Positive for fatigue.  Respiratory: Negative.   Cardiovascular: Negative.   Musculoskeletal: Negative.   Psychiatric/Behavioral: Positive for dysphoric mood and sleep disturbance. Negative for agitation, behavioral problems, confusion, decreased concentration, hallucinations, self-injury and suicidal ideas. The patient is not nervous/anxious and is not hyperactive.   All other systems reviewed and are negative.      Objective:    BP 128/80 (BP Location: Left Arm, Patient Position: Sitting, Cuff Size: Normal)   Pulse 84   Temp 99.4 F (37.4 C) (Oral)   Ht '5\' 9"'$  (1.753 m)   Wt 213 lb 6.4 oz (96.8 kg)   SpO2 97%   BMI 31.51 kg/m    Physical Exam   General:  pleasant male in no acute distress Eyes:  PERRL Ears:  External ear exam shows no significant lesions or deformities.  TMs normal bilaterally Hearing is grossly normal bilaterally. Nose:  External nasal examination shows no deformity or inflammation. Nasal mucosa are pink and moist without lesions or exudates. Mouth:  Oral mucosa and oropharynx without lesions or exudates.  Teeth in good repair. Neck:  no carotid bruit or thyromegaly no cervical or supraclavicular lymphadenopathy  Lungs:  Normal respiratory effort, chest expands symmetrically. Lungs are clear to auscultation, no crackles or wheezes. Heart:  Normal rate and regular rhythm. S1 and S2 normal without gallop, murmur, click, rub or other extra sounds. Abdomen:  Bowel sounds positive,abdomen soft  and non-tender without masses, organomegaly or hernias noted. Pulses:  R and L posterior tibial pulses are full and equal bilaterally  Extremities:  no edema  Psych:  Good eye contact, not anxious or depressed appearing      Assessment & Plan:   Uncontrolled type 2 diabetes mellitus with nephropathy ([place]) - Plan: POCT HgB A1C, POCT UA - Microalbumin  Need for Tdap vaccination - Plan: Tdap vaccine greater than or equal to 7yo IM  Other fatigue - Plan: CBC, B Nat Peptide, TSH No follow-ups on file.

## 2017-11-12 LAB — CBC
HEMATOCRIT: 48.8 % (ref 39.0–52.0)
HEMOGLOBIN: 16.2 g/dL (ref 13.0–17.0)
MCHC: 33.2 g/dL (ref 30.0–36.0)
MCV: 94.3 fl (ref 78.0–100.0)
Platelets: 196 10*3/uL (ref 150.0–400.0)
RBC: 5.18 Mil/uL (ref 4.22–5.81)
RDW: 13.9 % (ref 11.5–15.5)
WBC: 10.4 10*3/uL (ref 4.0–10.5)

## 2017-11-12 LAB — TSH: TSH: 2.79 u[IU]/mL (ref 0.35–4.50)

## 2017-11-12 LAB — BRAIN NATRIURETIC PEPTIDE: PRO B NATRI PEPTIDE: 18 pg/mL (ref 0.0–100.0)

## 2017-11-14 ENCOUNTER — Telehealth: Payer: Self-pay

## 2017-11-14 ENCOUNTER — Other Ambulatory Visit: Payer: Self-pay | Admitting: Family Medicine

## 2017-11-14 MED ORDER — TRAZODONE HCL 50 MG PO TABS
25.0000 mg | ORAL_TABLET | Freq: Every evening | ORAL | 3 refills | Status: DC | PRN
Start: 2017-11-14 — End: 2017-12-11

## 2017-11-14 NOTE — Telephone Encounter (Signed)
-----   Message from Dianne Dunalia M Aron, MD sent at 11/14/2017  7:38 AM EDT ----- Yes let's try trazodone.  eRx sent to pharmacy on file. Please keep us updated.

## 2017-11-14 NOTE — Telephone Encounter (Signed)
I LMOVM stating to pt that Dr. [place] MartesAron sent in something that would help to his pharmacy on file and to try that and keep us updated/to call if has any questions/PEC ok to give complete information as the name of the medication is Trazodone and I did not release that information on VM/thx dmf

## 2017-11-16 ENCOUNTER — Other Ambulatory Visit: Payer: Self-pay | Admitting: Internal Medicine

## 2017-11-22 ENCOUNTER — Telehealth: Payer: Self-pay | Admitting: Nurse Practitioner

## 2017-11-22 ENCOUNTER — Other Ambulatory Visit: Payer: Self-pay | Admitting: Internal Medicine

## 2017-11-25 ENCOUNTER — Telehealth: Payer: Self-pay | Admitting: Nurse Practitioner

## 2017-11-28 NOTE — Telephone Encounter (Signed)
Relation to pt: self Call back number: [facility]: [facility] [address] [place] - [address] [phone] (Phone) [phone] (Fax)    Reason for call:   Patient last seen 11/11/17, pharmacy requested Fenofibrate 160 MG  Rx on 11/25/17, patient inquiring why medication was denied, please advise

## 2017-11-28 NOTE — Telephone Encounter (Addendum)
Relation to pt: self Call back number: [facility]: [facility] [address] [place] - [address] [phone] (Phone) [phone] (Fax)    Reason for call:   Patient last seen 11/11/17, pharmacy requested PARoxetine HCl 30 MG  Rx on 11/25/17, patient inquiring why medication was denied, please advise

## 2017-11-29 ENCOUNTER — Other Ambulatory Visit: Payer: Self-pay

## 2017-11-29 MED ORDER — FENOFIBRATE 160 MG PO TABS: 160.0000 mg | ORAL_TABLET | Freq: Every day | ORAL | 5 refills | Status: DC

## 2017-11-29 NOTE — Telephone Encounter (Signed)
Please help per [place], Dr. [place] Martes pt.

## 2017-11-29 NOTE — Telephone Encounter (Signed)
Sent in Rx/LMOVM for pt/thx dmf

## 2017-12-11 ENCOUNTER — Other Ambulatory Visit: Payer: 59

## 2017-12-11 ENCOUNTER — Encounter: Payer: Self-pay | Admitting: Family Medicine

## 2017-12-11 ENCOUNTER — Ambulatory Visit: Payer: 59 | Admitting: Family Medicine

## 2017-12-11 ENCOUNTER — Other Ambulatory Visit: Payer: Self-pay

## 2017-12-11 VITALS — BP 124/74 | HR 70 | Temp 97.7°F | Ht 69.0 in | Wt 208.8 lb

## 2017-12-11 DIAGNOSIS — F329 Major depressive disorder, single episode, unspecified: Secondary | ICD-10-CM | POA: Diagnosis not present

## 2017-12-11 DIAGNOSIS — IMO0002 Reserved for concepts with insufficient information to code with codable children: Secondary | ICD-10-CM

## 2017-12-11 DIAGNOSIS — E1165 Type 2 diabetes mellitus with hyperglycemia: Principal | ICD-10-CM

## 2017-12-11 DIAGNOSIS — E230 Hypopituitarism: Secondary | ICD-10-CM | POA: Diagnosis not present

## 2017-12-11 DIAGNOSIS — F32A Depression, unspecified: Secondary | ICD-10-CM

## 2017-12-11 DIAGNOSIS — G4700 Insomnia, unspecified: Secondary | ICD-10-CM | POA: Diagnosis not present

## 2017-12-11 DIAGNOSIS — E1121 Type 2 diabetes mellitus with diabetic nephropathy: Secondary | ICD-10-CM

## 2017-12-11 LAB — COMPLETE METABOLIC PANEL WITH GFR
AG Ratio: 2.3 (calc) (ref 1.0–2.5)
ALT: 37 U/L (ref 9–46)
AST: 27 U/L (ref 10–35)
Albumin: 4.5 g/dL (ref 3.6–5.1)
Alkaline phosphatase (APISO): 23 U/L — ABNORMAL LOW (ref 40–115)
BUN: 13 mg/dL (ref 7–25)
CO2: 31 mmol/L (ref 20–32)
CREATININE: 1.26 mg/dL (ref 0.70–1.33)
Calcium: 9.5 mg/dL (ref 8.6–10.3)
Chloride: 101 mmol/L (ref 98–110)
GFR, EST NON AFRICAN AMERICAN: 65 mL/min/{1.73_m2} (ref >=60)
GFR, Est African American: 75 mL/min/{1.73_m2} (ref >=60)
GLOBULIN: 2 g/dL (ref 1.9–3.7)
Glucose, Bld: 159 mg/dL — ABNORMAL HIGH (ref 65–99)
Potassium: 4.6 mmol/L (ref 3.5–5.3)
Sodium: 139 mmol/L (ref 135–146)
TOTAL PROTEIN: 6.5 g/dL (ref 6.1–8.1)
Total Bilirubin: 0.8 mg/dL (ref 0.2–1.2)

## 2017-12-11 MED ORDER — TRAZODONE HCL 150 MG PO TABS: 150.0000 mg | ORAL_TABLET | Freq: Every evening | ORAL | 3 refills | Status: DC | PRN

## 2017-12-11 MED ORDER — BUPROPION HCL ER (XL) 300 MG PO TB24: 300.0000 mg | ORAL_TABLET | Freq: Every day | ORAL | 3 refills | Status: DC

## 2017-12-11 NOTE — Patient Instructions (Signed)
Great to see you. We are increasing your Trazodone to 150 mg nightly and your Wellbutrin to 300 mg XL daily. Please update me in a few weeks.

## 2017-12-11 NOTE — Assessment & Plan Note (Signed)
>  25 minutes spent in face to face time with patient, >50% spent in counselling or coordination of care discussing depression and insominia.  Some subjective improvement of symptoms, although PHQ9 score has not improved. He would like to try increased dose of Wellbutrin which is reasonable.  Will increase to Wellbutrin to 300 mg XL daily and trazodone to 150 mg nightly as needed for insomnia. The patient indicates understanding of these issues and agrees with the plan.

## 2017-12-11 NOTE — Progress Notes (Signed)
Subjective:   Patient ID: Martin Downs, male    DOB: 1965/09/15, 52 y.o.   MRN: [id number]  Martin Downs is a pleasant 52 y.o. year old male who presents to clinic today with Follow-up (Patient is here today to F/U with depression.  On 4.15.19 he was started on Wellbutrin XL '150mg'$  1qd and declined referral to psych but agreed to F/U plan in 3-4 weeks and at that time his PSQ was 18.  He states that he feels a slight bit better but not where he needs to be.  He also states that he cannot sleep still on Trazodone '50mg'$ .)  on 12/11/2017  HPI:  Here for follow up of fatigue/depression- When I saw him on 11/11/17, he felt Paxil 30 mg was not controlled his depression.  PHQ was 18 at the time.  Started Wellbutrin 150 mg XL daily but declined psych referral.  Also started Trazodone 50 nightly which has not helped with insomnia.  He does feel the wellbutrin has helped some- "I don't want to lay around all day as often."  Current Outpatient Medications on File Prior to Visit  Medication Sig Dispense Refill  . aspirin 81 MG tablet Take 81 mg by mouth daily.      . BD INSULIN SYRINGE U/F 31G X 5/16" 0.5 ML MISC USE 3 TIMES DAILY 100 each 0  . Blood Glucose Monitoring Suppl (ONE TOUCH ULTRA MINI) W/DEVICE KIT Use as directed to test blood sugar twice daily 250.00 1 each 0  . buPROPion (WELLBUTRIN XL) 150 MG 24 hr tablet Take 1 tablet (150 mg total) by mouth daily. 30 tablet 3  . fenofibrate 160 MG tablet Take 1 tablet (160 mg total) by mouth daily. 30 tablet 5  . fluticasone (FLONASE) 50 MCG/ACT nasal spray SPRAY 2 SPRAYS INTO EACH NOSTRIL EVERY DAY 16 g 5  . Insulin Glargine (BASAGLAR KWIKPEN) 100 UNIT/ML SOPN Inject 0.4 mLs (40 Units total) into the skin at bedtime. 15 pen 2  . Insulin Glargine (BASAGLAR KWIKPEN) 100 UNIT/ML SOPN INJECT 0.15 MLS (15 UNITS TOTAL) INTO THE SKIN AT BEDTIME. 45 pen 1  . Insulin Pen Needle 32G X 4 MM MISC Use to inject insulin 1 time daily as instructed. 200 each  2  . Insulin Syringe-Needle U-100 (B-D INSULIN SYRINGE 1CC/25GX1") 25G X 1" 1 ML MISC Use to inject testosterone weekly 100 each 5  . JARDIANCE 25 MG TABS tablet TAKE 1 TABLET BY MOUTH EVERY DAY 30 tablet 2  . metFORMIN (GLUCOPHAGE) 1000 MG tablet TAKE 1 TABLET (1,000 MG TOTAL) BY MOUTH 2 (TWO) TIMES DAILY WITH A MEAL. 180 tablet 0  . methadone (DOLOPHINE) 10 MG/ML solution Take 180 mg by mouth daily.     . Multiple Vitamin (MULTIVITAMIN) tablet Take 2 tablets by mouth 2 (two) times daily.    . ONE TOUCH ULTRA TEST test strip USE 8 TIMES A DAY AS INSTRUCTED 300 each 4  . ONETOUCH DELICA LANCETS 16P MISC USE TO TEST BLOOD SUGAR 4 TIMES DAILY AS INSTRUCTED. 200 each 3  . OZEMPIC 1 MG/DOSE SOPN INJECT 1 MG INTO THE SKIN ONCE A WEEK. 3 pen 2  . PARoxetine (PAXIL) 30 MG tablet TAKE 1 TABLET BY MOUTH EVERY DAY 30 tablet 0  . rosuvastatin (CRESTOR) 10 MG tablet TAKE 1 TABLET BY MOUTH EVERY DAY 90 tablet 1  . sildenafil (VIAGRA) 100 MG tablet Take 0.5-1 tablets (50-100 mg total) by mouth daily as needed for erectile dysfunction. 5 tablet 11  .  Syringe/Needle, Disp, (SYRINGE 3CC/18GX1-1/2") 18G X 1-1/2" 3 ML MISC Use to draw up testosterone 100 each 0  . Syringe/Needle, Disp, 18G X 1" 1 ML MISC Use to draw up testosterone 100 each 5  . testosterone cypionate (DEPOTESTOSTERONE CYPIONATE) 200 MG/ML injection INJECT 0.25 MILLILITERS INTO THE MUSCLE ONCE A WEEK 10 mL 0  . traZODone (DESYREL) 50 MG tablet Take 0.5-1 tablets (25-50 mg total) by mouth at bedtime as needed for sleep. 30 tablet 3  . vitamin B-12 (CYANOCOBALAMIN) 1000 MCG tablet Take 1,000 mcg by mouth daily.     No current facility-administered medications on file prior to visit.     Allergies  Allergen Reactions  . Gemfibrozil Diarrhea    Past Medical History:  Diagnosis Date  . Anxiety   . Borderline diabetes   . Chronic pain   . Depression   . Diabetes mellitus without complication ([place])   . Hyperlipidemia   . Narcotic dependence  ([place])   . Sleep apnea    no cpap    Past Surgical History:  Procedure Laterality Date  . CHOLECYSTECTOMY N/A 05/30/2016   Procedure: LAPAROSCOPIC CHOLECYSTECTOMY WITH INTRAOPERATIVE CHOLANGIOGRAM;  Surgeon: Donnie Mesa, MD;  Location: [facility];  Service: General;  Laterality: N/A;  . CHOLECYSTECTOMY N/A 05/30/2016   Procedure: OPEN COMMON BILE DUCT EXPLORATION; INSERTION OF T-TUBE;  Surgeon: Donnie Mesa, MD;  Location: [facility];  Service: General;  Laterality: N/A;  . removal of bone spur Bilateral 1995   elbps  . TOOTH EXTRACTION  2017  . UMBILICAL HERNIA REPAIR N/A 05/30/2016   Procedure: HERNIA REPAIR UMBILICAL ADULT;  Surgeon: Donnie Mesa, MD;  [facility];  Service: General;  Laterality: N/A;    Family History  Problem Relation Age of Onset  . Heart disease Father   . Cancer Father   . Colon cancer Neg Hx     Social History   Socioeconomic History  . Marital status: Married    Spouse name: Not on file  . Number of children: Y  . Years of education: Not on file  . Highest education level: Not on file  Occupational History  . Occupation: Music therapist: UNEMPLOYED  Social Needs  . Financial resource strain: Not on file  . Food insecurity:    Worry: Not on file    Inability: Not on file  . Transportation needs:    Medical: Not on file    Non-medical: Not on file  Tobacco Use  . Smoking status: Never Smoker  . Smokeless tobacco: Current User    Types: Snuff  Substance and Sexual Activity  . Alcohol use: No  . Drug use: No  . Sexual activity: Not on file  Lifestyle  . Physical activity:    Days per week: Not on file    Minutes per session: Not on file  . Stress: Not on file  Relationships  . Social connections:    Talks on phone: Not on file    Gets together: Not on file    Attends religious service: Not on file    Active member of club or organization: Not on file    Attends meetings of clubs or organizations: Not on file    Relationship status:  Not on file  . Intimate partner violence:    Fear of current or ex partner: Not on file    Emotionally abused: Not on file    Physically abused: Not on file    Forced sexual activity: Not on  file  Other Topics Concern  . Not on file  Social History Narrative  . Not on file   The PMH, PSH, Social History, Family History, Medications, and allergies have been reviewed in [facility], and have been updated if relevant.   Review of Systems  Psychiatric/Behavioral: Positive for dysphoric mood and sleep disturbance. Negative for agitation, behavioral problems, confusion, decreased concentration, hallucinations, self-injury and suicidal ideas. The patient is not nervous/anxious and is not hyperactive.   All other systems reviewed and are negative.      Objective:    BP 124/74 (BP Location: Left Arm, Patient Position: Sitting, Cuff Size: Normal)   Pulse 70   Temp 97.7 F (36.5 C) (Oral)   Ht '5\' 9"'$  (1.753 m)   Wt 208 lb 12.8 oz (94.7 kg)   SpO2 (!) 69%   BMI 30.83 kg/m    Physical Exam  General:  pleasant male in no acute distress Eyes:  PERRL Ears:  External ear exam shows no significant lesions or deformities.  TMs normal bilaterally Hearing is grossly normal bilaterally. Nose:  External nasal examination shows no deformity or inflammation. Nasal mucosa are pink and moist without lesions or exudates. Mouth:  Oral mucosa and oropharynx without lesions or exudates.  Teeth in good repair. Neck:  no carotid bruit or thyromegaly no cervical or supraclavicular lymphadenopathy  Lungs:  Normal respiratory effort, chest expands symmetrically. Lungs are clear to auscultation, no crackles or wheezes. Heart:  Normal rate and regular rhythm. S1 and S2 normal without gallop, murmur, click, rub or other extra sounds. Abdomen:  Bowel sounds positive,abdomen soft and non-tender without masses, organomegaly or hernias noted. Pulses:  R and L posterior tibial pulses are full and equal bilaterally    Extremities:  no edema  Psych:  Good eye contact, not anxious or depressed appearing         Assessment & Plan:   Depression, unspecified depression type  Insomnia, unspecified type No follow-ups on file.

## 2017-12-12 LAB — TESTOSTERONE, FREE, TOTAL, SHBG
Sex Hormone Binding: 39.5 nmol/L (ref 19.3–76.4)
TESTOSTERONE: 485 ng/dL (ref 264–916)
Testosterone, Free: 14.6 pg/mL (ref 7.2–24.0)

## 2017-12-16 ENCOUNTER — Other Ambulatory Visit: Payer: Self-pay

## 2017-12-16 ENCOUNTER — Telehealth: Payer: Self-pay | Admitting: Family Medicine

## 2017-12-16 MED ORDER — PAROXETINE HCL 30 MG PO TABS: 30.0000 mg | ORAL_TABLET | Freq: Every day | ORAL | 0 refills | Status: DC

## 2017-12-16 NOTE — Telephone Encounter (Signed)
Copied from CRM [id number]. Topic: Quick Communication - See Telephone Encounter >> Dec 16, 2017  1:01 PM Lorrine Kin, [place] wrote: CRM for notification. See Telephone encounter for: 12/16/17. Patient is calling and is wondering why his PARoxetine (PAXIL) 30 MG tablet was declined. Would like a call to discuss this or a prescription sent to the pharmacy. Please advise. CB#: [phone]

## 2017-12-17 ENCOUNTER — Ambulatory Visit: Payer: 59 | Admitting: Internal Medicine

## 2017-12-17 NOTE — Telephone Encounter (Signed)
Pt aware/he will call back before he needs a refill if it is ineffective/thx dmf

## 2017-12-17 NOTE — Telephone Encounter (Signed)
TA-Pt states that he is not having a problem getting to sleep but the problem is staying asleep/he wakes up after about 2 hours and then doses off and on/States that has been taking the Trazodone at  and is wondering if this takes a while to become effective? Plz advise/thx dmf   I also took care of the Paxil that is mentioned below/thx dmf

## 2017-12-17 NOTE — Telephone Encounter (Signed)
Yes I would give it a little more time.

## 2017-12-18 ENCOUNTER — Ambulatory Visit (INDEPENDENT_AMBULATORY_CARE_PROVIDER_SITE_OTHER): Payer: 59 | Admitting: Internal Medicine

## 2017-12-18 ENCOUNTER — Encounter: Payer: Self-pay | Admitting: Internal Medicine

## 2017-12-18 VITALS — BP 136/84 | HR 80 | Ht 69.0 in | Wt 207.4 lb

## 2017-12-18 DIAGNOSIS — E1121 Type 2 diabetes mellitus with diabetic nephropathy: Secondary | ICD-10-CM | POA: Diagnosis not present

## 2017-12-18 DIAGNOSIS — E1165 Type 2 diabetes mellitus with hyperglycemia: Secondary | ICD-10-CM

## 2017-12-18 DIAGNOSIS — E230 Hypopituitarism: Secondary | ICD-10-CM | POA: Diagnosis not present

## 2017-12-18 DIAGNOSIS — E039 Hypothyroidism, unspecified: Secondary | ICD-10-CM

## 2017-12-18 DIAGNOSIS — E221 Hyperprolactinemia: Secondary | ICD-10-CM | POA: Diagnosis not present

## 2017-12-18 DIAGNOSIS — IMO0002 Reserved for concepts with insufficient information to code with codable children: Secondary | ICD-10-CM

## 2017-12-18 MED ORDER — INSULIN DEGLUDEC 200 UNIT/ML ~~LOC~~ SOPN: 60.0000 [IU] | PEN_INJECTOR | Freq: Every day | SUBCUTANEOUS | 3 refills | Status: DC

## 2017-12-18 NOTE — Progress Notes (Addendum)
Patient ID: Martin Downs, male   DOB: July 20, 1966, 52 y.o.   MRN: [id number]  HPI: Martin Downs is a 52 y.o.-year-old male, returning for f/u for DM2 dx 2014, insulin-dependent since 02/2014, uncontrolled, with complications (mild CKD) and hypogonadotropic hypogonadism. Last visit 4 months ago.  DM2: Last hemoglobin A1c was: Lab Results  Component Value Date   HGBA1C 8.7 11/11/2017   HGBA1C 8.1 04/15/2017   HGBA1C 9.1 10/02/2016   He now takes: - Basaglar 40 units at bedtime - restarted 09/2016 - Trulicity 1.5 mg weekly - restarted 09/2016 Ozempic 1 mg weekly - changed 07/2017 - Metformin 1000 mg 2x daily - Farxiga 10 mg daily in am >>  Jardiance 25 mg daily We stopped Actoplus 15-850 mg in 02/2014. We stopped Glipizide when we increased Novolog. Insurance does not cover Invokana. We tried to add Actos 15 mg daily (added back 06/2016) >> stopped   Pt checks sugars 2-3 times a day, nonfasting, as he is almost never on an empty stomach: - am:  200-250 >> 212-243 >> 230-250 - 2h after b'fast:114 >> n/c >>  239, 247 >> n/c - lunch: 150-200 >> n/c >> 215 >> 230 - 2h after lunch: 210-252, 300 >> n/c >> 230-280 - dinner:  200-250 >> 211, 231 >> n/c - 2h after dinner:  241-379 >> n/c >> 219, 253 >> 220-280 - bedtime:200-220 >> n/c >> 240-284 >> n/c  - nighttime: 130-218 >> n/c >> 239 >> n/c  Higher: 300s.  Meter: One Touch Ultra mini  Pt's meals are -he grazes throughout the day: - Breakfast: oatmeal or yoghurt or fruit bar/cup (largest meal) - Lunch: PB sandwich - Dinner: chicken leftovers - Snacks: sandwich, 1 pack of crackers, diet Mountain dew  -  + Mild CKD, last BUN/creatinine:  Lab Results  Component Value Date   BUN 13 12/11/2017   CREATININE 1.26 12/11/2017  Not on an ACE inhibitor. -+ HL; last set of lipids: Lab Results  Component Value Date   CHOL 104 04/30/2017   HDL 32 (L) 04/30/2017   LDLCALC 31 04/30/2017   LDLDIRECT 136.0 11/01/2014   TRIG 204  (H) 04/30/2017   CHOLHDL 3.3 04/30/2017  On Crestor and fenofibrate. - last eye exam was on 12/2016: No DR - no numbness and tingling in his feet.  Hypogonadotropic hypogonadism: - He was on Androgel  in 2015 >> his testosterone levels did not increase and he did not feel better  >> now on IM testosterone 50 mg weekly  Reviewed his testosterone levels going back to 2016 - most recent level was normal:   Component     Latest Ref Rng & Units 04/30/2017 12/11/2017  Testosterone     264 - 916 ng/dL 2,952 (H) 841  Testosterone Free     7.2 - 24.0 pg/mL 28.6 (H) 14.6  Sex Horm Binding Glob, Serum     19.3 - 76.4 nmol/L 33.0 39.5   Component     Latest Ref Rng & Units 01/16/2016  Testosterone     250 - 827 ng/dL 3,244 (H)  Albumin     3.6 - 5.1 g/dL 4.4  Sex Horm Binding Glob, Serum     10 - 50 nmol/L 28  Testosterone Free     47.0 - 244.0 pg/mL 185.8  Testosterone, Bioavailable     130.5 - 681.7 ng/dL 010.2   Component     Latest Ref Rng & Units 07/18/2015 10/18/2015 10/18/2015  7:52 AM  7:52 AM  Testosterone     250 - 827 ng/dL 981 (L) 191 (H) 478 (H)  Albumin     3.6 - 5.1 g/dL   4.5  Sex Horm Binding Glob, Serum     10 - 50 nmol/L 34 25 25  Testosterone Free     47.0 - 244.0 pg/mL 23.9 (L) 259.1 (H) 186.3  Testosterone, Bioavailable     130.5 - 681.7 ng/dL   295.6  Testosterone-% Free     1.6 - 2.9 % 1.8 2.7    PSA levels -stable, normal Component     Latest Ref Rng & Units 04/30/2017  Prostate Specific Ag, Serum     0.0 - 4.0 ng/mL 0.7   Lab Results  Component Value Date   PSA 0.69 03/01/2016   PSA 0.91 10/18/2015   PSA 0.37 01/17/2010   PSA 0.55 11/07/2009   Hb/HT normal at last check: Lab Results  Component Value Date   WBC 10.4 11/11/2017   HGB 16.2 11/11/2017   HCT 48.8 11/11/2017   MCV 94.3 11/11/2017   PLT 196.0 11/11/2017   - We checked his pituitary function in 2016 >> labs normal except inappropriately normal LH, FSH, and also low  testosterone.  Prolactin was higher but monomeric prolactin was normal: Component     Latest Ref Rng 05/16/2015  Testosterone     300 - 890 ng/dL 213 (L)  Sex Hormone Binding     10 - 50 nmol/L 28  Testosterone Free     47.0 - 244.0 pg/mL 26.9 (L)  Testosterone-% Free     1.6 - 2.9 % 2.0  IGF-I, LC/MS     50 - 317 ng/mL 99  Z-Score (Male)     -2.0-+2.0 SD -0.7  Prolactin, Total     2.0 - 18.0 ng/mL 35.4 (H)  Prolactin, Monomeric     3.4 - 14.8 ng/mL 11.4  LH     1.50 - 9.30 mIU/mL 2.03  FSH     1.4 - 18.1 mIU/ML 5.9  Cortisol, Plasma      14.2  C206 ACTH     6 - 50 pg/mL 32   Previous labs (drawn at 8:33 AM)   Prolactin 38.6 (2.1-17.1)  FSH 5.6, LH 2.0  Total testosterone 134 (300-890), bioavailable testosterone 35.5 ng/dL (086.5-784.6), free testosterone 18 pg/mL (47-244), SHBG 29 (10-50)  PSA 0.16  Estradiol 17.1 (0-39)   reviewed the report of pituitary MRI 07/14/2015 >>  no pituitary lesions  Hyperprolactinemia: -  found during investigation for low testosterone, by Dr. Mena Goes, his urologist. - Turned out to be caused by macroprolactin by micro prolactin, which does not require further investigation.  Denies gynecomastia, galactorrhea, or breast tenderness.  Patient's alkaline phosphatase was low in 04/2017 (25) so we started a multivitamin.  He also had an elevated TSH in the past, but recent level was normal: Lab Results  Component Value Date   TSH 2.79 11/11/2017   ROS: Constitutional: + weight loss, + fatigue, + subjective hyperthermia, no subjective hypothermia Eyes: no blurry vision, no xerophthalmia ENT: no sore throat, no nodules palpated in throat, no dysphagia, no odynophagia, no hoarseness Cardiovascular: no CP/no SOB/no palpitations/no leg swelling Respiratory: no cough/no SOB/no wheezing Gastrointestinal: no N/no V/no D/+ C/no acid reflux Musculoskeletal: no muscle aches/no joint aches Skin: no rashes, no hair loss Neurological: no  tremors/no numbness/no tingling/no dizziness +  Diff with erections  I reviewed pt's medications, allergies, PMH, social hx, family hx,  and changes were documented in the history of present illness. Otherwise, unchanged from my initial visit note.  Since last visit, he started Wellbutrin and trazodone.  PE: BP 136/84   Pulse 80   Ht  (1.753 m)   Wt 207 lb 6.4 oz (94.1 kg)   SpO2 96%   BMI 30.63 kg/m  Body mass index is 30.63 kg/m.  Wt Readings from Last 3 Encounters:  12/18/17 207 lb 6.4 oz (94.1 kg)  12/11/17 208 lb 12.8 oz (94.7 kg)  11/11/17 213 lb 6.4 oz (96.8 kg)   Constitutional: overweight, in NAD Eyes: PERRLA, EOMI, no exophthalmos ENT: moist mucous membranes, no thyromegaly, no cervical lymphadenopathy  Cardiovascular: RRR, No MRG Respiratory: CTA B Gastrointestinal: abdomen soft, NT, ND, BS+ Musculoskeletal: no deformities, strength intact in all 4 Skin: moist, warm, no rashes Neurological: no tremor with outstretched hands, DTR normal in all 4  ASSESSMENT: 1. DM2, insulin-dependent, uncontrolled, with complications - mild ckd  - We discussed in the past about a VGO system or another type of pump >> but he works outside and sweats a lot >> he does not think he can wear this - We have discussed about a regular insulin pump in the past, but he cannot use any of the attached devices due to the fact that he has hyperhidrosis. - We did discuss about the possibility to refer him to another endocrinologist, possibly at [facility], for a second opinion, but he refuses.   2. Hypogonadotropic hypogonadism  3.  Hyperprolactinemia  4. Elevated TSH  PLAN:  1. Complex patient with uncontrolled diabetes, with increased insulin resistance.  He has an interesting pattern of CBGs that are not significantly affected by any of the changes in his medication regimen that we tried in the past.  He usually works a lot, 20 hours a day reportedly sleeps very little, and eats incessantly  (grazing).  He was off insulin in the past but we had to add back Basaglar few months before last visit.  The HbA1c did improved to 8.1% afterwards.  At last visit, since his sugars were still in the 200s, we  stopped Trulicity and started a high dose of Ozempic, 1 mg weekly. Recent HbA1c level was higher, at 8.7%. - He lost 7 pounds since last visit, possibly the effect of starting Ozempic - however, sugars have not improved and we discussed at this visit to change from Basaglar to [place] U200 and increase the dose by 50%.  We will continue metformin, Jardiance, and Ozempic - I again advised him to try to limit how much he works, since he is still working almost 20 hours a day and sleeping very little. I think this can also contribute to the severity of his diabetes  and the fact that he is not responding to many of the treatments that we are trying.  He tells me that he has to children in school (college, masters) and cannot afford to work less for now - I suggested to:  Patient Instructions  Please continue: - Basaglar 40 units at bedtime.  - Metformin 1000 mg 2x daily - Jardiance 25 mg in a.m. - Ozempic 1 mg weekly  Continue: - Testosterone injection 50 mg weekly.  Please return in 4 months with your sugar log.   - continue checking sugars at different times of the day - check 1-2x a day, rotating checks - advised for yearly eye exams >> he is UTD - Return to clinic in 4 mo with  sugar log   2. Hypogonadotropic Hypogonadism He continues on testosterone intramuscular injections weekly (50 mg).  On this dose, most recent testosterone level was normal.  He is feeling good and does not have complaints. -His PSA levels were stable, normal and he has annual DRE's with his urologist.  He does have prostate hypertrophy. -CBC levels were normal -He continues to have some low libido but no breast tension or galactorrhea -I reviewed the report of his pituitary MRI and the rest of the pituitary  tests and they were all normal.  3.  Hyperprolactinemia  This is due to macro prolactin, so it does not need further investigation  4. Elevated TSH -  resolved per last check a month ago  He needs all labs to go through [facility].  Carlus Pavlov, MD PhD [facility] End[facility]

## 2017-12-18 NOTE — Patient Instructions (Addendum)
Please continue: - Metformin 1000 mg 2x daily - Jardiance 25 mg in a.m. - Ozempic 1 mg weekly  Please stop Basaglar and start Tresiba U200 60 units daily.  Continue: - Testosterone injection 50 mg weekly.  Please return in 4 months with your sugar log.

## 2017-12-22 ENCOUNTER — Other Ambulatory Visit: Payer: Self-pay | Admitting: Internal Medicine

## 2017-12-25 ENCOUNTER — Telehealth: Payer: Self-pay | Admitting: Internal Medicine

## 2017-12-25 NOTE — Telephone Encounter (Signed)
Patient requests a copy of his Labs done 2 weeks ago to be printed and call patient at ph# [phone] when ready for patient to pick up

## 2017-12-25 NOTE — Telephone Encounter (Signed)
Lab results are at the front desk

## 2018-01-07 ENCOUNTER — Telehealth: Payer: Self-pay | Admitting: Family Medicine

## 2018-01-07 MED ORDER — BUPROPION HCL ER (XL) 150 MG PO TB24: 150.0000 mg | ORAL_TABLET | Freq: Every day | ORAL | 2 refills | Status: DC

## 2018-01-07 NOTE — Telephone Encounter (Signed)
Pt stated medication is working about 70-80% but he is hopping to increase the dose for it to work 100%. Pt stated it is working but "its not quite right".

## 2018-01-07 NOTE — Telephone Encounter (Signed)
Copied from CRM [phone]#[id number]. Topic: General - Other >> Jan 07, 2018  7:09 AM Leafy Roobinson, Norma J wrote: Reason for CRM:pt is calling and would like to increase bupropion 300 mg to 350 mg . [facility] [address]

## 2018-01-07 NOTE — Addendum Note (Signed)
Addended byLivingston Diones: Anie Juniel on: 01/07/2018 11:46 AM   Modules accepted: Orders

## 2018-01-07 NOTE — Telephone Encounter (Signed)
Spoke with the pt, he was wondering if we have 50 XL instead of 100 XL. If there is no 50 XL then he will try the 100 XL, [facility]. Please advise.

## 2018-01-07 NOTE — Telephone Encounter (Signed)
Call pharmacy and this is what they have :   Bupropion XL 150 mg and 300 mg.  Bupropion 100 mg (regular)  Bupropion hydrochloride 75 mg.   Please advise.

## 2018-01-07 NOTE — Telephone Encounter (Signed)
The extended release dose not come in 350 mg that I am aware of.  We could add 100 mg XL to his 300 mg daily.  That would be the maximum dose I would feel comfortable with before sending to psychiatry.  Please let me know if that is what he would like to try. Thank you.

## 2018-01-07 NOTE — Telephone Encounter (Signed)
Yeah that's what I thought.  Please let pt know that we could add one of those options.

## 2018-01-07 NOTE — Telephone Encounter (Signed)
rx of Bupropion XL 150 mg sent in for pt. Ok per Dr. Dayton MartesAron and pt is aware.

## 2018-01-07 NOTE — Telephone Encounter (Signed)
I am unaware of a 50 mg XL dosage.  Can you call the pharmacy to see if it comes in that dose?

## 2018-01-09 ENCOUNTER — Encounter: Payer: Self-pay | Admitting: Internal Medicine

## 2018-01-09 DIAGNOSIS — E113292 Type 2 diabetes mellitus with mild nonproliferative diabetic retinopathy without macular edema, left eye: Secondary | ICD-10-CM | POA: Diagnosis not present

## 2018-01-09 LAB — HM DIABETES EYE EXAM

## 2018-01-10 ENCOUNTER — Other Ambulatory Visit: Payer: Self-pay | Admitting: Internal Medicine

## 2018-01-26 ENCOUNTER — Other Ambulatory Visit: Payer: Self-pay | Admitting: Family Medicine

## 2018-02-19 ENCOUNTER — Ambulatory Visit: Payer: 59 | Admitting: Family Medicine

## 2018-02-19 ENCOUNTER — Encounter: Payer: Self-pay | Admitting: Family Medicine

## 2018-02-19 DIAGNOSIS — Z79891 Long term (current) use of opiate analgesic: Secondary | ICD-10-CM | POA: Insufficient documentation

## 2018-02-19 NOTE — Progress Notes (Signed)
Subjective:   Patient ID: Martin Downs, male    DOB: 02-08-1966, 52 y.o.   MRN: [id number]  Martin Downs is a pleasant 52 y.o. year old male who presents to clinic today with EKG due to being on Methadone (Patient is here today as he is needing an EKG.  State requirement is that with him being on Methadone he must have an EKG done yearly.)  on 02/19/2018  HPI:  On methadone- [facility] for [facility]otic depedence. Was told that he needs a yearly EKG while taking this.  Current Outpatient Medications on File Prior to Visit  Medication Sig Dispense Refill  . aspirin 81 MG tablet Take 81 mg by mouth daily.      . BD INSULIN SYRINGE U/F 31G X 5/16" 0.5 ML MISC USE 3 TIMES DAILY 100 each 0  . Blood Glucose Monitoring Suppl (ONE TOUCH ULTRA MINI) W/DEVICE KIT Use as directed to test blood sugar twice daily 250.00 1 each 0  . buPROPion (WELLBUTRIN XL) 150 MG 24 hr tablet Take 1 tablet (150 mg total) by mouth daily. 30 tablet 2  . buPROPion (WELLBUTRIN XL) 300 MG 24 hr tablet Take 1 tablet (300 mg total) by mouth daily. 30 tablet 3  . fenofibrate 160 MG tablet Take 1 tablet (160 mg total) by mouth daily. 30 tablet 5  . fluticasone (FLONASE) 50 MCG/ACT nasal spray SPRAY 2 SPRAYS INTO EACH NOSTRIL EVERY DAY 16 g 5  . Insulin Degludec (TRESIBA FLEXTOUCH) 200 UNIT/ML SOPN Inject 60 Units into the skin daily. 9 pen 3  . Insulin Pen Needle 32G X 4 MM MISC Use to inject insulin 1 time daily as instructed. 200 each 2  . Insulin Syringe-Needle U-100 (B-D INSULIN SYRINGE 1CC/25GX1") 25G X 1" 1 ML MISC Use to inject testosterone weekly 100 each 5  . JARDIANCE 25 MG TABS tablet TAKE 1 TABLET BY MOUTH EVERY DAY 30 tablet 2  . metFORMIN (GLUCOPHAGE) 1000 MG tablet TAKE 1 TABLET (1,000 MG TOTAL) BY MOUTH 2 (TWO) TIMES DAILY WITH A MEAL. 180 tablet 0  . methadone (DOLOPHINE) 10 MG/ML solution Take 180 mg by mouth daily.     . Multiple Vitamin (MULTIVITAMIN) tablet Take 2 tablets by mouth 2  (two) times daily.    . Multiple Vitamins-Minerals (MEGA MULTI MEN PO) Take by mouth.    . ONE TOUCH ULTRA TEST test strip USE 8 TIMES A DAY AS INSTRUCTED 300 each 4  . ONETOUCH DELICA LANCETS 77L MISC USE TO TEST BLOOD SUGAR 4 TIMES DAILY AS INSTRUCTED. 200 each 3  . OZEMPIC 1 MG/DOSE SOPN INJECT 1 MG INTO THE SKIN ONCE A WEEK. 3 pen 2  . PARoxetine (PAXIL) 30 MG tablet TAKE 1 TABLET BY MOUTH EVERY DAY 30 tablet 5  . rosuvastatin (CRESTOR) 10 MG tablet TAKE 1 TABLET BY MOUTH EVERY DAY 90 tablet 1  . sildenafil (VIAGRA) 100 MG tablet Take 0.5-1 tablets (50-100 mg total) by mouth daily as needed for erectile dysfunction. 5 tablet 11  . Syringe/Needle, Disp, (SYRINGE 3CC/18GX1-1/2") 18G X 1-1/2" 3 ML MISC Use to draw up testosterone 100 each 0  . Syringe/Needle, Disp, 18G X 1" 1 ML MISC Use to draw up testosterone 100 each 5  . testosterone cypionate (DEPOTESTOSTERONE CYPIONATE) 200 MG/ML injection INJECT 0.25 MILLILITERS INTO THE MUSCLE ONCE A WEEK 10 mL 0   No current facility-administered medications on file prior to visit.     Allergies  Allergen Reactions  . Gemfibrozil  Diarrhea    Past Medical History:  Diagnosis Date  . Anxiety   . Borderline diabetes   . Chronic pain   . Depression   . Diabetes mellitus without complication ([place])   . Hyperlipidemia   . Narcotic dependence ([place])   . Sleep apnea    no cpap    Past Surgical History:  Procedure Laterality Date  . CHOLECYSTECTOMY N/A 05/30/2016   Procedure: LAPAROSCOPIC CHOLECYSTECTOMY WITH INTRAOPERATIVE CHOLANGIOGRAM;  Surgeon: Donnie Mesa, MD;  Location: [facility];  Service: General;  Laterality: N/A;  . CHOLECYSTECTOMY N/A 05/30/2016   Procedure: OPEN COMMON BILE DUCT EXPLORATION; INSERTION OF T-TUBE;  Surgeon: Donnie Mesa, MD;  Location: [facility];  Service: General;  Laterality: N/A;  . removal of bone spur Bilateral 1995   elbps  . TOOTH EXTRACTION  2017  . UMBILICAL HERNIA REPAIR N/A 05/30/2016   Procedure: HERNIA REPAIR  UMBILICAL ADULT;  Surgeon: Donnie Mesa, MD;  Location: [facility];  Service: General;  Laterality: N/A;    Family History  Problem Relation Age of Onset  . Heart disease Father   . Cancer Father   . Colon cancer Neg Hx     Social History   Socioeconomic History  . Marital status: Married    Spouse name: Not on file  . Number of children: Y  . Years of education: Not on file  . Highest education level: Not on file  Occupational History  . Occupation: Music therapist: UNEMPLOYED  Social Needs  . Financial resource strain: Not on file  . Food insecurity:    Worry: Not on file    Inability: Not on file  . Transportation needs:    Medical: Not on file    Non-medical: Not on file  Tobacco Use  . Smoking status: Never Smoker  . Smokeless tobacco: Current User    Types: Snuff  Substance and Sexual Activity  . Alcohol use: No  . Drug use: No  . Sexual activity: Not on file  Lifestyle  . Physical activity:    Days per week: Not on file    Minutes per session: Not on file  . Stress: Not on file  Relationships  . Social connections:    Talks on phone: Not on file    Gets together: Not on file    Attends religious service: Not on file    Active member of club or organization: Not on file    Attends meetings of clubs or organizations: Not on file    Relationship status: Not on file  . Intimate partner violence:    Fear of current or ex partner: Not on file    Emotionally abused: Not on file    Physically abused: Not on file    Forced sexual activity: Not on file  Other Topics Concern  . Not on file  Social History Narrative  . Not on file   The PMH, PSH, Social History, Family History, Medications, and allergies have been reviewed in [facility], and have been updated if relevant.   Review of Systems  Constitutional: Negative.   Respiratory: Negative.   Cardiovascular: Negative.   Hematological: Negative.   Psychiatric/Behavioral: Negative.   All other systems  reviewed and are negative.      Objective:    BP 130/74 (BP Location: Left Arm, Patient Position: Sitting, Cuff Size: Normal)   Pulse 94   Temp 98.9 F (37.2 C) (Oral)   Ht '5\' 9"'$  (1.753 m)   Wt 206  lb 3.2 oz (93.5 kg)   SpO2 98%   BMI 30.45 kg/m    Physical Exam  Constitutional: He is oriented to person, place, and time. He appears well-developed and well-nourished. No distress.  HENT:  Head: Normocephalic and atraumatic.  Eyes: EOM are normal.  Neck: Normal range of motion.  Cardiovascular: Normal rate and regular rhythm.  Pulmonary/Chest: Effort normal and breath sounds normal.  Musculoskeletal: Normal range of motion.  Neurological: He is alert and oriented to person, place, and time. No cranial nerve deficit.  Skin: Skin is warm and dry. He is not diaphoretic.  Psychiatric: He has a normal mood and affect. His behavior is normal. Judgment and thought content normal.  Nursing note and vitals reviewed.         Assessment & Plan:   Encounter for long-term methadone use - Plan: EKG 12-Lead No follow-ups on file.

## 2018-02-19 NOTE — Assessment & Plan Note (Addendum)
Followed by [facility]. EKG done today per request of his psychiatrist. Reassuring- NSR- given copy of EKG to pt to give to his psychiatrist.

## 2018-02-25 ENCOUNTER — Ambulatory Visit
Admission: RE | Admit: 2018-02-25 | Discharge: 2018-02-25 | Disposition: A | Discharge status: Home / Self Care | Payer: No Typology Code available for payment source | Source: Ambulatory Visit | Attending: Nurse Practitioner | Admitting: Nurse Practitioner

## 2018-02-25 ENCOUNTER — Other Ambulatory Visit: Payer: Self-pay | Admitting: Nurse Practitioner

## 2018-02-25 DIAGNOSIS — T1490XA Injury, unspecified, initial encounter: Secondary | ICD-10-CM

## 2018-03-17 ENCOUNTER — Other Ambulatory Visit: Payer: Self-pay | Admitting: Internal Medicine

## 2018-03-17 ENCOUNTER — Other Ambulatory Visit: Payer: Self-pay | Admitting: Family Medicine

## 2018-03-20 ENCOUNTER — Other Ambulatory Visit: Payer: Self-pay | Admitting: Internal Medicine

## 2018-03-21 NOTE — Telephone Encounter (Signed)
Is this okay to refill? 

## 2018-03-21 NOTE — Telephone Encounter (Signed)
ok 

## 2018-03-31 ENCOUNTER — Other Ambulatory Visit: Payer: Self-pay | Admitting: Internal Medicine

## 2018-04-03 ENCOUNTER — Other Ambulatory Visit: Payer: Self-pay | Admitting: Family Medicine

## 2018-04-14 ENCOUNTER — Telehealth: Payer: Self-pay | Admitting: Family Medicine

## 2018-04-14 DIAGNOSIS — G4700 Insomnia, unspecified: Secondary | ICD-10-CM

## 2018-04-14 NOTE — Telephone Encounter (Signed)
Ok to enter referral. Associate referral with insomnia and hypersomnia

## 2018-04-14 NOTE — Telephone Encounter (Signed)
Yes okay to refer

## 2018-04-14 NOTE — Telephone Encounter (Signed)
Ok to refer.

## 2018-04-14 NOTE — Telephone Encounter (Signed)
Copied from CRM [phone]#[id number]. Topic: Referral - Request >> Apr 14, 2018  2:27 PM Ronney LionArrington, Shykila A wrote: Reason for CRM: Patient called in wanting a referral for a sleep study.  Patient says preferably he would like to see Dr. Vickey Hugerohmeier at [facility].   Phone number for Facility [phone]  No fax info given at this time.

## 2018-04-15 NOTE — Telephone Encounter (Signed)
Referral entered as requested for Dr Zipporah Plantsoheimer

## 2018-04-18 ENCOUNTER — Encounter: Payer: Self-pay | Admitting: Family Medicine

## 2018-04-18 ENCOUNTER — Ambulatory Visit: Payer: 59 | Admitting: Family Medicine

## 2018-04-18 DIAGNOSIS — L03019 Cellulitis of unspecified finger: Secondary | ICD-10-CM | POA: Insufficient documentation

## 2018-04-18 DIAGNOSIS — L03012 Cellulitis of left finger: Secondary | ICD-10-CM | POA: Diagnosis not present

## 2018-04-18 MED ORDER — MUPIROCIN 2 % EX OINT: 1.0000 "application " | TOPICAL_OINTMENT | Freq: Two times a day (BID) | CUTANEOUS | 0 refills | Status: DC

## 2018-04-18 MED ORDER — DOXYCYCLINE HYCLATE 100 MG PO TABS: 100.0000 mg | ORAL_TABLET | Freq: Two times a day (BID) | ORAL | 0 refills | Status: DC

## 2018-04-18 NOTE — Patient Instructions (Signed)
Take doxycycline twice per day Apply mupirocin 2-3 times per day to areas as well.  Return for recheck in 1 week.

## 2018-04-18 NOTE — Progress Notes (Signed)
Martin Downs - 52 y.o. male MRN [id number]  Date of birth: 12/26/1965  Subjective Chief Complaint  Patient presents with  . Left thumb infected    HPI Martin ChyleJerry Thomas Downs is a 52 y.o. male with history of T2DM here today with complaint of thumb pain.  Symptoms began 2 days ago.  Has had some swelling and drainage from thumb.  Using rubbing alcohol and peroxide on area.  Also noticed area on ring finger of same hand, mild swelling without drainge.  He recently had an abscess on his face as well which his wife "popped" at home, this is resolving.  He denies numbness/tingling of the thumb, fever, chills, red streaking.   ROS:  A comprehensive ROS was completed and negative except as noted per HPI  Allergies  Allergen Reactions  . Gemfibrozil Diarrhea    Past Medical History:  Diagnosis Date  . Anxiety   . Borderline diabetes   . Chronic pain   . Depression   . Diabetes mellitus without complication (HCC)   . Hyperlipidemia   . Narcotic dependence (HCC)   . Sleep apnea    no cpap    Past Surgical History:  Procedure Laterality Date  . CHOLECYSTECTOMY N/A 05/30/2016   Procedure: LAPAROSCOPIC CHOLECYSTECTOMY WITH INTRAOPERATIVE CHOLANGIOGRAM;  Surgeon: Manus RuddMatthew Tsuei, MD;  Location: [facility] OR;  Service: General;  Laterality: N/A;  . CHOLECYSTECTOMY N/A 05/30/2016   Procedure: OPEN COMMON BILE DUCT EXPLORATION; INSERTION OF T-TUBE;  Surgeon: Manus RuddMatthew Tsuei, MD;  Location: [facility] OR;  Service: General;  Laterality: N/A;  . removal of bone spur Bilateral 1995   elbps  . TOOTH EXTRACTION  2017  . UMBILICAL HERNIA REPAIR N/A 05/30/2016   Procedure: HERNIA REPAIR UMBILICAL ADULT;  Surgeon: Manus RuddMatthew Tsuei, MD;  Location: [facility] OR;  Service: General;  Laterality: N/A;    Social History   Socioeconomic History  . Marital status: Married    Spouse name: Not on file  . Number of children: Y  . Years of education: Not on file  . Highest education level: Not on file  Occupational History  .  Occupation: Sports administratormechanic    Employer: UNEMPLOYED  Social Needs  . Financial resource strain: Not on file  . Food insecurity:    Worry: Not on file    Inability: Not on file  . Transportation needs:    Medical: Not on file    Non-medical: Not on file  Tobacco Use  . Smoking status: Never Smoker  . Smokeless tobacco: Current User    Types: Snuff  Substance and Sexual Activity  . Alcohol use: No  . Drug use: No  . Sexual activity: Not on file  Lifestyle  . Physical activity:    Days per week: Not on file    Minutes per session: Not on file  . Stress: Not on file  Relationships  . Social connections:    Talks on phone: Not on file    Gets together: Not on file    Attends religious service: Not on file    Active member of club or organization: Not on file    Attends meetings of clubs or organizations: Not on file    Relationship status: Not on file  Other Topics Concern  . Not on file  Social History Narrative  . Not on file    Family History  Problem Relation Age of Onset  . Heart disease Father   . Cancer Father   . Colon cancer Neg Hx  Health Maintenance  Topic Date Due  . PNEUMOCOCCAL POLYSACCHARIDE VACCINE AGE 89-64 HIGH RISK  05/02/1966  . HIV Screening  05/03/1979  . FOOT EXAM  03/13/2017  . INFLUENZA VACCINE  02/27/2018  . HEMOGLOBIN A1C  05/13/2018  . URINE MICROALBUMIN  11/12/2018  . OPHTHALMOLOGY EXAM  01/10/2019  . COLONOSCOPY  05/25/2026  . TETANUS/TDAP  11/12/2027  . Hepatitis C Screening  Completed    ----------------------------------------------------------------------------------------------------------------------------------------------------------------------------------------------------------------- Physical Exam BP 120/62   Pulse 83   Temp 98.1 F (36.7 C) (Oral)   Ht 5\' 9"  (1.753 m)   Wt 212 lb (96.2 kg)   SpO2 95%   BMI 31.31 kg/m   Physical Exam  Constitutional: He is oriented to person, place, and time. He appears  well-nourished. No distress.  HENT:  Head: Normocephalic and atraumatic.  Mouth/Throat: Oropharynx is clear and moist.  Eyes: No scleral icterus.  Cardiovascular: Normal rate, regular rhythm and normal heart sounds.  Pulmonary/Chest: Effort normal and breath sounds normal.  Neurological: He is alert and oriented to person, place, and time.  Skin: Skin is warm and dry.  Mild swelling with erythema surrounding nail fold of L thumb.  Yellow crusting present.  Similar area on 4th digit as well on same hand.   Psychiatric: He has a normal mood and affect. His behavior is normal.    ------------------------------------------------------------------------------------------------------------------------------------------------------------------------------------------------------------------- Assessment and Plan  Paronychia of thumb Paronychia with surrounding cellulitis  Rx for doxycycline + mupirocin ointment Follow up in 1 week to recheck.

## 2018-04-18 NOTE — Assessment & Plan Note (Signed)
Paronychia with surrounding cellulitis  Rx for doxycycline + mupirocin ointment Follow up in 1 week to recheck.

## 2018-04-22 ENCOUNTER — Ambulatory Visit: Payer: 59 | Admitting: Internal Medicine

## 2018-04-22 NOTE — Progress Notes (Deleted)
Patient ID: Martin Downs, male   DOB: July 20, 1966, 52 y.o.   MRN: [id number]  HPI: Martin Downs is a 52 y.o.-year-old male, returning for f/u for DM2 dx 2014, insulin-dependent since 02/2014, uncontrolled, with complications (mild CKD) and hypogonadotropic hypogonadism. Last visit 4 months ago.  DM2: Last hemoglobin A1c was: Lab Results  Component Value Date   HGBA1C 8.7 11/11/2017   HGBA1C 8.1 04/15/2017   HGBA1C 9.1 10/02/2016   He now takes: - Basaglar 40 units at bedtime - restarted 09/2016 - Trulicity 1.5 mg weekly - restarted 09/2016 Ozempic 1 mg weekly - changed 07/2017 - Metformin 1000 mg 2x daily - Farxiga 10 mg daily in am >>  Jardiance 25 mg daily We stopped Actoplus 15-850 mg in 02/2014. We stopped Glipizide when we increased Novolog. Insurance does not cover Invokana. We tried to add Actos 15 mg daily (added back 06/2016) >> stopped   Pt checks sugars 2-3 times a day, nonfasting, as he is almost never on an empty stomach: - am:  200-250 >> 212-243 >> 230-250 - 2h after b'fast:114 >> n/c >>  239, 247 >> n/c - lunch: 150-200 >> n/c >> 215 >> 230 - 2h after lunch: 210-252, 300 >> n/c >> 230-280 - dinner:  200-250 >> 211, 231 >> n/c - 2h after dinner:  241-379 >> n/c >> 219, 253 >> 220-280 - bedtime:200-220 >> n/c >> 240-284 >> n/c  - nighttime: 130-218 >> n/c >> 239 >> n/c  Higher: 300s.  Meter: One Touch Ultra mini  Pt's meals are -he grazes throughout the day: - Breakfast: oatmeal or yoghurt or fruit bar/cup (largest meal) - Lunch: PB sandwich - Dinner: chicken leftovers - Snacks: sandwich, 1 pack of crackers, diet Mountain dew  -  + Mild CKD, last BUN/creatinine:  Lab Results  Component Value Date   BUN 13 12/11/2017   CREATININE 1.26 12/11/2017  Not on an ACE inhibitor. -+ HL; last set of lipids: Lab Results  Component Value Date   CHOL 104 04/30/2017   HDL 32 (L) 04/30/2017   LDLCALC 31 04/30/2017   LDLDIRECT 136.0 11/01/2014   TRIG 204  (H) 04/30/2017   CHOLHDL 3.3 04/30/2017  On Crestor and fenofibrate. - last eye exam was on 12/2016: No DR - no numbness and tingling in his feet.  Hypogonadotropic hypogonadism: - He was on Androgel  in 2015 >> his testosterone levels did not increase and he did not feel better  >> now on IM testosterone 50 mg weekly  Reviewed his testosterone levels going back to 2016 - most recent level was normal:   Component     Latest Ref Rng & Units 04/30/2017 12/11/2017  Testosterone     264 - 916 ng/dL 2,952 (H) 841  Testosterone Free     7.2 - 24.0 pg/mL 28.6 (H) 14.6  Sex Horm Binding Glob, Serum     19.3 - 76.4 nmol/L 33.0 39.5   Component     Latest Ref Rng & Units 01/16/2016  Testosterone     250 - 827 ng/dL 3,244 (H)  Albumin     3.6 - 5.1 g/dL 4.4  Sex Horm Binding Glob, Serum     10 - 50 nmol/L 28  Testosterone Free     47.0 - 244.0 pg/mL 185.8  Testosterone, Bioavailable     130.5 - 681.7 ng/dL 010.2   Component     Latest Ref Rng & Units 07/18/2015 10/18/2015 10/18/2015  7:52 AM  7:52 AM  Testosterone     250 - 827 ng/dL 409 (L) 811 (H) 914 (H)  Albumin     3.6 - 5.1 g/dL   4.5  Sex Horm Binding Glob, Serum     10 - 50 nmol/L 34 25 25  Testosterone Free     47.0 - 244.0 pg/mL 23.9 (L) 259.1 (H) 186.3  Testosterone, Bioavailable     130.5 - 681.7 ng/dL   782.9  Testosterone-% Free     1.6 - 2.9 % 1.8 2.7    PSA levels -stable, normal Component     Latest Ref Rng & Units 04/30/2017  Prostate Specific Ag, Serum     0.0 - 4.0 ng/mL 0.7   Lab Results  Component Value Date   PSA 0.69 03/01/2016   PSA 0.91 10/18/2015   PSA 0.37 01/17/2010   PSA 0.55 11/07/2009   Hb/HT normal at last check: Lab Results  Component Value Date   WBC 10.4 11/11/2017   HGB 16.2 11/11/2017   HCT 48.8 11/11/2017   MCV 94.3 11/11/2017   PLT 196.0 11/11/2017   - We checked his pituitary function in 2016 >> labs normal except inappropriately normal LH, FSH, and also low  testosterone.  Prolactin was higher but monomeric prolactin was normal: Component     Latest Ref Rng 05/16/2015  Testosterone     300 - 890 ng/dL 562 (L)  Sex Hormone Binding     10 - 50 nmol/L 28  Testosterone Free     47.0 - 244.0 pg/mL 26.9 (L)  Testosterone-% Free     1.6 - 2.9 % 2.0  IGF-I, LC/MS     50 - 317 ng/mL 99  Z-Score (Male)     -2.0-+2.0 SD -0.7  Prolactin, Total     2.0 - 18.0 ng/mL 35.4 (H)  Prolactin, Monomeric     3.4 - 14.8 ng/mL 11.4  LH     1.50 - 9.30 mIU/mL 2.03  FSH     1.4 - 18.1 mIU/ML 5.9  Cortisol, Plasma      14.2  C206 ACTH     6 - 50 pg/mL 32   Previous labs (drawn at 8:33 AM)   Prolactin 38.6 (2.1-17.1)  FSH 5.6, LH 2.0  Total testosterone 134 (300-890), bioavailable testosterone 35.5 ng/dL (130.8-657.8), free testosterone 18 pg/mL (47-244), SHBG 29 (10-50)  PSA 0.16  Estradiol 17.1 (0-39)   reviewed the report of pituitary MRI 07/14/2015 >> no pituitary lesions.  Hyperprolactinemia: -Found during investigation for low testosterone, by Dr. Mena Goes, his urologist. -Turned out to be caused by macroprolactin which does not require further investigation  No gynecomastia, galactorrhea, or breast tenderness  Patient's alkaline phosphatase was low in 04/2017 (25) so we started a multivitamin.  He also had an elevated TSH in the past, but recent level was reviewed and this was normal: Lab Results  Component Value Date   TSH 2.79 11/11/2017   Before last visit, he started Wellbutrin and trazodone.  ROS: Constitutional: no weight gain/no weight loss, no fatigue, no subjective hyperthermia, no subjective hypothermia Eyes: no blurry vision, no xerophthalmia ENT: no sore throat, no nodules palpated in throat, no dysphagia, no odynophagia, no hoarseness Cardiovascular: no CP/no SOB/no palpitations/no leg swelling Respiratory: no cough/no SOB/no wheezing Gastrointestinal: no N/no V/no D/no C/no acid reflux Musculoskeletal: no muscle  aches/no joint aches Skin: no rashes, no hair loss Neurological: no tremors/no numbness/no tingling/no dizziness  I reviewed pt's medications, allergies, PMH, social  hx, family hx, and changes were documented in the history of present illness. Otherwise, unchanged from my initial visit note.  PE: There were no vitals taken for this visit. There is no height or weight on file to calculate BMI.  Wt Readings from Last 3 Encounters:  04/18/18 212 lb (96.2 kg)  02/19/18 206 lb 3.2 oz (93.5 kg)  12/18/17 207 lb 6.4 oz (94.1 kg)   Constitutional: overweight, in NAD Eyes: PERRLA, EOMI, no exophthalmos ENT: moist mucous membranes, no thyromegaly, no cervical lymphadenopathy Cardiovascular: RRR, No MRG Respiratory: CTA B Gastrointestinal: abdomen soft, NT, ND, BS+ Musculoskeletal: no deformities, strength intact in all 4 Skin: moist, warm, no rashes Neurological: no tremor with outstretched hands, DTR normal in all 4  ASSESSMENT: 1. DM2, insulin-dependent, uncontrolled, with complications - mild ckd  - We discussed in the past about a VGO system or another type of pump >> but he works outside and sweats a lot >> he does not think he can wear this - We have discussed about a regular insulin pump in the past, but he cannot use any of the attached devices due to the fact that he has hyperhidrosis. - We did discuss about the possibility to refer him to another endocrinologist, possibly at [facility], for a second opinion, but he refuses.   2. Hypogonadotropic hypogonadism  3.  Hyperprolactinemia  4. Elevated TSH  PLAN:  1. Complex patient with uncontrolled diabetes, with increased insulin resistance.  He has an interesting pattern of CBGs that are not significantly affected by any of the changes in his medication regimen that we tried in the past.  He usually works a lot, 20 hours a day reportedly sleeps very little, and eats incessantly (grazing).  He was off insulin in the past but we had to add  back Basaglar few months before last visit.  The HbA1c did improved to 8.1% afterwards.  At last visit, since his sugars were still in the 200s, we  stopped Trulicity and started a high dose of Ozempic, 1 mg weekly. Recent HbA1c level was higher, at 8.7%. - He lost 7 pounds since last visit, possibly the effect of starting Ozempic - however, sugars have not improved and we discussed at this visit to change from Basaglar to [place]iba U200 and increase the dose by 50%.  We will continue metformin, Jardiance, and Ozempic - I again advised him to try to limit how much he works, since he is still working almost 20 hours a day and sleeping very little. I think this can also contribute to the severity of his diabetes  and the fact that he is not responding to many of the treatments that we are trying.  He tells me that he has to children in school (college, masters) and cannot afford to work less for now  - I suggested to:  Patient Instructions  Please continue: - Tresiba U200 60 units at bedtime.  - Metformin 1000 mg 2x daily - Jardiance 25 mg in a.m. - Ozempic 1 mg weekly  Continue: -Testosterone injection 50 mg weekly  Please return in 4 months with your sugar log.   - today, HbA1c is 7%  - continue checking sugars at different times of the day - check 1-2x a day, rotating checks - advised for yearly eye exams >> he is UTD - Return to clinic in 4 mo with sugar log   2. Hypogonadotropic Hypogonadism -Normal pituitary MRI and the rest of the pituitary tests were also normal -Continues  on testosterone intramuscular injections 50 mg weekly.  On this dose, his most recent testosterone level was normal in 11/2017.  He is feeling good and does not have any complaints today.  He continues to have some low libido, but no breast tension or galactorrhea. -His PSA levels were stable, normal, and he has annual DRE's with his urologist.  He does have prostate hypertrophy.  Latest PSA available to me is from a  year ago. -CBC levels were stable normal 11/2017  3.  Hyperprolactinemia  This is due to macro prolactin, so he does not need further investigation  4. Elevated TSH Resolved at last check.  He needs all labs to go through [facility].  Carlus Pavlov, MD PhD [facility] Endocrinology

## 2018-06-04 DIAGNOSIS — E291 Testicular hypofunction: Secondary | ICD-10-CM | POA: Diagnosis not present

## 2018-06-09 ENCOUNTER — Other Ambulatory Visit: Payer: Self-pay | Admitting: Family Medicine

## 2018-06-10 ENCOUNTER — Other Ambulatory Visit: Payer: Self-pay | Admitting: Internal Medicine

## 2018-06-23 ENCOUNTER — Encounter: Payer: Self-pay | Admitting: Neurology

## 2018-06-23 ENCOUNTER — Ambulatory Visit: Payer: 59 | Admitting: Neurology

## 2018-06-23 VITALS — BP 142/78 | HR 80 | Ht 69.0 in | Wt 205.0 lb

## 2018-06-23 DIAGNOSIS — G4730 Sleep apnea, unspecified: Secondary | ICD-10-CM | POA: Insufficient documentation

## 2018-06-23 DIAGNOSIS — G4709 Other insomnia: Secondary | ICD-10-CM | POA: Diagnosis not present

## 2018-06-23 DIAGNOSIS — R61 Generalized hyperhidrosis: Secondary | ICD-10-CM | POA: Insufficient documentation

## 2018-06-23 DIAGNOSIS — F329 Major depressive disorder, single episode, unspecified: Secondary | ICD-10-CM

## 2018-06-23 DIAGNOSIS — G4710 Hypersomnia, unspecified: Secondary | ICD-10-CM | POA: Insufficient documentation

## 2018-06-23 DIAGNOSIS — Z79891 Long term (current) use of opiate analgesic: Secondary | ICD-10-CM

## 2018-06-23 DIAGNOSIS — Z72821 Inadequate sleep hygiene: Secondary | ICD-10-CM

## 2018-06-23 DIAGNOSIS — R002 Palpitations: Secondary | ICD-10-CM | POA: Diagnosis not present

## 2018-06-23 NOTE — Patient Instructions (Signed)
Please remember to try to maintain good sleep hygiene, which means: Keep a regular sleep and wake schedule, try not to exercise or have a meal within 2 hours of your bedtime, try to keep your bedroom conducive for sleep, that is, cool and dark, without light distractors such as an illuminated alarm clock, and refrain from watching TV right before sleep or in the middle of the night and do not keep the TV or radio on during the night. Also, try not to use or play on electronic devices at bedtime, such as your cell phone, tablet PC or laptop. If you like to read at bedtime on an electronic device, try to dim the background light as much as possible. Do not eat in the middle of the night.   We will request a sleep study.    We will look for leg twitching and snoring or sleep apnea.   For chronic insomnia, you are best followed by a psychiatrist and/or sleep psychologist.   We will call you with the sleep study results and make a follow up appointment if needed.   Insomnia Insomnia is a sleep disorder that makes it difficult to fall asleep or to stay asleep. Insomnia can cause tiredness (fatigue), low energy, difficulty concentrating, mood swings, and poor performance at work or school. There are three different ways to classify insomnia:  Difficulty falling asleep.  Difficulty staying asleep.  Waking up too early in the morning.  Any type of insomnia can be long-term (chronic) or short-term (acute). Both are common. Short-term insomnia usually lasts for three months or less. Chronic insomnia occurs at least three times a week for longer than three months. What are the causes? Insomnia may be caused by another condition, situation, or substance, such as:  Anxiety.  Certain medicines.  Gastroesophageal reflux disease (GERD) or other gastrointestinal conditions.  Asthma or other breathing conditions.  Restless legs syndrome, sleep apnea, or other sleep disorders.  Chronic  pain.  Menopause. This may include hot flashes.  Stroke.  Abuse of alcohol, tobacco, or illegal drugs.  Depression.  Caffeine.  Neurological disorders, such as Alzheimer disease.  An overactive thyroid (hyperthyroidism).  The cause of insomnia may not be known. What increases the risk? Risk factors for insomnia include:  Gender. Women are more commonly affected than men.  Age. Insomnia is more common as you get older.  Stress. This may involve your professional or personal life.  Income. Insomnia is more common in people with lower income.  Lack of exercise.  Irregular work schedule or night shifts.  Traveling between different time zones.  What are the signs or symptoms? If you have insomnia, trouble falling asleep or trouble staying asleep is the main symptom. This may lead to other symptoms, such as:  Feeling fatigued.  Feeling nervous about going to sleep.  Not feeling rested in the morning.  Having trouble concentrating.  Feeling irritable, anxious, or depressed.  How is this treated? Treatment for insomnia depends on the cause. If your insomnia is caused by an underlying condition, treatment will focus on addressing the condition. Treatment may also include:  Medicines to help you sleep.  Counseling or therapy.  Lifestyle adjustments.  Follow these instructions at home:  Take medicines only as directed by your health care provider.  Keep regular sleeping and waking hours. Avoid naps.  Keep a sleep diary to help you and your health care provider figure out what could be causing your insomnia. Include: ? When you sleep. ?   When you wake up during the night. ? How well you sleep. ? How rested you feel the next day. ? Any side effects of medicines you are taking. ? What you eat and drink.  Make your bedroom a comfortable place where it is easy to fall asleep: ? Put up shades or special blackout curtains to block light from outside. ? Use a  white noise machine to block noise. ? Keep the temperature cool.  Exercise regularly as directed by your health care provider. Avoid exercising right before bedtime.  Use relaxation techniques to manage stress. Ask your health care provider to suggest some techniques that may work well for you. These may include: ? Breathing exercises. ? Routines to release muscle tension. ? Visualizing peaceful scenes.  Cut back on alcohol, caffeinated beverages, and cigarettes, especially close to bedtime. These can disrupt your sleep.  Do not overeat or eat spicy foods right before bedtime. This can lead to digestive discomfort that can make it hard for you to sleep.  Limit screen use before bedtime. This includes: ? Watching TV. ? Using your smartphone, tablet, and computer.  Stick to a routine. This can help you fall asleep faster. Try to do a quiet activity, brush your teeth, and go to bed at the same time each night.  Get out of bed if you are still awake after 15 minutes of trying to sleep. Keep the lights down, but try reading or doing a quiet activity. When you feel sleepy, go back to bed.  Make sure that you drive carefully. Avoid driving if you feel very sleepy.  Keep all follow-up appointments as directed by your health care provider. This is important. Contact a health care provider if:  You are tired throughout the day or have trouble in your daily routine due to sleepiness.  You continue to have sleep problems or your sleep problems get worse. Get help right away if:  You have serious thoughts about hurting yourself or someone else. This information is not intended to replace advice given to you by your health care provider. Make sure you discuss any questions you have with your health care provider. Document Released: 07/13/2000 Document Revised: 12/16/2015 Document Reviewed: 04/16/2014 Else[employer] Interactive Patient Education  2018 [employer]  

## 2018-06-23 NOTE — Progress Notes (Signed)
SLEEP MEDICINE CLINIC   Provider:  Larey Seat, MD   Primary Care Physician:  Lucille Passy, MD   Referring Provider: Lucille Passy, MD    Chief Complaint  Patient presents with  . New Patient (Initial Visit)    pt alone, rm 10. pt states that he has had a difficult time with sleeping for about 10 years. pt states about 9 yrs ago he attempted a sleep study and was unable to tolerate the study and the test was inconclusive. pt has been told he snores in sleep. wakes up sweating. pt states that he nods off frequently if he gets to stop and states that he doesnt have a hard time going to sleep just a hard time staying asleep.    HPI:  Martin Downs is a 52 y.o. male patient is seen on 06-23-2018  in a referral from Dr. Deborra Medina for insomnia and hypersomnia, in the setting of anxiety and opiate addiction.    Chief complaint according to patient : I have the pleasure of meeting Martin Downs today a 52 year old Caucasian gentleman with difficulties to sleep for over a decade.  He has been told that he snores he does wake up with palpitations or diaphoresis, often in daytime he and is not even sure how long he may have been sleeping.  It is not difficult for him at night to go to sleep but is staying asleep.  He also states that he has some other comorbidities that may affect his sleep.  Currently he may be staying asleep for 90 minutes before he wakes up again he is awake for several hours before he may take another 90 minutes of sleep and he feels severely sleep deprived.   Sleep habits are as follows: His workday ends at 3:30 PM, and he is usually at home by 4 PM, dinnertime is between 6 and 7 PM, and his bedtime is 11 PM.  He goes to bed at the same time that his spouse does, he has noted that going earlier just shortens his nighttime sleep further.  He does not get more hours by going to bed earlier.  The bedroom is cool, quiet and dark without TV.  He prefers to sleep on his right  side, and uses only one pillow for head and neck support. He cannot recall dreaming, he has no nocturia.  He wakes up diaphoretic - he seems to sweat more on the right side of the face and body.He rises at 5.45 AM.  On weekends he doesn't really stop sleeping, has a complete different rhythm, naps 90 minutes over the day, again and again.      Sleep medical history: The patient has a history of diabetes treated with Glucophage, Jardiance and insulin.  He is taking Flonase for nasal allergies, bupropion XL form and 150 mg XL form for a total of 450 mg daily is taken in the morning.  Also taking Ozempic, he is on Paxil 30 mg, Crestor, Viagra and testosterone injections.   Social history:  Married, with 88 and 7 year old children.  He works for [employer], [employer].  Non smoker, He is using tobacco in form of snuff 1/2 can daily. ETOH- on weekends, never daily, "no addiction"- but reports binging in the past. Caffeine - none.    Review of Systems: Out of a complete 14 system review, the patient complains of only the following symptoms, and all other reviewed systems are negative.  Review of systems was endorsed to fatigue, snoring, constipation, erectile dysfunction, feeling hot, sweating at night increased thirst,, depression and anxiety, he feels that he does not get enough sleep has a decreased level of energy a change in appetite and disinterest in activities but denies suicidal thoughts, hallucinations, racing thoughts.  He again endorsed high cholesterol diabetes depression and anxiety as well as a cholecystectomy from October 2017.  There has been a strong paternal family history of coronary artery disease.  He is using tobacco in form of snuff 1/2 can daily  Epworth Sleepiness score  22/ 24 points  , Fatigue severity score at 54/ 63 points - very high.   , depression score n/a  He is on Wellbutrin, Paxil .   He discontinued a sleep aid recently, cannot recall the name.      Social History   Socioeconomic History  . Marital status: Married    Spouse name: Not on file  . Number of children: Y  . Years of education: Not on file  . Highest education level: Not on file  Occupational History  . Occupation: Music therapist: UNEMPLOYED  Social Needs  . Financial resource strain: Not on file  . Food insecurity:    Worry: Not on file    Inability: Not on file  . Transportation needs:    Medical: Not on file    Non-medical: Not on file  Tobacco Use  . Smoking status: Never Smoker  . Smokeless tobacco: Current User    Types: Snuff  Substance and Sexual Activity  . Alcohol use: No    Comment: quir 2009  . Drug use: Not Currently  . Sexual activity: Not on file  Lifestyle  . Physical activity:    Days per week: Not on file    Minutes per session: Not on file  . Stress: Not on file  Relationships  . Social connections:    Talks on phone: Not on file    Gets together: Not on file    Attends religious service: Not on file    Active member of club or organization: Not on file    Attends meetings of clubs or organizations: Not on file    Relationship status: Not on file  . Intimate partner violence:    Fear of current or ex partner: Not on file    Emotionally abused: Not on file    Physically abused: Not on file    Forced sexual activity: Not on file  Other Topics Concern  . Not on file  Social History Narrative  . Not on file    Family History  Problem Relation Age of Onset  . Heart disease Father   . Cancer Father   . Colon cancer Neg Hx     Past Medical History:  Diagnosis Date  . Anxiety   . Borderline diabetes   . Chronic pain   . Depression   . Diabetes mellitus without complication ([place])   . Hyperlipidemia   . Narcotic dependence ([place])   . Sleep apnea    no cpap    Past Surgical History:  Procedure Laterality Date  . CHOLECYSTECTOMY N/A 05/30/2016   Procedure: LAPAROSCOPIC CHOLECYSTECTOMY WITH INTRAOPERATIVE  CHOLANGIOGRAM;  Surgeon: Donnie Mesa, MD;  Location: [facility];  Service: General;  Laterality: N/A;  . CHOLECYSTECTOMY N/A 05/30/2016   Procedure: OPEN COMMON BILE DUCT EXPLORATION; INSERTION OF T-TUBE;  Surgeon: Donnie Mesa, MD;  Location: [facility];  Service: General;  Laterality: N/A;  .  removal of bone spur Bilateral 1995   elbps  . TOOTH EXTRACTION  2017  . UMBILICAL HERNIA REPAIR N/A 05/30/2016   Procedure: HERNIA REPAIR UMBILICAL ADULT;  Surgeon: Donnie Mesa, MD;  Location: [facility];  Service: General;  Laterality: N/A;    Current Outpatient Medications  Medication Sig Dispense Refill  . aspirin 81 MG tablet Take 81 mg by mouth daily.      . BD INSULIN SYRINGE U/F 31G X 5/16" 0.5 ML MISC USE 3 TIMES DAILY 100 each 0  . Blood Glucose Monitoring Suppl (ONE TOUCH ULTRA MINI) W/DEVICE KIT Use as directed to test blood sugar twice daily 250.00 1 each 0  . buPROPion (WELLBUTRIN XL) 150 MG 24 hr tablet Take 1qd (in addition to 377m to equal 4579mtotal) 30 tablet 2  . buPROPion (WELLBUTRIN XL) 300 MG 24 hr tablet TAKE 1 TABLET BY MOUTH EVERY DAY 30 tablet 3  . doxycycline (VIBRA-TABS) 100 MG tablet Take 1 tablet (100 mg total) by mouth 2 (two) times daily. 20 tablet 0  . fenofibrate 160 MG tablet Take 1 tablet (160 mg total) by mouth daily. 30 tablet 5  . fluticasone (FLONASE) 50 MCG/ACT nasal spray SPRAY 2 SPRAYS INTO EACH NOSTRIL EVERY DAY 16 g 5  . Insulin Degludec (TRESIBA FLEXTOUCH) 200 UNIT/ML SOPN Inject 60 Units into the skin daily. 9 pen 3  . Insulin Pen Needle 32G X 4 MM MISC Use to inject insulin 1 time daily as instructed. 200 each 2  . Insulin Syringe-Needle U-100 (B-D INSULIN SYRINGE 1CC/25GX1") 25G X 1" 1 ML MISC Use to inject testosterone weekly 100 each 5  . JARDIANCE 25 MG TABS tablet TAKE 1 TABLET BY MOUTH EVERY DAY 30 tablet 2  . metFORMIN (GLUCOPHAGE) 1000 MG tablet TAKE 1 TABLET (1,000 MG TOTAL) BY MOUTH 2 (TWO) TIMES DAILY WITH A MEAL. 180 tablet 0  . methadone (DOLOPHINE)  10 MG/ML solution Take 180 mg by mouth daily.     . Multiple Vitamin (MULTIVITAMIN) tablet Take 2 tablets by mouth 2 (two) times daily.    . Multiple Vitamins-Minerals (MEGA MULTI MEN PO) Take by mouth.    . mupirocin ointment (BACTROBAN) 2 % Place 1 application into the nose 2 (two) times daily. 22 g 0  . ONE TOUCH ULTRA TEST test strip USE 8 TIMES A DAY AS INSTRUCTED 300 each 4  . ONETOUCH DELICA LANCETS 3320NISC USE TO TEST BLOOD SUGAR 4 TIMES DAILY AS INSTRUCTED. 200 each 3  . OZEMPIC 1 MG/DOSE SOPN INJECT 1 MG INTO THE SKIN ONCE A WEEK. 3 pen 2  . PARoxetine (PAXIL) 30 MG tablet TAKE 1 TABLET BY MOUTH EVERY DAY 30 tablet 5  . rosuvastatin (CRESTOR) 10 MG tablet TAKE 1 TABLET BY MOUTH EVERY DAY 90 tablet 1  . sildenafil (VIAGRA) 100 MG tablet Take 0.5-1 tablets (50-100 mg total) by mouth daily as needed for erectile dysfunction. 5 tablet 11  . SYRINGE-NEEDLE, DISP, 3 ML (B-D 3CC LUER-LOK SYR 18GX1-1/2) 18G X 1-1/2" 3 ML MISC USE TO DRAW UP TESTOSTERONE 1 each 1  . Syringe/Needle, Disp, 18G X 1" 1 ML MISC Use to draw up testosterone 100 each 5  . testosterone cypionate (DEPOTESTOSTERONE CYPIONATE) 200 MG/ML injection INJECT 0.25MLS INTO THE MUSCLE ONCE WEEKLY 1 mL 1   No current facility-administered medications for this visit.     Allergies as of 06/23/2018 - Review Complete 06/23/2018  Allergen Reaction Noted  . Gemfibrozil Diarrhea     Vitals:  BP (!) 142/78   Pulse 80   Ht _0  (1.753 m)   Wt 205 lb (93 kg)   BMI 30.27 kg/m  Last Weight:  Wt Readings from Last 1 Encounters:  06/23/18 205 lb (93 kg)   DSK:AJGO mass index is 30.27 kg/m.     Last Height:   Ht Readings from Last 1 Encounters:  06/23/18 _1  (1.753 m)    Physical exam:  General: The patient is awake, alert and appears not in acute distress. The patient is groomed. Highly anxious.  Head: Normocephalic, atraumatic. Neck is short and thick. Mallampati 5 ,  neck circumference:18.75 ". Nasal airflow patent  , Retrognathia is seen.  Poor dental status, had dental work in 2013 , ended up addicted to pain medication.  Cardiovascular:  Regular rate and rhythm , without  murmurs or carotid bruit, and without distended neck veins. Respiratory: Lungs are clear to auscultation. Skin:  Without evidence of edema, or rash Trunk: BMI is 30.27 . The patient's posture is erect.   Neurologic exam : The patient is awake and alert, oriented to place and time.  Attention span & concentration ability appears normal.  Speech is fluent,  Without dysarthria, dysphonia or aphasia. Mood is pleasant, but he is jittery.  Cranial nerves:Pupils are equal and briskly reactive to light. Funduscopic exam without evidence of pallor or edema. Extraocular movements  in vertical and horizontal planes intact and without nystagmus. Visual fields by finger perimetry are intact.  The right eyelid is slightly lower than the left and touches the pupil, mild ptosis.  Otherwise facial symmetry. Hearing to finger rub intact.  Facial sensation intact to fine touch. Facial motor strength is symmetric and tongue and uvula move midline. Shoulder shrug was symmetrical.  Motor exam: Normal tone, muscle bulk and symmetric strength in all extremities. Sensory: Fine touch, pinprick and vibration were tested in all extremities. Proprioception tested in the upper extremities was normal. Coordination: Rapid alternating movements in the fingers/hands was normal. Finger-to-nose maneuver normal without evidence of ataxia, dysmetria or tremor. Gait and station: Patient walks without assistive device and is able unassisted to climb up to the exam table. Strength within normal limits. Stance is stable and normal. Turns with 3 Steps.  Deep tendon reflexes: in the upper and lower extremities are symmetric and intact.     Assessment:  After physical and neurologic examination, review of laboratory studies,  Personal review of imaging studies, reports of other  /same  Imaging studies, results of polysomnography and / or neurophysiology testing and pre-existing records as far as provided in visit., my assessment is:   1) Mr. Vasudevan has several risk factors for the presence of obstructive sleep apnea but also for central sleep apnea.  His BMI is elevated he does have a large neck circumference and a smaller narrow upper airway.  He has been witnessed to snore which is associated with obstructive sleep apnea. 2)He reports nasal obstruction at night when he is lays down he is forced to breathe through his mouth because his nasal passage seems to collapse. 3) his risk factor for central sleep apnea lies in his medications, especially the methadone does have a potential to create central sleep apnea.  In addition he has comorbidities such as diabetes on multiple medications, and some depression and anxiety.  These will affect the sleep quality the medications were also affect his sleep architecture.  He reports that he cannot recall dreaming which is well possible given that he does  not sleep in long intervals and that he is on several medications that would suppress rapid eye movement or dream sleep.  My goal would be for the patient to undergo a screening test at this time because I am concerned that he could again not sleep in the sleep lab due to comfort issues and being intolerant of electrodes and tubing close to his face.  I will order a home sleep test to screen for apnea and based on that test we will then go and see if we have to worry about central sleep apnea in that case an attended sleep study would be necessary, if obstructive sleep apnea is only identified we can try to treat him by auto titration.   The patient was advised of the nature of the diagnosed disorder , the treatment options and the  risks for general health and wellness arising from not treating the condition.   I spent more than 50 minutes of face to face time with the patient.  Greater  than 50% of time was spent in counseling and coordination of care. We have discussed the diagnosis and differential and I answered the patient's questions.    Plan:  Treatment plan and additional workup :  Patient has claustrophobia, gasping for air , snorng ,  sleep myoclonus and diaphoresis, this can all be related to opiate / methadone addiction and treatment with this substance. He denies night mares.   CYCLIC and Chronic insomnia needs to be addressed with behavioral health.  He has deplorable sleep habits . I gave him the 14 days insomnia boot camp.  My role is to identify organic sleep disorders.  An in lab study failed before, and I will try to gather as many data as possible by watch pat.     Larey Seat, MD 18/29/9371, 6:96 PM  Certified in Neurology by ABPN Certified in [place] by [facility] [address], [place], [place] [address]

## 2018-07-05 ENCOUNTER — Other Ambulatory Visit: Payer: Self-pay | Admitting: Internal Medicine

## 2018-07-05 ENCOUNTER — Other Ambulatory Visit: Payer: Self-pay | Admitting: Family Medicine

## 2018-07-08 ENCOUNTER — Other Ambulatory Visit: Payer: Self-pay | Admitting: Family Medicine

## 2018-08-08 ENCOUNTER — Other Ambulatory Visit: Payer: Self-pay | Admitting: Internal Medicine

## 2018-08-12 ENCOUNTER — Other Ambulatory Visit: Payer: Self-pay | Admitting: Family Medicine

## 2018-08-13 NOTE — Telephone Encounter (Signed)
Patient calling back to check status of this refill. Patient states he was advised by pharmacy that they had sent request over a week ago. Please advise, patient states he is out of this medication.

## 2018-08-18 ENCOUNTER — Other Ambulatory Visit: Payer: Self-pay | Admitting: Family Medicine

## 2018-08-18 DIAGNOSIS — E291 Testicular hypofunction: Secondary | ICD-10-CM | POA: Diagnosis not present

## 2018-08-22 ENCOUNTER — Other Ambulatory Visit: Payer: Self-pay

## 2018-08-27 ENCOUNTER — Ambulatory Visit (INDEPENDENT_AMBULATORY_CARE_PROVIDER_SITE_OTHER): Payer: 59 | Admitting: Neurology

## 2018-08-27 DIAGNOSIS — G4733 Obstructive sleep apnea (adult) (pediatric): Secondary | ICD-10-CM

## 2018-08-27 DIAGNOSIS — R002 Palpitations: Secondary | ICD-10-CM

## 2018-08-27 DIAGNOSIS — R61 Generalized hyperhidrosis: Secondary | ICD-10-CM

## 2018-08-27 DIAGNOSIS — Z72821 Inadequate sleep hygiene: Secondary | ICD-10-CM

## 2018-08-27 DIAGNOSIS — G4730 Sleep apnea, unspecified: Secondary | ICD-10-CM

## 2018-08-27 DIAGNOSIS — G4731 Primary central sleep apnea: Secondary | ICD-10-CM

## 2018-08-27 DIAGNOSIS — Z79891 Long term (current) use of opiate analgesic: Secondary | ICD-10-CM

## 2018-09-03 DIAGNOSIS — G4731 Primary central sleep apnea: Secondary | ICD-10-CM | POA: Insufficient documentation

## 2018-09-03 NOTE — Addendum Note (Signed)
Addended by: Melvyn Novas on: 09/03/2018 12:53 PM   Modules accepted: Orders

## 2018-09-03 NOTE — Procedures (Signed)
NAME:  Martin Downs                                                                              DOB: 30-Mar-1966 MEDICAL RECORD No: [id number]                                                        DOS: 08/27/2018  REFERRING PHYSICIAN: Ruthe Mannan, MD STUDY PERFORMED: Home Sleep Test on Watch Pat HISTORY: [name] is a 53 y.o. male patient, seen on 06-23-2018  in a referral from Dr. Dayton Martes for insomnia and hypersomnia, in the setting of anxiety and opiate addiction. He has comorbidities that may affect his sleep. Chief complaint according to patient: A 53 year old Caucasian gentleman with difficulties to sleep for well over a decade.  He has been told that he snores, he does wake up with palpitations and/ or diaphoresis. Falls asleep often (in daytime) and is not even sure how long he may have been sleeping.  It is not difficult for him at night to go to sleep but to stay asleep.   Currently he may be staying asleep for 90 minutes en bloc before he wakes up again, then is awake for several hours before he may take another 90 minutes of sleep -and he feels severely sleep deprived. Epworth Sleepiness score endorsed at 22/ 24 points, Fatigue severity score at 54/ 63 points - BMI: 30.27 kg/m2. Medication reviewed.   STUDY RESULTS:  Total Recording Time: 7 h, 11 mins; Calculated Valid Sleep Time: 4 h 55 mins Total Apnea/Hypopnea Index (AHI):  69.0 /h; RDI: 69.0 /h; estimated REM proportion less than 5 %.  Average Oxygen Saturation:   94 %; Lowest Oxygen Desaturation:  68%  Total Time in Oxygen Saturation below 89 %: 7.2 minutes  Average Heart Rate: 76 bpm (between 37 and 100 bpm) IMPRESSION: Severe Sleep apnea, with central apneas. NO valid REM sleep data obtained.   Intermittent Bradycardia by HST remains still unexplained- is there a cardiac arrhythmia?  Clinical report of loud snoring also not reflected in RDI.  RECOMMENDATION: Attended sleep study to confirm the unusual constellation of  bradycardia, suppressed REM sleep, with severe and complex sleep apnea ( central component from opiate therapy?). Heart palpitations were clinically reported, but not seen in this HST.  I certify that I have reviewed the raw data recording prior to the issuance of this report in accordance with the standards of the American Academy of Sleep Medicine (AASM). Melvyn Novas, M.D.  09-03-2018    Medical Director of [facility], accredited by the AASM. Diplomat of the ABPN and ABSM.

## 2018-09-04 ENCOUNTER — Telehealth: Payer: Self-pay | Admitting: Neurology

## 2018-09-04 ENCOUNTER — Encounter: Payer: Self-pay | Admitting: Neurology

## 2018-09-04 DIAGNOSIS — R509 Fever, unspecified: Secondary | ICD-10-CM | POA: Diagnosis not present

## 2018-09-04 DIAGNOSIS — J209 Acute bronchitis, unspecified: Secondary | ICD-10-CM | POA: Diagnosis not present

## 2018-09-04 NOTE — Telephone Encounter (Signed)
-----   Message from Melvyn Novas, MD sent at 09/03/2018 12:53 PM EST ----- I did not recieve a sleep journal when this HST was returned, and can only guess if there were many prolonged periods of wakefulness. IMPRESSION: Severe Sleep apnea, with central apneas. NO valid REM  sleep data obtained.  Intermittent Bradycardia by HST remains still unexplained- is  there a cardiac arrhythmia? Clinical report of loud snoring also  not reflected in RDI.  RECOMMENDATION: Attended sleep study to confirm the unusual  constellation of bradycardia, suppressed REM sleep, with severe  and complex sleep apnea ( central component from opiate  therapy?). Heart palpitations were clinically reported, but not  seen in this HST.

## 2018-09-04 NOTE — Telephone Encounter (Signed)
Called the patient to review the sleep study results. No answer and was unable to leave a message to do voicemail not set up. Sending a my chart message to pt as well and will try again later.

## 2018-09-09 ENCOUNTER — Encounter: Payer: Self-pay | Admitting: Neurology

## 2018-09-11 ENCOUNTER — Encounter: Payer: Self-pay | Admitting: Family Medicine

## 2018-09-11 ENCOUNTER — Ambulatory Visit: Payer: 59 | Admitting: Family Medicine

## 2018-09-11 ENCOUNTER — Ambulatory Visit (INDEPENDENT_AMBULATORY_CARE_PROVIDER_SITE_OTHER): Payer: 59

## 2018-09-11 VITALS — BP 140/70 | HR 92 | Temp 98.8°F | Ht 69.0 in | Wt 192.4 lb

## 2018-09-11 DIAGNOSIS — R9389 Abnormal findings on diagnostic imaging of other specified body structures: Secondary | ICD-10-CM | POA: Diagnosis not present

## 2018-09-11 DIAGNOSIS — J0110 Acute frontal sinusitis, unspecified: Secondary | ICD-10-CM | POA: Insufficient documentation

## 2018-09-11 DIAGNOSIS — J40 Bronchitis, not specified as acute or chronic: Secondary | ICD-10-CM

## 2018-09-11 DIAGNOSIS — J984 Other disorders of lung: Secondary | ICD-10-CM | POA: Diagnosis not present

## 2018-09-11 MED ORDER — FLUTICASONE PROPIONATE 50 MCG/ACT NA SUSP: NASAL | 5 refills | Status: DC

## 2018-09-11 MED ORDER — DOXYCYCLINE HYCLATE 100 MG PO TABS: 100.0000 mg | ORAL_TABLET | Freq: Two times a day (BID) | ORAL | 0 refills | Status: DC

## 2018-09-11 NOTE — Patient Instructions (Signed)

## 2018-09-11 NOTE — Progress Notes (Signed)
Established Patient Office Visit  Subjective:  Patient ID: Martin Downs, male    DOB: 1966-06-21  Age: 53 y.o. MRN: [id number]  CC:  Chief Complaint  Patient presents with  . Generalized Body Aches    HPI Martin Downs presents for a 2-week history of URI symptoms that now include frontal sinus pressure and pain, intermittent fevers and a nonproductive cough.  Currently patient denies rhinorrhea postnasal drip, reactive airway disease, wheezing, sputum.  He does complain of malaise and fatigue.  He has no asthma history and did quit smoking.  He was seen at urgent care 1 week ago he prescribed a 7-day course of Augmentin prednisone and Bromfed pd.   Past Medical History:  Diagnosis Date  . Anxiety   . Borderline diabetes   . Chronic pain   . Depression   . Diabetes mellitus without complication ([place])   . Hyperlipidemia   . Narcotic dependence ([place])   . Sleep apnea    no cpap    Past Surgical History:  Procedure Laterality Date  . CHOLECYSTECTOMY N/A 05/30/2016   Procedure: LAPAROSCOPIC CHOLECYSTECTOMY WITH INTRAOPERATIVE CHOLANGIOGRAM;  Surgeon: Donnie Mesa, MD;  Location: [facility];  Service: General;  Laterality: N/A;  . CHOLECYSTECTOMY N/A 05/30/2016   Procedure: OPEN COMMON BILE DUCT EXPLORATION; INSERTION OF T-TUBE;  Surgeon: Donnie Mesa, MD;  Location: [facility];  Service: General;  Laterality: N/A;  . removal of bone spur Bilateral 1995   elbps  . TOOTH EXTRACTION  2017  . UMBILICAL HERNIA REPAIR N/A 05/30/2016   Procedure: HERNIA REPAIR UMBILICAL ADULT;  Surgeon: Donnie Mesa, MD;  Location: [facility];  Service: General;  Laterality: N/A;    Family History  Problem Relation Age of Onset  . Heart disease Father   . Cancer Father   . Colon cancer Neg Hx     Social History   Socioeconomic History  . Marital status: Married    Spouse name: Not on file  . Number of children: Y  . Years of education: Not on file  . Highest education level: Not on file    Occupational History  . Occupation: Music therapist: UNEMPLOYED  Social Needs  . Financial resource strain: Not on file  . Food insecurity:    Worry: Not on file    Inability: Not on file  . Transportation needs:    Medical: Not on file    Non-medical: Not on file  Tobacco Use  . Smoking status: Never Smoker  . Smokeless tobacco: Current User    Types: Snuff  Substance and Sexual Activity  . Alcohol use: No    Comment: quir 2009  . Drug use: Not Currently  . Sexual activity: Not on file  Lifestyle  . Physical activity:    Days per week: Not on file    Minutes per session: Not on file  . Stress: Not on file  Relationships  . Social connections:    Talks on phone: Not on file    Gets together: Not on file    Attends religious service: Not on file    Active member of club or organization: Not on file    Attends meetings of clubs or organizations: Not on file    Relationship status: Not on file  . Intimate partner violence:    Fear of current or ex partner: Not on file    Emotionally abused: Not on file    Physically abused: Not on file  Forced sexual activity: Not on file  Other Topics Concern  . Not on file  Social History Narrative  . Not on file    Outpatient Medications Prior to Visit  Medication Sig Dispense Refill  . aspirin 81 MG tablet Take 81 mg by mouth daily.      . BD INSULIN SYRINGE U/F 31G X 5/16" 0.5 ML MISC USE 3 TIMES DAILY 100 each 0  . Blood Glucose Monitoring Suppl (ONE TOUCH ULTRA MINI) W/DEVICE KIT Use as directed to test blood sugar twice daily 250.00 1 each 0  . buPROPion (WELLBUTRIN XL) 150 MG 24 hr tablet TAKE 1 TABLET BY MOUTH EVERY DAY WITH '300MG'$  TO EQUAL '450MG'$  30 tablet 2  . buPROPion (WELLBUTRIN XL) 300 MG 24 hr tablet TAKE 1 TABLET BY MOUTH EVERY DAY 30 tablet 3  . fenofibrate 160 MG tablet TAKE 1 TABLET BY MOUTH EVERY DAY 30 tablet 5  . Insulin Degludec (TRESIBA FLEXTOUCH) 200 UNIT/ML SOPN Inject 60 Units into the skin daily. 9  pen 3  . Insulin Pen Needle 32G X 4 MM MISC Use to inject insulin 1 time daily as instructed. 200 each 2  . Insulin Syringe-Needle U-100 25G X 1" 1 ML MISC USE TO INJECT TESTOSTERONE WEEKLY 4 each PRN  . JARDIANCE 25 MG TABS tablet TAKE 1 TABLET BY MOUTH EVERY DAY 30 tablet 2  . metFORMIN (GLUCOPHAGE) 1000 MG tablet TAKE 1 TABLET (1,000 MG TOTAL) BY MOUTH 2 (TWO) TIMES DAILY WITH A MEAL. 60 tablet 2  . methadone (DOLOPHINE) 10 MG/ML solution Take 180 mg by mouth daily.     . Multiple Vitamin (MULTIVITAMIN) tablet Take 2 tablets by mouth 2 (two) times daily.    . Multiple Vitamins-Minerals (MEGA MULTI MEN PO) Take by mouth.    . mupirocin ointment (BACTROBAN) 2 % Place 1 application into the nose 2 (two) times daily. 22 g 0  . ONE TOUCH ULTRA TEST test strip USE 8 TIMES A DAY AS INSTRUCTED 300 each 4  . ONETOUCH DELICA LANCETS 16X MISC USE TO TEST BLOOD SUGAR 4 TIMES DAILY AS INSTRUCTED. 200 each 3  . OZEMPIC 1 MG/DOSE SOPN INJECT 1 MG INTO THE SKIN ONCE A WEEK. 3 pen 2  . PARoxetine (PAXIL) 30 MG tablet TAKE 1 TABLET BY MOUTH EVERY DAY 30 tablet 5  . rosuvastatin (CRESTOR) 10 MG tablet TAKE 1 TABLET BY MOUTH EVERY DAY 30 tablet 2  . sildenafil (VIAGRA) 100 MG tablet Take 0.5-1 tablets (50-100 mg total) by mouth daily as needed for erectile dysfunction. 5 tablet 11  . Syringe/Needle, Disp, (SYRINGE 3CC/18GX1-1/2") 18G X 1-1/2" 3 ML MISC USE TO DRAW UP TESTOSTERONE 4 each 24  . Syringe/Needle, Disp, 18G X 1" 1 ML MISC Use to draw up testosterone 100 each 5  . testosterone cypionate (DEPOTESTOSTERONE CYPIONATE) 200 MG/ML injection INJECT 0.25MLS INTO THE MUSCLE ONCE WEEKLY 1 mL 1  . zolpidem (AMBIEN) 10 MG tablet Take 10 mg by mouth at bedtime as needed for sleep.    . fluticasone (FLONASE) 50 MCG/ACT nasal spray SPRAY 2 SPRAYS INTO EACH NOSTRIL EVERY DAY 16 g 5  . doxycycline (VIBRA-TABS) 100 MG tablet Take 1 tablet (100 mg total) by mouth 2 (two) times daily. 20 tablet 0   No  facility-administered medications prior to visit.     Allergies  Allergen Reactions  . Gemfibrozil Diarrhea    ROS Review of Systems  Constitutional: Positive for chills, diaphoresis, fatigue and fever. Negative for unexpected weight  change.  HENT: Positive for congestion, sinus pressure and sinus pain. Negative for postnasal drip, rhinorrhea, sore throat and voice change.   Eyes: Negative for photophobia and visual disturbance.  Respiratory: Positive for cough. Negative for shortness of breath and wheezing.   Cardiovascular: Negative for chest pain and palpitations.  Gastrointestinal: Negative for nausea and vomiting.  Endocrine: Negative for polyphagia and polyuria.  Genitourinary: Negative.   Musculoskeletal: Negative for arthralgias and myalgias.  Skin: Negative for pallor and rash.  Neurological: Positive for light-headedness and headaches.  Hematological: Does not bruise/bleed easily.  Psychiatric/Behavioral: Negative.       Objective:    Physical Exam  Constitutional: He is oriented to person, place, and time. He appears well-developed and well-nourished. No distress.  HENT:  Head: Normocephalic and atraumatic.  Right Ear: External ear normal.  Left Ear: External ear normal.  Mouth/Throat: Oropharynx is clear and moist. No oropharyngeal exudate.  Eyes: Pupils are equal, round, and reactive to light. Conjunctivae are normal. Right eye exhibits no discharge. Left eye exhibits no discharge. No scleral icterus.  Neck: Normal range of motion. Neck supple. No JVD present. No tracheal deviation present. No thyromegaly present.  Cardiovascular: Normal rate, regular rhythm and normal heart sounds.  Pulmonary/Chest: Effort normal and breath sounds normal. No stridor.  Abdominal: Bowel sounds are normal.  Musculoskeletal:     Cervical back: He exhibits normal range of motion and no tenderness.  Lymphadenopathy:    He has no cervical adenopathy.  Neurological: He is alert and  oriented to person, place, and time.  Skin: Skin is warm and dry. No rash noted. He is not diaphoretic. No erythema.  Psychiatric: He has a normal mood and affect. His behavior is normal.    BP 140/70   Pulse 92   Temp 98.8 F (37.1 C) (Oral)   Ht _0  (1.753 m)   Wt 192 lb 6 oz (87.3 kg)   SpO2 93%   BMI 28.41 kg/m  Wt Readings from Last 3 Encounters:  09/11/18 192 lb 6 oz (87.3 kg)  06/23/18 205 lb (93 kg)  04/18/18 212 lb (96.2 kg)   BP Readings from Last 3 Encounters:  09/11/18 140/70  06/23/18 (!) 142/78  04/18/18 120/62   Guideline developer:  UpToDate (see UpToDate for funding source) Date Released: June 2014  Health Maintenance Due  Topic Date Due  . PNEUMOCOCCAL POLYSACCHARIDE VACCINE AGE 97-64 HIGH RISK  05/02/1966  . HIV Screening  05/03/1979  . FOOT EXAM  03/13/2017  . INFLUENZA VACCINE  02/27/2018  . HEMOGLOBIN A1C  05/13/2018    There are no preventive care reminders to display for this patient.  Lab Results  Component Value Date   TSH 2.79 11/11/2017   Lab Results  Component Value Date   WBC 10.4 11/11/2017   HGB 16.2 11/11/2017   HCT 48.8 11/11/2017   MCV 94.3 11/11/2017   PLT 196.0 11/11/2017   Lab Results  Component Value Date   NA 139 12/11/2017   K 4.6 12/11/2017   CO2 31 12/11/2017   GLUCOSE 159 (H) 12/11/2017   BUN 13 12/11/2017   CREATININE 1.26 12/11/2017   BILITOT 0.8 12/11/2017   ALKPHOS 55 06/08/2016   AST 27 12/11/2017   ALT 37 12/11/2017   PROT 6.5 12/11/2017   ALBUMIN 3.0 (L) 06/08/2016   CALCIUM 9.5 12/11/2017   ANIONGAP 8 06/08/2016   GFR 68.94 03/01/2016   Lab Results  Component Value Date   CHOL 104 04/30/2017  Lab Results  Component Value Date   HDL 32 (L) 04/30/2017   Lab Results  Component Value Date   LDLCALC 31 04/30/2017   Lab Results  Component Value Date   TRIG 204 (H) 04/30/2017   Lab Results  Component Value Date   CHOLHDL 3.3 04/30/2017   Lab Results  Component Value Date   HGBA1C  8.7 11/11/2017      Assessment & Plan:   Problem List Items Addressed This Visit      Respiratory   Acute frontal sinusitis - Primary   Relevant Medications   doxycycline (VIBRA-TABS) 100 MG tablet   fluticasone (FLONASE) 50 MCG/ACT nasal spray   Bronchitis   Relevant Orders   DG Chest 2 View      Meds ordered this encounter  Medications  . doxycycline (VIBRA-TABS) 100 MG tablet    Sig: Take 1 tablet (100 mg total) by mouth 2 (two) times daily.    Dispense:  20 tablet    Refill:  0  . fluticasone (FLONASE) 50 MCG/ACT nasal spray    Sig: SPRAY 2 SPRAYS INTO EACH NOSTRIL EVERY DAY    Dispense:  16 g    Refill:  5    Follow-up: Return in about 1 week (around 09/18/2018), or if symptoms worsen or fail to improve.   Had considered using fluoroquinolone but could not do so secondary to concerns of QT prolongation of the Dolophine.  10 days of doxycycline and patient will start Flonase.  We will follow-up in 1 week if not improving.  Information was given on sinusitis.

## 2018-09-12 NOTE — Addendum Note (Signed)
Addended by: Nadene Rubins A on: 09/12/2018 08:00 AM   Modules accepted: Orders

## 2018-09-19 ENCOUNTER — Ambulatory Visit: Payer: 59

## 2018-09-23 ENCOUNTER — Ambulatory Visit (HOSPITAL_COMMUNITY)
Admission: RE | Admit: 2018-09-23 | Discharge: 2018-09-23 | Disposition: A | Discharge status: Home / Self Care | Payer: 59 | Source: Ambulatory Visit | Attending: Family Medicine | Admitting: Family Medicine

## 2018-09-23 DIAGNOSIS — R9389 Abnormal findings on diagnostic imaging of other specified body structures: Secondary | ICD-10-CM | POA: Insufficient documentation

## 2018-09-23 DIAGNOSIS — J40 Bronchitis, not specified as acute or chronic: Secondary | ICD-10-CM

## 2018-09-23 DIAGNOSIS — J069 Acute upper respiratory infection, unspecified: Secondary | ICD-10-CM | POA: Diagnosis not present

## 2018-09-24 ENCOUNTER — Telehealth: Payer: Self-pay | Admitting: Family Medicine

## 2018-09-24 NOTE — Telephone Encounter (Signed)
Jerrel Ivory, PRA with [facility] called report of CT chest, he says the call is about impression 1 and 2 on the report, report verified in Epic, read back and verified. Advised this will be sent to Dr. Doreene Burke. Called the office and spoke [place], [place].

## 2018-09-24 NOTE — Telephone Encounter (Signed)
Please review CT scan and advise

## 2018-09-25 ENCOUNTER — Encounter: Payer: Self-pay | Admitting: Internal Medicine

## 2018-09-25 ENCOUNTER — Ambulatory Visit (INDEPENDENT_AMBULATORY_CARE_PROVIDER_SITE_OTHER): Payer: 59 | Admitting: Internal Medicine

## 2018-09-25 VITALS — BP 142/80 | HR 87 | Ht 69.0 in | Wt 200.0 lb

## 2018-09-25 DIAGNOSIS — E1121 Type 2 diabetes mellitus with diabetic nephropathy: Secondary | ICD-10-CM | POA: Diagnosis not present

## 2018-09-25 DIAGNOSIS — E230 Hypopituitarism: Secondary | ICD-10-CM

## 2018-09-25 DIAGNOSIS — E1165 Type 2 diabetes mellitus with hyperglycemia: Secondary | ICD-10-CM

## 2018-09-25 DIAGNOSIS — E039 Hypothyroidism, unspecified: Secondary | ICD-10-CM | POA: Diagnosis not present

## 2018-09-25 DIAGNOSIS — IMO0002 Reserved for concepts with insufficient information to code with codable children: Secondary | ICD-10-CM

## 2018-09-25 LAB — POCT GLYCOSYLATED HEMOGLOBIN (HGB A1C): HEMOGLOBIN A1C: 8.3 % — AB (ref 4.0–5.6)

## 2018-09-25 MED ORDER — INSULIN DEGLUDEC 200 UNIT/ML ~~LOC~~ SOPN: 80.0000 [IU] | PEN_INJECTOR | Freq: Every day | SUBCUTANEOUS | 3 refills | Status: DC

## 2018-09-25 NOTE — Patient Instructions (Addendum)
Please increase: - Tresiba to 80 units daily.   Continue: - Metformin 1000 mg 2x daily - Jardiance 25 mg in a.m. - Ozempic 1 mg weekly  Also, continue: - Testosterone injection 50 mg weekly into the muscle.  Please return in 4 months with your sugar log.

## 2018-09-25 NOTE — Telephone Encounter (Signed)
I called and spoke with patient. He is still feeling a little foggy, but he is feeling better. He will call the office back to schedule an appointment to go over the results. I advised him to schedule an appointment with either Dr. Dayton Martes or you, since I'm not sure how quick he will be able to get in with her.

## 2018-09-25 NOTE — Progress Notes (Signed)
Patient ID: Martin Downs, male   DOB: Jul 07, 1966, 53 y.o.   MRN: [id number]  HPI: Martin Downs is a 53 y.o.-year-old male, returning for f/u for DM2 dx 2014, insulin-dependent since 02/2014, uncontrolled, with complications (mild CKD) and hypogonadotropic hypogonadism. Last visit 9 months ago.  His sugars are much higher in the last 2 months, unexplained.  However, he did feel poorly approximately a month ago when he developed fevers and vomiting.  These have improved, but he still has a headache.  He had a CT scan of the chest yesterday and reviewing the report, this shows multifocal pneumonia.  He will have an appointment with Dr. Ethelene Hal soon.  DM2: Last hemoglobin A1c was: Lab Results  Component Value Date   HGBA1C 8.7 11/11/2017   HGBA1C 8.1 04/15/2017   HGBA1C 9.1 10/02/2016   He is on: - Basaglar 40 >> Tresiba 60 units daily - Metformin 1000 mg 2x daily - Farxiga >> Jardiance 25 mg in a.m - Ozempic 1 mg weekly We stopped Actoplus 15-850 mg in 02/2014. We stopped Glipizide when we increased Novolog. Insurance does not cover Invokana. We tried to add Actos 15 mg daily (added back 06/2016) >> stopped  We tried U500 insulin.  He checks his sugars 2-3 times a day, nonfasting, as he is almost never on empty stomach: - am:  200-250 >> 212-243 >> 230-250 >> 226-465 - 2h after b'fast: n/c >>  239, 247 >> n/c - lunch: 150-200 >> n/c >> 215 >> 230 - 2h after lunch: 210-252, 300 >> n/c >> 230-280 >> ? - dinner:  200-250 >> 211, 231 >> n/c >> 327-475 - 2h after dinner:  219, 253 >> 220-280 >> 310-425 - bedtime:200-220 >> n/c >> 240-284 >> n/c  - nighttime: 130-218 >> n/c >> 239 >> n/c  Higher: 300s >> 400s.  Meter: One Touch Ultra mini  Pt's meals are -he grazes throughout the day: - Breakfast: oatmeal or yoghurt or fruit bar/cup (largest meal) - Lunch: PB sandwich - Dinner: chicken leftovers - Snacks: sandwich, 1 pack of crackers, diet Mountain dew  -+ Mild CKD, last  BUN/creatinine:  Lab Results  Component Value Date   BUN 13 12/11/2017   CREATININE 1.26 12/11/2017  Not on an ACE inhibitor. -+ HL; last set of lipids: Lab Results  Component Value Date   CHOL 104 04/30/2017   HDL 32 (L) 04/30/2017   LDLCALC 31 04/30/2017   LDLDIRECT 136.0 11/01/2014   TRIG 204 (H) 04/30/2017   CHOLHDL 3.3 04/30/2017  On Crestor and fenofibrate. - last eye exam was on 12/2017: No DR -He denies numbness and tingling in his feet.  Hypogonadotropic hypogonadism: - He was on Androgel  in 2015 >> his testosterone levels did not increase and he did not feel better  >> now he is on IM testosterone 50 mg weekly.  Reviewed his testosterone levels going back to 2016-most recent level was normal:  Component     Latest Ref Rng & Units 04/30/2017 12/11/2017  Testosterone     264 - 916 ng/dL 1,108 (H) 485  Testosterone Free     7.2 - 24.0 pg/mL 28.6 (H) 14.6  Sex Horm Binding Glob, Serum     19.3 - 76.4 nmol/L 33.0 39.5   Component     Latest Ref Rng & Units 01/16/2016  Testosterone     250 - 827 ng/dL 1,007 (H)  Albumin     3.6 - 5.1 g/dL 4.4  Sex Horm Binding Glob,  Serum     10 - 50 nmol/L 28  Testosterone Free     47.0 - 244.0 pg/mL 185.8  Testosterone, Bioavailable     130.5 - 681.7 ng/dL 374.1   Component     Latest Ref Rng & Units 07/18/2015 10/18/2015 10/18/2015          7:52 AM  7:52 AM  Testosterone     250 - 827 ng/dL 131 (L) 949 (H) 949 (H)  Albumin     3.6 - 5.1 g/dL   4.5  Sex Horm Binding Glob, Serum     10 - 50 nmol/L 34 25 25  Testosterone Free     47.0 - 244.0 pg/mL 23.9 (L) 259.1 (H) 186.3  Testosterone, Bioavailable     130.5 - 681.7 ng/dL   383.1  Testosterone-% Free     1.6 - 2.9 % 1.8 2.7    Latest TSH was normal: Component     Latest Ref Rng & Units 04/30/2017  Prostate Specific Ag, Serum     0.0 - 4.0 ng/mL 0.7   Lab Results  Component Value Date   PSA 0.69 03/01/2016   PSA 0.91 10/18/2015   PSA 0.37 01/17/2010   PSA 0.55  11/07/2009   Hemoglobin/hematocrit was normal at last check: Lab Results  Component Value Date   WBC 10.4 11/11/2017   HGB 16.2 11/11/2017   HCT 48.8 11/11/2017   MCV 94.3 11/11/2017   PLT 196.0 11/11/2017   -We checked his pituitary function in 2016 and labs were normal except inappropriately normal LH and FSH, and also low testosterone.  Prolactin was higher but monomeric prolactin was normal: Component     Latest Ref Rng 05/16/2015  Testosterone     300 - 890 ng/dL 132 (L)  Sex Hormone Binding     10 - 50 nmol/L 28  Testosterone Free     47.0 - 244.0 pg/mL 26.9 (L)  Testosterone-% Free     1.6 - 2.9 % 2.0  IGF-I, LC/MS     50 - 317 ng/mL 99  Z-Score (Male)     -2.0-+2.0 SD -0.7  Prolactin, Total     2.0 - 18.0 ng/mL 35.4 (H)  Prolactin, Monomeric     3.4 - 14.8 ng/mL 11.4  LH     1.50 - 9.30 mIU/mL 2.03  FSH     1.4 - 18.1 mIU/ML 5.9  Cortisol, Plasma      14.2  C206 ACTH     6 - 50 pg/mL 32   Previous labs (drawn at 8:33 AM)   Prolactin 38.6 (2.1-17.1)  FSH 5.6, LH 2.0  Total testosterone 134 (300-890), bioavailable testosterone 35.5 ng/dL (130.5-681.7), free testosterone 18 pg/mL (47-244), SHBG 29 (10-50)  PSA 0.16  Estradiol 17.1 (0-39)  Reviewed the report of pituitary MRI 07/14/2015 >> no pituitary lesions  Hyperprolactinemia: -  found during investigation for low testosterone, by Dr. Junious Silk, his urologist. -This turned out to be caused by [place] prolactin, which does not require further investigation.  Denies gynecomastia, galactorrhea, breast tenderness, hot flashes.  Patient's alkaline phosphatase was low in 04/2017 (25) so we started a multivitamin.  He had 1 elevated TSH in the past, but recent levels were normal: Lab Results  Component Value Date   TSH 2.79 11/11/2017   TSH 1.90 03/01/2016   TSH 1.52 05/16/2015   TSH 1.56 02/11/2014   TSH 1.68 05/13/2013   TSH 1.84 03/03/2013   TSH 0.69 12/29/2010   TSH 0.58 04/18/2010  TSH  0.58 01/17/2010   TSH 1.34 07/18/2009    ROS: Constitutional: no weight gain/no weight loss, + fatigue, + subjective hyperthermia, no subjective hypothermia Eyes: no blurry vision, no xerophthalmia ENT: no sore throat, no nodules palpated in neck, no dysphagia, no odynophagia, no hoarseness Cardiovascular: no CP/no SOB/no palpitations/no leg swelling Respiratory: no cough/no SOB/no wheezing Gastrointestinal: no N/no V/no D/+ C/no acid reflux Musculoskeletal: no muscle aches/no joint aches Skin: no rashes, no hair loss Neurological: no tremors/no numbness/no tingling/no dizziness , + Difficulty with erections  I reviewed pt's medications, allergies, PMH, social hx, family hx, and changes were documented in the history of present illness. Otherwise, unchanged from my initial visit note.  Past Medical History:  Diagnosis Date  . Anxiety   . Borderline diabetes   . Chronic pain   . Depression   . Diabetes mellitus without complication ([place])   . Hyperlipidemia   . Narcotic dependence ([place])   . Sleep apnea    no cpap   Past Surgical History:  Procedure Laterality Date  . CHOLECYSTECTOMY N/A 05/30/2016   Procedure: LAPAROSCOPIC CHOLECYSTECTOMY WITH INTRAOPERATIVE CHOLANGIOGRAM;  Surgeon: Donnie Mesa, MD;  Location: [facility];  Service: General;  Laterality: N/A;  . CHOLECYSTECTOMY N/A 05/30/2016   Procedure: OPEN COMMON BILE DUCT EXPLORATION; INSERTION OF T-TUBE;  Surgeon: Donnie Mesa, MD;  Location: [facility];  Service: General;  Laterality: N/A;  . removal of bone spur Bilateral 1995   elbps  . TOOTH EXTRACTION  2017  . UMBILICAL HERNIA REPAIR N/A 05/30/2016   Procedure: HERNIA REPAIR UMBILICAL ADULT;  Surgeon: Donnie Mesa, MD;  Location: [facility];  Service: General;  Laterality: N/A;   Social History   Socioeconomic History  . Marital status: Married    Spouse name: Not on file  . Number of children: Y  . Years of education: Not on file  . Highest education level: Not on file   Occupational History  . Occupation: Music therapist: UNEMPLOYED  Social Needs  . Financial resource strain: Not on file  . Food insecurity:    Worry: Not on file    Inability: Not on file  . Transportation needs:    Medical: Not on file    Non-medical: Not on file  Tobacco Use  . Smoking status: Never Smoker  . Smokeless tobacco: Current User    Types: Snuff  Substance and Sexual Activity  . Alcohol use: No    Comment: quir 2009  . Drug use: Not Currently  . Sexual activity: Not on file  Lifestyle  . Physical activity:    Days per week: Not on file    Minutes per session: Not on file  . Stress: Not on file  Relationships  . Social connections:    Talks on phone: Not on file    Gets together: Not on file    Attends religious service: Not on file    Active member of club or organization: Not on file    Attends meetings of clubs or organizations: Not on file    Relationship status: Not on file  . Intimate partner violence:    Fear of current or ex partner: Not on file    Emotionally abused: Not on file    Physically abused: Not on file    Forced sexual activity: Not on file  Other Topics Concern  . Not on file  Social History Narrative  . Not on file   Current Outpatient Medications on File Prior to  Visit  Medication Sig Dispense Refill  . aspirin 81 MG tablet Take 81 mg by mouth daily.      . BD INSULIN SYRINGE U/F 31G X 5/16" 0.5 ML MISC USE 3 TIMES DAILY 100 each 0  . Blood Glucose Monitoring Suppl (ONE TOUCH ULTRA MINI) W/DEVICE KIT Use as directed to test blood sugar twice daily 250.00 1 each 0  . buPROPion (WELLBUTRIN XL) 150 MG 24 hr tablet TAKE 1 TABLET BY MOUTH EVERY DAY WITH '300MG'$  TO EQUAL '450MG'$  30 tablet 2  . buPROPion (WELLBUTRIN XL) 300 MG 24 hr tablet TAKE 1 TABLET BY MOUTH EVERY DAY 30 tablet 3  . doxycycline (VIBRA-TABS) 100 MG tablet Take 1 tablet (100 mg total) by mouth 2 (two) times daily. 20 tablet 0  . fenofibrate 160 MG tablet TAKE 1 TABLET  BY MOUTH EVERY DAY 30 tablet 5  . fluticasone (FLONASE) 50 MCG/ACT nasal spray SPRAY 2 SPRAYS INTO EACH NOSTRIL EVERY DAY 16 g 5  . Insulin Degludec (TRESIBA FLEXTOUCH) 200 UNIT/ML SOPN Inject 60 Units into the skin daily. 9 pen 3  . Insulin Pen Needle 32G X 4 MM MISC Use to inject insulin 1 time daily as instructed. 200 each 2  . Insulin Syringe-Needle U-100 25G X 1" 1 ML MISC USE TO INJECT TESTOSTERONE WEEKLY 4 each PRN  . JARDIANCE 25 MG TABS tablet TAKE 1 TABLET BY MOUTH EVERY DAY 30 tablet 2  . metFORMIN (GLUCOPHAGE) 1000 MG tablet TAKE 1 TABLET (1,000 MG TOTAL) BY MOUTH 2 (TWO) TIMES DAILY WITH A MEAL. 60 tablet 2  . methadone (DOLOPHINE) 10 MG/ML solution Take 180 mg by mouth daily.     . Multiple Vitamin (MULTIVITAMIN) tablet Take 2 tablets by mouth 2 (two) times daily.    . Multiple Vitamins-Minerals (MEGA MULTI MEN PO) Take by mouth.    . mupirocin ointment (BACTROBAN) 2 % Place 1 application into the nose 2 (two) times daily. 22 g 0  . ONE TOUCH ULTRA TEST test strip USE 8 TIMES A DAY AS INSTRUCTED 300 each 4  . ONETOUCH DELICA LANCETS 14N MISC USE TO TEST BLOOD SUGAR 4 TIMES DAILY AS INSTRUCTED. 200 each 3  . OZEMPIC 1 MG/DOSE SOPN INJECT 1 MG INTO THE SKIN ONCE A WEEK. 3 pen 2  . PARoxetine (PAXIL) 30 MG tablet TAKE 1 TABLET BY MOUTH EVERY DAY 30 tablet 5  . rosuvastatin (CRESTOR) 10 MG tablet TAKE 1 TABLET BY MOUTH EVERY DAY 30 tablet 2  . sildenafil (VIAGRA) 100 MG tablet Take 0.5-1 tablets (50-100 mg total) by mouth daily as needed for erectile dysfunction. 5 tablet 11  . Syringe/Needle, Disp, (SYRINGE 3CC/18GX1-1/2") 18G X 1-1/2" 3 ML MISC USE TO DRAW UP TESTOSTERONE 4 each 24  . Syringe/Needle, Disp, 18G X 1" 1 ML MISC Use to draw up testosterone 100 each 5  . testosterone cypionate (DEPOTESTOSTERONE CYPIONATE) 200 MG/ML injection INJECT 0.25MLS INTO THE MUSCLE ONCE WEEKLY 1 mL 1  . zolpidem (AMBIEN) 10 MG tablet Take 10 mg by mouth at bedtime as needed for sleep.     No  current facility-administered medications on file prior to visit.    Allergies  Allergen Reactions  . Gemfibrozil Diarrhea   Family History  Problem Relation Age of Onset  . Heart disease Father   . Cancer Father   . Colon cancer Neg Hx     PE: BP (!) 142/80   Pulse 87   Ht '5\' 9"'$  (1.753 m) Comment: measured  Wt 200 lb (90.7 kg)   SpO2 97%   BMI 29.53 kg/m  Body mass index is 29.53 kg/m.  Wt Readings from Last 3 Encounters:  09/25/18 200 lb (90.7 kg)  09/11/18 192 lb 6 oz (87.3 kg)  06/23/18 205 lb (93 kg)   Constitutional: overweight, in NAD Eyes: PERRLA, EOMI, no exophthalmos ENT: moist mucous membranes, no thyromegaly, no cervical lymphadenopathy Cardiovascular: RRR, No MRG Respiratory: CTA B Gastrointestinal: abdomen soft, NT, ND, BS+ Musculoskeletal: no deformities, strength intact in all 4 Skin: moist, warm, no rashes Neurological: no tremor with outstretched hands, DTR normal in all 4  ASSESSMENT: 1. DM2, insulin-dependent, uncontrolled, with complications - mild ckd  - We discussed in the past about a VGO system or another type of pump >> but he works outside and sweats a lot >> he does not think he can wear this - We have discussed about a regular insulin pump in the past, but he cannot use any of the attached devices due to the fact that he has hyperhidrosis. - We did discuss about the possibility to refer him to another endocrinologist, possibly at [facility], for a second opinion, but he refuses.   2. Hypogonadotropic hypogonadism  3.  Hyperprolactinemia  4. Elevated TSH  PLAN:  1.  Complex patient with uncontrolled diabetes, with increased insulin resistance.  He has an interesting pattern of CBGs, that are not significantly affected by any of the changes in his medication regimen that we tried in the past.  This may be due to the fact that he is constantly eating/snacking, he is never in a fasting state, not even at night.  He usually works a lot, 20 hours  a day and reportedly he sleeps very little.  He did well after starting Ozempic, initially lost 7 pounds before last visit, but lost 15 pounds, but then gained 8 lbs back  At last visit we also changed from [employer] to [place]. -At this visit, sugars are much higher.  He did increase [place] since last visit, to 60 units, but he sugars in the morning are mostly 300s with occasional sugars in the 400s.  It is unclear what caused this change.  He does not feel that the change was caused by changing from [place] to [place].  For now, I advised him to increase [place] to 80 units daily.  We will leave the rest of the regimen the same.  However, after resolution of his pneumonia, he will most likely need to decrease the doses of his insulin. - I suggested to:  Patient Instructions  Please increase: - Tresiba to 80 units daily.   Continue: - Metformin 1000 mg 2x daily - Jardiance 25 mg in a.m. - Ozempic 1 mg weekly  Also, continue: - Testosterone injection 50 mg weekly into the muscle.  Please return in 4 months with your sugar log.   - today, HbA1c is 8.3% (surprisingly better) - continue checking sugars at different times of the day - check 1-3x a day, rotating checks - advised for yearly eye exams >> he is UTD -We will check a lipid panel at next visit  - Return to clinic in 4 mo with sugar log   2. Hypogonadotropic Hypogonadism -Pituitary MRI report was reviewed: Normal -He continues on testosterone IM injections weekly (50 mg).  On this dose, his testosterone levels from 11/2017 was normal.  At that time, a CBC was also normal. -His PSA levels were stable in the past, but last was a year  ago.  We will need to repeat this at next lab draw, at next visit, along with a testosterone level.  I advised him to schedule next appointment fasting, at 8 AM - He has annual DRE's with his urologist.  He does have prostate hypertrophy. -He continues to have some low libido, but no breast tension, hot  flashes  3.  Hyperprolactinemia  This is due to macroprolactin so he does not need further investigation  4. Elevated TSH -Resolved per last check 10/2017 -We will recheck this at next visit  He needs all labs to go through [facility].  Philemon Kingdom, MD PhD [facility] Endocrin[facility]

## 2018-09-25 NOTE — Telephone Encounter (Signed)
There are multiple issues that need discussion and follow up. Patient will need follow up chest xrays to insure resolution. Patient needs to see a pulmonologist if he has not already seen one. Patient needs treatment for vascular disease. Please schedule appointment to discuss.

## 2018-10-07 IMAGING — CR DG RIBS W/ CHEST 3+V*R*
3 series · 3 of 3 positions shown · non-contrast
Comparison: None.

CLINICAL DATA: Twisting injury

EXAM:
RIGHT RIBS AND CHEST - 3+ VIEW

[w chest pa]
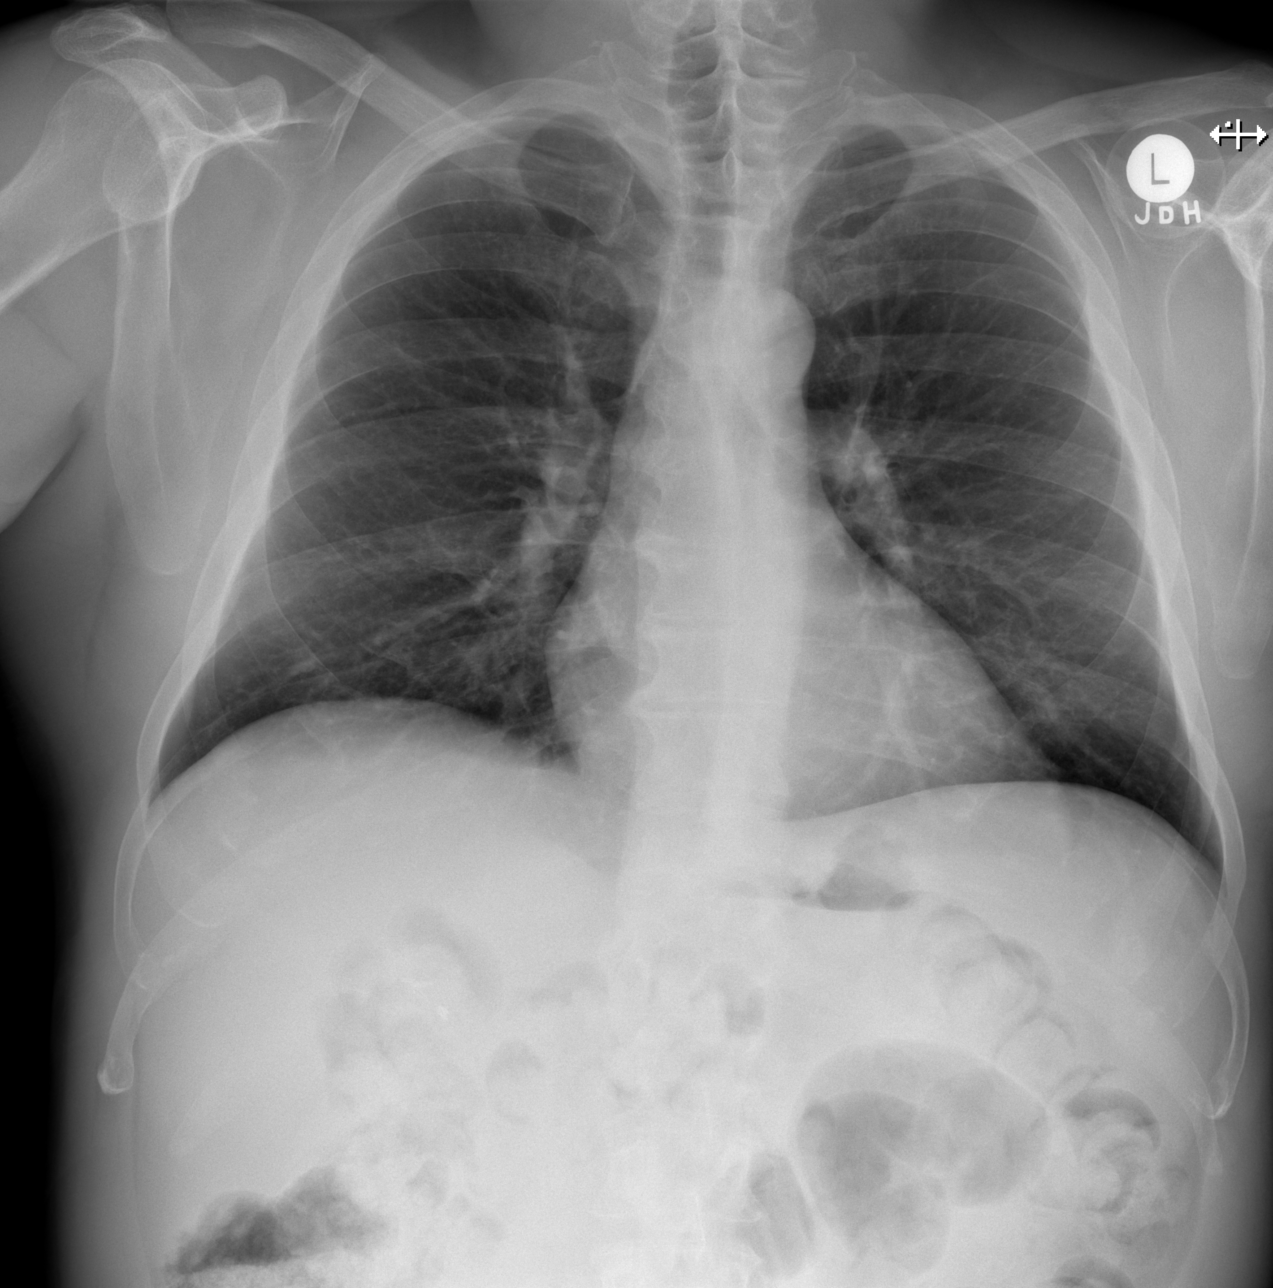

[w ribs ap lower right]
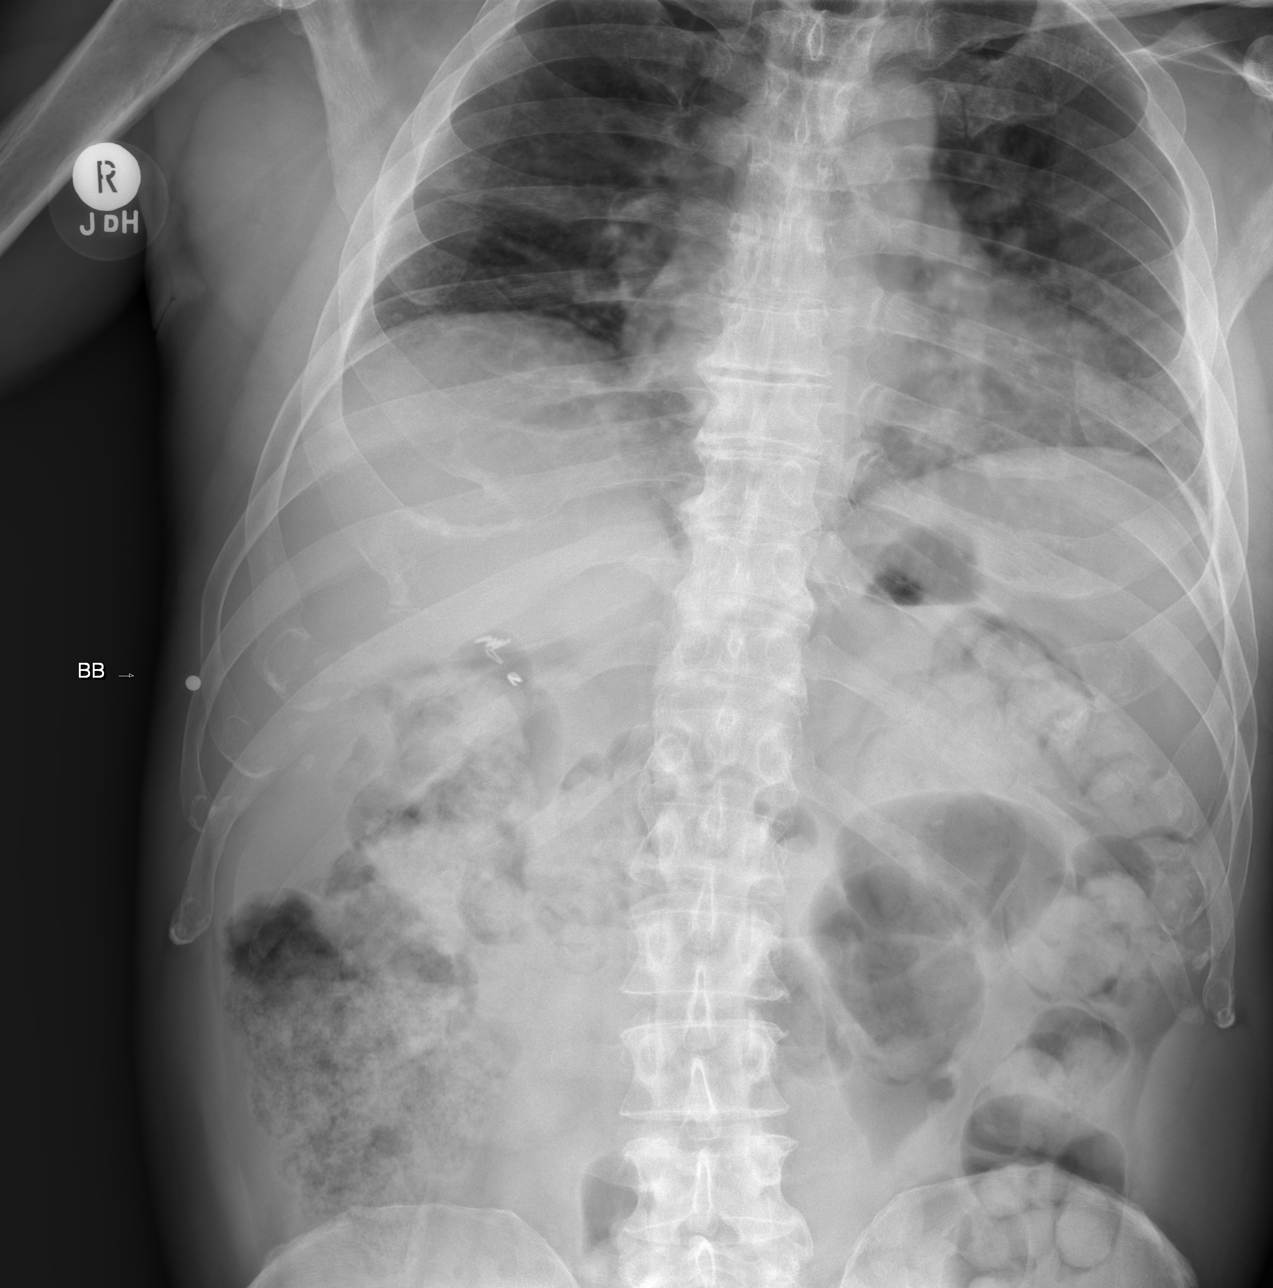

[w ribs obl right]
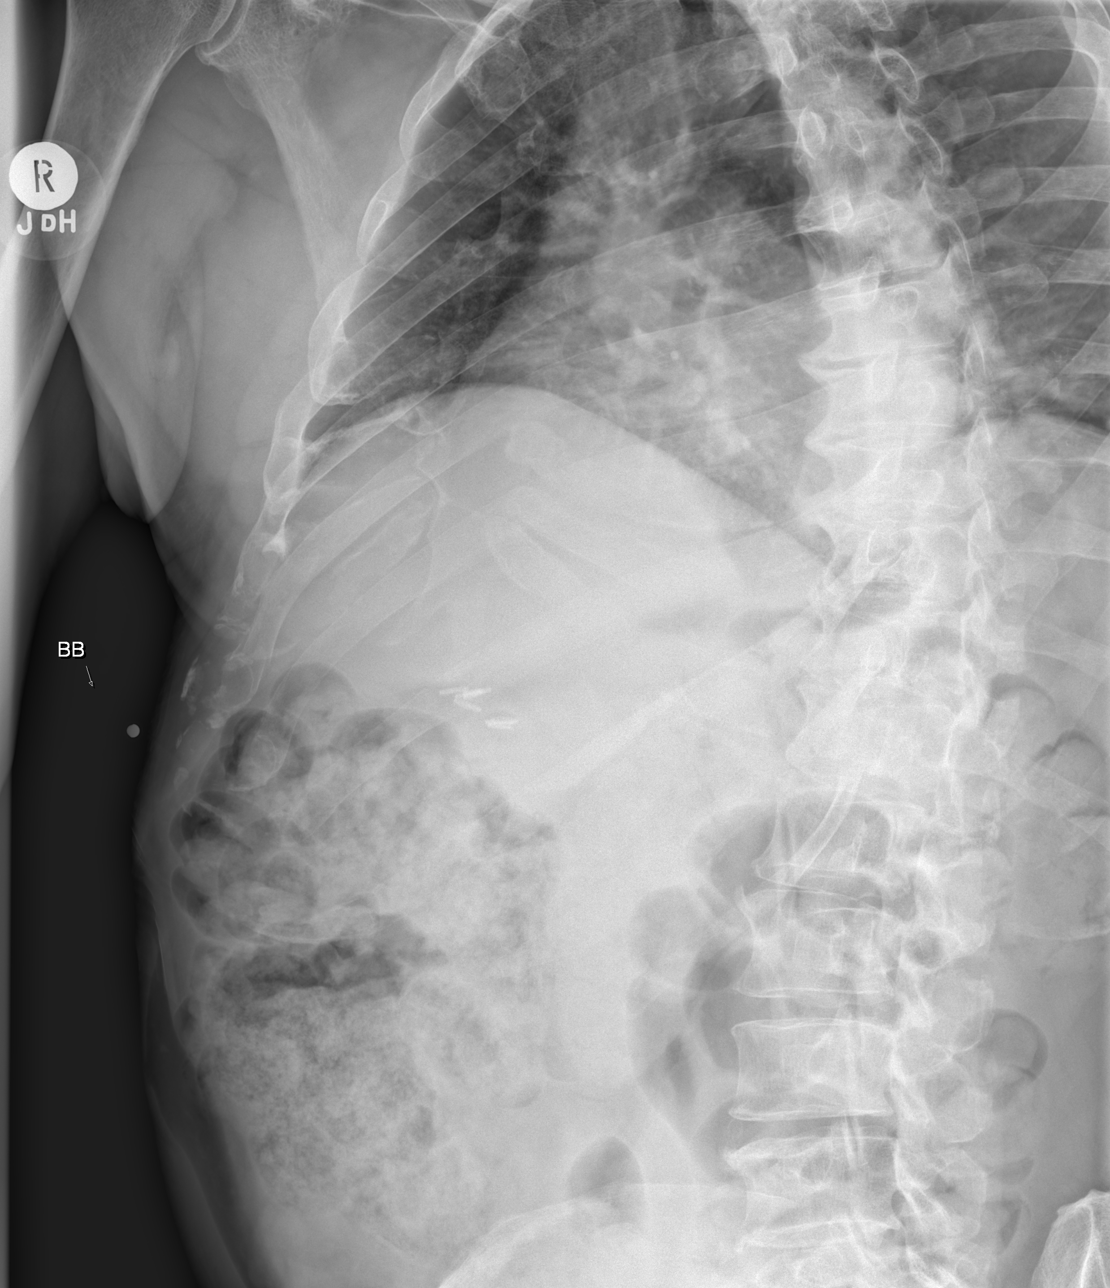

[3 of 3 positions shown; findings below may reference images not displayed]

FINDINGS: Probable fracture through the anterior right 7th rib, nondisplaced.
No effusion or pneumothorax. Lungs clear. Heart is normal size.
IMPRESSION: Probable nondisplaced fracture through the anterior right 7th rib.

## 2018-10-15 ENCOUNTER — Telehealth: Payer: Self-pay

## 2018-10-15 MED ORDER — BUPROPION HCL ER (XL) 150 MG PO TB24: ORAL_TABLET | ORAL | 1 refills | Status: DC

## 2018-10-15 MED ORDER — BUPROPION HCL ER (XL) 300 MG PO TB24: 300.0000 mg | ORAL_TABLET | Freq: Every day | ORAL | 1 refills | Status: DC

## 2018-10-15 NOTE — Telephone Encounter (Signed)
Sent both in/note to pharm stating pt would like to P/U both doses at same time/thx dmf

## 2018-10-15 NOTE — Addendum Note (Signed)
Addended by: Lerry Liner on: 10/15/2018 09:09 AM   Modules accepted: Orders

## 2018-10-15 NOTE — Telephone Encounter (Signed)
Copied from CRM [id number]. Topic: Quick Communication - Rx Refill/Question >> Oct 15, 2018  8:26 AM Maye Hides wrote: Medication:buPROPion (WELLBUTRIN XL) 150 MG 24 hr tablet  2)buPROPion (WELLBUTRIN XL) 300 MG 24 hr tablet Pt is completely out of 150 MG and states that when he has the 150 mg he will be out of the 300 MG.He wants to know if there is a way he could get these at the same time and have more than 3 refills please.Sometime it takes a while for doctor to respond  Has the patient contacted their pharmacy? Yes  (Agent: If yes, when and what did the pharmacy advise To contact PCP  Preferred Pharmacy (with phone number or street name): [phone], [place] - [address] [phone] (Phone) [phone] (Fax)    Agent: Please be advised that RX refills may take up to 3 business days. We ask that you follow-up with your pharmacy.

## 2018-10-22 ENCOUNTER — Other Ambulatory Visit: Payer: Self-pay | Admitting: Family Medicine

## 2018-10-22 NOTE — Telephone Encounter (Signed)
Copied from CRM [id number]. Topic: Quick Communication - Rx Refill/Question >> Oct 22, 2018 12:23 PM Reggie Pile, NT wrote: Medication:  PARoxetine (PAXIL) 30 MG tablet   Has the patient contacted their pharmacy? Yes, patient stated he called pharmacy and they advised him to get in contact with his doctors office.  Preferred Pharmacy (with phone number or street name):  [facility] [phone] [facility], [place] - [address] [phone] (Phone) [phone] (Fax)  Agent: Please be advised that RX refills may take up to 3 business days. We ask that you follow-up with your pharmacy.

## 2018-10-23 MED ORDER — PAROXETINE HCL 30 MG PO TABS: 30.0000 mg | ORAL_TABLET | Freq: Every day | ORAL | 5 refills | Status: DC

## 2018-10-23 NOTE — Telephone Encounter (Signed)
Rx sent 

## 2018-10-27 ENCOUNTER — Telehealth: Payer: Self-pay

## 2018-10-27 NOTE — Telephone Encounter (Signed)
Copied from CRM [id number]. Topic: General - Other >> Oct 27, 2018  3:32 PM Reggie Pile, NT wrote: Reason for CRM:  Patient called inquiring about Dr.Aron's opinion on the status of patient returning back to work. Patient states they had bronchitis 2 weeks ago, and are wanting her opinion before returning. Call back number is [phone].

## 2018-10-28 NOTE — Telephone Encounter (Signed)
Everyone should be working remotely if at ALL possible.

## 2018-10-28 NOTE — Telephone Encounter (Signed)
Spoke with pt/created letter/when signed I will call him to either fax or email it to him per his request/thx dmf

## 2018-10-28 NOTE — Telephone Encounter (Signed)
Dr. Aron - please advise 

## 2018-10-29 NOTE — Telephone Encounter (Signed)
Pt made aware that letter is ready/per his request I emailed it to him and his boss at [email or web address] dmf

## 2018-11-14 ENCOUNTER — Other Ambulatory Visit: Payer: Self-pay | Admitting: Internal Medicine

## 2018-11-14 ENCOUNTER — Telehealth: Payer: Self-pay | Admitting: Family Medicine

## 2018-11-14 ENCOUNTER — Other Ambulatory Visit: Payer: Self-pay | Admitting: Family Medicine

## 2018-11-14 ENCOUNTER — Telehealth: Payer: Self-pay | Admitting: Internal Medicine

## 2018-11-14 NOTE — Telephone Encounter (Signed)
Talked to pt/he will call the pharmacy to see if they received the Rx's that were sent in on 3.18.20 for #90 with 1 additional refill for both the 300mg  & 150mg /he will then call back if there are any discrepancies/thx dmf

## 2018-11-14 NOTE — Telephone Encounter (Signed)
RX sent

## 2018-11-14 NOTE — Telephone Encounter (Signed)
MEDICATION:   BD INSULIN SYRINGE U/F 31G X 5/16" 0.5 ML MISC    PH[facility]ACY:  [id number] [place], [place] - [address]  IS THIS A 90 DAY SUPPLY :   IS PATIENT OUT OF MEDICATION: Yes  IF NOT; HOW MUCH IS LEFT:   LAST APPOINTMENT DATE: @4 /17/2020  NEXT APPOINTMENT DATE:@6 /26/2020  DO WE HAVE YOUR PERMISSION TO LEAVE A DETAILED MESSAGE:  OTHER COMMENTS:    **Let patient know to contact pharmacy at the end of the day to make sure medication is ready. **  ** Please notify patient to allow 48-72 hours to process**  **Encourage patient to contact the pharmacy for refills or they can request refills through [facility]**

## 2018-11-14 NOTE — Telephone Encounter (Signed)
Copied from CRM [id number]. Topic: Quick Communication - Rx Refill/Question >> Nov 14, 2018 10:32 AM Lynne Logan D wrote: Medication: buPROPion (WELLBUTRIN XL) 300 MG 24 hr tablet / Pt stated that this rx only had 1 refill but his buproprion 150MG  has 5 refills. He would like to know if 5 refills can be sent if so that it matches. Please advise  Has the patient contacted their pharmacy? Yes.   (Agent: If no, request that the patient contact the pharmacy for the refill.) (Agent: If yes, when and what did the pharmacy advise?)  Preferred Pharmacy (with phone number or street name): [facility] [phone] [facility], [place] - [address] [phone] (Phone) [phone] (Fax)    Agent: Please be advised that RX refills may take up to 3 business days. We ask that you follow-up with your pharmacy.

## 2018-11-18 NOTE — Telephone Encounter (Signed)
Pt needs appt for fasting labs first/was advised in March that needed fasting labs/has not had Lipid since 2018/thx dmf

## 2018-11-20 ENCOUNTER — Telehealth: Payer: Self-pay | Admitting: Neurology

## 2018-11-20 NOTE — Telephone Encounter (Signed)
We have attempted to call the patient 2 times to schedule sleep study. Patient has been unavailable at the phone numbers we have on file and has not returned our calls. At this point we will send a letter asking pt to please contact the sleep lab to schedule their sleep study. If patient calls back we will schedule them for their sleep study. ° °

## 2018-11-23 ENCOUNTER — Other Ambulatory Visit: Payer: Self-pay | Admitting: Family Medicine

## 2018-11-24 ENCOUNTER — Other Ambulatory Visit: Payer: Self-pay

## 2018-11-24 MED ORDER — INSULIN PEN NEEDLE 32G X 4 MM MISC: 2 refills | Status: DC

## 2018-11-24 NOTE — Telephone Encounter (Signed)
Dr. Dayton Martes please advise, did you want me to set up lab appt before refilling?

## 2018-11-25 ENCOUNTER — Other Ambulatory Visit: Payer: Self-pay

## 2018-11-25 DIAGNOSIS — E1121 Type 2 diabetes mellitus with diabetic nephropathy: Secondary | ICD-10-CM

## 2018-11-25 DIAGNOSIS — E1165 Type 2 diabetes mellitus with hyperglycemia: Principal | ICD-10-CM

## 2018-11-25 DIAGNOSIS — IMO0002 Reserved for concepts with insufficient information to code with codable children: Secondary | ICD-10-CM

## 2018-11-25 MED ORDER — INSULIN PEN NEEDLE 32G X 4 MM MISC: 2 refills | Status: DC

## 2018-12-16 DIAGNOSIS — L570 Actinic keratosis: Secondary | ICD-10-CM | POA: Diagnosis not present

## 2018-12-18 ENCOUNTER — Telehealth: Payer: Self-pay

## 2018-12-18 NOTE — Telephone Encounter (Signed)
Copied from CRM [id number]. Topic: General - Other >> Dec 18, 2018  3:00 PM Tamela Oddi wrote: Reason for CRM: Patient called to request that the nurse or doctor give him a note stating that he does not have to wear a mask to go back to work.  Patient said that the mask makes it difficult for him to breath and he feels claustrophobic when he wears one.  Please advise and call patient back at [phone]

## 2018-12-18 NOTE — Telephone Encounter (Signed)
Okay to write note as requested. 

## 2018-12-18 NOTE — Telephone Encounter (Signed)
TA-Plz see note below/he does not want to have to wear a mask at work/he needs a note to support this due to it "makes it difficult to breathe....and makes me feel claustrophobic."/plz advise/thx dmf

## 2018-12-22 ENCOUNTER — Other Ambulatory Visit: Payer: Self-pay | Admitting: Internal Medicine

## 2018-12-25 NOTE — Telephone Encounter (Signed)
Created note per TA/emailed to pt per his request to his work e-mail/thx dmf

## 2019-01-20 ENCOUNTER — Other Ambulatory Visit: Payer: Self-pay | Admitting: Internal Medicine

## 2019-01-23 ENCOUNTER — Encounter: Payer: Self-pay | Admitting: Internal Medicine

## 2019-01-23 ENCOUNTER — Ambulatory Visit (INDEPENDENT_AMBULATORY_CARE_PROVIDER_SITE_OTHER): Payer: 59 | Admitting: Internal Medicine

## 2019-01-23 ENCOUNTER — Other Ambulatory Visit: Payer: Self-pay

## 2019-01-23 ENCOUNTER — Other Ambulatory Visit: Payer: Self-pay | Admitting: Internal Medicine

## 2019-01-23 DIAGNOSIS — E1121 Type 2 diabetes mellitus with diabetic nephropathy: Secondary | ICD-10-CM | POA: Diagnosis not present

## 2019-01-23 DIAGNOSIS — E230 Hypopituitarism: Secondary | ICD-10-CM

## 2019-01-23 DIAGNOSIS — E039 Hypothyroidism, unspecified: Secondary | ICD-10-CM | POA: Diagnosis not present

## 2019-01-23 DIAGNOSIS — IMO0002 Reserved for concepts with insufficient information to code with codable children: Secondary | ICD-10-CM

## 2019-01-23 DIAGNOSIS — R7989 Other specified abnormal findings of blood chemistry: Secondary | ICD-10-CM | POA: Diagnosis not present

## 2019-01-23 DIAGNOSIS — E1165 Type 2 diabetes mellitus with hyperglycemia: Secondary | ICD-10-CM

## 2019-01-23 MED ORDER — NOVOLOG FLEXPEN 100 UNIT/ML ~~LOC~~ SOPN: 10.0000 [IU] | PEN_INJECTOR | Freq: Three times a day (TID) | SUBCUTANEOUS | 5 refills | Status: DC

## 2019-01-23 MED ORDER — TRESIBA FLEXTOUCH 200 UNIT/ML ~~LOC~~ SOPN: 80.0000 [IU] | PEN_INJECTOR | Freq: Every day | SUBCUTANEOUS | 11 refills | Status: DC

## 2019-01-23 NOTE — Patient Instructions (Addendum)
Please continue: - Tresiba 80 units daily - Metformin 1000 mg 2x daily - Jardiance 25 mg in a.m. - Ozempic 1 mg weekly  Please start: - NovoLog 10 to 15 units 3 times a day 15 minutes before each meal  Also, continue: - Testosterone injection 50 mg weekly into the muscle.  Please come for labs fasting, between 8 and 9 in the morning, halfway between your testosterone injections.  Please return in 4 months with your sugar log.

## 2019-01-23 NOTE — Telephone Encounter (Signed)
Yes

## 2019-01-23 NOTE — Progress Notes (Signed)
Patient ID: Martin Downs, male   DOB: 1966/05/01, 53 y.o.   MRN: [id number]  Patient location: Home My location: Office  Referring Provider: Lucille Passy, MD  I connected with the patient on 01/23/19 at  11:41 am EDT by telephone and verified that I am speaking with the correct person.   I discussed the limitations of evaluation and management by telephone and the availability of in person appointments. The patient expressed understanding and agreed to proceed.   Details of the encounter are shown below.  HPI: Martin Downs is a 53 y.o.-year-old male, returning for f/u for DM2 dx 2014, insulin-dependent since 02/2014, uncontrolled, with complications (mild CKD) and hypogonadotropic hypogonadism. Last visit 4 months ago.  DM2: Last hemoglobin A1c was: Lab Results  Component Value Date   HGBA1C 8.3 (A) 09/25/2018   HGBA1C 8.7 11/11/2017   HGBA1C 8.1 04/15/2017   He is on: - Basaglar 40 >> Tresiba 60 >> 80 units daily - Metformin 1000 mg 2x daily - Farxiga >> Jardiance 25 mg in a.m - Ozempic 1 mg weekly We stopped Actoplus 15-850 mg in 02/2014. We stopped Glipizide when we increased Novolog. Insurance does not cover Invokana. We tried to add Actos 15 mg daily (added back 06/2016) >> stopped  We tried U500 insulin.  He checks his sugars 2-3 times a day, and they are all nonfasting as he is almost never on an empty stomach per his report: - am:   230-250 >> 226-465 >> 210-240 - 2h after b'fast: n/c >>  239, 247 >> n/c - lunch: 150-200 >> n/c >> 215 >> 230 - 2h after lunch:  n/c >> 230-280 >> 200-240 - dinner: 211, 231 >> n/c >> 327-475 >> 200-240 - 2h after dinner: 220-280 >> 310-425 >> 200-240 - bedtime:200-220 >> n/c >> 240-284 >> n/c  - nighttime: 130-218 >> n/c >> 239 >> n/c  Lowest: 208 Higher: 300s >> 400s >> 280  Meter: One Touch Ultra mini  Pt's meals are -he grazes throughout the day: - Breakfast: oatmeal or yoghurt or fruit bar/cup (largest meal) -  Lunch: PB sandwich - Dinner: chicken leftovers - Snacks: sandwich, 1 pack of crackers, diet Mountain dew  -+ Mild CKD, last BUN/creatinine:  Lab Results  Component Value Date   BUN 13 12/11/2017   CREATININE 1.26 12/11/2017  Not on ACE inhibitor. -+ HL; last set of lipids: Lab Results  Component Value Date   CHOL 104 04/30/2017   HDL 32 (L) 04/30/2017   LDLCALC 31 04/30/2017   LDLDIRECT 136.0 11/01/2014   TRIG 204 (H) 04/30/2017   CHOLHDL 3.3 04/30/2017  On Crestor and fenofibrate. - last eye exam was on 12/2017: No DR - no numbness and tingling in his feet.  Hypogonadotropic hypogonadism: - He was on Androgel  in 2015 >> his testosterone levels did not increase and he did not feel better  >> he is now on intramuscular testosterone 50 mg weekly  Reviewed his testosterone levels going back to 2016 - Most recent level was normal:  Component     Latest Ref Rng & Units 04/30/2017 12/11/2017  Testosterone     264 - 916 ng/dL 1,108 (H) 485  Testosterone Free     7.2 - 24.0 pg/mL 28.6 (H) 14.6  Sex Horm Binding Glob, Serum     19.3 - 76.4 nmol/L 33.0 39.5   PSA levels were normal: Component     Latest Ref Rng & Units 04/30/2017  Prostate Specific Ag, Serum  0.0 - 4.0 ng/mL 0.7   Lab Results  Component Value Date   PSA 0.69 03/01/2016   PSA 0.91 10/18/2015   PSA 0.37 01/17/2010   PSA 0.55 11/07/2009   Hemoglobin/hematocrit levels were normal: Lab Results  Component Value Date   WBC 10.4 11/11/2017   HGB 16.2 11/11/2017   HCT 48.8 11/11/2017   MCV 94.3 11/11/2017   PLT 196.0 11/11/2017   -We checked his pituitary function in 2016 and the labs were normal except for inappropriately normal LH and FSH, and also low testosterone.  Total prolactin was higher, but the monomeric prolactin was normal: Component     Latest Ref Rng 05/16/2015  Testosterone     300 - 890 ng/dL 132 (L)  Sex Hormone Binding     10 - 50 nmol/L 28  Testosterone Free     47.0 - 244.0 pg/mL  26.9 (L)  Testosterone-% Free     1.6 - 2.9 % 2.0  IGF-I, LC/MS     50 - 317 ng/mL 99  Z-Score (Male)     -2.0-+2.0 SD -0.7  Prolactin, Total     2.0 - 18.0 ng/mL 35.4 (H)  Prolactin, Monomeric     3.4 - 14.8 ng/mL 11.4  LH     1.50 - 9.30 mIU/mL 2.03  FSH     1.4 - 18.1 mIU/ML 5.9  Cortisol, Plasma      14.2  C206 ACTH     6 - 50 pg/mL 32   Previous labs (drawn at 8:33 AM)   Prolactin 38.6 (2.1-17.1)  FSH 5.6, LH 2.0  Total testosterone 134 (300-890), bioavailable testosterone 35.5 ng/dL (130.5-681.7), free testosterone 18 pg/mL (47-244), SHBG 29 (10-50)  PSA 0.16  Estradiol 17.1 (0-39)  Reviewed the report of his pituitary MRI 07/14/2015 >> no pituitary lesion  Hyperprolactinemia: -  found during investigation for low testosterone, by Dr. Junious Silk, his urologist. -This turned out to be related to macro-prolactin, which does not require further investigation or treatment  He denies gynecomastia, galactorrhea, breast tenderness, hot flashes.   Patient's alkaline phosphatase was low in 04/2017 (25) so we started a multivitamin.  He had 1 elevated TSH in the past, but recent levels were normal: Lab Results  Component Value Date   TSH 2.79 11/11/2017   TSH 1.90 03/01/2016   TSH 1.52 05/16/2015   TSH 1.56 02/11/2014   TSH 1.68 05/13/2013   TSH 1.84 03/03/2013   TSH 0.69 12/29/2010   TSH 0.58 04/18/2010   TSH 0.58 01/17/2010   TSH 1.34 07/18/2009    ROS: Constitutional: no weight gain/no weight loss, +  fatigue, + subjective hyperthermia, no subjective hypothermia Eyes: no blurry vision, no xerophthalmia ENT: no sore throat, no nodules palpated in neck, no dysphagia, no odynophagia, no hoarseness Cardiovascular: no CP/no SOB/no palpitations/no leg swelling Respiratory: no cough/no SOB/no wheezing Gastrointestinal: no N/no V/no D/no C/no acid reflux Musculoskeletal: no muscle aches/no joint aches Skin: no rashes, no hair loss Neurological: no tremors/no  numbness/no tingling/no dizziness + Difficulty with erections  I reviewed pt's medications, allergies, PMH, social hx, family hx, and changes were documented in the history of present illness. Otherwise, unchanged from my initial visit note.  Past Medical History:  Diagnosis Date  . Anxiety   . Borderline diabetes   . Chronic pain   . Depression   . Diabetes mellitus without complication ([place])   . Hyperlipidemia   . Narcotic dependence ([place])   . Sleep apnea    no cpap  Past Surgical History:  Procedure Laterality Date  . CHOLECYSTECTOMY N/A 05/30/2016   Procedure: LAPAROSCOPIC CHOLECYSTECTOMY WITH INTRAOPERATIVE CHOLANGIOGRAM;  Surgeon: Donnie Mesa, MD;  Location: [facility];  Service: General;  Laterality: N/A;  . CHOLECYSTECTOMY N/A 05/30/2016   Procedure: OPEN COMMON BILE DUCT EXPLORATION; INSERTION OF T-TUBE;  Surgeon: Donnie Mesa, MD;  Location: [facility];  Service: General;  Laterality: N/A;  . removal of bone spur Bilateral 1995   elbps  . TOOTH EXTRACTION  2017  . UMBILICAL HERNIA REPAIR N/A 05/30/2016   Procedure: HERNIA REPAIR UMBILICAL ADULT;  Surgeon: Donnie Mesa, MD;  Location: [facility];  Service: General;  Laterality: N/A;   Social History   Socioeconomic History  . Marital status: Married    Spouse name: Not on file  . Number of children: Y  . Years of education: Not on file  . Highest education level: Not on file  Occupational History  . Occupation: Music therapist: UNEMPLOYED  Social Needs  . Financial resource strain: Not on file  . Food insecurity    Worry: Not on file    Inability: Not on file  . Transportation needs    Medical: Not on file    Non-medical: Not on file  Tobacco Use  . Smoking status: Never Smoker  . Smokeless tobacco: Current User    Types: Snuff  Substance and Sexual Activity  . Alcohol use: No    Comment: quir 2009  . Drug use: Not Currently  . Sexual activity: Not on file  Lifestyle  . Physical activity    Days per week:  Not on file    Minutes per session: Not on file  . Stress: Not on file  Relationships  . Social Herbalist on phone: Not on file    Gets together: Not on file    Attends religious service: Not on file    Active member of club or organization: Not on file    Attends meetings of clubs or organizations: Not on file    Relationship status: Not on file  . Intimate partner violence    Fear of current or ex partner: Not on file    Emotionally abused: Not on file    Physically abused: Not on file    Forced sexual activity: Not on file  Other Topics Concern  . Not on file  Social History Narrative  . Not on file   Current Outpatient Medications on File Prior to Visit  Medication Sig Dispense Refill  . aspirin 81 MG tablet Take 81 mg by mouth daily.      . BD INSULIN SYRINGE U/F 31G X 5/16" 0.5 ML MISC USE 3 TIMES DAILY 100 each 0  . Blood Glucose Monitoring Suppl (ONE TOUCH ULTRA MINI) W/DEVICE KIT Use as directed to test blood sugar twice daily 250.00 1 each 0  . buPROPion (WELLBUTRIN XL) 150 MG 24 hr tablet TAKE 1 TABLET BY MOUTH EVERY DAY WITH 300MG TO EQUAL 450MG 90 tablet 1  . buPROPion (WELLBUTRIN XL) 300 MG 24 hr tablet Take 1 tablet (300 mg total) by mouth daily. Take with 1qd of 119m to equal 4556mtotal 90 tablet 1  . doxycycline (VIBRA-TABS) 100 MG tablet Take 1 tablet (100 mg total) by mouth 2 (two) times daily. 20 tablet 0  . fenofibrate 160 MG tablet TAKE 1 TABLET BY MOUTH EVERY DAY 30 tablet 5  . fluticasone (FLONASE) 50 MCG/ACT nasal spray SPRAY 2 SPRAYS INTO [facility]  NOSTRIL EVERY DAY 16 g 5  . Insulin Pen Needle 32G X 4 MM MISC Use to inject insulin 1 time daily as instructed. 200 each 2  . Insulin Syringe-Needle U-100 25G X 1" 1 ML MISC USE TO INJECT TESTOSTERONE WEEKLY 4 each PRN  . JARDIANCE 25 MG TABS tablet TAKE 1 TABLET BY MOUTH EVERY DAY 30 tablet 2  . metFORMIN (GLUCOPHAGE) 1000 MG tablet TAKE 1 TABLET (1,000 MG TOTAL) BY MOUTH 2 (TWO) TIMES DAILY WITH A  MEAL. 60 tablet 2  . methadone (DOLOPHINE) 10 MG/ML solution Take 180 mg by mouth daily.     . Multiple Vitamin (MULTIVITAMIN) tablet Take 2 tablets by mouth 2 (two) times daily.    . Multiple Vitamins-Minerals (MEGA MULTI MEN PO) Take by mouth.    . mupirocin ointment (BACTROBAN) 2 % Place 1 application into the nose 2 (two) times daily. 22 g 0  . ONE TOUCH ULTRA TEST test strip USE 8 TIMES A DAY AS INSTRUCTED 300 each 4  . ONETOUCH DELICA LANCETS 03K MISC USE TO TEST BLOOD SUGAR 4 TIMES DAILY AS INSTRUCTED. 200 each 3  . OZEMPIC, 1 MG/DOSE, 2 MG/1.5ML SOPN INJECT 1 MG INTO THE SKIN ONCE A WEEK. 3 pen 4  . PARoxetine (PAXIL) 30 MG tablet Take 1 tablet (30 mg total) by mouth daily. 30 tablet 5  . rosuvastatin (CRESTOR) 10 MG tablet TAKE 1 TABLET BY MOUTH EVERY DAY. MUST MAKE APPT FOR FASTING LEVELS FOR REFILLS 30 tablet 2  . sildenafil (VIAGRA) 100 MG tablet Take 0.5-1 tablets (50-100 mg total) by mouth daily as needed for erectile dysfunction. 5 tablet 11  . SYRINGE-NEEDLE, DISP, 3 ML (B-D 3CC LUER-LOK SYR 18GX1-1/2) 18G X 1-1/2" 3 ML MISC USE TO DRAW UP TESTOSTERONE 1 each 1  . Syringe/Needle, Disp, 18G X 1" 1 ML MISC Use to draw up testosterone 100 each 5  . testosterone cypionate (DEPOTESTOSTERONE CYPIONATE) 200 MG/ML injection INJECT 0.25MLS INTO THE MUSCLE ONCE WEEKLY 1 mL 1  . TRESIBA FLEXTOUCH 200 UNIT/ML SOPN INJECT 60 UNITS INTO THE SKIN DAILY. 9 pen 11  . zolpidem (AMBIEN) 10 MG tablet Take 10 mg by mouth at bedtime as needed for sleep.     No current facility-administered medications on file prior to visit.    Allergies  Allergen Reactions  . Gemfibrozil Diarrhea   Family History  Problem Relation Age of Onset  . Heart disease Father   . Cancer Father   . Colon cancer Neg Hx     PE: There were no vitals taken for this visit. There is no height or weight on file to calculate BMI.  Wt Readings from Last 3 Encounters:  09/25/18 200 lb (90.7 kg)  09/11/18 192 lb 6 oz (87.3  kg)  06/23/18 205 lb (93 kg)   Constitutional:  in NAD  The physical exam was not performed (telephone visit).  ASSESSMENT: 1. DM2, insulin-dependent, uncontrolled, with complications - mild ckd  - We discussed in the past about a VGO system or another type of pump >> but he works outside and sweats a lot >> he does not think he can wear this - We have discussed about a regular insulin pump in the past, but he cannot use any of the attached devices due to the fact that he has hyperhidrosis. - We did discuss about the possibility to refer him to another endocrinologist, possibly at [facility], for a second opinion, but he refuses.   2. Hypogonadotropic hypogonadism  3.  Hyperprolactinemia  4. Elevated TSH  PLAN:  1.  Complex patient with uncontrolled diabetes, and high insulin resistance.  His sugars are not significantly affected by any of the changes in his medication regimen that we have tried in the past.  This may be due to the fact that he is constantly eating/snacking and he is never in a fasting state, not even at night.  He usually works excessively, 20 hours a day and reportedly sleeps very little.  At last visit, we increased his Tyler Aas and continued his metformin and Jardiance along with the high dose of Ozempic.  Sugars were much higher than as he had multifocal pneumonia. -At this visit, he tells me that his sugars have improved, but they are back to his baseline of 200-240.  At this point, I advised him to mealtime insulin.  He has been on NovoLog in the past so we will restart this.  I advised him to inject 15 minutes before each of his main meals. - I suggested to:  Patient Instructions  Please continue: - Tresiba 80 units daily - Metformin 1000 mg 2x daily - Jardiance 25 mg in a.m. - Ozempic 1 mg weekly  Please start: - NovoLog 10 to 15 units 3 times a day 15 minutes before each meal  Also, continue: - Testosterone injection 50 mg weekly into the muscle.  Please come  for labs fasting, between 8 and 9 in the morning, halfway between your testosterone injections.  Please return in 4 months with your sugar log.   - we will check an HbA1c level when he returns to the clinic. - continue checking sugars at different times of the day - check 2x a day, rotating checks - advised for yearly eye exams >> he is UTD - Return to clinic in 4 mo with sugar log    2. Hypogonadotropic Hypogonadism -Pituitary MRI report was reviewed: Normal -He continues on testosterone IM injections weekly (50 mg).  On this dose, his testosterone levels from 11/2017 were normal.  At that time, CBC was also normal.  Last PSA was normal but he did not have a recent value (last 04/2017).  We will need to check this at next lab draw. -I advised him to schedule a fasting lab appointment to check a CBC, PSA, testosterone level -He continues to have annual DRE's with his urologist.  He does have prostate hypertrophy. -He continues to have some low libido, but no breast tension or hot flashes.  3.  Hyperprolactinemia  -This is due to macro prolactin so he does not need further investigation for this  4. Elevated TSH -Resolved per last check 10/2017 -We will need to recheck this when he returns to the clinic  He needs all labs to go through [place] This Encounter  Procedures  . TSH  . Microalbumin / creatinine urine ratio  . Hemoglobin A1c  . Lipid panel  . PSA  . Testosterone Free with SHBG  . CBC  . CMP14+EGFR   - time spent with the patient: 9 min, of which >50% was spent in obtaining information about his symptoms, reviewing his previous labs, evaluations, and treatments, counseling him about his conditions (please see the discussed topics above), and developing a plan to further investigate and treat them.   Philemon Kingdom, MD PhD [facility] Endocrinology

## 2019-01-23 NOTE — Telephone Encounter (Signed)
Ok to change

## 2019-03-08 ENCOUNTER — Other Ambulatory Visit: Payer: Self-pay | Admitting: Family Medicine

## 2019-03-15 ENCOUNTER — Other Ambulatory Visit: Payer: Self-pay | Admitting: Family Medicine

## 2019-03-15 ENCOUNTER — Other Ambulatory Visit: Payer: Self-pay | Admitting: Internal Medicine

## 2019-03-19 ENCOUNTER — Other Ambulatory Visit: Payer: Self-pay

## 2019-03-19 MED ORDER — ROSUVASTATIN CALCIUM 10 MG PO TABS: ORAL_TABLET | ORAL | 0 refills | Status: DC

## 2019-03-26 LAB — HM DIABETES EYE EXAM

## 2019-04-15 ENCOUNTER — Other Ambulatory Visit: Payer: Self-pay | Admitting: Internal Medicine

## 2019-04-16 ENCOUNTER — Other Ambulatory Visit (HOSPITAL_COMMUNITY)
Admission: RE | Admit: 2019-04-16 | Discharge: 2019-04-16 | Disposition: A | Discharge status: Home / Self Care | Payer: 59 | Source: Ambulatory Visit | Attending: Internal Medicine | Admitting: Internal Medicine

## 2019-04-16 DIAGNOSIS — Z20828 Contact with and (suspected) exposure to other viral communicable diseases: Secondary | ICD-10-CM | POA: Diagnosis not present

## 2019-04-16 DIAGNOSIS — Z01812 Encounter for preprocedural laboratory examination: Secondary | ICD-10-CM | POA: Diagnosis present

## 2019-04-17 LAB — NOVEL CORONAVIRUS, NAA (HOSP ORDER, SEND-OUT TO REF LAB; TAT 18-24 HRS): SARS-CoV-2, NAA: NOT DETECTED

## 2019-04-22 ENCOUNTER — Telehealth: Payer: Self-pay

## 2019-04-22 DIAGNOSIS — R61 Generalized hyperhidrosis: Secondary | ICD-10-CM

## 2019-04-22 DIAGNOSIS — Z79891 Long term (current) use of opiate analgesic: Secondary | ICD-10-CM

## 2019-04-22 DIAGNOSIS — Z72821 Inadequate sleep hygiene: Secondary | ICD-10-CM

## 2019-04-22 DIAGNOSIS — G4731 Primary central sleep apnea: Secondary | ICD-10-CM

## 2019-04-22 DIAGNOSIS — R002 Palpitations: Secondary | ICD-10-CM

## 2019-04-22 NOTE — Telephone Encounter (Signed)
Patient came for cpap titration and was very nervous about mask. Tech tried multiple masks and none were ones he felt comfortable using. Pt mentioned he slept 2 hours at a time ans stated he had frequent nightmares attributed to finding 5-6 different loved ones deceased. Paient did not want to stay for study. He felt he could do this better at home. There was no particular mask he liked so he will need to start with nasal pillows and hopefully he can tolerate at home. We ned an Auto cpap ordered for home.

## 2019-04-22 NOTE — Telephone Encounter (Signed)
Patient was leaving the CPAP titration before it started- no show. He got Covid tested, made this appointment and did not want to stay? Needs an auto titration device now- but unclear how he will tolerate that at a home.  I will order auto CPAP 6-13 cm water, 2 cm EPR, heated humidity and nasal cradle or pillow ( for claustrophobia)

## 2019-04-23 NOTE — Telephone Encounter (Signed)
I called pt. I advised pt that Dr. Brett Fairy was advised that the patient was unable to sleep in the sleep lab. Dr. Brett Fairy recommends that pt start auto CPAP. I reviewed PAP compliance expectations with the pt. Pt is agreeable to starting a CPAP. I advised pt that an order will be sent to a DME, [facility], and [facility] will call the pt within about one week after they file with the pt's insurance. [facility] will show the pt how to use the machine, fit for masks, and troubleshoot the CPAP if needed. A follow up appt was made for insurance purposes with Debbora Presto, NP on Nov 19,2020 at 7:45 am. Pt verbalized understanding to arrive 15 minutes early and bring their CPAP. A letter with all of this information in it will be mailed to the pt as a reminder. I verified with the pt that the address we have on file is correct. Pt verbalized understanding of results. Pt had no questions at this time but was encouraged to call back if questions arise. I have sent the order to [facility] and have received confirmation that they have received the order.

## 2019-05-05 ENCOUNTER — Encounter: Payer: Self-pay | Admitting: Neurology

## 2019-05-05 NOTE — Telephone Encounter (Signed)
[facility] has reached out to inform,  "Just letting you know this patient was called in 09/24, 10/2, and 10/6 (email was also sent) Not able to get in touch with this patient. I am going to void this order."  Pt will have to reach out to [facility] to get set up.

## 2019-05-13 ENCOUNTER — Ambulatory Visit: Payer: Self-pay | Admitting: Family Medicine

## 2019-05-13 NOTE — Telephone Encounter (Signed)
Pt Reports "Extreme fatigue" onset Sunday. Reports nausea, no vomiting and headache, mild body aches. Reports headache 3-4/10 , at times 6-7/10, tylenol, aleve ineffective. Does not check BP at home. No vomiting but nausea "Pretty much constant." States afebrile. Reports CBGs "Usual range."  Also states not sleeping well at night, "3-4 hours then I wake up." No known exposure to covid.   Was tested 9/17 per workplace requirement, was asymptomatic at that time.  Call transferred to [facility], for consideration of virtual appt. Care advise given; go to ED if symptoms worsen.  CB# [id number] Reason for Disposition . [1] MODERATE weakness (i.e., interferes with work, school, normal activities) AND [2] persists > 3 days  Answer Assessment - Initial Assessment Questions 1. DESCRIPTION: "Describe how you are feeling."     "Extreme fatigue"  Nausea, headache 2. SEVERITY: "How bad is it?"  "Can you stand and walk?"   - MILD - Feels weak or tired, but does not interfere with work, school or normal activities   - [place] to stand and walk; weakness interferes with work, school, or normal activities   - SEVERE - Unable to stand or walk     moderate 3. ONSET:  "When did the weakness begin?"     *Sunday afternoon 4. CAUSE: "What do you think is causing the weakness?"     Unsure 5. MEDICINES: "Have you recently started a new medicine or had a change in the amount of a medicine?"     no 6. OTHER SYMPTOMS: "Do you have any other symptoms?" (e.g., chest pain, fever, cough, SOB, vomiting, diarrhea, bleeding, other areas of pain)     Headache nausea, mild body aches  Protocols used: WEAKNESS (GENERALIZED) AND FATIGUE-A-AH

## 2019-05-14 ENCOUNTER — Other Ambulatory Visit: Payer: Self-pay

## 2019-05-14 ENCOUNTER — Telehealth (INDEPENDENT_AMBULATORY_CARE_PROVIDER_SITE_OTHER): Payer: 59 | Admitting: Family Medicine

## 2019-05-14 ENCOUNTER — Encounter: Payer: Self-pay | Admitting: Family Medicine

## 2019-05-14 VITALS — Temp 98.7°F | Wt 200.0 lb

## 2019-05-14 DIAGNOSIS — Z20822 Contact with and (suspected) exposure to covid-19: Secondary | ICD-10-CM

## 2019-05-14 DIAGNOSIS — Z20828 Contact with and (suspected) exposure to other viral communicable diseases: Secondary | ICD-10-CM | POA: Diagnosis not present

## 2019-05-14 NOTE — Assessment & Plan Note (Signed)
Symptoms concerning for covid. Letter sent to pt through mychart for his employer explaining testing process and home isolation,  Referred patient for Covid home monitoring.   Covid test ordered and pt directed to go to [facility] site. The patient indicates understanding of these issues and agrees with the plan.  Orders Placed This Encounter  Procedures  . Novel Coronavirus, NAA ([facility])

## 2019-05-14 NOTE — Patient Instructions (Addendum)
Health Maintenance Due  Topic Date Due  . PNEUMOCOCCAL POLYSACCHARIDE VACCINE AGE 54-64 HIGH RISK  05/02/1966  . HIV Screening  05/03/1979  . FOOT EXAM  03/13/2017  . URINE MICROALBUMIN  11/12/2018  . HEMOGLOBIN A1C  03/26/2019   Health Maintenance Due  Topic Date Due  . PNEUMOCOCCAL POLYSACCHARIDE VACCINE AGE 54-64 HIGH RISK  05/02/1966  . HIV Screening  05/03/1979  . FOOT EXAM  03/13/2017  . URINE MICROALBUMIN  11/12/2018  . HEMOGLOBIN A1C  03/26/2019    Depression screen [facility] 2/9 12/11/2017 11/11/2017  Decreased Interest 2 3  Down, Depressed, Hopeless 3 3  PHQ - 2 Score 5 6  Altered sleeping 3 3  Tired, decreased energy 3 3  Change in appetite 3 3  Feeling bad or failure about yourself  2 0  Trouble concentrating 3 3  Moving slowly or fidgety/restless 2 0  Suicidal thoughts 0 0  PHQ-9 Score 21 18  Difficult doing work/chores Very difficult Extremely dIfficult  Some recent data might be hidden     Depression screen [facility] 2/9 12/11/2017 11/11/2017  Decreased Interest 2 3  Down, Depressed, Hopeless 3 3  PHQ - 2 Score 5 6  Altered sleeping 3 3  Tired, decreased energy 3 3  Change in appetite 3 3  Feeling bad or failure about yourself  2 0  Trouble concentrating 3 3  Moving slowly or fidgety/restless 2 0  Suicidal thoughts 0 0  PHQ-9 Score 21 18  Difficult doing work/chores Very difficult Extremely dIfficult  Some recent data might be hidden

## 2019-05-14 NOTE — Telephone Encounter (Signed)
Pt had appt today with pcp

## 2019-05-14 NOTE — Progress Notes (Signed)
Virtual Visit via Video   Due to the COVID-19 pandemic, this visit was completed with telemedicine (audio/video) technology to reduce patient and provider exposure as well as to preserve personal protective equipment.   I connected with Martin Downs by a video enabled telemedicine application and verified that I am speaking with the correct person using two identifiers. Location patient: Home Location provider: [facility], Office Persons participating in the virtual visit: Ronzell, Laban, MD   I discussed the limitations of evaluation and management by telemedicine and the availability of in person appointments. The patient expressed understanding and agreed to proceed.  Care Team   Patient Care Team: Lucille Passy, MD as PCP - General  Subjective:   HPI: Fatigue nad HA x 4 days.  Had mild temp- tmax 99.9. Has also had nausea without vomiting.  Diarrhea yesterday.  Has never felt this tired in his life.  Does not feel it is from low testestosterone given the acute onset.   Review of Systems  Constitutional: Positive for fever and malaise/fatigue.  HENT: Positive for sore throat. Negative for ear pain.   Eyes: Negative.   Respiratory: Negative.   Cardiovascular: Negative.   Gastrointestinal: Negative.   Musculoskeletal: Positive for myalgias.  Skin: Negative.   Neurological: Positive for headaches. Negative for dizziness, tingling, tremors, sensory change, speech change, focal weakness, seizures, loss of consciousness and weakness.  Endo/Heme/Allergies: Negative.   Psychiatric/Behavioral: Negative.   All other systems reviewed and are negative.    Patient Active Problem List   Diagnosis Date Noted  . Acute frontal sinusitis 09/11/2018  . Bronchitis 09/11/2018  . Complex sleep apnea syndrome 09/03/2018  . Chronically on opiate therapy 06/23/2018  . Heart palpitations 06/23/2018  . Unexplained night sweats 06/23/2018  . Other insomnia  06/23/2018  . Sleep apnea 06/23/2018  . Hypersomnia, persistent 06/23/2018  . Paronychia of thumb 04/18/2018  . Encounter for long-term methadone use 02/19/2018  . Umbilical hernia 18/59/0931  . Acute gangrenous cholecystitis s/p cholecystectomy 05/30/2016 05/31/2016  . Common bile duct repair with T-Tube 11/192017 05/31/2016  . Cholecystitis 05/30/2016  . Erectile dysfunction 03/13/2016  . Premature ejaculation 03/13/2016  . Hypogonadotropic hypogonadism ([place]) 05/13/2015  . Hyperprolactinemia ([place]) 05/13/2015  . Elevated liver function tests 06/28/2014  . Uncontrolled type 2 diabetes mellitus with nephropathy ([place]) 05/31/2014  . Family history of premature CAD 02/11/2014  . DOE (dyspnea on exertion) 02/11/2014  . Hypersomnia 11/23/2011  . Anxiety 11/05/2011  . Depression 08/23/2011  . Chronic pain 06/27/2011  . Narcotic dependence ([place]) 11/16/2010  . Generalized anxiety disorder 07/26/2010  . Hypothyroidism 01/17/2010  . HERPES LABIALIS 11/03/2009  . Pure hypercholesterolemia 05/26/2009  . Fatigue 03/30/2009  . TOBACCO ABUSE 12/30/2008  . HLD (hyperlipidemia) 12/04/2006  . GOUT 12/04/2006  . INSOMNIA 12/04/2006    Social History   Tobacco Use  . Smoking status: Never Smoker  . Smokeless tobacco: Current User    Types: Snuff  Substance Use Topics  . Alcohol use: No    Comment: quir 2009    Current Outpatient Medications:  .  aspirin 81 MG tablet, Take 81 mg by mouth daily.  , Disp: , Rfl:  .  BD INSULIN SYRINGE U/F 31G X 5/16" 0.5 ML MISC, USE 3 TIMES DAILY, Disp: 100 each, Rfl: 0 .  Blood Glucose Monitoring Suppl (ONE TOUCH ULTRA MINI) W/DEVICE KIT, Use as directed to test blood sugar twice daily 250.00, Disp: 1 each, Rfl: 0 .  buPROPion [facility]  XL) 150 MG 24 hr tablet, TAKE 1 TABLET BY MOUTH EVERY DAY WITH 300MG TO EQUAL 450MG, Disp: 30 tablet, Rfl: 5 .  buPROPion (WELLBUTRIN XL) 300 MG 24 hr tablet, Take 1 tablet (300 mg total) by mouth daily. Take with 1qd of  148m to equal 4531mtotal, Disp: 90 tablet, Rfl: 1 .  fenofibrate 160 MG tablet, TAKE 1 TABLET BY MOUTH EVERY DAY, Disp: 30 tablet, Rfl: 5 .  fluticasone (FLONASE) 50 MCG/ACT nasal spray, SPRAY 2 SPRAYS INTO EACH NOSTRIL EVERY DAY, Disp: 16 g, Rfl: 5 .  Insulin Degludec (TRESIBA FLEXTOUCH) 200 UNIT/ML SOPN, Inject 80 Units into the skin daily., Disp: 9 pen, Rfl: 11 .  insulin lispro (HUMALOG KWIKPEN) 100 UNIT/ML KwikPen, Inject 10-15 units 3 times a day with meals., Disp: 30 mL, Rfl: 4 .  Insulin Pen Needle 32G X 4 MM MISC, Use to inject insulin 1 time daily as instructed., Disp: 200 each, Rfl: 2 .  Insulin Syringe-Needle U-100 25G X 1" 1 ML MISC, USE TO INJECT TESTOSTERONE WEEKLY, Disp: 4 each, Rfl: PRN .  JARDIANCE 25 MG TABS tablet, TAKE 1 TABLET BY MOUTH EVERY DAY, Disp: 30 tablet, Rfl: 2 .  metFORMIN (GLUCOPHAGE) 1000 MG tablet, TAKE 1 TABLET (1,000 MG TOTAL) BY MOUTH 2 (TWO) TIMES DAILY WITH A MEAL., Disp: 60 tablet, Rfl: 2 .  methadone (DOLOPHINE) 10 MG/ML solution, Take 180 mg by mouth daily. , Disp: , Rfl:  .  Multiple Vitamin (MULTIVITAMIN) tablet, Take 2 tablets by mouth 2 (two) times daily., Disp: , Rfl:  .  Multiple Vitamins-Minerals (MEGA MULTI MEN PO), Take by mouth., Disp: , Rfl:  .  mupirocin ointment (BACTROBAN) 2 %, Place 1 application into the nose 2 (two) times daily., Disp: 22 g, Rfl: 0 .  ONE TOUCH ULTRA TEST test strip, USE 8 TIMES A DAY AS INSTRUCTED, Disp: 300 each, Rfl: 4 .  ONETOUCH DELICA LANCETS 3367EISC, USE TO TEST BLOOD SUGAR 4 TIMES DAILY AS INSTRUCTED., Disp: 200 each, Rfl: 3 .  OZEMPIC, 1 MG/DOSE, 2 MG/1.5ML SOPN, INJECT 1 MG INTO THE SKIN ONCE A WEEK., Disp: 3 pen, Rfl: 4 .  PARoxetine (PAXIL) 30 MG tablet, Take 1 tablet (30 mg total) by mouth daily., Disp: 30 tablet, Rfl: 5 .  rosuvastatin (CRESTOR) 10 MG tablet, Take 1qd (must have fasting lab visit for anymore fills), Disp: 15 tablet, Rfl: 0 .  sildenafil (VIAGRA) 100 MG tablet, Take 0.5-1 tablets (50-100 mg  total) by mouth daily as needed for erectile dysfunction., Disp: 5 tablet, Rfl: 11 .  SYRINGE-NEEDLE, DISP, 3 ML (B-D 3CC LUER-LOK SYR 18GX1-1/2) 18G X 1-1/2" 3 ML MISC, USE TO DRAW UP TESTOSTERONE, Disp: 1 each, Rfl: 1 .  Syringe/Needle, Disp, 18G X 1" 1 ML MISC, Use to draw up testosterone, Disp: 100 each, Rfl: 5 .  testosterone cypionate (DEPOTESTOSTERONE CYPIONATE) 200 MG/ML injection, INJECT 0.25MLS INTO THE MUSCLE ONCE WEEKLY, Disp: 1 mL, Rfl: 1 .  zolpidem (AMBIEN) 10 MG tablet, Take 10 mg by mouth at bedtime as needed for sleep., Disp: , Rfl:  .  doxycycline (VIBRA-TABS) 100 MG tablet, Take 1 tablet (100 mg total) by mouth 2 (two) times daily. (Patient not taking: Reported on 05/14/2019), Disp: 20 tablet, Rfl: 0  Allergies  Allergen Reactions  . Gemfibrozil Diarrhea    Objective:   VITALS: Per patient if applicable, see vitals. GENERAL: Alert, appears well and in no acute distress. HEENT: Atraumatic, conjunctiva clear, no obvious abnormalities on inspection of  external nose and ears. NECK: Normal movements of the head and neck. CARDIOPULMONARY: No increased WOB. Speaking in clear sentences. I:E ratio WNL.  MS: Moves all visible extremities without noticeable abnormality. PSYCH: Pleasant and cooperative, well-groomed. Speech normal rate and rhythm. Affect is appropriate. Insight and judgement are appropriate. Attention is focused, linear, and appropriate.  NEURO: CN grossly intact. Oriented as arrived to appointment on time with no prompting. Moves both UE equally.  SKIN: No obvious lesions, wounds, erythema, or cyanosis noted on face or hands.  Depression screen [facility] 2/9 12/11/2017 11/11/2017  Decreased Interest 2 3  Down, Depressed, Hopeless 3 3  PHQ - 2 Score 5 6  Altered sleeping 3 3  Tired, decreased energy 3 3  Change in appetite 3 3  Feeling bad or failure about yourself  2 0  Trouble concentrating 3 3  Moving slowly or fidgety/restless 2 0  Suicidal thoughts 0 0  PHQ-9  Score 21 18  Difficult doing work/chores Very difficult Extremely dIfficult  Some recent data might be hidden    Assessment and Plan:   There are no diagnoses linked to this encounter.  Marland Kitchen COVID-19 Education: The signs and symptoms of COVID-19 were discussed with the patient and how to seek care for testing if needed. The importance of social distancing was discussed today. . Reviewed expectations re: course of current medical issues. . Discussed self-management of symptoms. . Outlined signs and symptoms indicating need for more acute intervention. . Patient verbalized understanding and all questions were answered. Marland Kitchen Health Maintenance issues including appropriate healthy diet, exercise, and smoking avoidance were discussed with patient. . See orders for this visit as documented in the electronic medical record.  [name], MD  Records requested if needed. Time spent: 15 minutes, of which >50% was spent in obtaining information about his symptoms, reviewing his previous labs, evaluations, and treatments, counseling him about his condition (please see the discussed topics above), and developing a plan to further investigate it; he had a number of questions which I addressed.

## 2019-05-16 LAB — NOVEL CORONAVIRUS, NAA: SARS-CoV-2, NAA: NOT DETECTED

## 2019-06-17 NOTE — Progress Notes (Signed)
PATIENT: Martin Downs DOB: [birth year]  REASON FOR VISIT: follow up HISTORY FROM: patient  Chief Complaint  Patient presents with  . Follow-up    initial CPAP; not doing well. says he "can't do it" "I spaz out"  . Room 1    here alone has mask on     HISTORY OF PRESENT ILLNESS: Today 06/18/19 Martin Downs is a 53 y.o. male here today for follow up for severe OSA on CPAP. He was last seen in 05/2018. Sleep study in early 2020 revelaed severe OSA with hypoxemia. CPAP therapy was initiated in 04/2019. He returns for initial compliance visit. He reports that he is unable to tolerate therapy. He tried one day but can not tolerate having the mask on his face. He is using full face mask but is adamant that he can not tolerate any of the masks. He started with nasal pillow, then tried nasal mask. He has significant anxiety and is claustrophobic. He is adamant that he can not and will not comply with CPAP therapy.    HISTORY: (copied from Dr Dohmeier's note on 06/23/2018)  HPI:  Martin Downs is a 53 y.o. male patient is seen on 06-23-2018  in a referral from Dr. Deborra Medina for insomnia and hypersomnia, in the setting of anxiety and opiate addiction.   Chief complaint according to patient : I have the pleasure of meeting Martin Downs today a 53 year old Caucasian gentleman with difficulties to sleep for over a decade.  He has been told that he snores he does wake up with palpitations or diaphoresis, often in daytime he and is not even sure how long he may have been sleeping.  It is not difficult for him at night to go to sleep but is staying asleep.  He also states that he has some other comorbidities that may affect his sleep.  Currently he may be staying asleep for 90 minutes before he wakes up again he is awake for several hours before he may take another 90 minutes of sleep and he feels severely sleep deprived.   Sleep habits are as follows: His workday ends at 3:30 PM, and  he is usually at home by 4 PM, dinnertime is between 6 and 7 PM, and his bedtime is 11 PM.  He goes to bed at the same time that his spouse does, he has noted that going earlier just shortens his nighttime sleep further.  He does not get more hours by going to bed earlier.  The bedroom is cool, quiet and dark without TV.  He prefers to sleep on his right side, and uses only one pillow for head and neck support. He cannot recall dreaming, he has no nocturia.  He wakes up diaphoretic - he seems to sweat more on the right side of the face and body.He rises at 5.45 AM.  On weekends he doesn't really stop sleeping, has a complete different rhythm, naps 90 minutes over the day, again and again.    Sleep medical history: The patient has a history of diabetes treated with Glucophage, Jardiance and insulin.  He is taking Flonase for nasal allergies, bupropion XL form and 150 mg XL form for a total of 450 mg daily is taken in the morning.  Also taking Ozempic, he is on Paxil 30 mg, Crestor, Viagra and testosterone injections.  Social history:  Married, with 15 and 12 year old children.  He works for [employer], [employer].  Non  smoker, He is using tobacco in form of snuff 1/2 can daily. ETOH- on weekends, never daily, "no addiction"- but reports binging in the past. Caffeine - none.     REVIEW OF SYSTEMS: Out of a complete 14 system review of symptoms, the patient complains only of the following symptoms, anxiety and all other reviewed systems are negative.  ALLERGIES: Allergies  Allergen Reactions  . Gemfibrozil Diarrhea    HOME MEDICATIONS: Outpatient Medications Prior to Visit  Medication Sig Dispense Refill  . aspirin 81 MG tablet Take 81 mg by mouth daily.      . BD INSULIN SYRINGE U/F 31G X 5/16" 0.5 ML MISC USE 3 TIMES DAILY 100 each 0  . Blood Glucose Monitoring Suppl (ONE TOUCH ULTRA MINI) W/DEVICE KIT Use as directed to test blood sugar twice daily 250.00 1 each 0  .  fenofibrate 160 MG tablet TAKE 1 TABLET BY MOUTH EVERY DAY 30 tablet 5  . fluticasone (FLONASE) 50 MCG/ACT nasal spray SPRAY 2 SPRAYS INTO EACH NOSTRIL EVERY DAY 16 g 5  . Insulin Degludec (TRESIBA FLEXTOUCH) 200 UNIT/ML SOPN Inject 80 Units into the skin daily. 9 pen 11  . insulin lispro (HUMALOG KWIKPEN) 100 UNIT/ML KwikPen Inject 10-15 units 3 times a day with meals. 30 mL 4  . Insulin Pen Needle 32G X 4 MM MISC Use to inject insulin 1 time daily as instructed. 200 each 2  . Insulin Syringe-Needle U-100 25G X 1" 1 ML MISC USE TO INJECT TESTOSTERONE WEEKLY 4 each PRN  . JARDIANCE 25 MG TABS tablet TAKE 1 TABLET BY MOUTH EVERY DAY 30 tablet 2  . metFORMIN (GLUCOPHAGE) 1000 MG tablet TAKE 1 TABLET (1,000 MG TOTAL) BY MOUTH 2 (TWO) TIMES DAILY WITH A MEAL. 60 tablet 2  . methadone (DOLOPHINE) 10 MG/ML solution Take 180 mg by mouth daily.     . Multiple Vitamin (MULTIVITAMIN) tablet Take 2 tablets by mouth 2 (two) times daily.    . Multiple Vitamins-Minerals (MEGA MULTI MEN PO) Take by mouth.    . mupirocin ointment (BACTROBAN) 2 % Place 1 application into the nose 2 (two) times daily. 22 g 0  . ONE TOUCH ULTRA TEST test strip USE 8 TIMES A DAY AS INSTRUCTED 300 each 4  . ONETOUCH DELICA LANCETS 97D MISC USE TO TEST BLOOD SUGAR 4 TIMES DAILY AS INSTRUCTED. 200 each 3  . OZEMPIC, 1 MG/DOSE, 2 MG/1.5ML SOPN INJECT 1 MG INTO THE SKIN ONCE A WEEK. 3 pen 4  . rosuvastatin (CRESTOR) 10 MG tablet Take 1qd (must have fasting lab visit for anymore fills) 15 tablet 0  . SYRINGE-NEEDLE, DISP, 3 ML (B-D 3CC LUER-LOK SYR 18GX1-1/2) 18G X 1-1/2" 3 ML MISC USE TO DRAW UP TESTOSTERONE 1 each 1  . Syringe/Needle, Disp, 18G X 1" 1 ML MISC Use to draw up testosterone 100 each 5  . testosterone cypionate (DEPOTESTOSTERONE CYPIONATE) 200 MG/ML injection INJECT 0.25MLS INTO THE MUSCLE ONCE WEEKLY 1 mL 1  . buPROPion (WELLBUTRIN XL) 150 MG 24 hr tablet TAKE 1 TABLET BY MOUTH EVERY DAY WITH 300MG TO EQUAL 450MG (Patient  not taking: Reported on 06/18/2019) 30 tablet 5  . buPROPion (WELLBUTRIN XL) 300 MG 24 hr tablet Take 1 tablet (300 mg total) by mouth daily. Take with 1qd of 122m to equal 4532mtotal (Patient not taking: Reported on 06/18/2019) 90 tablet 1  . PARoxetine (PAXIL) 30 MG tablet Take 1 tablet (30 mg total) by mouth daily. (Patient not taking: Reported on  06/18/2019) 30 tablet 5  . sildenafil (VIAGRA) 100 MG tablet Take 0.5-1 tablets (50-100 mg total) by mouth daily as needed for erectile dysfunction. 5 tablet 11  . zolpidem (AMBIEN) 10 MG tablet Take 10 mg by mouth at bedtime as needed for sleep.     No facility-administered medications prior to visit.     PAST MEDICAL HISTORY: Past Medical History:  Diagnosis Date  . Anxiety   . Borderline diabetes   . Chronic pain   . Depression   . Diabetes mellitus without complication ([place])   . Hyperlipidemia   . Narcotic dependence ([place])   . Sleep apnea    no cpap    PAST SURGICAL HISTORY: Past Surgical History:  Procedure Laterality Date  . CHOLECYSTECTOMY N/A 05/30/2016   Procedure: LAPAROSCOPIC CHOLECYSTECTOMY WITH INTRAOPERATIVE CHOLANGIOGRAM;  Surgeon: Donnie Mesa, MD;  Location: [facility];  Service: General;  Laterality: N/A;  . CHOLECYSTECTOMY N/A 05/30/2016   Procedure: OPEN COMMON BILE DUCT EXPLORATION; INSERTION OF T-TUBE;  Surgeon: Donnie Mesa, MD;  Location: [facility];  Service: General;  Laterality: N/A;  . removal of bone spur Bilateral 1995   elbps  . TOOTH EXTRACTION  2017  . UMBILICAL HERNIA REPAIR N/A 05/30/2016   Procedure: HERNIA REPAIR UMBILICAL ADULT;  Surgeon: Donnie Mesa, MD;  Location: [facility];  Service: General;  Laterality: N/A;    FAMILY HISTORY: Family History  Problem Relation Age of Onset  . Heart disease Father   . Cancer Father   . Colon cancer Neg Hx     SOCIAL HISTORY: Social History   Socioeconomic History  . Marital status: Married    Spouse name: Not on file  . Number of children: 2  . Years of  education: Not on file  . Highest education level: High school graduate  Occupational History  . Occupation: Music therapist: UNEMPLOYED  Social Needs  . Financial resource strain: Not on file  . Food insecurity    Worry: Not on file    Inability: Not on file  . Transportation needs    Medical: Not on file    Non-medical: Not on file  Tobacco Use  . Smoking status: Never Smoker  . Smokeless tobacco: Current User    Types: Snuff  Substance and Sexual Activity  . Alcohol use: No    Comment: quir 2009  . Drug use: Not Currently  . Sexual activity: Not on file  Lifestyle  . Physical activity    Days per week: Not on file    Minutes per session: Not on file  . Stress: Not on file  Relationships  . Social Herbalist on phone: Not on file    Gets together: Not on file    Attends religious service: Not on file    Active member of club or organization: Not on file    Attends meetings of clubs or organizations: Not on file    Relationship status: Not on file  . Intimate partner violence    Fear of current or ex partner: Not on file    Emotionally abused: Not on file    Physically abused: Not on file    Forced sexual activity: Not on file  Other Topics Concern  . Not on file  Social History Narrative   Lives at home with wife   Right handed   Caffeine: "maybe a soda during lunch or something"      PHYSICAL EXAM  Vitals:   06/18/19  0741  BP: 140/76  Pulse: 70  Temp: (!) 97.3 F (36.3 C)  Weight: 217 lb (98.4 kg)  Height: 5' 9" (1.753 m)   Body mass index is 32.05 kg/m.  Generalized: Well developed, in no acute distress  Cardiology: normal rate and rhythm, no murmur noted Respiratory: Clear to auscultation bilaterally Poor dentition, Mallampati 5+, neck circ 18.75 Neurological examination  Mentation: Alert oriented to time, place, history taking. Follows all commands speech and language fluent Cranial nerve II-XII: Pupils were equal round  reactive to light. Extraocular movements were full, visual field were full on confrontational test. Facial sensation and strength were normal. Uvula tongue midline. Head turning and shoulder shrug  were normal and symmetric. Motor: The motor testing reveals 5 over 5 strength of all 4 extremities. Good symmetric motor tone is noted throughout.  Gait and station: Gait is normal.   DIAGNOSTIC DATA (LABS, IMAGING, TESTING) - I reviewed patient records, labs, notes, testing and imaging myself where available.  No flowsheet data found.   Lab Results  Component Value Date   WBC 10.4 11/11/2017   HGB 16.2 11/11/2017   HCT 48.8 11/11/2017   MCV 94.3 11/11/2017   PLT 196.0 11/11/2017      Component Value Date/Time   NA 139 12/11/2017 1509   K 4.6 12/11/2017 1509   CL 101 12/11/2017 1509   CO2 31 12/11/2017 1509   GLUCOSE 159 (H) 12/11/2017 1509   BUN 13 12/11/2017 1509   CREATININE 1.26 12/11/2017 1509   CALCIUM 9.5 12/11/2017 1509   PROT 6.5 12/11/2017 1509   ALBUMIN 3.0 (L) 06/08/2016 0432   AST 27 12/11/2017 1509   ALT 37 12/11/2017 1509   ALKPHOS 55 06/08/2016 0432   BILITOT 0.8 12/11/2017 1509   GFRNONAA 65 12/11/2017 1509   GFRAA 75 12/11/2017 1509   Lab Results  Component Value Date   CHOL 104 04/30/2017   HDL 32 (L) 04/30/2017   LDLCALC 31 04/30/2017   LDLDIRECT 136.0 11/01/2014   TRIG 204 (H) 04/30/2017   CHOLHDL 3.3 04/30/2017   Lab Results  Component Value Date   HGBA1C 8.3 (A) 09/25/2018   Lab Results  Component Value Date   VITAMINB12 317 06/27/2011   Lab Results  Component Value Date   TSH 2.79 11/11/2017       ASSESSMENT AND PLAN 53 y.o. year old male  has a past medical history of Anxiety, Borderline diabetes, Chronic pain, Depression, Diabetes mellitus without complication ([place]), Hyperlipidemia, Narcotic dependence ([place]), and Sleep apnea. here with     ICD-10-CM   1. OSA on CPAP  G47.33    Z99.89   2. Complex sleep apnea syndrome  G47.31      Mr. Flynt is unable to tolerate CPAP therapy.  He is adamant that he is not going to be able to tolerate any form of therapy for his nose or mouth is covered.  He does have a history of significant anxiety.  He reports weaning off of Wellbutrin and Paxil several months ago.  He has not followed by psychiatry.  Epworth Sleepiness Scale remains elevated at 24.  Compliance report reveals 7 minutes of usage over the last 30 days.  Initial sleep study showed predominant obstructive sleep apnea, however, central apneas were noted.  This is most likely related to chronic opioid use.  He continues methadone 180 mg daily.  We have discussed risk of untreated sleep apnea.  Due to the nature of his apnea, CPAP therapy is by far  our best option.  He is adamant that he cannot continue CPAP therapy.  I do not feel that he would be appropriate for an oral appliance.  I do feel that a consult with ENT for consideration of the inspire device may be beneficial in treating the obstructive component of his apnea.  I will place an order today.  He is in agreement with this plan.  We will follow-up pending consult for inspire.  He verbalizes understanding and agreement with this plan.   No orders of the defined types were placed in this encounter.    No orders of the defined types were placed in this encounter.       Debbora Presto, FNP-C 06/18/2019, 9:52 AM [facility] [address] [place], [place] [address] [phone]

## 2019-06-17 NOTE — Patient Instructions (Addendum)
We will refer you to ENT for consideration of Inspire insertion.   Please follow up pending the consult   Sleep Apnea Sleep apnea affects breathing during sleep. It causes breathing to stop for a short time or to become shallow. It can also increase the risk of:  Heart attack.  Stroke.  Being very overweight (obese).  Diabetes.  Heart failure.  Irregular heartbeat. The goal of treatment is to help you breathe normally again. What are the causes? There are three kinds of sleep apnea:  Obstructive sleep apnea. This is caused by a blocked or collapsed airway.  Central sleep apnea. This happens when the brain does not send the right signals to the muscles that control breathing.  Mixed sleep apnea. This is a combination of obstructive and central sleep apnea. The most common cause of this condition is a collapsed or blocked airway. This can happen if:  Your throat muscles are too relaxed.  Your tongue and tonsils are too large.  You are overweight.  Your airway is too small. What increases the risk?  Being overweight.  Smoking.  Having a small airway.  Being older.  Being male.  Drinking alcohol.  Taking medicines to calm yourself (sedatives or tranquilizers).  Having family members with the condition. What are the signs or symptoms?  Trouble staying asleep.  Being sleepy or tired during the day.  Getting angry a lot.  Loud snoring.  Headaches in the morning.  Not being able to focus your mind (concentrate).  Forgetting things.  Less interest in sex.  Mood swings.  Personality changes.  Feelings of sadness (depression).  Waking up a lot during the night to pee (urinate).  Dry mouth.  Sore throat. How is this diagnosed?  Your medical history.  A physical exam.  A test that is done when you are sleeping (sleep study). The test is most often done in a sleep lab but may also be done at home. How is this treated?   Sleeping on your  side.  Using a medicine to get rid of mucus in your nose (decongestant).  Avoiding the use of alcohol, medicines to help you relax, or certain pain medicines (narcotics).  Losing weight, if needed.  Changing your diet.  Not smoking.  Using a machine to open your airway while you sleep, such as: ? An oral appliance. This is a mouthpiece that shifts your lower jaw forward. ? A CPAP device. This device blows air through a mask when you breathe out (exhale). ? An EPAP device. This has valves that you put in each nostril. ? A BPAP device. This device blows air through a mask when you breathe in (inhale) and breathe out.  Having surgery if other treatments do not work. It is important to get treatment for sleep apnea. Without treatment, it can lead to:  High blood pressure.  Coronary artery disease.  In men, not being able to have an erection (impotence).  Reduced thinking ability. Follow these instructions at home: Lifestyle  Make changes that your doctor recommends.  Eat a healthy diet.  Lose weight if needed.  Avoid alcohol, medicines to help you relax, and some pain medicines.  Do not use any products that contain nicotine or tobacco, such as cigarettes, e-cigarettes, and chewing tobacco. If you need help quitting, ask your doctor. General instructions  Take over-the-counter and prescription medicines only as told by your doctor.  If you were given a machine to use while you sleep, use it only as  told by your doctor.  If you are having surgery, make sure to tell your doctor you have sleep apnea. You may need to bring your device with you.  Keep all follow-up visits as told by your doctor. This is important. Contact a doctor if:  The machine that you were given to use during sleep bothers you or does not seem to be working.  You do not get better.  You get worse. Get help right away if:  Your chest hurts.  You have trouble breathing in enough air.  You have  an uncomfortable feeling in your back, arms, or stomach.  You have trouble talking.  One side of your body feels weak.  A part of your face is hanging down. These symptoms may be an emergency. Do not wait to see if the symptoms will go away. Get medical help right away. Call your local emergency services (911 in the [place] Do not drive yourself to the hospital. Summary  This condition affects breathing during sleep.  The most common cause is a collapsed or blocked airway.  The goal of treatment is to help you breathe normally while you sleep. This information is not intended to replace advice given to you by your health care provider. Make sure you discuss any questions you have with your health care provider. Document Released: 04/24/2008 Document Revised: 05/02/2018 Document Reviewed: 03/11/2018 Elsevier Patient Education  2020 [employer].

## 2019-06-18 ENCOUNTER — Other Ambulatory Visit: Payer: Self-pay

## 2019-06-18 ENCOUNTER — Encounter: Payer: Self-pay | Admitting: Family Medicine

## 2019-06-18 ENCOUNTER — Ambulatory Visit: Payer: 59 | Admitting: Family Medicine

## 2019-06-18 VITALS — BP 140/76 | HR 70 | Temp 97.3°F | Ht 69.0 in | Wt 217.0 lb

## 2019-06-18 DIAGNOSIS — G4731 Primary central sleep apnea: Secondary | ICD-10-CM | POA: Diagnosis not present

## 2019-06-18 DIAGNOSIS — G4733 Obstructive sleep apnea (adult) (pediatric): Secondary | ICD-10-CM | POA: Diagnosis not present

## 2019-06-18 DIAGNOSIS — Z9989 Dependence on other enabling machines and devices: Secondary | ICD-10-CM

## 2019-07-03 ENCOUNTER — Telehealth: Payer: Self-pay | Admitting: Family Medicine

## 2019-07-03 NOTE — Telephone Encounter (Signed)
Please advise 

## 2019-07-03 NOTE — Telephone Encounter (Signed)
I can't just send another rx in without talking to him.  Please schedule a virtual visit.

## 2019-07-03 NOTE — Telephone Encounter (Signed)
Patient is calling to see if Dr. Deborra Medina can change his buPROPion (WELLBUTRIN XL) 150 MG 24 hr tablet [[id number]] DISCONTINUED Patient states that it is not working. Patient denied wanting appt to discuss with Dr. Deborra Medina. Please advise. CB- [phone] Patient is able communicate via MyChart as well. Preferred Pharmacy[facility] [place], [place].

## 2019-07-06 NOTE — Telephone Encounter (Signed)
Patient scheduled for Thursday

## 2019-07-08 ENCOUNTER — Other Ambulatory Visit: Payer: Self-pay

## 2019-07-08 ENCOUNTER — Other Ambulatory Visit (INDEPENDENT_AMBULATORY_CARE_PROVIDER_SITE_OTHER): Payer: 59

## 2019-07-08 DIAGNOSIS — IMO0002 Reserved for concepts with insufficient information to code with codable children: Secondary | ICD-10-CM

## 2019-07-08 DIAGNOSIS — E230 Hypopituitarism: Secondary | ICD-10-CM | POA: Diagnosis not present

## 2019-07-08 DIAGNOSIS — E1165 Type 2 diabetes mellitus with hyperglycemia: Secondary | ICD-10-CM

## 2019-07-08 DIAGNOSIS — R7989 Other specified abnormal findings of blood chemistry: Secondary | ICD-10-CM

## 2019-07-08 NOTE — Progress Notes (Signed)
Virtual Visit via Video   Due to the COVID-19 pandemic, this visit was completed with telemedicine (audio/video) technology to reduce patient and provider exposure as well as to preserve personal protective equipment.   I connected with Martin Downs by a video enabled telemedicine application and verified that I am speaking with the correct person using two identifiers. Location patient: Home Location provider: [facility], Office Persons participating in the virtual visit: Lebron, Nauert, MD   I discussed the limitations of evaluation and management by telemedicine and the availability of in person appointments. The patient expressed understanding and agreed to proceed.  Care Team   Patient Care Team: Lucille Passy, MD as PCP - General  Subjective:   Depression-  Patient called and left the following message with triage nurse:  "Patient is calling to see if Dr. Deborra Medina can change his buPROPion (WELLBUTRIN XL) 150 MG 24 hr tablet [[id number]] DISCONTINUED Patient states that it is not working. Patient denied wanting appt to discuss with Dr. Deborra Medina. Please advise. CB- [phone] Patient is able communicate via MyChart as well. [facility] [address] [place], [place]."  Does not appear he is taking anything for depression currently.  Last saw him on 12/11/17 for depression.  Note reviewed:  When I saw him on 11/11/17, he felt Paxil 30 mg was not controlled his depression.  PHQ was 18 at the time.  Started Wellbutrin 150 mg XL daily but declined psych referral.  Also started Trazodone 50 nightly which has not helped with insomnia.  On 12/11/17 he did report that the wellbutrin has helped some- "I don't want to lay around all day as often."  We therefore increassed hisTrazodone to 150 mg nightly and Wellbutrin to 300 mg XL daily.  He then oddly called on 12/16/17 asking why his refill request for paxil was declined- we refilled it at that time.  We  also increased Wellbutrin to 450 mg XL daily in 10/2018.  Ended up backing off and taking the 300 mg dosage because higher dosage was causing jerking.   Depression screen [facility] 2/9 07/08/2019 12/11/2017 11/11/2017  Decreased Interest '3 2 3  '$ Down, Depressed, Hopeless '3 3 3  '$ PHQ - 2 Score '6 5 6  '$ Altered sleeping '3 3 3  '$ Tired, decreased energy '3 3 3  '$ Change in appetite '1 3 3  '$ Feeling bad or failure about yourself  2 2 0  Trouble concentrating '3 3 3  '$ Moving slowly or fidgety/restless 1 2 0  Suicidal thoughts 1 0 0  PHQ-9 Score '20 21 18  '$ Difficult doing work/chores Very difficult Very difficult Extremely dIfficult  Some recent data might be hidden   GAD 7 : Generalized Anxiety Score 07/08/2019 12/11/2017 11/11/2017  Nervous, Anxious, on Edge 0 2 0  Control/stop worrying 1 2 0  Worry too much - different things 1 3 0  Trouble relaxing '1 3 3  '$ Restless 0 0 0  Easily annoyed or irritable '3 2 3  '$ Afraid - awful might happen 0 3 3  Total GAD 7 Score '6 15 9  '$ Anxiety Difficulty Somewhat difficult Very difficult Somewhat difficult      Review of Systems  Constitutional: Negative for fever and malaise/fatigue.  HENT: Negative for congestion and hearing loss.   Eyes: Negative for blurred vision, discharge and redness.  Respiratory: Negative for cough and shortness of breath.   Cardiovascular: Negative for chest pain, palpitations and leg swelling.  Gastrointestinal: Negative for abdominal pain and heartburn.  Genitourinary: Negative for dysuria.  Musculoskeletal: Negative for falls.  Skin: Negative for rash.  Neurological: Negative for loss of consciousness and headaches.  Endo/Heme/Allergies: Does not bruise/bleed easily.  Psychiatric/Behavioral: Positive for depression. Negative for hallucinations, memory loss, substance abuse and suicidal ideas. The patient is nervous/anxious and has insomnia.      Patient Active Problem List   Diagnosis Date Noted  . Suspected COVID-19 virus infection  05/14/2019  . Acute frontal sinusitis 09/11/2018  . Bronchitis 09/11/2018  . Complex sleep apnea syndrome 09/03/2018  . Chronically on opiate therapy 06/23/2018  . Heart palpitations 06/23/2018  . Unexplained night sweats 06/23/2018  . Other insomnia 06/23/2018  . Sleep apnea 06/23/2018  . Hypersomnia, persistent 06/23/2018  . Paronychia of thumb 04/18/2018  . Encounter for long-term methadone use 02/19/2018  . Umbilical hernia 28/78/6767  . Acute gangrenous cholecystitis s/p cholecystectomy 05/30/2016 05/31/2016  . Common bile duct repair with T-Tube 11/192017 05/31/2016  . Cholecystitis 05/30/2016  . Erectile dysfunction 03/13/2016  . Premature ejaculation 03/13/2016  . Hypogonadotropic hypogonadism ([place]) 05/13/2015  . Hyperprolactinemia ([place]) 05/13/2015  . Elevated liver function tests 06/28/2014  . Uncontrolled type 2 diabetes mellitus with nephropathy ([place]) 05/31/2014  . Family history of premature CAD 02/11/2014  . DOE (dyspnea on exertion) 02/11/2014  . Hypersomnia 11/23/2011  . Anxiety 11/05/2011  . Depression 08/23/2011  . Chronic pain 06/27/2011  . Narcotic dependence ([place]) 11/16/2010  . Generalized anxiety disorder 07/26/2010  . Hypothyroidism 01/17/2010  . HERPES LABIALIS 11/03/2009  . Pure hypercholesterolemia 05/26/2009  . Fatigue 03/30/2009  . TOBACCO ABUSE 12/30/2008  . HLD (hyperlipidemia) 12/04/2006  . GOUT 12/04/2006  . INSOMNIA 12/04/2006    Social History   Tobacco Use  . Smoking status: Never Smoker  . Smokeless tobacco: Current User    Types: Snuff  Substance Use Topics  . Alcohol use: No    Comment: quir 2009    Current Outpatient Medications:  .  aspirin 81 MG tablet, Take 81 mg by mouth daily.  , Disp: , Rfl:  .  BD INSULIN SYRINGE U/F 31G X 5/16" 0.5 ML MISC, USE 3 TIMES DAILY, Disp: 100 each, Rfl: 0 .  Blood Glucose Monitoring Suppl (ONE TOUCH ULTRA MINI) W/DEVICE KIT, Use as directed to test blood sugar twice daily 250.00, Disp: 1 each,  Rfl: 0 .  fenofibrate 160 MG tablet, TAKE 1 TABLET BY MOUTH EVERY DAY, Disp: 30 tablet, Rfl: 5 .  fluticasone (FLONASE) 50 MCG/ACT nasal spray, SPRAY 2 SPRAYS INTO EACH NOSTRIL EVERY DAY, Disp: 16 g, Rfl: 5 .  Insulin Pen Needle 32G X 4 MM MISC, Use to inject insulin 1 time daily as instructed., Disp: 200 each, Rfl: 2 .  Insulin Syringe-Needle U-100 25G X 1" 1 ML MISC, USE TO INJECT TESTOSTERONE WEEKLY, Disp: 4 each, Rfl: PRN .  JARDIANCE 25 MG TABS tablet, TAKE 1 TABLET BY MOUTH EVERY DAY, Disp: 30 tablet, Rfl: 2 .  metFORMIN (GLUCOPHAGE) 1000 MG tablet, TAKE 1 TABLET (1,000 MG TOTAL) BY MOUTH 2 (TWO) TIMES DAILY WITH A MEAL., Disp: 60 tablet, Rfl: 2 .  methadone (DOLOPHINE) 10 MG/ML solution, Take 180 mg by mouth daily. , Disp: , Rfl:  .  Multiple Vitamin (MULTIVITAMIN) tablet, Take 2 tablets by mouth 2 (two) times daily., Disp: , Rfl:  .  ONE TOUCH ULTRA TEST test strip, USE 8 TIMES A DAY AS INSTRUCTED, Disp: 300 each, Rfl: 4 .  ONETOUCH DELICA LANCETS 20N MISC, USE TO TEST BLOOD SUGAR 4 TIMES  DAILY AS INSTRUCTED., Disp: 200 each, Rfl: 3 .  rosuvastatin (CRESTOR) 10 MG tablet, Take 1qd (must have fasting lab visit for anymore fills), Disp: 15 tablet, Rfl: 0 .  SYRINGE-NEEDLE, DISP, 3 ML (B-D 3CC LUER-LOK SYR 18GX1-1/2) 18G X 1-1/2" 3 ML MISC, USE TO DRAW UP TESTOSTERONE, Disp: 1 each, Rfl: 1 .  Syringe/Needle, Disp, 18G X 1" 1 ML MISC, Use to draw up testosterone, Disp: 100 each, Rfl: 5 .  testosterone cypionate (DEPOTESTOSTERONE CYPIONATE) 200 MG/ML injection, INJECT 0.25MLS INTO THE MUSCLE ONCE WEEKLY, Disp: 1 mL, Rfl: 1  Allergies  Allergen Reactions  . Gemfibrozil Diarrhea    Objective:  There were no vitals taken for this visit.  VITALS: Per patient if applicable, see vitals. GENERAL: Alert, appears well and in no acute distress. HEENT: Atraumatic, conjunctiva clear, no obvious abnormalities on inspection of external nose and ears. NECK: Normal movements of the head and neck.  CARDIOPULMONARY: No increased WOB. Speaking in clear sentences. I:E ratio WNL.  MS: Moves all visible extremities without noticeable abnormality. PSYCH: Pleasant and cooperative, well-groomed. Speech normal rate and rhythm. Affect is appropriate. Insight and judgement are appropriate. Attention is focused, linear, and appropriate.  NEURO: CN grossly intact. Oriented as arrived to appointment on time with no prompting. Moves both UE equally.  SKIN: No obvious lesions, wounds, erythema, or cyanosis noted on face or hands.  Depression screen [facility] 2/9 07/08/2019 12/11/2017 11/11/2017  Decreased Interest '3 2 3  '$ Down, Depressed, Hopeless '3 3 3  '$ PHQ - 2 Score '6 5 6  '$ Altered sleeping '3 3 3  '$ Tired, decreased energy '3 3 3  '$ Change in appetite '1 3 3  '$ Feeling bad or failure about yourself  2 2 0  Trouble concentrating '3 3 3  '$ Moving slowly or fidgety/restless 1 2 0  Suicidal thoughts 1 0 0  PHQ-9 Score '20 21 18  '$ Difficult doing work/chores Very difficult Very difficult Extremely dIfficult  Some recent data might be hidden     . COVID-19 Education: The signs and symptoms of COVID-19 were discussed with the patient and how to seek care for testing if needed. The importance of social distancing was discussed today. . Reviewed expectations re: course of current medical issues. . Discussed self-management of symptoms. . Outlined signs and symptoms indicating need for more acute intervention. . Patient verbalized understanding and all questions were answered. Marland Kitchen Health Maintenance issues including appropriate healthy diet, exercise, and smoking avoidance were discussed with patient. . See orders for this visit as documented in the electronic medical record.  Records requested if needed. Time spent:25 minutes, of which >50% was spent in obtaining information about his symptoms, reviewing his previous labs, evaluations, and treatments, counseling him about his condition (please see the discussed topics above),  and developing a plan to further investigate it; he had a number of questions which I addressed.   Lab Results  Component Value Date   WBC 8.3 07/08/2019   HGB 16.2 07/08/2019   HCT 48.4 07/08/2019   PLT 199 07/08/2019   GLUCOSE 159 (H) 12/11/2017   CHOL 130 07/08/2019   TRIG 252 (H) 07/08/2019   HDL 34 (L) 07/08/2019   LDLDIRECT 136.0 11/01/2014   LDLCALC 56 07/08/2019   ALT 37 12/11/2017   AST 27 12/11/2017   NA 139 12/11/2017   K 4.6 12/11/2017   CL 101 12/11/2017   CREATININE 1.26 12/11/2017   BUN 13 12/11/2017   CO2 31 12/11/2017   TSH 1.670 07/08/2019  PSA 0.69 03/01/2016   INR 1.35 05/30/2016   HGBA1C 10.8 (H) 07/08/2019   MICROALBUR 10 11/11/2017    Lab Results  Component Value Date   TSH 1.670 07/08/2019   Lab Results  Component Value Date   WBC 8.3 07/08/2019   HGB 16.2 07/08/2019   HCT 48.4 07/08/2019   MCV 92 07/08/2019   PLT 199 07/08/2019   Lab Results  Component Value Date   NA 139 12/11/2017   K 4.6 12/11/2017   CO2 31 12/11/2017   GLUCOSE 159 (H) 12/11/2017   BUN 13 12/11/2017   CREATININE 1.26 12/11/2017   BILITOT 0.8 12/11/2017   ALKPHOS 55 06/08/2016   AST 27 12/11/2017   ALT 37 12/11/2017   PROT 6.5 12/11/2017   ALBUMIN 3.0 (L) 06/08/2016   CALCIUM 9.5 12/11/2017   ANIONGAP 8 06/08/2016   GFR 68.94 03/01/2016   Lab Results  Component Value Date   CHOL 130 07/08/2019   Lab Results  Component Value Date   HDL 34 (L) 07/08/2019   Lab Results  Component Value Date   LDLCALC 56 07/08/2019   Lab Results  Component Value Date   TRIG 252 (H) 07/08/2019   Lab Results  Component Value Date   CHOLHDL 3.8 07/08/2019   Lab Results  Component Value Date   HGBA1C 10.8 (H) 07/08/2019       Assessment & Plan:   Problem List Items Addressed This Visit      Active Problems   Depression - Primary    Deteriorated. Says he would never commit suicide.  Discussed treatment options with him.  We agreed to psychiatry referral-  referral placed urgently.      Relevant Orders   Ambulatory referral to Psychiatry    Other Visit Diagnoses    Opioid dependence, uncomplicated (HCC)   (Chronic)        I have discontinued Sonia Side T. Alcala's Multiple Vitamins-Minerals (MEGA MULTI MEN PO), mupirocin ointment, Ozempic (1 MG/DOSE), Tresiba FlexTouch, and insulin lispro. I am also having him maintain his aspirin, methadone, multivitamin, ONE TOUCH ULTRA MINI, ONE TOUCH ULTRA TEST, OneTouch Delica Lancets 82N, Syringe/Needle (Disp), BD Insulin Syringe U/F, testosterone cypionate, Insulin Syringe-Needle U-100, fluticasone, SYRINGE-NEEDLE (DISP) 3 ML, Insulin Pen Needle, Jardiance, fenofibrate, rosuvastatin, and metFORMIN.  No orders of the defined types were placed in this encounter.    [name], MD

## 2019-07-09 ENCOUNTER — Ambulatory Visit (INDEPENDENT_AMBULATORY_CARE_PROVIDER_SITE_OTHER): Payer: 59 | Admitting: Family Medicine

## 2019-07-09 ENCOUNTER — Ambulatory Visit: Payer: 59 | Admitting: Internal Medicine

## 2019-07-09 DIAGNOSIS — F331 Major depressive disorder, recurrent, moderate: Secondary | ICD-10-CM | POA: Diagnosis not present

## 2019-07-09 DIAGNOSIS — F1120 Opioid dependence, uncomplicated: Secondary | ICD-10-CM

## 2019-07-09 NOTE — Assessment & Plan Note (Addendum)
Deteriorated. Says he would never commit suicide.  Discussed treatment options with him.  We agreed to psychiatry referral- referral placed urgently. We agreed to not start him on rx at this time.

## 2019-07-10 ENCOUNTER — Encounter: Payer: Self-pay | Admitting: Family Medicine

## 2019-07-10 DIAGNOSIS — G4733 Obstructive sleep apnea (adult) (pediatric): Secondary | ICD-10-CM

## 2019-07-10 DIAGNOSIS — Z9989 Dependence on other enabling machines and devices: Secondary | ICD-10-CM

## 2019-07-10 DIAGNOSIS — G4731 Primary central sleep apnea: Secondary | ICD-10-CM

## 2019-07-13 ENCOUNTER — Ambulatory Visit: Payer: 59 | Admitting: Family Medicine

## 2019-07-14 ENCOUNTER — Ambulatory Visit (INDEPENDENT_AMBULATORY_CARE_PROVIDER_SITE_OTHER): Payer: 59 | Admitting: Internal Medicine

## 2019-07-14 ENCOUNTER — Other Ambulatory Visit: Payer: Self-pay | Admitting: Family Medicine

## 2019-07-14 ENCOUNTER — Encounter: Payer: Self-pay | Admitting: Internal Medicine

## 2019-07-14 ENCOUNTER — Other Ambulatory Visit: Payer: Self-pay

## 2019-07-14 DIAGNOSIS — E1121 Type 2 diabetes mellitus with diabetic nephropathy: Secondary | ICD-10-CM | POA: Diagnosis not present

## 2019-07-14 DIAGNOSIS — R7989 Other specified abnormal findings of blood chemistry: Secondary | ICD-10-CM

## 2019-07-14 DIAGNOSIS — J0110 Acute frontal sinusitis, unspecified: Secondary | ICD-10-CM

## 2019-07-14 DIAGNOSIS — E039 Hypothyroidism, unspecified: Secondary | ICD-10-CM

## 2019-07-14 DIAGNOSIS — E230 Hypopituitarism: Secondary | ICD-10-CM

## 2019-07-14 DIAGNOSIS — E1165 Type 2 diabetes mellitus with hyperglycemia: Secondary | ICD-10-CM

## 2019-07-14 DIAGNOSIS — IMO0002 Reserved for concepts with insufficient information to code with codable children: Secondary | ICD-10-CM

## 2019-07-14 LAB — TESTOSTERONE, FREE AND TOTAL (INCLUDES SHBG)-(MALES)
% Free Testosterone: 3 %
Free Testosterone, S: 435 pg/mL — ABNORMAL HIGH
Sex Hormone Binding Globulin: 22.4 nmol/L
Testosterone, Serum (Total): 1451 ng/dL — ABNORMAL HIGH

## 2019-07-14 LAB — CBC
Hematocrit: 48.4 % (ref 37.5–51.0)
Hemoglobin: 16.2 g/dL (ref 13.0–17.7)
MCH: 30.7 pg (ref 26.6–33.0)
MCHC: 33.5 g/dL (ref 31.5–35.7)
MCV: 92 fL (ref 79–97)
Platelets: 199 10*3/uL (ref 150–450)
RBC: 5.27 x10E6/uL (ref 4.14–5.80)
RDW: 12.2 % (ref 11.6–15.4)
WBC: 8.3 10*3/uL (ref 3.4–10.8)

## 2019-07-14 LAB — MICROALBUMIN / CREATININE URINE RATIO
Creatinine, Urine: 70.6 mg/dL
Microalb/Creat Ratio: 14 mg/g creat (ref 0–29)
Microalbumin, Urine: 9.8 ug/mL

## 2019-07-14 LAB — PSA: Prostate Specific Ag, Serum: 0.5 ng/mL (ref 0.0–4.0)

## 2019-07-14 LAB — HEMOGLOBIN A1C
Est. average glucose Bld gHb Est-mCnc: 263 mg/dL
Hgb A1c MFr Bld: 10.8 % — ABNORMAL HIGH (ref 4.8–5.6)

## 2019-07-14 LAB — LIPID PANEL
Chol/HDL Ratio: 3.8 ratio (ref 0.0–5.0)
Cholesterol, Total: 130 mg/dL (ref 100–199)
HDL: 34 mg/dL — ABNORMAL LOW (ref >39)
LDL Chol Calc (NIH): 56 mg/dL (ref 0–99)
Triglycerides: 252 mg/dL — ABNORMAL HIGH (ref 0–149)
VLDL Cholesterol Cal: 40 mg/dL (ref 5–40)

## 2019-07-14 LAB — TSH: TSH: 1.67 u[IU]/mL (ref 0.450–4.500)

## 2019-07-14 MED ORDER — OZEMPIC (1 MG/DOSE) 2 MG/1.5ML ~~LOC~~ SOPN: 1.0000 mg | PEN_INJECTOR | SUBCUTANEOUS | 3 refills | Status: DC

## 2019-07-14 MED ORDER — TRESIBA FLEXTOUCH 200 UNIT/ML ~~LOC~~ SOPN: 80.0000 [IU] | PEN_INJECTOR | Freq: Every day | SUBCUTANEOUS | 3 refills | Status: DC

## 2019-07-14 NOTE — Progress Notes (Signed)
Patient ID: Kelechi Orgeron, male   DOB: 1966-03-25, 53 y.o.   MRN: [id number]  Patient location: Home My location: Office Persons participating in the virtual visit: patient, provider  Referring Provider: Lucille Passy, MD  I connected with the patient on 07/14/19 at 8:46 AM EST by a video enabled telemedicine application and verified that I am speaking with the correct person.   I discussed the limitations of evaluation and management by telemedicine and the availability of in person appointments. The patient expressed understanding and agreed to proceed.   Details of the encounter are shown below.  HPI: Devansh Riese is a 53 y.o.-year-old male, returning for f/u for DM2 dx 2014, insulin-dependent since 02/2014, uncontrolled, with complications (mild CKD) and hypogonadotropic hypogonadism. Last visit 6 months ago (telephone).  DM2: Last hemoglobin A1c was: Lab Results  Component Value Date   HGBA1C 10.8 (H) 07/08/2019   HGBA1C 8.3 (A) 09/25/2018   HGBA1C 8.7 11/11/2017   He is on: - Basaglar 40 >> Tresiba 60 >> 80 units daily >> stopped 2 mo ago - NovoLog 10-15 units 3 times a day before meals-restarted 12/2018  >> stopped 2 mo ago - Metformin 1000 mg 2x daily - Farxiga >> Jardiance 25 mg in a.m - Ozempic 1 mg weekly  >> stopped 2 mo ago We stopped Actoplus 15-850 mg in 02/2014. We stopped Glipizide when we increased Novolog. Insurance does not cover Invokana. We tried to add Actos 15 mg daily (added back 06/2016) >> stopped  We tried U500 insulin.  He checks his sugars 2-3 times a day, none fasting: - am:   230-250 >> 226-465 >> 210-240 >> 243-275 - 2h after b'fast: n/c >>  239, 247 >> n/c - lunch: 150-200 >> n/c >> 215 >> 230 >> 200s - 2h after lunch:  n/c >> 230-280 >> 200-240 >> n/c - dinner: 211, 231 >> n/c >> 327-475 >> 200-240 >> 200s - 2h after dinner: 220-280 >> 310-425 >> 200-240 >> n/c - bedtime:200-220 >> n/c >> 240-284 >> n/c  - nighttime: 130-218 >>  n/c >> 239 >> n/c   Meter: One Touch Ultra mini  Pt's meals are -he grazes throughout the day: - Breakfast: oatmeal or yoghurt or fruit bar/cup (largest meal) - Lunch: PB sandwich - Dinner: chicken leftovers - Snacks: sandwich, 1 pack of crackers, diet Mountain dew  -+ Mild CKD, last BUN/creatinine:  Lab Results  Component Value Date   BUN 13 12/11/2017   CREATININE 1.26 12/11/2017   Lab Results  Component Value Date   MICRALBCREAT 14 07/08/2019   MICRALBCREAT 0.3 05/13/2013   MICRALBCREAT 0.6 01/17/2010   MICRALBCREAT 4.8 12/30/2008   MICRALBCREAT 7.5 09/05/2007  Not on an ACE inhibitor.  -+ HL; last set of lipids: Lab Results  Component Value Date   CHOL 130 07/08/2019   HDL 34 (L) 07/08/2019   LDLCALC 56 07/08/2019   LDLDIRECT 136.0 11/01/2014   TRIG 252 (H) 07/08/2019   CHOLHDL 3.8 07/08/2019  On Crestor, fenofibrate.  - last eye exam was on 02/2019: No DR  -He denies numbness and tingling in his feet.  Hypogonadotropic hypogonadism: - He was on Androgel  in 2015 >> his testosterone levels did not increase and he did not feel better  >> he is now on intramuscular testosterone.  At last visit he was on 50 mg weekly, but upon questioning, he is actually taking 100 mg weekly inadvertently, as he was not aware of the testosterone concentration is 200  mg/mL.  Reviewed his testosterone levels-latest level was high on the above dose: Component     Latest Ref Rng & Units 07/08/2019  Testosterone, Serum (Total)     ng/dL 1,451 (H)  % Free Testosterone     % 3.0  Free Testosterone, S     pg/mL 435 (H)  Sex Hormone Binding Globulin     nmol/L 22.4   Component     Latest Ref Rng & Units 04/30/2017 12/11/2017  Testosterone     264 - 916 ng/dL 1,108 (H) 485  Testosterone Free     7.2 - 24.0 pg/mL 28.6 (H) 14.6  Sex Horm Binding Glob, Serum     19.3 - 76.4 nmol/L 33.0 39.5   PSA level was normal: Component     Latest Ref Rng & Units 07/08/2019  Prostate Specific  Ag, Serum     0.0 - 4.0 ng/mL 0.5   Component     Latest Ref Rng & Units 04/30/2017  Prostate Specific Ag, Serum     0.0 - 4.0 ng/mL 0.7   Lab Results  Component Value Date   PSA 0.69 03/01/2016   PSA 0.91 10/18/2015   PSA 0.37 01/17/2010   PSA 0.55 11/07/2009   Hemoglobin/hematocrit levels are normal: Lab Results  Component Value Date   WBC 8.3 07/08/2019   HGB 16.2 07/08/2019   HCT 48.4 07/08/2019   MCV 92 07/08/2019   PLT 199 07/08/2019   -We checked his pituitary function in 2016 and the labs were normal except for inappropriately normal LH and FSH and also low testosterone.  Prolactin was high but the monomeric prolactin was normal:  Component     Latest Ref Rng 05/16/2015  Testosterone     300 - 890 ng/dL 132 (L)  Sex Hormone Binding     10 - 50 nmol/L 28  Testosterone Free     47.0 - 244.0 pg/mL 26.9 (L)  Testosterone-% Free     1.6 - 2.9 % 2.0  IGF-I, LC/MS     50 - 317 ng/mL 99  Z-Score (Male)     -2.0-+2.0 SD -0.7  Prolactin, Total     2.0 - 18.0 ng/mL 35.4 (H)  Prolactin, Monomeric     3.4 - 14.8 ng/mL 11.4  LH     1.50 - 9.30 mIU/mL 2.03  FSH     1.4 - 18.1 mIU/ML 5.9  Cortisol, Plasma      14.2  C206 ACTH     6 - 50 pg/mL 32   Previous labs (drawn at 8:33 AM)   Prolactin 38.6 (2.1-17.1)  FSH 5.6, LH 2.0  Total testosterone 134 (300-890), bioavailable testosterone 35.5 ng/dL (130.5-681.7), free testosterone 18 pg/mL (47-244), SHBG 29 (10-50)  PSA 0.16  Estradiol 17.1 (0-39)  Reviewed the report of his pituitary MRI 07/14/2015 >> no pituitary lesion  Hyperprolactinemia: -  found during investigation for low testosterone, by Dr. Junious Silk, his urologist. -This turned out to be related to macroprolactinoma which does not require further investigation or treatment  He denies gynecomastia, galactorrhea, breast tenderness, hot flashes.   Patient's alkaline phosphatase was low in 04/2017 (25) so he started a multivitamin.  He has a history  of elevated TSH, but latest levels have been normal: Lab Results  Component Value Date   TSH 1.670 07/08/2019   TSH 2.79 11/11/2017   TSH 1.90 03/01/2016   TSH 1.52 05/16/2015   TSH 1.56 02/11/2014   TSH 1.68 05/13/2013   TSH 1.84 03/03/2013  TSH 0.69 12/29/2010   TSH 0.58 04/18/2010   TSH 0.58 01/17/2010    ROS: Constitutional: + weight gain/no weight loss, no fatigue, + subjective hyperthermia, no subjective hypothermia Eyes: no blurry vision, no xerophthalmia ENT: no sore throat, no nodules palpated in neck, no dysphagia, no odynophagia, no hoarseness Cardiovascular: no CP/no SOB/no palpitations/no leg swelling Respiratory: no cough/no SOB/no wheezing Gastrointestinal: no N/no V/no D/no C/no acid reflux Musculoskeletal: no muscle aches/no joint aches Skin: no rashes, no hair loss Neurological: no tremors/no numbness/no tingling/no dizziness  I reviewed pt's medications, allergies, PMH, social hx, family hx, and changes were documented in the history of present illness. Otherwise, unchanged from my initial visit note.  Past Medical History:  Diagnosis Date  . Anxiety   . Borderline diabetes   . Chronic pain   . Depression   . Diabetes mellitus without complication ([place])   . Hyperlipidemia   . Narcotic dependence ([place])   . Sleep apnea    no cpap   Past Surgical History:  Procedure Laterality Date  . CHOLECYSTECTOMY N/A 05/30/2016   Procedure: LAPAROSCOPIC CHOLECYSTECTOMY WITH INTRAOPERATIVE CHOLANGIOGRAM;  Surgeon: Donnie Mesa, MD;  Location: [facility];  Service: General;  Laterality: N/A;  . CHOLECYSTECTOMY N/A 05/30/2016   Procedure: OPEN COMMON BILE DUCT EXPLORATION; INSERTION OF T-TUBE;  Surgeon: Donnie Mesa, MD;  Location: [facility];  Service: General;  Laterality: N/A;  . removal of bone spur Bilateral 1995   elbps  . TOOTH EXTRACTION  2017  . UMBILICAL HERNIA REPAIR N/A 05/30/2016   Procedure: HERNIA REPAIR UMBILICAL ADULT;  Surgeon: Donnie Mesa, MD;  Location:  [facility];  Service: General;  Laterality: N/A;   Social History   Socioeconomic History  . Marital status: Married    Spouse name: Not on file  . Number of children: 2  . Years of education: Not on file  . Highest education level: High school graduate  Occupational History  . Occupation: Music therapist: UNEMPLOYED  Tobacco Use  . Smoking status: Never Smoker  . Smokeless tobacco: Current User    Types: Snuff  Substance and Sexual Activity  . Alcohol use: No    Comment: quir 2009  . Drug use: Not Currently  . Sexual activity: Not on file  Other Topics Concern  . Not on file  Social History Narrative   Lives at home with wife   Right handed   Caffeine: "maybe a soda during lunch or something"   Social Determinants of Health   Financial Resource Strain:   . Difficulty of Paying Living Expenses: Not on file  Food Insecurity:   . Worried About Charity fundraiser in the Last Year: Not on file  . Ran Out of Food in the Last Year: Not on file  Transportation Needs:   . Lack of Transportation (Medical): Not on file  . Lack of Transportation (Non-Medical): Not on file  Physical Activity:   . Days of Exercise per Week: Not on file  . Minutes of Exercise per Session: Not on file  Stress:   . Feeling of Stress : Not on file  Social Connections:   . Frequency of Communication with Friends and Family: Not on file  . Frequency of Social Gatherings with Friends and Family: Not on file  . Attends Religious Services: Not on file  . Active Member of Clubs or Organizations: Not on file  . Attends Archivist Meetings: Not on file  . Marital Status: Not on file  Intimate Partner Violence:   . Fear of Current or Ex-Partner: Not on file  . Emotionally Abused: Not on file  . Physically Abused: Not on file  . Sexually Abused: Not on file   Current Outpatient Medications on File Prior to Visit  Medication Sig Dispense Refill  . aspirin 81 MG tablet Take 81 mg by mouth  daily.      . BD INSULIN SYRINGE U/F 31G X 5/16" 0.5 ML MISC USE 3 TIMES DAILY 100 each 0  . Blood Glucose Monitoring Suppl (ONE TOUCH ULTRA MINI) W/DEVICE KIT Use as directed to test blood sugar twice daily 250.00 1 each 0  . fenofibrate 160 MG tablet TAKE 1 TABLET BY MOUTH EVERY DAY 30 tablet 5  . Insulin Pen Needle 32G X 4 MM MISC Use to inject insulin 1 time daily as instructed. 200 each 2  . Insulin Syringe-Needle U-100 25G X 1" 1 ML MISC USE TO INJECT TESTOSTERONE WEEKLY 4 each PRN  . JARDIANCE 25 MG TABS tablet TAKE 1 TABLET BY MOUTH EVERY DAY 30 tablet 2  . metFORMIN (GLUCOPHAGE) 1000 MG tablet TAKE 1 TABLET (1,000 MG TOTAL) BY MOUTH 2 (TWO) TIMES DAILY WITH A MEAL. 60 tablet 2  . methadone (DOLOPHINE) 10 MG/ML solution Take 180 mg by mouth daily.     . Multiple Vitamin (MULTIVITAMIN) tablet Take 2 tablets by mouth 2 (two) times daily.    . ONE TOUCH ULTRA TEST test strip USE 8 TIMES A DAY AS INSTRUCTED 300 each 4  . ONETOUCH DELICA LANCETS 11H MISC USE TO TEST BLOOD SUGAR 4 TIMES DAILY AS INSTRUCTED. 200 each 3  . rosuvastatin (CRESTOR) 10 MG tablet Take 1qd (must have fasting lab visit for anymore fills) 15 tablet 0  . SYRINGE-NEEDLE, DISP, 3 ML (B-D 3CC LUER-LOK SYR 18GX1-1/2) 18G X 1-1/2" 3 ML MISC USE TO DRAW UP TESTOSTERONE 1 each 1  . Syringe/Needle, Disp, 18G X 1" 1 ML MISC Use to draw up testosterone 100 each 5  . testosterone cypionate (DEPOTESTOSTERONE CYPIONATE) 200 MG/ML injection INJECT 0.25MLS INTO THE MUSCLE ONCE WEEKLY 1 mL 1  . [DISCONTINUED] insulin aspart (NOVOLOG FLEXPEN) 100 UNIT/ML FlexPen Inject 10-15 Units into the skin 3 (three) times daily with meals. 30 mL 5   No current facility-administered medications on file prior to visit.   Allergies  Allergen Reactions  . Gemfibrozil Diarrhea   Family History  Problem Relation Age of Onset  . Heart disease Father   . Cancer Father   . Colon cancer Neg Hx     PE: There were no vitals taken for this visit.  There is no height or weight on file to calculate BMI.  Wt Readings from Last 3 Encounters:  06/18/19 217 lb (98.4 kg)  05/14/19 200 lb (90.7 kg)  09/25/18 200 lb (90.7 kg)   Constitutional:  in NAD  The physical exam was not performed (virtual visit).  ASSESSMENT: 1. DM2, insulin-dependent, uncontrolled, with complications - mild ckd  - We discussed in the past about a VGO system or another type of pump >> but he works outside and sweats a lot >> he does not think he can wear this - We have discussed about a regular insulin pump in the past, but he cannot use any of the attached devices due to the fact that he has hyperhidrosis. - We did discuss about the possibility to refer him to another endocrinologist, possibly at [facility], for a second opinion, but he refuses.  2. Hypogonadotropic hypogonadism  3.  Hyperprolactinemia  4.  History of elevated TSH  PLAN:  1.  Complex patient with uncontrolled diabetes and high insulin resistance.  His sugars are not significantly affected by any of the changes in his medication regimen that we have tried in the past.  This is probably due to his constant eating/snacking and never being in a fasting state, not even at night.  He also works excessively, 20 hours a day and sleeps very little.  At last visit we added NovoLog.  However, latest HbA1c obtained 07/08/2019 was higher, at 10.9%.  He also gained approximately 17 pounds since last visit. -At this visit, he tells me that he actually stopped all of his injectable medicines (Ozempic, Tresiba, NovoLog) approximately 2 months ago due to lack of effect.  However, his HbA1c increased significantly, by 2.5%, so at this point, we discussed that his CBGs at home may not be accurate.  He will change his meter and strips.  We will restart his Ozempic and Tresiba (insulin at low dose and increase as needed).  For now, we will hold off NovoLog but we may need to add insulin in the near future - I suggested to:   Patient Instructions  Please continue: - Metformin 1000 mg 2x daily - Jardiance 25 mg in a.m.  Restart: - Ozempic 1 mg weekly - Tresiba 30 units daily (increase up to 80 units over the next 2 weeks, if needed)  Please decrease: - Testosterone injection 50 mg weekly into the muscle (draw to the 0.25 ml mark)  We will need to repeat testosterone level in 1.5 mo.  Please come for labs fasting, between 8 and 9 in the morning, halfway between your testosterone injections.  Please return in 4 months with your sugar log.    - advised to check sugars at different times of the day - 3x a day, rotating check times - advised for yearly eye exams >> he is UTD - We will check his kidney function when he returns to the clinic in 1.5 months - return to clinic in 4 months    2. Hypogonadotropic Hypogonadism -Reviewed his pituitary MRI report: Normal -He continues on testosterone IM injections 50 mg weekly.  On this dose, his testosterone level was normal in 11/2017, however, 6 days ago he had another testosterone level drawn and this was elevated.  Upon questioning, he is trying the testosterone in the syringe to the 0.5 mL mark, but he does have a 200 mg/mL formulation of testosterone so his dose is actually 100 mg/week.  I advised him to reduce the dose to 0.25 mL (50 mg) a week.  We will recheck his testosterone level in 1.5 months. -No side effects from testosterone replacement.  He still has some low libido but no breast tension or hot flashes. -Reviewed recent CBC and PSA and these were normal. -He is having annual DRE's with his urologist.  He does have prostate hypertrophy.  3.  Hyperprolactinemia  -This was due to mono prolactin so he does not need further investigation for this  4. H/o Elevated TSH -TSH was normal at last check 6 days ago: Lab Results  Component Value Date   TSH 1.670 07/08/2019   He needs all labs to go through [facility].  Orders Placed This Encounter  Procedures   . Testosterone Free with SHBG  . CMP14+EGFR    Philemon Kingdom, MD PhD [facility]

## 2019-07-14 NOTE — Patient Instructions (Addendum)
Please continue: - Metformin 1000 mg 2x daily - Jardiance 25 mg in a.m.  Restart: - Ozempic 1 mg weekly - Tresiba 30 units daily (increase up to 80 units over the next 2 weeks, if needed)  Please decrease: - Testosterone injection 50 mg weekly into the muscle (draw to the 0.25 ml mark)  We will need to repeat testosterone level in 1.5 mo.  Please come for labs fasting, between 8 and 9 in the morning, halfway between your testosterone injections.  Please return in 4 months with your sugar log.

## 2019-07-20 ENCOUNTER — Other Ambulatory Visit: Payer: Self-pay | Admitting: *Deleted

## 2019-07-20 ENCOUNTER — Encounter: Payer: Self-pay | Admitting: Family Medicine

## 2019-07-20 DIAGNOSIS — G4733 Obstructive sleep apnea (adult) (pediatric): Secondary | ICD-10-CM

## 2019-07-20 DIAGNOSIS — Z9989 Dependence on other enabling machines and devices: Secondary | ICD-10-CM

## 2019-07-20 DIAGNOSIS — G4731 Primary central sleep apnea: Secondary | ICD-10-CM

## 2019-07-20 DIAGNOSIS — G4730 Sleep apnea, unspecified: Secondary | ICD-10-CM

## 2019-07-20 NOTE — Progress Notes (Signed)
Looking at previous referral had Dr. Katy Fitch.  This is not ENT.  Redid referral for pt [facility].

## 2019-07-21 ENCOUNTER — Telehealth: Payer: Self-pay | Admitting: *Deleted

## 2019-07-21 NOTE — Telephone Encounter (Signed)
Referral faxed to [facility] for [facility]. Received a receipt of confirmation. I called their office first and spoke with Shayla to confirm correct fax number to send to.

## 2019-07-22 ENCOUNTER — Other Ambulatory Visit: Payer: Self-pay

## 2019-07-22 MED ORDER — ROSUVASTATIN CALCIUM 10 MG PO TABS: ORAL_TABLET | ORAL | 0 refills | Status: DC

## 2019-07-22 NOTE — Telephone Encounter (Signed)
Last OV 07/09/19 Last fill 03/19/19  #15/0 Pt has had labs performed at Dr. Cruzita Lederer office on 07/08/19

## 2019-07-29 ENCOUNTER — Telehealth: Payer: Self-pay | Admitting: Family Medicine

## 2019-07-29 NOTE — Telephone Encounter (Signed)
Copied from [place] [id number]. Topic: General - Other >> Jul 29, 2019  8:47 AM Keene Breath wrote: Reason for CRM: Patient called to inform the nurse or doctor that his referral was sent to the wrong doctor.  Please call to discuss at [phone]

## 2019-07-29 NOTE — Telephone Encounter (Signed)
Spoke with patient and explained referrals with him. Patient was referred to ENT from his neurologist for sleep apnea & is now scheduled for phychiatric with [facility] on 01/13 at 8:30. Patient verbalized understanding.

## 2019-08-10 ENCOUNTER — Other Ambulatory Visit: Payer: Self-pay

## 2019-08-10 MED ORDER — ROSUVASTATIN CALCIUM 10 MG PO TABS: ORAL_TABLET | ORAL | 11 refills | Status: DC

## 2019-08-25 ENCOUNTER — Other Ambulatory Visit: Payer: Self-pay | Admitting: Internal Medicine

## 2019-09-21 ENCOUNTER — Other Ambulatory Visit: Payer: Self-pay | Admitting: Family Medicine

## 2019-09-21 DIAGNOSIS — J0110 Acute frontal sinusitis, unspecified: Secondary | ICD-10-CM

## 2019-09-22 ENCOUNTER — Encounter: Payer: Self-pay | Admitting: Family Medicine

## 2019-09-22 ENCOUNTER — Other Ambulatory Visit: Payer: Self-pay

## 2019-09-22 ENCOUNTER — Ambulatory Visit (INDEPENDENT_AMBULATORY_CARE_PROVIDER_SITE_OTHER): Payer: 59 | Admitting: Family Medicine

## 2019-09-22 VITALS — BP 132/74 | HR 95 | Temp 97.8°F | Ht 69.0 in | Wt 208.0 lb

## 2019-09-22 DIAGNOSIS — E7800 Pure hypercholesterolemia, unspecified: Secondary | ICD-10-CM

## 2019-09-22 DIAGNOSIS — J309 Allergic rhinitis, unspecified: Secondary | ICD-10-CM | POA: Insufficient documentation

## 2019-09-22 DIAGNOSIS — G4730 Sleep apnea, unspecified: Secondary | ICD-10-CM

## 2019-09-22 DIAGNOSIS — F419 Anxiety disorder, unspecified: Secondary | ICD-10-CM | POA: Diagnosis not present

## 2019-09-22 DIAGNOSIS — F1120 Opioid dependence, uncomplicated: Secondary | ICD-10-CM

## 2019-09-22 DIAGNOSIS — F331 Major depressive disorder, recurrent, moderate: Secondary | ICD-10-CM | POA: Diagnosis not present

## 2019-09-22 MED ORDER — ROSUVASTATIN CALCIUM 10 MG PO TABS: ORAL_TABLET | ORAL | 3 refills | Status: DC

## 2019-09-22 MED ORDER — FLUTICASONE PROPIONATE 50 MCG/ACT NA SUSP: NASAL | 1 refills | Status: DC

## 2019-09-22 MED ORDER — FENOFIBRATE 160 MG PO TABS: 160.0000 mg | ORAL_TABLET | Freq: Every day | ORAL | 3 refills | Status: DC

## 2019-09-22 MED ORDER — BUPROPION HCL ER (XL) 150 MG PO TB24: 450.0000 mg | ORAL_TABLET | Freq: Every day | ORAL | 1 refills | Status: DC

## 2019-09-22 NOTE — Assessment & Plan Note (Signed)
Following with psychiatry and they are treating insomnia and depression/anxiety. Will get records. Pt has continued wellbutrin but has also started lexapro. Cont psych follow-up. Refill wellbutrin today

## 2019-09-22 NOTE — Assessment & Plan Note (Signed)
Managed by methadone clinic. Working on slowly tapering dose. No hx of IVDU.

## 2019-09-22 NOTE — Patient Instructions (Signed)
#   Depression - continue wellbutrin - let me know what psychiatry has put you on for medication  See you in 2 months

## 2019-09-22 NOTE — Assessment & Plan Note (Signed)
Unable to tolerate CPAP 

## 2019-09-22 NOTE — Progress Notes (Signed)
Subjective:     Martin Downs is a 54 y.o. male presenting for Medication Refill (Buproprion)     HPI   #Depression #Anxiety - went to the psychiatrist  - more a lack of caring - 450 mg of Bupropion - did go to psychiatry - was started on a new medication at a low dose - not sure what this was - psychiatry follow-up in [place] - not currently in therapy  Reviewed 07/09/2019 note from previous PCP - Pt had stopped wellbutrin as not working. Plan at that time was for urgent psych referral   Review of Systems   Social History   Tobacco Use  Smoking Status Never Smoker  Smokeless Tobacco Current User  . Types: Snuff        Objective:    BP Readings from Last 3 Encounters:  09/22/19 132/74  06/18/19 140/76  09/25/18 (!) 142/80   Wt Readings from Last 3 Encounters:  09/22/19 208 lb (94.3 kg)  06/18/19 217 lb (98.4 kg)  05/14/19 200 lb (90.7 kg)    BP 132/74 (BP Location: Left Arm, Patient Position: Sitting, Cuff Size: Normal)   Pulse 95   Temp 97.8 F (36.6 C) (Temporal)   Ht 5\' 9"  (1.753 m)   Wt 208 lb (94.3 kg)   SpO2 97%   BMI 30.72 kg/m    Physical Exam Constitutional:      Appearance: Normal appearance. He is not ill-appearing or diaphoretic.  HENT:     Right Ear: External ear normal.     Left Ear: External ear normal.     Nose: Nose normal.  Eyes:     General: No scleral icterus.    Extraocular Movements: Extraocular movements intact.     Conjunctiva/sclera: Conjunctivae normal.  Cardiovascular:     Rate and Rhythm: Normal rate.  Pulmonary:     Effort: Pulmonary effort is normal.  Musculoskeletal:     Cervical back: Neck supple.  Skin:    General: Skin is warm and dry.  Neurological:     Mental Status: He is alert. Mental status is at baseline.  Psychiatric:        Mood and Affect: Mood normal.           Assessment & Plan:   Problem List Items Addressed This Visit      Respiratory   Sleep apnea    Unable to  tolerate CPAP.       Allergic rhinitis   Relevant Medications   fluticasone (FLONASE) 50 MCG/ACT nasal spray     Other   Pure hypercholesterolemia   Relevant Medications   rosuvastatin (CRESTOR) 10 MG tablet   fenofibrate 160 MG tablet   Long-term current use of methadone for opiate dependence ([place])    Managed by methadone clinic. Working on slowly tapering dose. No hx of IVDU.       Depression - Primary    Following with psychiatry and they are treating insomnia and depression/anxiety. Will get records. Pt has continued wellbutrin but has also started lexapro. Cont psych follow-up. Refill wellbutrin today      Relevant Medications   buPROPion (WELLBUTRIN XL) 150 MG 24 hr tablet   mirtazapine (REMERON) 15 MG tablet   escitalopram (LEXAPRO) 10 MG tablet   Anxiety   Relevant Medications   buPROPion (WELLBUTRIN XL) 150 MG 24 hr tablet   mirtazapine (REMERON) 15 MG tablet   escitalopram (LEXAPRO) 10 MG tablet       Return in about  2 months (around 11/20/2019) for establish care.  Lynnda Child, MD

## 2019-11-23 ENCOUNTER — Encounter: Payer: Self-pay | Admitting: Family Medicine

## 2019-11-23 ENCOUNTER — Other Ambulatory Visit: Payer: Self-pay

## 2019-11-23 ENCOUNTER — Ambulatory Visit: Payer: 59 | Admitting: Family Medicine

## 2019-11-23 VITALS — BP 128/62 | HR 80 | Temp 98.1°F | Ht 69.0 in | Wt 203.5 lb

## 2019-11-23 DIAGNOSIS — F411 Generalized anxiety disorder: Secondary | ICD-10-CM

## 2019-11-23 DIAGNOSIS — E1165 Type 2 diabetes mellitus with hyperglycemia: Secondary | ICD-10-CM

## 2019-11-23 DIAGNOSIS — E1121 Type 2 diabetes mellitus with diabetic nephropathy: Secondary | ICD-10-CM | POA: Diagnosis not present

## 2019-11-23 DIAGNOSIS — IMO0002 Reserved for concepts with insufficient information to code with codable children: Secondary | ICD-10-CM

## 2019-11-23 DIAGNOSIS — F331 Major depressive disorder, recurrent, moderate: Secondary | ICD-10-CM

## 2019-11-23 DIAGNOSIS — E782 Mixed hyperlipidemia: Secondary | ICD-10-CM

## 2019-11-23 DIAGNOSIS — E230 Hypopituitarism: Secondary | ICD-10-CM

## 2019-11-23 NOTE — Progress Notes (Signed)
Subjective:     Martin Downs is a 54 y.o. male presenting for Transfer of Care (from Dr Deborra Medina)     HPI  #Cholesterol management - taking fenofibrate and crestor - did notice some leg cramping today  Lab Results  Component Value Date   CHOL 130 07/08/2019   HDL 34 (L) 07/08/2019   LDLCALC 56 07/08/2019   LDLDIRECT 136.0 11/01/2014   TRIG 252 (H) 07/08/2019   CHOLHDL 3.8 07/08/2019    #Depression/anxiety - seeing psych NP - having a lot of medication changes - has sleep issues as well - not happy with current management - would like a different psychiatrist  - cannot tolerate cpap - low libido on most recent medication  #Diabetes - partial diabetic diet - follows with endocrine 3 times a year - thinks he is due for a follow-up - taking medications  Review of Systems   Social History   Tobacco Use  Smoking Status Never Smoker  Smokeless Tobacco Current User  . Types: Snuff        Objective:    BP Readings from Last 3 Encounters:  11/23/19 128/62  09/22/19 132/74  06/18/19 140/76   Wt Readings from Last 3 Encounters:  11/23/19 203 lb 8 oz (92.3 kg)  09/22/19 208 lb (94.3 kg)  06/18/19 217 lb (98.4 kg)    BP 128/62   Pulse 80   Temp 98.1 F (36.7 C)   Ht 5\' 9"  (1.753 m)   Wt 203 lb 8 oz (92.3 kg)   SpO2 99%   BMI 30.05 kg/m    Physical Exam Constitutional:      Appearance: Normal appearance. He is not ill-appearing or diaphoretic.  HENT:     Right Ear: External ear normal.     Left Ear: External ear normal.  Eyes:     General: No scleral icterus.    Extraocular Movements: Extraocular movements intact.     Conjunctiva/sclera: Conjunctivae normal.  Cardiovascular:     Rate and Rhythm: Normal rate.  Pulmonary:     Effort: Pulmonary effort is normal.  Musculoskeletal:     Cervical back: Neck supple.  Skin:    General: Skin is warm and dry.  Neurological:     Mental Status: He is alert. Mental status is at baseline.    Psychiatric:        Mood and Affect: Mood normal.    Diabetic Foot Exam - Simple   Simple Foot Form Diabetic Foot exam was performed with the following findings: Yes 11/23/2019  9:40 AM  Visual Inspection Sensation Testing Pulse Check Comments Sensation present at site 6, but diminished           Assessment & Plan:   Problem List Items Addressed This Visit      Endocrine   Uncontrolled type 2 diabetes mellitus with nephropathy (HCC) - Primary    Sees Dr. Cruzita Lederer with Endocrinology, appreciate support. Last hemoglobin a1c >10. Today with some reduced sensation on foot exam. Encouraged f/u with endo as he is overdue. Also wonder if depression is impacting his diabetes management an will focus on treating depression.       Relevant Orders   Lipid panel   Hypogonadotropic hypogonadism ([place])    On testosterone replacement and follows with Dr. Cruzita Lederer with endocrinology. Appreciate support. Most recent testosterone decreased but missed f/u labs. Encouraged scheduling endocrine appointment        Other   HLD (hyperlipidemia)    Stop fenofibrate. Repeat  lipids in 3 months. Cont crestor      Relevant Orders   Lipid panel   Generalized anxiety disorder   Relevant Orders   Ambulatory referral to Psychiatry   Depression    Has been following with psych, but not happy with care and does not feel he is improving. Cont current medications, new referral placed. Discussed it could take a while so he will continue with current provider.       Relevant Orders   Ambulatory referral to Psychiatry       Return in about 6 months (around 05/24/2020).  Lynnda Child, MD

## 2019-11-23 NOTE — Assessment & Plan Note (Addendum)
On testosterone replacement and follows with Dr. Elvera Lennox with endocrinology. Appreciate support. Most recent testosterone decreased but missed f/u labs. Encouraged scheduling endocrine appointment

## 2019-11-23 NOTE — Patient Instructions (Addendum)
#  Cholesterol - stop fenofibrate - continue crestor - make lab appointment in 3 months to repeat cholesterol

## 2019-11-23 NOTE — Assessment & Plan Note (Signed)
Has been following with psych, but not happy with care and does not feel he is improving. Cont current medications, new referral placed. Discussed it could take a while so he will continue with current provider.

## 2019-11-23 NOTE — Assessment & Plan Note (Addendum)
Sees Dr. Elvera Lennox with Endocrinology, appreciate support. Last hemoglobin a1c >10. Today with some reduced sensation on foot exam. Encouraged f/u with endo as he is overdue. Also wonder if depression is impacting his diabetes management an will focus on treating depression.

## 2019-11-23 NOTE — Assessment & Plan Note (Signed)
Stop fenofibrate. Repeat lipids in 3 months. Cont crestor

## 2019-12-19 ENCOUNTER — Other Ambulatory Visit: Payer: Self-pay | Admitting: Internal Medicine

## 2019-12-22 ENCOUNTER — Other Ambulatory Visit (INDEPENDENT_AMBULATORY_CARE_PROVIDER_SITE_OTHER): Payer: 59

## 2019-12-22 ENCOUNTER — Other Ambulatory Visit: Payer: Self-pay

## 2019-12-22 DIAGNOSIS — E1121 Type 2 diabetes mellitus with diabetic nephropathy: Secondary | ICD-10-CM

## 2019-12-22 DIAGNOSIS — E1165 Type 2 diabetes mellitus with hyperglycemia: Secondary | ICD-10-CM

## 2019-12-22 DIAGNOSIS — E230 Hypopituitarism: Secondary | ICD-10-CM

## 2019-12-22 DIAGNOSIS — IMO0002 Reserved for concepts with insufficient information to code with codable children: Secondary | ICD-10-CM

## 2019-12-27 ENCOUNTER — Other Ambulatory Visit: Payer: Self-pay | Admitting: Internal Medicine

## 2019-12-29 LAB — CMP14+EGFR
ALT: 38 IU/L (ref 0–44)
AST: 23 IU/L (ref 0–40)
Albumin/Globulin Ratio: 1.9 (ref 1.2–2.2)
Albumin: 4.2 g/dL (ref 3.8–4.9)
Alkaline Phosphatase: 51 IU/L (ref 48–121)
BUN/Creatinine Ratio: 13 (ref 9–20)
BUN: 12 mg/dL (ref 6–24)
Bilirubin Total: 0.5 mg/dL (ref 0.0–1.2)
CO2: 23 mmol/L (ref 20–29)
Calcium: 9.7 mg/dL (ref 8.7–10.2)
Chloride: 102 mmol/L (ref 96–106)
Creatinine, Ser: 0.89 mg/dL (ref 0.76–1.27)
GFR calc Af Amer: 111 mL/min/{1.73_m2} (ref >59)
GFR calc non Af Amer: 96 mL/min/{1.73_m2} (ref >59)
Globulin, Total: 2.2 g/dL (ref 1.5–4.5)
Glucose: 126 mg/dL — ABNORMAL HIGH (ref 65–99)
Potassium: 4.6 mmol/L (ref 3.5–5.2)
Sodium: 141 mmol/L (ref 134–144)
Total Protein: 6.4 g/dL (ref 6.0–8.5)

## 2019-12-29 LAB — TESTOSTERONE, FREE AND TOTAL (INCLUDES SHBG)-(MALES)
% Free Testosterone: 1.1 %
Free Testosterone, S: 1.7 pg/mL — ABNORMAL LOW
Sex Hormone Binding Globulin: 45.1 nmol/L
Testosterone, Serum (Total): 15 ng/dL — ABNORMAL LOW

## 2020-01-19 ENCOUNTER — Other Ambulatory Visit: Payer: Self-pay | Admitting: Family Medicine

## 2020-01-19 ENCOUNTER — Other Ambulatory Visit: Payer: Self-pay | Admitting: Internal Medicine

## 2020-01-19 DIAGNOSIS — F419 Anxiety disorder, unspecified: Secondary | ICD-10-CM

## 2020-01-19 DIAGNOSIS — F331 Major depressive disorder, recurrent, moderate: Secondary | ICD-10-CM

## 2020-01-20 ENCOUNTER — Other Ambulatory Visit: Payer: Self-pay | Admitting: Internal Medicine

## 2020-02-01 ENCOUNTER — Other Ambulatory Visit: Payer: Self-pay | Admitting: Internal Medicine

## 2020-02-24 ENCOUNTER — Other Ambulatory Visit (INDEPENDENT_AMBULATORY_CARE_PROVIDER_SITE_OTHER): Payer: 59

## 2020-02-24 ENCOUNTER — Other Ambulatory Visit: Payer: Self-pay

## 2020-02-24 DIAGNOSIS — E782 Mixed hyperlipidemia: Secondary | ICD-10-CM | POA: Diagnosis not present

## 2020-02-24 DIAGNOSIS — E1165 Type 2 diabetes mellitus with hyperglycemia: Secondary | ICD-10-CM

## 2020-02-24 DIAGNOSIS — IMO0002 Reserved for concepts with insufficient information to code with codable children: Secondary | ICD-10-CM

## 2020-02-24 DIAGNOSIS — E1121 Type 2 diabetes mellitus with diabetic nephropathy: Secondary | ICD-10-CM | POA: Diagnosis not present

## 2020-02-24 LAB — LIPID PANEL
Cholesterol: 138 mg/dL (ref 0–200)
HDL: 31.5 mg/dL — ABNORMAL LOW (ref >39.00)
Total CHOL/HDL Ratio: 4
Triglycerides: 523 mg/dL — ABNORMAL HIGH (ref 0.0–149.0)

## 2020-02-24 LAB — LDL CHOLESTEROL, DIRECT: Direct LDL: 73 mg/dL

## 2020-02-25 ENCOUNTER — Other Ambulatory Visit: Payer: Self-pay | Admitting: Family Medicine

## 2020-02-25 DIAGNOSIS — E781 Pure hyperglyceridemia: Secondary | ICD-10-CM

## 2020-02-25 MED ORDER — FENOFIBRATE 160 MG PO TABS: 160.0000 mg | ORAL_TABLET | Freq: Every day | ORAL | 1 refills | Status: DC

## 2020-03-25 LAB — HM DIABETES EYE EXAM

## 2020-03-28 ENCOUNTER — Encounter: Payer: Self-pay | Admitting: Internal Medicine

## 2020-03-30 ENCOUNTER — Ambulatory Visit (INDEPENDENT_AMBULATORY_CARE_PROVIDER_SITE_OTHER): Payer: 59 | Admitting: Internal Medicine

## 2020-03-30 ENCOUNTER — Encounter: Payer: Self-pay | Admitting: Family Medicine

## 2020-03-30 ENCOUNTER — Encounter: Payer: Self-pay | Admitting: Internal Medicine

## 2020-03-30 ENCOUNTER — Other Ambulatory Visit: Payer: Self-pay

## 2020-03-30 VITALS — BP 140/90 | HR 62 | Ht 69.0 in | Wt 199.0 lb

## 2020-03-30 DIAGNOSIS — E1165 Type 2 diabetes mellitus with hyperglycemia: Secondary | ICD-10-CM

## 2020-03-30 DIAGNOSIS — R7989 Other specified abnormal findings of blood chemistry: Secondary | ICD-10-CM

## 2020-03-30 DIAGNOSIS — E230 Hypopituitarism: Secondary | ICD-10-CM

## 2020-03-30 DIAGNOSIS — E1121 Type 2 diabetes mellitus with diabetic nephropathy: Secondary | ICD-10-CM

## 2020-03-30 DIAGNOSIS — IMO0002 Reserved for concepts with insufficient information to code with codable children: Secondary | ICD-10-CM

## 2020-03-30 LAB — POCT GLYCOSYLATED HEMOGLOBIN (HGB A1C): Hemoglobin A1C: 10.5 % — AB (ref 4.0–5.6)

## 2020-03-30 MED ORDER — HUMALOG KWIKPEN 200 UNIT/ML ~~LOC~~ SOPN: PEN_INJECTOR | SUBCUTANEOUS | 3 refills | Status: DC

## 2020-03-30 NOTE — Patient Instructions (Addendum)
Please continue: - Metformin 1000 mg 2x daily - Jardiance 25 mg in a.m. - Ozempic 1 mg weekly - Tresiba 80 units daily   Please add: - Humalog U200 10-14 units 15 min before meals  Please change: - Testosterone injection 50 mg weekly into the muscle (draw to the 0.25 ml mark)  Please return in 4 months with your sugar log.

## 2020-03-30 NOTE — Progress Notes (Signed)
Patient ID: Martin Downs, male   DOB: 08/25/1965, 54 y.o.   MRN: [id number]  This visit occurred during the SARS-CoV-2 public health emergency.  Safety protocols were in place, including screening questions prior to the visit, additional usage of staff PPE, and extensive cleaning of exam room while observing appropriate contact time as indicated for disinfecting solutions.   HPI: Martin Downs is a 54 y.o.-year-old male, returning for f/u for DM2 dx 2014, insulin-dependent since 02/2014, uncontrolled, with complications (mild CKD) and hypogonadotropic hypogonadism. Last visit almost 9 months ago.  Since last OV,  he started to see psychiatry for depression/anxiety.  He was also referred to a sleep specialist. He has OSA >> cannot tolerate CPAP.  DM2: Reviewed HbA1c levels: Lab Results  Component Value Date   HGBA1C 10.8 (H) 07/08/2019   HGBA1C 8.3 (A) 09/25/2018   HGBA1C 8.7 11/11/2017   He is on: - Basaglar 40 >> Tresiba 60 >> 80 units daily >> stopped >> restarted at 30 units daily >> 80 units - may forget - NovoLog 10-15 units 3 times a day before meals-restarted 12/2018  >> stopped before last visit - Metformin 1000 mg 2x daily - Farxiga >> Jardiance 25 mg in a.m - Ozempic 1 mg weekly  >> stopped before last visit >> restarted We stopped Actoplus 15-850 mg in 02/2014. We stopped Glipizide when we increased Novolog. Insurance does not cover Invokana. We tried to add Actos 15 mg daily (added back 06/2016) >> stopped  We tried U500 insulin.  He checks his sugars 2-3 times a day, not fasting: - am:   230-250 >> 226-465 >> 210-240 >> 243-275 >> 230s - 2h after b'fast: n/c >>  239, 247 >> n/c - lunch: 150-200 >> n/c >> 215 >> 230 >> 200s >> 200s - 2h after lunch:  n/c >> 230-280 >> 200-240 >> n/c - dinner: 211, 231 >> n/c >> 327-475 >> 200-240 >> 200s >> 200s - 2h after dinner: 220-280 >> 310-425 >> 200-240 >> n/c - bedtime:200-220 >> n/c >> 240-284 >> n/c  - nighttime:  130-218 >> n/c >> 239 >> n/c   Meter: One Touch Ultra mini  Pt's meals are -he grazes throughout the day: - Breakfast: oatmeal or yoghurt or fruit bar/cup (largest meal) - Lunch: PB sandwich - Dinner: chicken leftovers - Snacks: sandwich, 1 pack of crackers, diet Mountain dew  -+ Mild CKD, last BUN/creatinine:  Lab Results  Component Value Date   BUN 12 12/22/2019   CREATININE 0.89 12/22/2019   Lab Results  Component Value Date   MICRALBCREAT 14 07/08/2019   MICRALBCREAT 0.3 05/13/2013   MICRALBCREAT 0.6 01/17/2010   MICRALBCREAT 4.8 12/30/2008   MICRALBCREAT 7.5 09/05/2007  Not on ACE inhibitor.  -+ HL; last set of lipids: Lab Results  Component Value Date   CHOL 138 02/24/2020   HDL 31.50 (L) 02/24/2020   LDLCALC 56 07/08/2019   LDLDIRECT 73.0 02/24/2020   TRIG (H) 02/24/2020    523.0 Triglyceride is over 400; calculations on Lipids are invalid.   CHOLHDL 4 02/24/2020  On Crestor, fenofibrate.  - last eye exam was on 03/25/2020: No DR  -No numbness and tingling in his feet.  Hypogonadotropic hypogonadism: - He was on Androgel  in 2015 >> his testosterone levels did not increase and he did not feel better  >> he is now on intramuscular testosterone.  At last visit he was on 50 mg weekly, but upon questioning, he is actually taking 100 mg  weekly inadvertently, as he was not aware of the testosterone concentration is 200 mg/mL.  Reviewed his testosterone levels: Latest was high on the above dose so we decreased the dose to 50 mg weekly since then but he did not return for further labs.  Upon questioning, he decreased the dose as advised but also moved the injections every other week.  Subsequently, the latest testosterone levels were very low:  Component     Latest Ref Rng & Units 12/22/2019  Testosterone, Serum (Total)     ng/dL 15 (L)  % Free Testosterone     % 1.1  Free Testosterone, S     pg/mL 1.7 (L)  Sex Hormone Binding Globulin     nmol/L 45.1    Component     Latest Ref Rng & Units 07/08/2019  Testosterone, Serum (Total)     ng/dL 1,451 (H)  % Free Testosterone     % 3.0  Free Testosterone, S     pg/mL 435 (H)  Sex Hormone Binding Globulin     nmol/L 22.4   Component     Latest Ref Rng & Units 04/30/2017 12/11/2017  Testosterone     264 - 916 ng/dL 1,108 (H) 485  Testosterone Free     7.2 - 24.0 pg/mL 28.6 (H) 14.6  Sex Horm Binding Glob, Serum     19.3 - 76.4 nmol/L 33.0 39.5   PSA levels were normal: Component     Latest Ref Rng & Units 07/08/2019  Prostate Specific Ag, Serum     0.0 - 4.0 ng/mL 0.5   Component     Latest Ref Rng & Units 04/30/2017  Prostate Specific Ag, Serum     0.0 - 4.0 ng/mL 0.7   Lab Results  Component Value Date   PSA 0.69 03/01/2016   PSA 0.91 10/18/2015   PSA 0.37 01/17/2010   PSA 0.55 11/07/2009   His hemoglobin and hematocrit levels were normal: Lab Results  Component Value Date   WBC 8.3 07/08/2019   HGB 16.2 07/08/2019   HCT 48.4 07/08/2019   MCV 92 07/08/2019   PLT 199 07/08/2019   -We checked his pituitary function in 2016 and the labs were normal except for inappropriately normal LH and FSH and also low testosterone.  Prolactin was high but the monomeric prolactin was normal:  Component     Latest Ref Rng 05/16/2015  Testosterone     300 - 890 ng/dL 132 (L)  Sex Hormone Binding     10 - 50 nmol/L 28  Testosterone Free     47.0 - 244.0 pg/mL 26.9 (L)  Testosterone-% Free     1.6 - 2.9 % 2.0  IGF-I, LC/MS     50 - 317 ng/mL 99  Z-Score (Male)     -2.0-+2.0 SD -0.7  Prolactin, Total     2.0 - 18.0 ng/mL 35.4 (H)  Prolactin, Monomeric     3.4 - 14.8 ng/mL 11.4  LH     1.50 - 9.30 mIU/mL 2.03  FSH     1.4 - 18.1 mIU/ML 5.9  Cortisol, Plasma      14.2  C206 ACTH     6 - 50 pg/mL 32   Previous labs (drawn at 8:33 AM)   Prolactin 38.6 (2.1-17.1)  FSH 5.6, LH 2.0  Total testosterone 134 (300-890), bioavailable testosterone 35.5 ng/dL (130.5-681.7), free  testosterone 18 pg/mL (47-244), SHBG 29 (10-50)  PSA 0.16  Estradiol 17.1 (0-39)  Reviewed the report of  his pituitary MRI 07/14/2015: No pituitary lesion  Hyperprolactinemia: -  found during investigation for low testosterone, by Dr. Junious Silk, his urologist. -This turned out to be related to macroprolactinoma which does not require further investigation or treatment  No gynecomastia, galactorrhea, breast tenderness.   Patient's alkaline phosphatase was low in 04/2017 (25) so he started a multivitamin.  He has a history of elevated TSH, but latest levels have been normal. Lab Results  Component Value Date   TSH 1.670 07/08/2019   TSH 2.79 11/11/2017   TSH 1.90 03/01/2016   TSH 1.52 05/16/2015   TSH 1.56 02/11/2014   TSH 1.68 05/13/2013   TSH 1.84 03/03/2013   TSH 0.69 12/29/2010   TSH 0.58 04/18/2010   TSH 0.58 01/17/2010    ROS: Constitutional: no weight gain/+ weight loss, + fatigue, no subjective hyperthermia, no subjective hypothermia Eyes: no blurry vision, no xerophthalmia ENT: no sore throat, no nodules palpated in neck, no dysphagia, no odynophagia, no hoarseness Cardiovascular: no CP/no SOB/no palpitations/no leg swelling Respiratory: no cough/no SOB/no wheezing Gastrointestinal: no N/no V/no D/no C/no acid reflux Musculoskeletal: no muscle aches/no joint aches Skin: no rashes, no hair loss Neurological: no tremors/no numbness/no tingling/no dizziness  I reviewed pt's medications, allergies, PMH, social hx, family hx, and changes were documented in the history of present illness. Otherwise, unchanged from my initial visit note.  Past Medical History:  Diagnosis Date  . Anxiety   . Borderline diabetes   . Chronic pain   . Depression   . Diabetes mellitus without complication ([place])   . Hyperlipidemia   . Narcotic dependence ([place])   . Sleep apnea    no cpap   Past Surgical History:  Procedure Laterality Date  . CHOLECYSTECTOMY N/A 05/30/2016    Procedure: LAPAROSCOPIC CHOLECYSTECTOMY WITH INTRAOPERATIVE CHOLANGIOGRAM;  Surgeon: Donnie Mesa, MD;  Location: [facility];  Service: General;  Laterality: N/A;  . CHOLECYSTECTOMY N/A 05/30/2016   Procedure: OPEN COMMON BILE DUCT EXPLORATION; INSERTION OF T-TUBE;  Surgeon: Donnie Mesa, MD;  Location: [facility];  Service: General;  Laterality: N/A;  . removal of bone spur Bilateral 1995   elbps  . TOOTH EXTRACTION  2017  . UMBILICAL HERNIA REPAIR N/A 05/30/2016   Procedure: HERNIA REPAIR UMBILICAL ADULT;  Surgeon: Donnie Mesa, MD;  Location: [facility];  Service: General;  Laterality: N/A;   Social History   Socioeconomic History  . Marital status: Married    Spouse name: Amy  . Number of children: 2  . Years of education: Not on file  . Highest education level: High school graduate  Occupational History  . Occupation: Music therapist: UNEMPLOYED  Tobacco Use  . Smoking status: Never Smoker  . Smokeless tobacco: Current User    Types: Snuff  Vaping Use  . Vaping Use: Never used  Substance and Sexual Activity  . Alcohol use: No    Comment: quit 2009  . Drug use: Not Currently  . Sexual activity: Yes    Birth control/protection: Post-menopausal  Other Topics Concern  . Not on file  Social History Narrative   11/23/19   From: the area   Living: with wife Amy, high school sweet hearts   Work: Music therapist, works Warehouse manager for a [employer]      Family: 2 children - Ria Comment and Sport and exercise psychologist - grown and out of the house      Enjoys: not sure, works too much, watch TV      Exercise: walking at his  job - bending/lifting   Diet: tries to do diabetic diet - but only partially, low appetite      Safety   Seat belts: Yes    Guns: declined   Safe in relationships: Yes    Social Determinants of Health   Financial Resource Strain:   . Difficulty of Paying Living Expenses: Not on file  Food Insecurity:   . Worried About Charity fundraiser in the Last Year: Not on file  .  Ran Out of Food in the Last Year: Not on file  Transportation Needs:   . Lack of Transportation (Medical): Not on file  . Lack of Transportation (Non-Medical): Not on file  Physical Activity:   . Days of Exercise per Week: Not on file  . Minutes of Exercise per Session: Not on file  Stress:   . Feeling of Stress : Not on file  Social Connections:   . Frequency of Communication with Friends and Family: Not on file  . Frequency of Social Gatherings with Friends and Family: Not on file  . Attends Religious Services: Not on file  . Active Member of Clubs or Organizations: Not on file  . Attends Archivist Meetings: Not on file  . Marital Status: Not on file  Intimate Partner Violence:   . Fear of Current or Ex-Partner: Not on file  . Emotionally Abused: Not on file  . Physically Abused: Not on file  . Sexually Abused: Not on file   Current Outpatient Medications on File Prior to Visit  Medication Sig Dispense Refill  . ARIPiprazole (ABILIFY) 5 MG tablet Take 5 mg by mouth daily.    Marland Kitchen aspirin 81 MG tablet Take 81 mg by mouth daily.      . BD DISP NEEDLES 18G X 1-1/2" MISC USE TO DRAW UP TESTOSTERONE 4 each 24  . BD INSULIN SYRINGE U/F 31G X 5/16" 0.5 ML MISC USE 3 TIMES DAILY 100 each 0  . Blood Glucose Monitoring Suppl (ONE TOUCH ULTRA MINI) W/DEVICE KIT Use as directed to test blood sugar twice daily 250.00 1 each 0  . buPROPion (WELLBUTRIN XL) 150 MG 24 hr tablet Take 3 tablets (450 mg total) by mouth daily. 270 tablet 1  . escitalopram (LEXAPRO) 10 MG tablet Take 10 mg by mouth daily.    . fenofibrate 160 MG tablet Take 1 tablet (160 mg total) by mouth daily. 90 tablet 1  . fluticasone (FLONASE) 50 MCG/ACT nasal spray INSTILL 2 SPRAYS INTO EACH NOSTRIL EVERY DAY 16 mL 1  . Insulin Degludec (TRESIBA FLEXTOUCH) 200 UNIT/ML SOPN Inject 80 Units into the skin daily. 9 pen 3  . Insulin Pen Needle 32G X 4 MM MISC Use to inject insulin 1 time daily as instructed. 200 each 2  .  Insulin Syringe-Needle U-100 (B-D INSULIN SYRINGE 1CC/25GX1") 25G X 1" 1 ML MISC USE TO INJECT TESTOSTERONE WEEKLY 4 each 99  . JARDIANCE 25 MG TABS tablet TAKE 1 TABLET BY MOUTH EVERY DAY 30 tablet 2  . metFORMIN (GLUCOPHAGE) 1000 MG tablet Take 1 tablet by mouth twice daily. **Must be seen for future refill** 60 tablet 0  . methadone (DOLOPHINE) 10 MG/ML solution Take 180 mg by mouth daily.     . mirtazapine (REMERON) 15 MG tablet Take 15 mg by mouth at bedtime.    . Multiple Vitamin (MULTIVITAMIN) tablet Take 2 tablets by mouth 2 (two) times daily.    . ONE TOUCH ULTRA TEST test strip USE 8 TIMES  A DAY AS INSTRUCTED 300 each 4  . ONETOUCH DELICA LANCETS 32G MISC USE TO TEST BLOOD SUGAR 4 TIMES DAILY AS INSTRUCTED. 200 each 3  . rosuvastatin (CRESTOR) 10 MG tablet Take 1qd 90 tablet 3  . Semaglutide, 1 MG/DOSE, (OZEMPIC, 1 MG/DOSE,) 2 MG/1.5ML SOPN Inject 1 mg into the skin once a week. 6 pen 3  . SYRINGE-NEEDLE, DISP, 3 ML (B-D 3CC LUER-LOK SYR 18GX1-1/2) 18G X 1-1/2" 3 ML MISC USE TO DRAW UP TESTOSTERONE 1 each 1  . Syringe/Needle, Disp, 18G X 1" 1 ML MISC Use to draw up testosterone 100 each 5  . testosterone cypionate (DEPOTESTOSTERONE CYPIONATE) 200 MG/ML injection INJECT 0.25MLS INTO THE MUSCLE ONCE WEEKLY 1 mL 1  . [DISCONTINUED] insulin aspart (NOVOLOG FLEXPEN) 100 UNIT/ML FlexPen Inject 10-15 Units into the skin 3 (three) times daily with meals. 30 mL 5   No current facility-administered medications on file prior to visit.   Allergies  Allergen Reactions  . Gemfibrozil Diarrhea   Family History  Problem Relation Age of Onset  . Hypertension Mother   . Cancer Father        not sure of type  . Diabetes Father   . Hypertension Father   . Heart attack Father 34  . Diabetes Brother   . Hypertension Brother   . Heart attack Brother        around 71 years of age  . Kidney cancer Brother        stage 4-1 kidney removed  . Colon cancer Neg Hx     PE: BP 140/90   Pulse 62    Ht _0  (1.753 m)   Wt 199 lb (90.3 kg)   SpO2 99%   BMI 29.39 kg/m  Body mass index is 29.39 kg/m.  Wt Readings from Last 3 Encounters:  03/30/20 199 lb (90.3 kg)  11/23/19 203 lb 8 oz (92.3 kg)  09/22/19 208 lb (94.3 kg)   Constitutional: overweight, in NAD Eyes: PERRLA, EOMI, no exophthalmos ENT: moist mucous membranes, no thyromegaly, no cervical lymphadenopathy Cardiovascular: RRR, No MRG Respiratory: CTA B Gastrointestinal: abdomen soft, NT, ND, BS+ Musculoskeletal: no deformities, strength intact in all 4 Skin: moist, warm, no rashes Neurological: no tremor with outstretched hands, DTR normal in all 4  ASSESSMENT: 1. DM2, insulin-dependent, uncontrolled, with complications - mild ckd  - We discussed in the past about a VGO system or another type of pump >> but he works outside and sweats a lot >> he does not think he can wear this - We have discussed about a regular insulin pump in the past, but he cannot use any of the attached devices due to the fact that he has hyperhidrosis. - We did discuss about the possibility to refer him to another endocrinologist, possibly at [facility], for a second opinion, but he refuses.   2. Hypogonadotropic hypogonadism  3.  Hyperprolactinemia  4.  History of elevated TSH  PLAN:  1.  Complex patient with uncontrolled diabetes and high insulin resistance.  His sugars are not significantly affected by any of the changes in his medication regimen that we have tried in the past.  This is probably due to his constant eating/snacking and never being in a fasting state, not even at night.  He also works excessively, 20 hours a day and sleeps very little.  At last visit we added NovoLog.  However, latest HbA1c obtained 07/08/2019 was higher, at 10.9%.  He also gained approximately 17 pounds since last  visit. -At this visit, he tells me that he actually stopped all of his injectable medicines (Ozempic, Tresiba, NovoLog) approximately 2 months ago due to  lack of effect.  However, his HbA1c increased significantly, by 2.5%, so at this point, we discussed that his CBGs at home may not be accurate.  He will change his meter and strips.  We will restart his Ozempic and Tresiba (insulin at low dose and increase as needed).  For now, we will hold off NovoLog but we may need to add insulin in the near future -At this visit, sugars are still high, despite restarting Ozempic and [place].  I feel that we exhausted all of the options for him, but for now the only thing left would be to add back Humalog.  I advised him to start U200 Humalog 10 to 14 units before meals.  We will most likely need to switch back to U500 insulin in the near future.  I think he would do well with an insulin pump with U500. - I suggested to:  Patient Instructions  Please continue: - Metformin 1000 mg 2x daily - Jardiance 25 mg in a.m. - Ozempic 1 mg weekly - Tresiba 80 units daily   Please add: - Humalog U200 10-14 units 15 min before meals  Please change: - Testosterone injection 50 mg weekly into the muscle (draw to the 0.25 ml mark)  Please return in 4 months with your sugar log.   - we checked his HbA1c: 10.5% (slightly lower) - advised to check sugars at different times of the day - 3x a day, rotating check times - advised for yearly eye exams >> he is UTD - return to clinic in 4 months    2. Hypogonadotropic Hypogonadism -Reviewed his pituitary MRI report: Normal -He was on IM injections 50 mg weekly.  On this dose, his testosterone level was normal in 11/2017, however, before last visit he had another testosterone level drawn and this was elevated.  Upon questioning, he was trying the testosterone in the syringe to the 0.5 mL mark, but he does have a 200 mg/mL formulation of testosterone so his dose is actually 100 mg/week.  At that time, I advised him to reduce the dose to 0.25 mL (50 mg) a week.   -At last check, his testosterone level was very low and, upon  questioning, he states that doses every 2 weeks.  At this visit, I advised him to keep the same dose but states it every week.   -No side effects from testosterone replacement.  + Some low libido but no breast tension or hot flashes. -His most recent CBC and PSA were normal in 06/2019 -He has annual DRE's with his urologist.  He does have prostate hypertrophy. 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  At that time we will also check the CBC and PSA.  3.  Hyperprolactinemia  -Due to macroprolactin so no further investigation is needed for this  4. H/o Elevated TSH -TSH was normal at last check: Lab Results  Component Value Date   TSH 1.670 07/08/2019   He needs all labs to go through [facility].  Philemon Kingdom, MD PhD [facility] End[facility]

## 2020-03-31 ENCOUNTER — Other Ambulatory Visit: Payer: Self-pay

## 2020-03-31 MED ORDER — ONETOUCH VERIO VI STRP
ORAL_STRIP | 2 refills | Status: DC
Start: 2020-03-31 — End: 2021-07-14

## 2020-03-31 MED ORDER — ONETOUCH DELICA LANCETS 33G MISC: 3 refills | Status: DC

## 2020-04-13 ENCOUNTER — Telehealth: Payer: Self-pay | Admitting: Internal Medicine

## 2020-04-13 DIAGNOSIS — IMO0002 Reserved for concepts with insufficient information to code with codable children: Secondary | ICD-10-CM

## 2020-04-13 MED ORDER — INSULIN PEN NEEDLE 32G X 4 MM MISC: 11 refills | Status: DC

## 2020-04-13 NOTE — Telephone Encounter (Signed)
Medication Refill Request  Did you call your pharmacy and request this refill first? Yes-requires new RX  If patient has not contacted pharmacy first, instruct them to do so for future refills.   Remind them that contacting the pharmacy for their refill is the quickest method to get the refill.   Refill policy also stated that it will take anywhere between 24-72 hours to receive the refill.    Name of medication?  BD nano 2GEN Pen Needles 32G x 4 mm  Is this a 90 day supply? Not sure  Name and location of pharmacy?  [id number] [place]ann Sheen, [place] - [address] Phone:  [phone]  Fax:  [phone]

## 2020-04-13 NOTE — Telephone Encounter (Signed)
RX sent

## 2020-04-15 ENCOUNTER — Other Ambulatory Visit: Payer: Self-pay | Admitting: Internal Medicine

## 2020-05-11 ENCOUNTER — Other Ambulatory Visit: Payer: Self-pay | Admitting: Internal Medicine

## 2020-05-24 ENCOUNTER — Encounter: Payer: Self-pay | Admitting: Family Medicine

## 2020-05-24 ENCOUNTER — Other Ambulatory Visit: Payer: Self-pay

## 2020-05-24 ENCOUNTER — Ambulatory Visit: Payer: 59 | Admitting: Family Medicine

## 2020-05-24 VITALS — BP 118/72 | HR 71 | Temp 97.9°F | Ht 69.0 in | Wt 207.8 lb

## 2020-05-24 DIAGNOSIS — E1165 Type 2 diabetes mellitus with hyperglycemia: Secondary | ICD-10-CM

## 2020-05-24 DIAGNOSIS — E1121 Type 2 diabetes mellitus with diabetic nephropathy: Secondary | ICD-10-CM

## 2020-05-24 DIAGNOSIS — F411 Generalized anxiety disorder: Secondary | ICD-10-CM | POA: Diagnosis not present

## 2020-05-24 DIAGNOSIS — F331 Major depressive disorder, recurrent, moderate: Secondary | ICD-10-CM | POA: Diagnosis not present

## 2020-05-24 DIAGNOSIS — F5104 Psychophysiologic insomnia: Secondary | ICD-10-CM

## 2020-05-24 DIAGNOSIS — IMO0002 Reserved for concepts with insufficient information to code with codable children: Secondary | ICD-10-CM

## 2020-05-24 DIAGNOSIS — F1120 Opioid dependence, uncomplicated: Secondary | ICD-10-CM | POA: Diagnosis not present

## 2020-05-24 DIAGNOSIS — E782 Mixed hyperlipidemia: Secondary | ICD-10-CM

## 2020-05-24 DIAGNOSIS — F17200 Nicotine dependence, unspecified, uncomplicated: Secondary | ICD-10-CM

## 2020-05-24 NOTE — Assessment & Plan Note (Addendum)
Quit snuff 1 month ago. Up to date with dental care

## 2020-05-24 NOTE — Assessment & Plan Note (Signed)
Repeat referral as never was able to get second opinion. Does not feel his symptoms are well controlled and this may be impacting his DM management. Will refer to psych

## 2020-05-24 NOTE — Patient Instructions (Addendum)
#  Referral I have placed a referral to a specialist for you. You should receive a phone call from the specialty office. Make sure your voicemail is not full and that if you are able to answer your phone to unknown or new numbers.   It may take up to 2 weeks to hear about the referral. If you do not hear anything in 2 weeks, please call our office and ask to speak with the referral coordinator.   Zzzquil (antihistamine)    Sleep hygiene checklist: 1. Avoid naps during the day 2. Avoid stimulants such as caffeine and nicotine. Avoid bedtime alcohol (it can speed onset of sleep but the body's metabolism can cause awakenings). At least 2 hours before bedtime 3. All forms of exercise help ensure sound sleep - limit vigorous exercise to morning or late afternoon 4. Avoid food too close to bedtime including chocolate (which contains caffeine) 5. Soak up natural light 6. Establish regular bedtime routine. 7. Associate bed with sleep - avoid TV, computer or phone, reading while in bed. 8. Ensure pleasant, relaxing sleep environment - quiet, dark, cool room.  Good Sleep Hygiene Habits -- Got to bed and wake up within an hour of the same time every day -- Avoid bright screens (from laptop, phone, TV) within at least 30 minutes before bed. The "blue light" supresses the sleep hormone melatonin and the content may stimulate as well -- Maintain a quiet and dark sleep environment (blackout curtains, turn on a fan or white noise to block out disruptive sounds) -- Practicing relaxing activites before bed (taking a shower, reading a book, journaling, meditation app) -- To quiet a busy mind -- consider journaling before bed (jotting down reminders, worry thoughts, as well as positive things like a gratitude list)   Begin a Mindfulness/Meditation practice -- this can take a little as 3 minutes -- You can find resources in books -- Or you can download apps like  ---- Headspace App (which currently has free  content called "Weathering the Storm") ---- Calm (which has a few free options)  ---- Insignt Timer ---- Stop, Breathe & Think  # With each of these Apps - you should decline the "start free trial" offer and as you search through the App should be able to access some of their free content. You can also chose to pay for the content if you find one that works well for you.   # Many of them also offer sleep specific content which may help with insomnia

## 2020-05-24 NOTE — Assessment & Plan Note (Signed)
Advised trial of zzzquil and could consider hydroxyzine. Given other psych meds would be hesitant to add trazodone and given prior opiate use disorder would want to avoid habit forming. Sleep hygiene handout

## 2020-05-24 NOTE — Assessment & Plan Note (Signed)
Uncontrolled. Appreciate endocrine support. Cont metformin 100 mg, jardiance 25 mg, tresiba 80 units, and humalog 10-14 units mealtime, and ozempic 1 mg

## 2020-05-24 NOTE — Assessment & Plan Note (Signed)
Stable, follows with [facility]

## 2020-05-24 NOTE — Progress Notes (Signed)
Subjective:     Martin Downs is a 54 y.o. male presenting for Follow-up (6 month DM )     HPI  #HLD - continues on fenofibrate and creastor   Endorses forgetting medications about 1 time per month or less  #Diabetes - 236 last night - does not snack through the night - not sleep   #Insomnia - does not sleep well - tried remeron  - difficulty staying asleep - has sleep apnea - could not tolerate any of the fittings for this - OTC - melatonin   Review of Systems  11/23/2019: Clinic - DM and hypogonadism - sees Dr. Elvera Lennox. Depression - not doing well new referral placed 03/30/2020: Endocrine - metformin, jardiance, ozempic, tresiba 80 units ADD humalog 10-14 units before meals Social History   Tobacco Use  Smoking Status Never Smoker  Smokeless Tobacco Former Neurosurgeon  . Types: Snuff        Objective:    BP Readings from Last 3 Encounters:  05/24/20 118/72  03/30/20 140/90  11/23/19 128/62   Wt Readings from Last 3 Encounters:  05/24/20 207 lb 12 oz (94.2 kg)  03/30/20 199 lb (90.3 kg)  11/23/19 203 lb 8 oz (92.3 kg)    BP 118/72   Pulse 71   Temp 97.9 F (36.6 C) (Temporal)   Ht 5\' 9"  (1.753 m)   Wt 207 lb 12 oz (94.2 kg)   SpO2 96%   BMI 30.68 kg/m    Physical Exam Constitutional:      Appearance: Normal appearance. He is not ill-appearing or diaphoretic.  HENT:     Right Ear: External ear normal.     Left Ear: External ear normal.  Eyes:     General: No scleral icterus.    Extraocular Movements: Extraocular movements intact.     Conjunctiva/sclera: Conjunctivae normal.  Cardiovascular:     Rate and Rhythm: Normal rate and regular rhythm.     Heart sounds: No murmur heard.   Pulmonary:     Effort: Pulmonary effort is normal. No respiratory distress.     Breath sounds: Normal breath sounds. No wheezing.  Musculoskeletal:     Cervical back: Neck supple.  Skin:    General: Skin is warm and dry.  Neurological:     Mental Status: He  is alert. Mental status is at baseline.  Psychiatric:        Mood and Affect: Mood normal.           Assessment & Plan:   Problem List Items Addressed This Visit      Endocrine   Uncontrolled type 2 diabetes mellitus with nephropathy (HCC)    Uncontrolled. Appreciate endocrine support. Cont metformin 100 mg, jardiance 25 mg, tresiba 80 units, and humalog 10-14 units mealtime, and ozempic 1 mg        Other   HLD (hyperlipidemia)    LDL at goal on fenofibrate 160 mg and crestor 10 mg. Repeat at next visit.       TOBACCO ABUSE    Quit snuff 1 month ago. Up to date with dental care      INSOMNIA    Advised trial of zzzquil and could consider hydroxyzine. Given other psych meds would be hesitant to add trazodone and given prior opiate use disorder would want to avoid habit forming. Sleep hygiene handout      Generalized anxiety disorder - Primary    Repeat referral as never was able to get second opinion. Does  not feel his symptoms are well controlled and this may be impacting his DM management. Will refer to psych      Relevant Orders   Ambulatory referral to Psychiatry   Long-term current use of methadone for opiate dependence (HCC)    Stable, follows with [facility]      Depression   Relevant Orders   Ambulatory referral to Psychiatry       Return in about 6 months (around 11/22/2020) for for physical.  Lynnda Child, MD  This visit occurred during the SARS-CoV-2 public health emergency.  Safety protocols were in place, including screening questions prior to the visit, additional usage of staff PPE, and extensive cleaning of exam room while observing appropriate contact time as indicated for disinfecting solutions.

## 2020-05-24 NOTE — Assessment & Plan Note (Signed)
LDL at goal on fenofibrate 160 mg and crestor 10 mg. Repeat at next visit.

## 2020-05-25 ENCOUNTER — Other Ambulatory Visit: Payer: Self-pay | Admitting: Internal Medicine

## 2020-08-04 ENCOUNTER — Ambulatory Visit: Payer: 59 | Admitting: Internal Medicine

## 2020-09-13 ENCOUNTER — Ambulatory Visit: Payer: 59 | Admitting: Internal Medicine

## 2020-10-01 ENCOUNTER — Other Ambulatory Visit: Payer: Self-pay | Admitting: Family Medicine

## 2020-10-01 DIAGNOSIS — F419 Anxiety disorder, unspecified: Secondary | ICD-10-CM

## 2020-10-01 DIAGNOSIS — F331 Major depressive disorder, recurrent, moderate: Secondary | ICD-10-CM

## 2020-10-14 ENCOUNTER — Other Ambulatory Visit: Payer: Self-pay | Admitting: Internal Medicine

## 2020-11-21 ENCOUNTER — Ambulatory Visit (INDEPENDENT_AMBULATORY_CARE_PROVIDER_SITE_OTHER): Payer: 59 | Admitting: Internal Medicine

## 2020-11-21 ENCOUNTER — Encounter: Payer: Self-pay | Admitting: Internal Medicine

## 2020-11-21 ENCOUNTER — Other Ambulatory Visit: Payer: Self-pay

## 2020-11-21 VITALS — BP 120/80 | HR 85 | Ht 69.0 in | Wt 193.6 lb

## 2020-11-21 DIAGNOSIS — E1165 Type 2 diabetes mellitus with hyperglycemia: Secondary | ICD-10-CM

## 2020-11-21 DIAGNOSIS — IMO0002 Reserved for concepts with insufficient information to code with codable children: Secondary | ICD-10-CM

## 2020-11-21 DIAGNOSIS — E1121 Type 2 diabetes mellitus with diabetic nephropathy: Secondary | ICD-10-CM | POA: Diagnosis not present

## 2020-11-21 DIAGNOSIS — R7989 Other specified abnormal findings of blood chemistry: Secondary | ICD-10-CM

## 2020-11-21 DIAGNOSIS — E230 Hypopituitarism: Secondary | ICD-10-CM

## 2020-11-21 LAB — POCT GLYCOSYLATED HEMOGLOBIN (HGB A1C): Hemoglobin A1C: 12.2 % — AB (ref 4.0–5.6)

## 2020-11-21 MED ORDER — OZEMPIC (1 MG/DOSE) 2 MG/1.5ML ~~LOC~~ SOPN: 1.0000 mg | PEN_INJECTOR | SUBCUTANEOUS | 3 refills | Status: DC

## 2020-11-21 MED ORDER — METFORMIN HCL 1000 MG PO TABS: ORAL_TABLET | ORAL | 3 refills | Status: DC

## 2020-11-21 MED ORDER — HUMALOG KWIKPEN 200 UNIT/ML ~~LOC~~ SOPN: PEN_INJECTOR | SUBCUTANEOUS | 3 refills | Status: DC

## 2020-11-21 MED ORDER — FREESTYLE LIBRE 2 READER DEVI: 1.0000 | Freq: Every day | 0 refills | Status: DC

## 2020-11-21 MED ORDER — EMPAGLIFLOZIN 25 MG PO TABS: 25.0000 mg | ORAL_TABLET | Freq: Every day | ORAL | 3 refills | Status: DC

## 2020-11-21 MED ORDER — TRESIBA FLEXTOUCH 200 UNIT/ML ~~LOC~~ SOPN: 50.0000 [IU] | PEN_INJECTOR | Freq: Every day | SUBCUTANEOUS | 3 refills | Status: DC

## 2020-11-21 MED ORDER — FREESTYLE LIBRE 2 SENSOR MISC: 1.0000 | 3 refills | Status: DC

## 2020-11-21 NOTE — Patient Instructions (Addendum)
Please continue: - Metformin 1000 mg 2x daily - Jardiance 25 mg in a.m.  Restart: - Ozempic 1 mg weekly - Tresiba 30 units daily (increase up to 50 units if needed) - Humalog U200 10-14 units 15 min before meals  Please increase: - Testosterone injection 50 mg weekly into the muscle (draw to the 0.25 ml mark)  Please return in 1.5 months with your sugar log.

## 2020-11-21 NOTE — Progress Notes (Signed)
Patient ID: Cailen Mihalik, male   DOB: 25-Jul-1966, 55 y.o.   MRN: [id number]  This visit occurred during the SARS-CoV-2 public health emergency.  Safety protocols were in place, including screening questions prior to the visit, additional usage of staff PPE, and extensive cleaning of exam room while observing appropriate contact time as indicated for disinfecting solutions.   HPI: Warrick Llera is a 55 y.o.-year-old male, returning for f/u for DM2 dx 2014, insulin-dependent since 02/2014, uncontrolled, with complications (mild CKD) and hypogonadotropic hypogonadism. Last visit  9 months ago.  Interim history: Before last visit, he started to see psychiatry for depression and anxiety.  He was also referred to a sleep specialist.  He does have OSA and cannot tolerate CPAP. He is now on Trazodone, Seroquel, Celexa, Risperdal. At this visit, he tells me he is again off all of his insulins... He tells me that he did not start this because he felt that everything was futile in controlling the diabetes. He has elbow pain >> may need to have surgery. Now out of work.  DM2: Reviewed HbA1c levels: Lab Results  Component Value Date   HGBA1C 10.5 (A) 03/30/2020   HGBA1C 10.8 (H) 07/08/2019   HGBA1C 8.3 (A) 09/25/2018   He is on: - Basaglar 40 >> Tresiba 60 >> 80 units daily  - did not start - NovoLog 10-15 units 3 times a day before meals-restarted 12/2018  >> Humalog 10 to 14 units 3 times daily before meals - did not start  - Metformin 1000 mg 2x daily - Farxiga >> Jardiance 25 mg in a.m - Ozempic 1 mg weekly  >> did not start We stopped Actoplus 15-850 mg in 02/2014. We stopped Glipizide when we increased Novolog. Insurance does not cover Invokana. We tried to add Actos 15 mg daily (added back 06/2016) >> stopped  We tried U500 insulin.  He was checking his sugars 2-3 times a day, not fasting - now not checking at all. From last OV: - am:  226-465 >> 210-240 >> 243-275 >> 230s  - 2h  after b'fast: n/c >>  239, 247 >> n/c - lunch: 150-200 >> n/c >> 215 >> 230 >> 200s >> 200s - 2h after lunch:  n/c >> 230-280 >> 200-240 >> n/c - dinner: 327-475 >> 200-240 >> 200s >> 200s - 2h after dinner: 220-280 >> 310-425 >> 200-240 >> n/c - bedtime:200-220 >> n/c >> 240-284 >> n/c  - nighttime: 130-218 >> n/c >> 239 >> n/c   Meter: One Touch Ultra mini  Pt's meals are -he grazes throughout the day: - Breakfast: oatmeal or yoghurt or fruit bar/cup (largest meal) - Lunch: PB sandwich - Dinner: chicken leftovers - Snacks: sandwich, 1 pack of crackers, diet Mountain dew  -+ Mild CKD, last BUN/creatinine:  Lab Results  Component Value Date   BUN 12 12/22/2019   CREATININE 0.89 12/22/2019   Lab Results  Component Value Date   MICRALBCREAT 14 07/08/2019   MICRALBCREAT 0.3 05/13/2013   MICRALBCREAT 0.6 01/17/2010   MICRALBCREAT 4.8 12/30/2008   MICRALBCREAT 7.5 09/05/2007  Not on ACE inhibitor.  -+ HL; last set of lipids: Lab Results  Component Value Date   CHOL 138 02/24/2020   HDL 31.50 (L) 02/24/2020   LDLCALC 56 07/08/2019   LDLDIRECT 73.0 02/24/2020   TRIG (H) 02/24/2020    523.0 Triglyceride is over 400; calculations on Lipids are invalid.   CHOLHDL 4 02/24/2020  On Crestor, fenofibrate.  - last eye exam  was on 03/25/2020: No DR  -No numbness and tingling in his feet.  Hypogonadotropic hypogonadism: - He was on Androgel  in 2015 >> his testosterone levels did not increase and he did not feel better  >> he is now on intramuscular testosterone.  At last visit he was on 50 mg weekly, but upon questioning, he is actually taking 100 mg weekly inadvertently, as he was not aware of the testosterone concentration is 200 mg/mL.  Reviewed his testosterone levels: His level from 06/2019 was high on the above dose so we decreased the dose to 50 mg weekly since then but he did not return for further labs. Upon questioning, he decreased the dose as advised but also moved the  injections every other week.  Subsequently, the latest testosterone levels were very low in 11/2019.  Upon questioning, he was taking half of the recommended dose so I advised him to increase the dose to once a week at last visit >> he did not do so.  Component     Latest Ref Rng & Units 12/22/2019  Testosterone, Serum (Total)     ng/dL 15 (L)  % Free Testosterone     % 1.1  Free Testosterone, S     pg/mL 1.7 (L)  Sex Hormone Binding Globulin     nmol/L 45.1   Component     Latest Ref Rng & Units 07/08/2019  Testosterone, Serum (Total)     ng/dL 1,451 (H)  % Free Testosterone     % 3.0  Free Testosterone, S     pg/mL 435 (H)  Sex Hormone Binding Globulin     nmol/L 22.4   Component     Latest Ref Rng & Units 04/30/2017 12/11/2017  Testosterone     264 - 916 ng/dL 1,108 (H) 485  Testosterone Free     7.2 - 24.0 pg/mL 28.6 (H) 14.6  Sex Horm Binding Glob, Serum     19.3 - 76.4 nmol/L 33.0 39.5   PSA levels were normal: Component     Latest Ref Rng & Units 07/08/2019  Prostate Specific Ag, Serum     0.0 - 4.0 ng/mL 0.5   Component     Latest Ref Rng & Units 04/30/2017  Prostate Specific Ag, Serum     0.0 - 4.0 ng/mL 0.7   Lab Results  Component Value Date   PSA 0.69 03/01/2016   PSA 0.91 10/18/2015   PSA 0.37 01/17/2010   PSA 0.55 11/07/2009   His hemoglobin and hematocrit levels were normal: Lab Results  Component Value Date   WBC 8.3 07/08/2019   HGB 16.2 07/08/2019   HCT 48.4 07/08/2019   MCV 92 07/08/2019   PLT 199 07/08/2019   -We checked his pituitary function in 2016 and the labs were normal except for inappropriately normal LH and FSH and also low testosterone.  Prolactin was high but the monomeric prolactin was normal:  Component     Latest Ref Rng 05/16/2015  Testosterone     300 - 890 ng/dL 132 (L)  Sex Hormone Binding     10 - 50 nmol/L 28  Testosterone Free     47.0 - 244.0 pg/mL 26.9 (L)  Testosterone-% Free     1.6 - 2.9 % 2.0  IGF-I,  LC/MS     50 - 317 ng/mL 99  Z-Score (Male)     -2.0-+2.0 SD -0.7  Prolactin, Total     2.0 - 18.0 ng/mL 35.4 (H)  Prolactin, Monomeric  3.4 - 14.8 ng/mL 11.4  LH     1.50 - 9.30 mIU/mL 2.03  FSH     1.4 - 18.1 mIU/ML 5.9  Cortisol, Plasma      14.2  C206 ACTH     6 - 50 pg/mL 32   Previous labs (drawn at 8:33 AM)   Prolactin 38.6 (2.1-17.1)  FSH 5.6, LH 2.0  Total testosterone 134 (300-890), bioavailable testosterone 35.5 ng/dL (130.5-681.7), free testosterone 18 pg/mL (47-244), SHBG 29 (10-50)  PSA 0.16  Estradiol 17.1 (0-39)  Reviewed the report of his pituitary MRI 07/14/2015: No pituitary lesion  Hyperprolactinemia: -  found during investigation for low testosterone, by Dr. Junious Silk, his urologist. -This turned out to be related to macroprolactinoma which does not require further investigation or treatment  No gynecomastia, galactorrhea, breast tenderness.   Patient's alkaline phosphatase was low in 04/2017 (25) so he started a multivitamin.  He has a history of elevated TSH, but latest levels have been normal. Lab Results  Component Value Date   TSH 1.670 07/08/2019   TSH 2.79 11/11/2017   TSH 1.90 03/01/2016   TSH 1.52 05/16/2015   TSH 1.56 02/11/2014   TSH 1.68 05/13/2013   TSH 1.84 03/03/2013   TSH 0.69 12/29/2010   TSH 0.58 04/18/2010   TSH 0.58 01/17/2010   ROS: Constitutional: no weight gain/no weight loss, + fatigue, no subjective hyperthermia, no subjective hypothermia Eyes: no blurry vision, no xerophthalmia ENT: no sore throat, no nodules palpated in neck, no dysphagia, no odynophagia, no hoarseness Cardiovascular: no CP/no SOB/no palpitations/no leg swelling Respiratory: no cough/no SOB/no wheezing Gastrointestinal: no N/no V/no D/no C/no acid reflux Musculoskeletal: no muscle aches/+ joint aches Skin: no rashes, no hair loss Neurological: no tremors/no numbness/no tingling/no dizziness  I reviewed pt's medications, allergies, PMH,  social hx, family hx, and changes were documented in the history of present illness. Otherwise, unchanged from my initial visit note.  Past Medical History:  Diagnosis Date  . Anxiety   . Borderline diabetes   . Chronic pain   . Depression   . Diabetes mellitus without complication ([place])   . Hyperlipidemia   . Narcotic dependence ([place])   . Sleep apnea    no cpap   Past Surgical History:  Procedure Laterality Date  . CHOLECYSTECTOMY N/A 05/30/2016   Procedure: LAPAROSCOPIC CHOLECYSTECTOMY WITH INTRAOPERATIVE CHOLANGIOGRAM;  Surgeon: Donnie Mesa, MD;  Location: [place];  Service: General;  Laterality: N/A;  . CHOLECYSTECTOMY N/A 05/30/2016   Procedure: OPEN COMMON BILE DUCT EXPLORATION; INSERTION OF T-TUBE;  Surgeon: Donnie Mesa, MD;  Location: [place];  Service: General;  Laterality: N/A;  . removal of bone spur Bilateral 1995   elbps  . TOOTH EXTRACTION  2017  . UMBILICAL HERNIA REPAIR N/A 05/30/2016   Procedure: HERNIA REPAIR UMBILICAL ADULT;  Surgeon: Donnie Mesa, MD;  Location: [facility];  Service: General;  Laterality: N/A;   Social History   Socioeconomic History  . Marital status: Married    Spouse name: Amy  . Number of children: 2  . Years of education: Not on file  . Highest education level: High school graduate  Occupational History  . Occupation: Music therapist: UNEMPLOYED  Tobacco Use  . Smoking status: Never Smoker  . Smokeless tobacco: Former Systems developer    Types: Snuff  Vaping Use  . Vaping Use: Never used  Substance and Sexual Activity  . Alcohol use: No    Comment: quit 2009  . Drug use: Not Currently  .  Sexual activity: Yes    Birth control/protection: Post-menopausal  Other Topics Concern  . Not on file  Social History Narrative   11/23/19   From: the area   Living: with wife Amy, high school sweet hearts   Work: Music therapist, works Warehouse manager for a [employer]      Family: 2 children - Wellsite geologist and Sport and exercise psychologist - grown and out of the house       Enjoys: not sure, works too much, watch TV      Exercise: walking at his job - bending/lifting   Diet: tries to do diabetic diet - but only partially, low appetite      Safety   Seat belts: Yes    Guns: declined   Safe in relationships: Yes    Social Determinants of Radio broadcast assistant Strain: Not on file  Food Insecurity: Not on file  Transportation Needs: Not on file  Physical Activity: Not on file  Stress: Not on file  Social Connections: Not on file  Intimate Partner Violence: Not on file   Current Outpatient Medications on File Prior to Visit  Medication Sig Dispense Refill  . ARIPiprazole (ABILIFY) 5 MG tablet Take 5 mg by mouth daily.    Marland Kitchen aspirin 81 MG tablet Take 81 mg by mouth daily.      . BD DISP NEEDLES 18G X 1-1/2" MISC USE TO DRAW UP TESTOSTERONE 4 each 24  . BD INSULIN SYRINGE U/F 31G X 5/16" 0.5 ML MISC USE 3 TIMES DAILY 100 each 0  . Blood Glucose Monitoring Suppl (ONE TOUCH ULTRA MINI) W/DEVICE KIT Use as directed to test blood sugar twice daily 250.00 1 each 0  . buPROPion (WELLBUTRIN XL) 150 MG 24 hr tablet TAKE 3 TABLETS (450 MG TOTAL) BY MOUTH DAILY. 270 tablet 0  . escitalopram (LEXAPRO) 10 MG tablet Take 10 mg by mouth daily.    . fenofibrate 160 MG tablet Take 1 tablet (160 mg total) by mouth daily. 90 tablet 1  . fluticasone (FLONASE) 50 MCG/ACT nasal spray INSTILL 2 SPRAYS INTO EACH NOSTRIL EVERY DAY 16 mL 1  . glucose blood (ONETOUCH VERIO) test strip Use to test blood sugar 4 times daily-Dx code E11.65 400 each 2  . Insulin Degludec (TRESIBA FLEXTOUCH) 200 UNIT/ML SOPN Inject 80 Units into the skin daily. 9 pen 3  . insulin lispro (HUMALOG KWIKPEN) 200 UNIT/ML KwikPen Inject 10-14 units before the 3 meals of the day 18 mL 3  . Insulin Pen Needle 32G X 4 MM MISC Use to inject insulin 4 times daily as instructed. 400 each 11  . Insulin Syringe-Needle U-100 (B-D INSULIN SYRINGE 1CC/25GX1") 25G X 1" 1 ML MISC USE TO INJECT TESTOSTERONE WEEKLY 4  each 99  . JARDIANCE 25 MG TABS tablet TAKE 1 TABLET BY MOUTH EVERY DAY 32 tablet 0  . metFORMIN (GLUCOPHAGE) 1000 MG tablet TAKE 1 TABLET BY MOUTH TWICE DAILY. **MUST BE SEEN FOR FUTURE REFILL** 180 tablet 1  . methadone (DOLOPHINE) 10 MG/ML solution Take 180 mg by mouth daily.     . Multiple Vitamin (MULTIVITAMIN) tablet Take 2 tablets by mouth 2 (two) times daily.    Glory Rosebush Delica Lancets 40J MISC USE TO TEST BLOOD SUGAR 4 TIMES DAILY-DX code E11.65 200 each 3  . rosuvastatin (CRESTOR) 10 MG tablet Take 1qd 90 tablet 3  . Semaglutide, 1 MG/DOSE, (OZEMPIC, 1 MG/DOSE,) 2 MG/1.5ML SOPN Inject 1 mg into the skin once a week. 6 pen  3  . SYRINGE-NEEDLE, DISP, 3 ML (B-D 3CC LUER-LOK SYR 18GX1-1/2) 18G X 1-1/2" 3 ML MISC USE TO DRAW UP TESTOSTERONE 1 each 1  . Syringe/Needle, Disp, 18G X 1" 1 ML MISC Use to draw up testosterone 100 each 5  . testosterone cypionate (DEPOTESTOSTERONE CYPIONATE) 200 MG/ML injection INJECT 0.25MLS INTO THE MUSCLE ONCE WEEKLY 1 mL 1  . [DISCONTINUED] insulin aspart (NOVOLOG FLEXPEN) 100 UNIT/ML FlexPen Inject 10-15 Units into the skin 3 (three) times daily with meals. 30 mL 5   No current facility-administered medications on file prior to visit.   Allergies  Allergen Reactions  . Gemfibrozil Diarrhea   Family History  Problem Relation Age of Onset  . Hypertension Mother   . Cancer Father        not sure of type  . Diabetes Father   . Hypertension Father   . Heart attack Father 83  . Diabetes Brother   . Hypertension Brother   . Heart attack Brother        around 82 years of age  . Kidney cancer Brother        stage 4-1 kidney removed  . Colon cancer Neg Hx     PE: BP 120/80 (BP Location: Right Arm, Patient Position: Sitting, Cuff Size: Normal)   Pulse 85   Ht 5' 9" (1.753 m)   Wt 193 lb 9.6 oz (87.8 kg)   SpO2 97%   BMI 28.59 kg/m  Body mass index is 28.59 kg/m.  Wt Readings from Last 3 Encounters:  11/21/20 193 lb 9.6 oz (87.8 kg)  05/24/20  207 lb 12 oz (94.2 kg)  03/30/20 199 lb (90.3 kg)   Constitutional: overweight, in NAD Eyes: PERRLA, EOMI, no exophthalmos ENT: moist mucous membranes, no thyromegaly, no cervical lymphadenopathy Cardiovascular: RRR, No MRG Respiratory: CTA B Gastrointestinal: abdomen soft, NT, ND, BS+ Musculoskeletal: no deformities, strength intact in all 4 Skin: moist, warm, no rashes Neurological: no tremor with outstretched hands, DTR normal in all 4  ASSESSMENT: 1. DM2, insulin-dependent, uncontrolled, with complications - mild ckd  - We discussed in the past about a VGO system or another type of pump >> but he works outside and sweats a lot >> he does not think he can wear this - We have discussed about a regular insulin pump in the past, but he cannot use any of the attached devices due to the fact that he has hyperhidrosis. - We did discuss about the possibility to refer him to another endocrinologist, possibly at [facility], for a second opinion, but he refuses.   2. Hypogonadotropic hypogonadism  3.  Hyperprolactinemia  4.  History of elevated TSH  PLAN:  1.  Complex patient with uncontrolled diabetes and high insulin resistance.  His sugars are not significantly affected by any of the changes in his medication regimen that we have tried in the past.  This is probably due to his constant eating/snacking and never being in the fasting state, not even at night.  He also continues to work excessively, approximately 20 hours a day and to sleep very little.  At last visit, we added Humalog U200.  At that time, he told me that he actually stopped all of his injectable medicines (Ozempic, Tresiba, NovoLog) approximately 2 weeks prior to the visit due to lack of effect.  However, his HbA1c increased by 2.5%, still we discussed that his CBGs at home may not be accurate and to restart Ozempic, Tresiba, and added Humalog.  We discussed  that we may also need to switch to U500 insulin in the future.  He has been  on this in the past. Latest HbA1c was slightly better, at 10.5% at last visit. - we checked his HbA1c: 12.2% (MUCH HIGHER) -At this visit, he is not checking sugars at all, did not start any of the insulins for the Ozempic as he mentions " my sugars are high no matter what, so why take all these?".  I explained that his HbA1c increased from 8.3% 2 years ago to 12.2% now, so by 4% so even if the sugars were high in the past, now they are abysmal.  I recommended a CGM and send the prescription for the freestyle libre CGM to his pharmacy.  I also strongly advised him to start insulins as discussed at last visit.  I did suggest a lower Tresiba dose but decrease the dose as needed.  She reluctantly agrees to start these. -I am worried about him!  He does appear to be depressed and even though he is working with a counselor/psychotherapist, he does not appear to be interested in improving his diabetes control.  We discussed about possible complications from uncontrolled diabetes.  I believe he is at high risk of developing them. - I suggested to:  Patient Instructions  Please continue: - Metformin 1000 mg 2x daily - Jardiance 25 mg in a.m.  Restart: - Ozempic 1 mg weekly - Tresiba 30 units daily (increase up to 50 units if needed) - Humalog U200 10-14 units 15 min before meals  Please increase: - Testosterone injection 50 mg weekly into the muscle (draw to the 0.25 ml mark)  Please return in 1.5 months with your sugar log.   - advised to check sugars at different times of the day - 3-4x a day, rotating check times - advised for yearly eye exams >> he is UTD - return to clinic in 1.5 months   2. Hypogonadotropic Hypogonadism -His pituitary MRI was normal-no pituitary tumor -He was on IM injections 50 mg weekly.  On this dose, his testosterone level was normal in 11/2017, however, after this, a testosterone level was elevated.  Upon questioning, he was trying the testosterone in the syringe to the  0.5 mL mark, but he does have a 200 mg/mL formulation of testosterone so his dose was actually 100 mg/week.  At that time, I advised him to reduce the dose to 0.25 mL (50 mg) for a week -Before last visit, his testosterone level was very low and, upon questioning, he was taking the recommended dose every 2 weeks rather than once a week.  At last visit, I advised him to keep the same dose but to take it every week.  He forgot.  He still taking it every 2 weeks.  I again advised him at this visit to move it every week. -No side effects from testosterone replacement.  He has some low libido but no breast tension or hot flashes. -His most recent CBC and PSA were normal in 06/2019 -He has annual DRE's with his urologist.  He does have prostate hypertrophy -He is due for testosterone recheck fasting, upon waking up.  He also needs a CBC and a PSA.  I advised him to schedule the next appointment in the morning so we can check these fasting.  3.  Hyperprolactinemia  -This is due to macro prolactin-no further investigation needed  4. H/o Elevated TSH -TSH was normal at last check: Lab Results  Component Value Date  TSH 1.670 07/08/2019  -We will need to repeat the lab >> we will do so at next visit  He needs all labs to go through [facility].  Philemon Kingdom, MD PhD [facility] Endocrinology

## 2020-11-23 ENCOUNTER — Ambulatory Visit (INDEPENDENT_AMBULATORY_CARE_PROVIDER_SITE_OTHER): Payer: 59 | Admitting: Family Medicine

## 2020-11-23 ENCOUNTER — Other Ambulatory Visit: Payer: Self-pay

## 2020-11-23 VITALS — BP 130/80 | HR 75 | Temp 97.8°F | Ht 69.0 in | Wt 185.0 lb

## 2020-11-23 DIAGNOSIS — IMO0002 Reserved for concepts with insufficient information to code with codable children: Secondary | ICD-10-CM

## 2020-11-23 DIAGNOSIS — E1121 Type 2 diabetes mellitus with diabetic nephropathy: Secondary | ICD-10-CM

## 2020-11-23 DIAGNOSIS — Z125 Encounter for screening for malignant neoplasm of prostate: Secondary | ICD-10-CM

## 2020-11-23 DIAGNOSIS — Z1211 Encounter for screening for malignant neoplasm of colon: Secondary | ICD-10-CM

## 2020-11-23 DIAGNOSIS — E1165 Type 2 diabetes mellitus with hyperglycemia: Secondary | ICD-10-CM

## 2020-11-23 DIAGNOSIS — Z0000 Encounter for general adult medical examination without abnormal findings: Secondary | ICD-10-CM | POA: Diagnosis not present

## 2020-11-23 DIAGNOSIS — E039 Hypothyroidism, unspecified: Secondary | ICD-10-CM | POA: Diagnosis not present

## 2020-11-23 DIAGNOSIS — E782 Mixed hyperlipidemia: Secondary | ICD-10-CM | POA: Diagnosis not present

## 2020-11-23 LAB — CBC WITH DIFFERENTIAL/PLATELET
Basophils Absolute: 0.1 10*3/uL (ref 0.0–0.1)
Basophils Relative: 1.4 % (ref 0.0–3.0)
Eosinophils Absolute: 0.2 10*3/uL (ref 0.0–0.7)
Eosinophils Relative: 4 % (ref 0.0–5.0)
HCT: 42.3 % (ref 39.0–52.0)
Hemoglobin: 14.2 g/dL (ref 13.0–17.0)
Lymphocytes Relative: 37.8 % (ref 12.0–46.0)
Lymphs Abs: 1.6 10*3/uL (ref 0.7–4.0)
MCHC: 33.5 g/dL (ref 30.0–36.0)
MCV: 90.7 fl (ref 78.0–100.0)
Monocytes Absolute: 0.6 10*3/uL (ref 0.1–1.0)
Monocytes Relative: 13.2 % — ABNORMAL HIGH (ref 3.0–12.0)
Neutro Abs: 1.8 10*3/uL (ref 1.4–7.7)
Neutrophils Relative %: 43.6 % (ref 43.0–77.0)
Platelets: 195 10*3/uL (ref 150.0–400.0)
RBC: 4.67 Mil/uL (ref 4.22–5.81)
RDW: 13.3 % (ref 11.5–15.5)
WBC: 4.2 10*3/uL (ref 4.0–10.5)

## 2020-11-23 LAB — COMPREHENSIVE METABOLIC PANEL
ALT: 15 U/L (ref 0–53)
AST: 11 U/L (ref 0–37)
Albumin: 4.5 g/dL (ref 3.5–5.2)
Alkaline Phosphatase: 56 U/L (ref 39–117)
BUN: 11 mg/dL (ref 6–23)
CO2: 32 mEq/L (ref 19–32)
Calcium: 10 mg/dL (ref 8.4–10.5)
Chloride: 98 mEq/L (ref 96–112)
Creatinine, Ser: 0.89 mg/dL (ref 0.40–1.50)
GFR: 95.76 mL/min (ref >60.00)
Glucose, Bld: 356 mg/dL — ABNORMAL HIGH (ref 70–99)
Potassium: 4.5 mEq/L (ref 3.5–5.1)
Sodium: 137 mEq/L (ref 135–145)
Total Bilirubin: 0.8 mg/dL (ref 0.2–1.2)
Total Protein: 6.7 g/dL (ref 6.0–8.3)

## 2020-11-23 LAB — LDL CHOLESTEROL, DIRECT: Direct LDL: 102 mg/dL

## 2020-11-23 LAB — MICROALBUMIN / CREATININE URINE RATIO
Creatinine,U: 24.9 mg/dL
Microalb Creat Ratio: 2.8 mg/g (ref 0.0–30.0)
Microalb, Ur: <0.7 mg/dL (ref 0.0–1.9)

## 2020-11-23 LAB — PSA: PSA: 0.53 ng/mL (ref 0.10–4.00)

## 2020-11-23 LAB — LIPID PANEL
Cholesterol: 170 mg/dL (ref 0–200)
HDL: 35.6 mg/dL — ABNORMAL LOW (ref >39.00)
Total CHOL/HDL Ratio: 5
Triglycerides: 479 mg/dL — ABNORMAL HIGH (ref 0.0–149.0)

## 2020-11-23 LAB — TSH: TSH: 1.93 u[IU]/mL (ref 0.35–4.50)

## 2020-11-23 NOTE — Patient Instructions (Addendum)
Ulnar Nerve Contusion  The ulnar nerve extends from the shoulder to the small finger (pinkie) of the hand. This nerve provides feeling (sensation) to the pinkie and half of the ring finger. It also controls many hand and forearm muscles that let you grip objects. An ulnar nerve contusion is a bruise of the ulnar nerve at a point close to the elbow. An ulnar nerve contusion can cause a loss of feeling or movement in your hand. It can also affect your ability to use the muscles that allow you to grip objects. What are the causes? This condition may be caused by:  A hit to the elbow.  Falling on your elbow.  Shoving your elbow against a hard surface. What increases the risk? This condition is more likely to develop in people who:  Play contact sports, like football.  Have a disorder that increases the risk of bleeding.  Take blood thinning medicine, such as warfarin. What are the signs or symptoms? Symptoms of this condition include:  Tingling or numbness in the hand, especially in the pinkie or ring finger.  Weakness while moving the hand from side to side in a waving motion or while moving your fingers together.  Abnormal claw-like hand position or deformity. How is this diagnosed? This condition may be diagnosed based on your symptoms, your medical history, and a physical exam. You may have tests, such as:  An X-ray to check for broken bones.  An electromyogram (EMG) to see how well your nerves are working.  A nerve conduction study (NCS) to see how electrical signals pass through your nerves.  An MRI to find the source of any nerve problems. How is this treated? This condition usually heals on its own within 6 weeks. Treatment can help to reduce symptoms and may include:  Wearing a brace or splint at night to keep your elbow straight while you sleep.  Taking NSAIDs, such as ibuprofen, to reduce pain and swelling around the nerve.  Working with a physical therapist to  ease stiffness in your elbow and wrist. Follow these instructions at home: If you have a brace or splint:  Wear it as told by your health care provider. Remove it only as told by your health care provider.  Loosen it if your fingers tingle, become numb, or turn cold and blue.  Keep it clean.  If it is not waterproof: ? Do not let it get wet. ? Cover it with a watertight covering when you take a bath or shower. Managing pain, stiffness, and swelling  If directed, put ice on the injured area. ? If you have a removable brace or splint, remove it as told by your health care provider. ? Put ice in a plastic bag. ? Place a towel between your skin and the bag. ? Leave the ice on for 20 minutes, 2-3 times a day.  Move your fingers often to reduce stiffness and swelling.  Raise (elevate) the injured area above the level of your heart while you are sitting or lying down.   Activity  Return to your normal activities as told by your health care provider. Ask your health care provider what activities are safe for you.  Avoid activities that take a lot of effort (strenuous) for as long as told by your health care provider.  Do exercises as told by your health care provider. General instructions  Do not use the injured limb to support your body weight until your health care provider says that you  can.  Do not use any products that contain nicotine or tobacco, such as cigarettes, e-cigarettes, and chewing tobacco. If you need help quitting, ask your health care provider.  Take over-the-counter and prescription medicines only as told by your health care provider.  Keep all follow-up visits as told by your health care provider. This is important. How is this prevented?  Be safe and responsible while being active to avoid falls.  Use protective equipment during sports activities, such as elbow pads. Contact a health care provider if your:  Pain does not improve or it gets  worse.  Swelling does not improve or it gets worse.  Grip becomes weaker or you start to drop things easily. Get help right away if you:  Have severe pain.  Have severe swelling.  Cannot move your wrist, hand, or elbow.  Cannot feel parts of your hand, wrist, or arm. Summary  An ulnar nerve contusion is a bruise of the ulnar nerve at a point close to the elbow. The ulnar nerve extends from the shoulder to the small finger (pinkie) of the hand.  This injury may be caused by a hit or a fall on your elbow. You are more likely to get this injury if you play contact sports.  If you have a brace or splint, wear it and remove it as told by your health care provider. Keep it clean and dry.  To help with the symptoms of pain, stiffness, and swelling, put ice on the injured area or take NSAIDs as directed by your health care provider.  Get help right away if you have severe pain or severe swelling, or if you cannot feel your hand, wrist, or arm. This information is not intended to replace advice given to you by your health care provider. Make sure you discuss any questions you have with your health care provider. Document Revised: 09/11/2018 Document Reviewed: 09/11/2018 Elsevier Patient Education  2021 Elsevier Inc.   Continue seeing endocrinology and taking your medication    Depression gets better best with therapy and medication. Below are some therapy options   [place] location:  [facility] [phone]-  -Counseling/therapy (14 years and up- Medicaid insurance only)   -Psychiatry services most insurances accepted-treat and evaluate/diagnose patients and prescribe medications.  -Medication Management 57 years old to 65 years.  -Diagnoses treated: Depression/anxiety, ADHD, Substance Abuse, Bipolar Disorder, Psychotherapy, Pain Management, Spiritual Care Services.  [facility] (57 years of age and older) [phone]- Psychology services/therapy only.  [employer] [phone]- All ages-Just Therapy services   [employer] [phone]- Employee Assistance Programs-counseling only.   [facility] [phone]- All ages-Psychology and Psychiatrist services (evaluate, treat, diagnose, prescribe medication) Treat all the diagnoses that would fall under Behavioral issues.   For new patients, on Monday, Wednesday and Friday from 8 am to 3 pm patient would just walk in and fill out paperwork and they would get patient enrolled in the services they need based on their answers.   [employer] [phone]- All ages. Monday through Friday-9am to 4 pm walk in times for patients to come in and be evaluated. Medication Management only at this time. No therapy available.   [place] location:   [facility] Address: [address] [place] [address] Phone: [phone] Counseling and psychiatry services.   [facility] and [place] locations)- [phone]- all ages, only therapy/counseling services/psychological evaluations. Services: aptitude testing, academic achievement testing, learning Disability evaluation, ADHD evaluations, psycho-educational evaluations,  readiness for kindergarten Evaluations.   [place] [phone] location only has therapy services right now  [facility] [phone] services only. Works off [employer] in [employer].

## 2020-11-23 NOTE — Progress Notes (Signed)
Annual Exam   Chief Complaint:  Chief Complaint  Patient presents with  . Annual Exam  . Numbness    In 2 fingers of R hand     History of Present Illness:  Martin Downs is a 55 y.o. presents today for annual examination.    #Right hand numbness - 4th and 5th digit - 2 months - constant symptoms - does not rest the right arm on anything - sleeps on his side with his right arm bent and resting on a pillow - no arm, elbow, wrist pain   Nutrition/Lifestyle Diet: eating what his partner, does not follow a diabetic diet Exercise: not currently He is single partner, contraception - post menopausal status.  Any issues with getting or keeping erection? Yes  Social History   Tobacco Use  Smoking Status Never Smoker  Smokeless Tobacco Former Systems developer  . Types: Snuff   Social History   Substance and Sexual Activity  Alcohol Use No   Comment: quit 2009   Social History   Substance and Sexual Activity  Drug Use Not Currently     Safety The patient wears seatbelts: yes.     The patient feels safe at home and in their relationships: yes.  General Health Dentist in the last year: Yes Eye doctor: yes  Weight Wt Readings from Last 3 Encounters:  11/23/20 185 lb (83.9 kg)  11/21/20 193 lb 9.6 oz (87.8 kg)  05/24/20 207 lb 12 oz (94.2 kg)   Patient has normal BMI  BMI Readings from Last 1 Encounters:  11/23/20 27.32 kg/m     Chronic disease screening Blood pressure monitoring:  BP Readings from Last 3 Encounters:  11/23/20 130/80  11/21/20 120/80  05/24/20 118/72    Lipid Monitoring: Indication for screening: age >35, obesity, diabetes, family hx, CV risk factors.  Lipid screening: Yes  Lab Results  Component Value Date   CHOL 138 02/24/2020   HDL 31.50 (L) 02/24/2020   LDLCALC 56 07/08/2019   LDLDIRECT 73.0 02/24/2020   TRIG (H) 02/24/2020    523.0 Triglyceride is over 400; calculations on Lipids are invalid.   CHOLHDL 4 02/24/2020      Diabetes Screening: age >51, overweight, family hx, PCOS, hx of gestational diabetes, at risk ethnicity, elevated blood pressure >135/80.  Diabetes Screening screening: Not Indicated  Lab Results  Component Value Date   HGBA1C 12.2 (A) 11/21/2020     Prostate Cancer Screening: Yes Age 58-69 yo Shared Decision Making Higher Risk: Older age, African American, Family Hx of Prostate Cancer - No Benefits: screening may prevent 1.3 deaths from prostate cancer over 13 years per 1000 men screened and prevent 3 metastatic cases per 1000 men screened. Not enough evidence to support more benefit for AA or [place] Harms: False Positive and psychological harms. 15% of me with false positive over a 2 to 4 year period > resulting in biopsy and complications such as pain, hematospermia, infections. Overdiagnosis - increases with age - found that 20-50% of prostate cancer through screening may have never caused any issues. Harms of treatment include - erectile dysfunction, urinary incontinence, and bothersome bowel symptoms.   After discussion he does want to get a PSA checked today.   Inadequate evidence for screening <55 No mortality benefit for screening >70   Lab Results  Component Value Date   PSA1 0.5 07/08/2019   PSA1 0.7 04/30/2017   PSA 0.69 03/01/2016   PSA 0.91 10/18/2015   PSA 0.37 01/17/2010  Colon Cancer Screening:  Age 18-75 yo - benefits outweigh the risk. Adults 46-85 yo who have never been screened benefit.  Benefits: 134000 people in 2016 will be diagnosed and 49,000 will die - early detection helps Harms: Complications 2/2 to colonoscopy High Risk (Colonoscopy): genetic disorder (Lynch syndrome or familial adenomatous polyposis), personal hx of IBD, previous adenomatous polyp, or previous colorectal cancer, FamHx start 10 years before the age at diagnosis, increased in males and black race  Options:  FIT - looks for hemoglobin (blood in the stool) - specific and  fairly sensitive - must be done annually Cologuard - looks for DNA and blood - more sensitive - therefore can have more false positives, every 3 years Colonoscopy - every 10 years if normal - sedation, bowl prep, must have someone drive you  Shared decision making and the patient had decided to do colonoscopy - referral placed.   Social History   Tobacco Use  Smoking Status Never Smoker  Smokeless Tobacco Former Systems developer  . Types: Snuff    Lung Cancer Screening (Ages 01-74): not applicable    Past Medical History:  Diagnosis Date  . Anxiety   . Borderline diabetes   . Chronic pain   . Depression   . Diabetes mellitus without complication ([place])   . Hyperlipidemia   . Narcotic dependence ([place])   . Sleep apnea    no cpap    Past Surgical History:  Procedure Laterality Date  . CHOLECYSTECTOMY N/A 05/30/2016   Procedure: LAPAROSCOPIC CHOLECYSTECTOMY WITH INTRAOPERATIVE CHOLANGIOGRAM;  Surgeon: Donnie Mesa, MD;  Location: [place];  Service: General;  Laterality: N/A;  . CHOLECYSTECTOMY N/A 05/30/2016   Procedure: OPEN COMMON BILE DUCT EXPLORATION; INSERTION OF T-TUBE;  Surgeon: Donnie Mesa, MD;  Location: [place];  Service: General;  Laterality: N/A;  . removal of bone spur Bilateral 1995   elbps  . TOOTH EXTRACTION  2017  . UMBILICAL HERNIA REPAIR N/A 05/30/2016   Procedure: HERNIA REPAIR UMBILICAL ADULT;  Surgeon: Donnie Mesa, MD;  Location: [place];  Service: General;  Laterality: N/A;    Prior to Admission medications   Medication Sig Start Date End Date Taking? Authorizing Provider  aspirin 81 MG tablet Take 81 mg by mouth daily.    [provider]  BD DISP NEEDLES 18G X 1-1/2" MISC USE TO DRAW UP TESTOSTERONE 08/26/19   Philemon Kingdom, MD  BD INSULIN SYRINGE U/F 31G X 5/16" 0.5 ML MISC USE 3 TIMES DAILY 09/19/17   Philemon Kingdom, MD  Blood Glucose Monitoring Suppl (ONE TOUCH ULTRA MINI) W/DEVICE KIT Use as directed to test blood sugar twice daily 250.00 04/29/14    Lucille Passy, MD  Continuous Blood Gluc Receiver (FREESTYLE LIBRE 2 READER) DEVI 1 each by Does not apply route daily. 11/21/20   Philemon Kingdom, MD  Continuous Blood Gluc Sensor (FREESTYLE LIBRE 2 SENSOR) MISC 1 each by Does not apply route every 14 (fourteen) days. 11/21/20   Philemon Kingdom, MD  divalproex (DEPAKOTE ER) 500 MG 24 hr tablet Take by mouth. 10/13/20   [provider]  empagliflozin (JARDIANCE) 25 MG TABS tablet Take 1 tablet (25 mg total) by mouth daily. 11/21/20   Philemon Kingdom, MD  escitalopram (LEXAPRO) 10 MG tablet Take 10 mg by mouth daily.    [provider]  fenofibrate 160 MG tablet Take 1 tablet (160 mg total) by mouth daily. 02/25/20   Lesleigh Noe, MD  fluticasone (FLONASE) 50 MCG/ACT nasal spray INSTILL 2 SPRAYS INTO  EACH NOSTRIL EVERY DAY 09/22/19   Lesleigh Noe, MD  glucose blood [facility] VERIO) test strip Use to test blood sugar 4 times daily-Dx code E11.65 03/31/20   Philemon Kingdom, MD  hydrOXYzine (VISTARIL) 25 MG capsule Take 25 mg by mouth 2 (two) times daily. 10/24/20   [provider]  insulin degludec (TRESIBA FLEXTOUCH) 200 UNIT/ML FlexTouch Pen Inject 50 Units into the skin daily. 11/21/20   Philemon Kingdom, MD  insulin lispro (HUMALOG KWIKPEN) 200 UNIT/ML KwikPen Inject 10-14 units before the 3 meals of the day 11/21/20   Philemon Kingdom, MD  Insulin Pen Needle 32G X 4 MM MISC Use to inject insulin 4 times daily as instructed. 04/13/20   Philemon Kingdom, MD  Insulin Syringe-Needle U-100 (B-D INSULIN SYRINGE 1CC/25GX1") 25G X 1" 1 ML MISC USE TO INJECT TESTOSTERONE WEEKLY 08/26/19   Philemon Kingdom, MD  metFORMIN (GLUCOPHAGE) 1000 MG tablet Take 1 tablet 2x a day 11/21/20   Philemon Kingdom, MD  methadone (DOLOPHINE) 10 MG/ML solution Take 180 mg by mouth daily.     [provider]  Multiple Vitamin (MULTIVITAMIN) tablet Take 2 tablets by mouth 2 (two) times daily.    [provider]  OneTouch  Delica Lancets 48J MISC USE TO TEST BLOOD SUGAR 4 TIMES DAILY-DX code E11.65 03/31/20   Philemon Kingdom, MD  predniSONE (DELTASONE) 5 MG tablet PLEASE SEE ATTACHED FOR DETAILED DIRECTIONS 09/27/20   [provider]  risperiDONE (RISPERDAL) 0.5 MG tablet Take 0.5 mg by mouth 2 (two) times daily. 10/19/20   [provider]  rosuvastatin (CRESTOR) 10 MG tablet Take 1qd 09/22/19   Lesleigh Noe, MD  Semaglutide, 1 MG/DOSE, (OZEMPIC, 1 MG/DOSE,) 2 MG/1.5ML SOPN Inject 1 mg into the skin once a week. 11/21/20   Philemon Kingdom, MD  SYRINGE-NEEDLE, DISP, 3 ML (B-D 3CC LUER-LOK SYR 18GX1-1/2) 18G X 1-1/2" 3 ML MISC USE TO DRAW UP TESTOSTERONE 11/14/18   Philemon Kingdom, MD  Syringe/Needle, Disp, 18G X 1" 1 ML MISC Use to draw up testosterone 03/04/17   Philemon Kingdom, MD  testosterone cypionate (DEPOTESTOSTERONE CYPIONATE) 200 MG/ML injection INJECT 0.25MLS INTO THE MUSCLE ONCE WEEKLY 03/21/18   Philemon Kingdom, MD  traZODone (DESYREL) 50 MG tablet Take 50 mg by mouth at bedtime. 10/26/20   [provider]  insulin aspart (NOVOLOG FLEXPEN) 100 UNIT/ML FlexPen Inject 10-15 Units into the skin 3 (three) times daily with meals. 01/23/19 11/23/19  Philemon Kingdom, MD    Allergies  Allergen Reactions  . Gemfibrozil Diarrhea     Social History   Socioeconomic History  . Marital status: Married    Spouse name: Amy  . Number of children: 2  . Years of education: Not on file  . Highest education level: High school graduate  Occupational History  . Occupation: Music therapist: UNEMPLOYED  Tobacco Use  . Smoking status: Never Smoker  . Smokeless tobacco: Former Systems developer    Types: Snuff  Vaping Use  . Vaping Use: Never used  Substance and Sexual Activity  . Alcohol use: No    Comment: quit 2009  . Drug use: Not Currently  . Sexual activity: Yes    Birth control/protection: Post-menopausal  Other Topics Concern  . Not on file  Social History Narrative   11/23/19    From: the area   Living: with wife Amy, high school [employer] hearts   Work: Music therapist, works Warehouse manager for a [employer]      Family:  2 children - Ria Comment and Cisco - grown and out of the house      Enjoys: not sure, works too much, watch TV      Exercise: walking at his job - bending/lifting   Diet: tries to do diabetic diet - but only partially, low appetite      Safety   Seat belts: Yes    Guns: declined   Safe in relationships: Yes    Social Determinants of Radio broadcast assistant Strain: Not on file  Food Insecurity: Not on file  Transportation Needs: Not on file  Physical Activity: Not on file  Stress: Not on file  Social Connections: Not on file  Intimate Partner Violence: Not on file    Family History  Problem Relation Age of Onset  . Hypertension Mother   . Cancer Father        not sure of type  . Diabetes Father   . Hypertension Father   . Heart attack Father 53  . Diabetes Brother   . Hypertension Brother   . Heart attack Brother        around 68 years of age  . Kidney cancer Brother        stage 4-1 kidney removed  . Colon cancer Neg Hx     Review of Systems  Constitutional: Negative for chills and fever.  HENT: Negative for congestion and sore throat.   Eyes: Negative for blurred vision and double vision.  Respiratory: Negative for shortness of breath.   Cardiovascular: Negative for chest pain.  Gastrointestinal: Negative for heartburn, nausea and vomiting.  Genitourinary: Negative.   Musculoskeletal: Negative.  Negative for myalgias.  Skin: Negative for rash.  Neurological: Negative for dizziness and headaches.  Endo/Heme/Allergies: Does not bruise/bleed easily.  Psychiatric/Behavioral: Negative for depression. The patient is not nervous/anxious.      Physical Exam BP 130/80   Pulse 75   Temp 97.8 F (36.6 C) (Temporal)   Ht _0  (1.753 m)   Wt 185 lb (83.9 kg)   SpO2 96%   BMI 27.32 kg/m    BP Readings from Last 3  Encounters:  11/23/20 130/80  11/21/20 120/80  05/24/20 118/72      Physical Exam Constitutional:      General: He is not in acute distress.    Appearance: He is well-developed. He is not diaphoretic.  HENT:     Head: Normocephalic and atraumatic.     Right Ear: Tympanic membrane and ear canal normal.     Left Ear: Tympanic membrane and ear canal normal.     Nose: Nose normal.     Mouth/Throat:     Pharynx: Uvula midline.  Eyes:     General: No scleral icterus.    Conjunctiva/sclera: Conjunctivae normal.     Pupils: Pupils are equal, round, and reactive to light.  Cardiovascular:     Rate and Rhythm: Normal rate and regular rhythm.     Heart sounds: Normal heart sounds. No murmur heard.   Pulmonary:     Effort: Pulmonary effort is normal. No respiratory distress.     Breath sounds: Normal breath sounds. No wheezing.  Abdominal:     General: Bowel sounds are normal. There is no distension.     Palpations: Abdomen is soft. There is no mass.     Tenderness: There is no abdominal tenderness. There is no guarding.  Musculoskeletal:        General: Normal range of motion.  Cervical back: Normal range of motion and neck supple.     Comments: Right wrist/elbow Inspection: no swelling or erythema  Palpation: no wrist, elbow, hand ttp ROM: normal wrist/elbow rom Strength: normal strength w/o pain Tinel negative elbow and wrist.   Lymphadenopathy:     Cervical: No cervical adenopathy.  Skin:    General: Skin is warm and dry.     Capillary Refill: Capillary refill takes less than 2 seconds.  Neurological:     Mental Status: He is alert and oriented to person, place, and time.  Psychiatric:        Attention and Perception: Attention normal.        Mood and Affect: Mood is depressed.        Results:  PHQ-9:  [place] Office Visit from 11/23/2020 in [place] at [place]  PHQ-9 Total Score 18        Assessment: 55 y.o. here for routine annual  physical examination.  Plan: Problem List Items Addressed This Visit      Endocrine   Hypothyroidism   Relevant Orders   TSH   Uncontrolled type 2 diabetes mellitus with nephropathy ([place])   Relevant Orders   Comprehensive metabolic panel   Microalbumin / creatinine urine ratio     Other   HLD (hyperlipidemia)   Relevant Orders   Comprehensive metabolic panel   Lipid panel    Other Visit Diagnoses    Prostate cancer screening    -  Primary   Relevant Orders   PSA   Colon cancer screening       Relevant Orders   Ambulatory referral to gastroenterology for colonoscopy   Annual physical exam       Relevant Orders   Comprehensive metabolic panel   CBC with Differential      Screening: -- Blood pressure screen normal -- cholesterol screening: will obtain -- Weight screening: overweight: continue to monitor -- Diabetes Screening: not due for screening -- Nutrition: normal - Encouraged healthy diet  The 10-year ASCVD risk score Mikey Bussing DC Jr., et al., 2013) is: 11.6%   Values used to calculate the score:     Age: 50 years     Sex: Male     Is Non-Hispanic African American: No     Diabetic: Yes     Tobacco smoker: No     Systolic Blood Pressure: 287 mmHg     Is BP treated: No     HDL Cholesterol: 31.5 mg/dL     Total Cholesterol: 138 mg/dL  -- ASA 81 mg discussed if CVD risk >10% age 50-59 and willing to take for 10 years -- Statin therapy for Age 11-75 with CVD risk >7.5%  Psych -- Depression screening (PHQ-9): Elevated and seeing psychiatry. Discussed and stressed the importance of therapy + medication and resources for therapy provided  Safety -- tobacco screening: not using -- alcohol screening:  low-risk usage. -- no evidence of domestic violence or intimate partner violence.  Cancer Screening -- Prostate (age 57-69) ordered -- Colon (age 24-75) overdue for follow-up, referral placed -- Lung not indicated   Immunizations Immunization History   Administered Date(s) Administered  . Tdap 11/11/2017    -- flu vaccine - declined -- TDAP q10 years up to date -- Shingles (age >59) declined -- PPSV-23 (19-64 with chronic disease or smoking) declined -- Covid-19 Vaccine declined  Encouraged regular vision and dental screening. Encouraged healthy exercise and diet.   Lesleigh Noe

## 2020-12-12 ENCOUNTER — Ambulatory Visit: Payer: 59 | Admitting: Family Medicine

## 2020-12-12 ENCOUNTER — Other Ambulatory Visit: Payer: Self-pay

## 2020-12-12 VITALS — BP 98/60 | HR 91 | Temp 97.4°F | Ht 69.0 in | Wt 193.2 lb

## 2020-12-12 DIAGNOSIS — F411 Generalized anxiety disorder: Secondary | ICD-10-CM

## 2020-12-12 DIAGNOSIS — F331 Major depressive disorder, recurrent, moderate: Secondary | ICD-10-CM | POA: Diagnosis not present

## 2020-12-12 DIAGNOSIS — K130 Diseases of lips: Secondary | ICD-10-CM | POA: Diagnosis not present

## 2020-12-12 DIAGNOSIS — B002 Herpesviral gingivostomatitis and pharyngotonsillitis: Secondary | ICD-10-CM

## 2020-12-12 MED ORDER — ESCITALOPRAM OXALATE 20 MG PO TABS: 20.0000 mg | ORAL_TABLET | Freq: Every day | ORAL | 1 refills | Status: DC

## 2020-12-12 MED ORDER — CLOTRIMAZOLE 1 % EX CREA: 1.0000 "application " | TOPICAL_CREAM | Freq: Two times a day (BID) | CUTANEOUS | 0 refills | Status: DC

## 2020-12-12 MED ORDER — VALACYCLOVIR HCL 1 G PO TABS: 1000.0000 mg | ORAL_TABLET | Freq: Two times a day (BID) | ORAL | 0 refills | Status: AC

## 2020-12-12 NOTE — Progress Notes (Signed)
Subjective:     Martin Downs is a 56 y.o. male presenting for Mouth Lesions (Bottom lip, also cracked corners of his mouth since November)     HPI  #Cold sore - on the left lower lip - previously had one on the right upper lip - is improving but still tingling sensation like it is going to start again  #side of lips - aquafor - abreva  - vaseline  - w temporary improvement   #Depression - taking lexapro - feels it could be better  #Diabetes - is taking medication - though often wonders if it is worth it  Review of Systems   Social History   Tobacco Use  Smoking Status Never Smoker  Smokeless Tobacco Former Neurosurgeon  . Types: Snuff        Objective:    BP Readings from Last 3 Encounters:  12/12/20 98/60  11/23/20 130/80  11/21/20 120/80   Wt Readings from Last 3 Encounters:  12/12/20 193 lb 4 oz (87.7 kg)  11/23/20 185 lb (83.9 kg)  11/21/20 193 lb 9.6 oz (87.8 kg)    BP 98/60   Pulse 91   Temp (!) 97.4 F (36.3 C)   Ht 5\' 9"  (1.753 m)   Wt 193 lb 4 oz (87.7 kg)   SpO2 98%   BMI 28.54 kg/m    Physical Exam Constitutional:      Appearance: Normal appearance. He is not ill-appearing or diaphoretic.  HENT:     Right Ear: External ear normal.     Left Ear: External ear normal.     Mouth/Throat:     Comments: Small cracks/fissures on the bilateral angle of the lip. Healing erythema on the left lower lip.  Eyes:     General: No scleral icterus.    Extraocular Movements: Extraocular movements intact.     Conjunctiva/sclera: Conjunctivae normal.  Cardiovascular:     Rate and Rhythm: Normal rate.  Pulmonary:     Effort: Pulmonary effort is normal.  Musculoskeletal:     Cervical back: Neck supple.  Skin:    General: Skin is warm and dry.     Comments: Right ear with healing biopsy sight with slight erythema  Neurological:     Mental Status: He is alert. Mental status is at baseline.  Psychiatric:        Mood and Affect: Mood normal.            Assessment & Plan:   Problem List Items Addressed This Visit      Digestive   Oral herpes simplex infection    Rare episodes, but possible recurrence at healing lesion with tingling today. Will treat with oral. He will reach out of he continues to have recurrences.       Relevant Medications   clotrimazole (LOTRIMIN) 1 % cream   valACYclovir (VALTREX) 1000 MG tablet   Angular cheilitis - Primary    Etiology - discussed DM and poorly fitting dentures can contribute. Given poorly controled DM and lack of improvement with vaseline discussed trial of clotrimazole for possible yeast infection. If no improvement will get dentures assessed and focus on diabetes control as well as zinc oxide at night.       Relevant Medications   clotrimazole (LOTRIMIN) 1 % cream     Other   Generalized anxiety disorder    Increase lexapro. Return 2 months      Relevant Medications   escitalopram (LEXAPRO) 20 MG tablet   Major  depressive disorder, recurrent (HCC)    Poorly controlled and concerned this is impacting diabetes control. Discussed increase in lexapro 10>20 mg and return in 2 months for check in.       Relevant Medications   escitalopram (LEXAPRO) 20 MG tablet       Return in about 2 months (around 02/11/2021) for depression f/u.  Lynnda Child, MD  This visit occurred during the SARS-CoV-2 public health emergency.  Safety protocols were in place, including screening questions prior to the visit, additional usage of staff PPE, and extensive cleaning of exam room while observing appropriate contact time as indicated for disinfecting solutions.

## 2020-12-12 NOTE — Assessment & Plan Note (Signed)
Increase lexapro. Return 2 months

## 2020-12-12 NOTE — Assessment & Plan Note (Signed)
Rare episodes, but possible recurrence at healing lesion with tingling today. Will treat with oral. He will reach out of he continues to have recurrences.

## 2020-12-12 NOTE — Patient Instructions (Addendum)
1) Cold sore - take valacyclovir 1000 mg every 12 hours for 1 day -- Call or mychart if you keep getting sores  2) Sores on side of mouth - may be due to diabetes or your dentures (consider getting them checked out) - Can try Clotrimazole antifungal sent to pharmacy - twice daily for 2-3 weeks - if no improvement, stop the antifungal and return to barrier can try Zinc oxide at night  3) Depression  - increase lexapro to 20 mg - return in 2 months for depression check in

## 2020-12-12 NOTE — Assessment & Plan Note (Signed)
Etiology - discussed DM and poorly fitting dentures can contribute. Given poorly controled DM and lack of improvement with vaseline discussed trial of clotrimazole for possible yeast infection. If no improvement will get dentures assessed and focus on diabetes control as well as zinc oxide at night.

## 2020-12-12 NOTE — Assessment & Plan Note (Signed)
Poorly controlled and concerned this is impacting diabetes control. Discussed increase in lexapro 10>20 mg and return in 2 months for check in.

## 2021-01-10 ENCOUNTER — Other Ambulatory Visit: Payer: Self-pay | Admitting: Internal Medicine

## 2021-01-10 DIAGNOSIS — IMO0002 Reserved for concepts with insufficient information to code with codable children: Secondary | ICD-10-CM

## 2021-01-10 DIAGNOSIS — Z0279 Encounter for issue of other medical certificate: Secondary | ICD-10-CM

## 2021-01-24 ENCOUNTER — Telehealth: Payer: Self-pay | Admitting: Internal Medicine

## 2021-01-24 NOTE — Telephone Encounter (Signed)
Patient states his insurance no longer covers [place] but they cover Lantus instead. Patient is out of [place] and asks if we can go ahead and call in Lantus for him to:  [id number], [place] - [address] Phone:  [phone]  Fax:  [phone]

## 2021-01-25 NOTE — Telephone Encounter (Signed)
Yes, we can sub Lantus, but I am not sure to which dose he titrated  - we can send it with 60-80 units at bedtime. He needs most likely 45 ml with 3 refills.

## 2021-01-26 MED ORDER — LANTUS SOLOSTAR 100 UNIT/ML ~~LOC~~ SOPN: PEN_INJECTOR | SUBCUTANEOUS | 3 refills | Status: DC

## 2021-01-26 NOTE — Addendum Note (Signed)
Addended by: Eliseo Squires on: 01/26/2021 04:37 PM   Modules accepted: Orders

## 2021-02-07 ENCOUNTER — Ambulatory Visit (INDEPENDENT_AMBULATORY_CARE_PROVIDER_SITE_OTHER): Payer: 59 | Admitting: Internal Medicine

## 2021-02-07 ENCOUNTER — Other Ambulatory Visit: Payer: Self-pay

## 2021-02-07 ENCOUNTER — Encounter: Payer: Self-pay | Admitting: Internal Medicine

## 2021-02-07 VITALS — BP 110/78 | HR 84 | Ht 69.0 in | Wt 197.4 lb

## 2021-02-07 DIAGNOSIS — IMO0002 Reserved for concepts with insufficient information to code with codable children: Secondary | ICD-10-CM

## 2021-02-07 DIAGNOSIS — E1165 Type 2 diabetes mellitus with hyperglycemia: Secondary | ICD-10-CM | POA: Diagnosis not present

## 2021-02-07 DIAGNOSIS — E230 Hypopituitarism: Secondary | ICD-10-CM

## 2021-02-07 DIAGNOSIS — R7989 Other specified abnormal findings of blood chemistry: Secondary | ICD-10-CM | POA: Diagnosis not present

## 2021-02-07 DIAGNOSIS — E1121 Type 2 diabetes mellitus with diabetic nephropathy: Secondary | ICD-10-CM | POA: Diagnosis not present

## 2021-02-07 LAB — POCT GLYCOSYLATED HEMOGLOBIN (HGB A1C): Hemoglobin A1C: 8.3 % — AB (ref 4.0–5.6)

## 2021-02-07 NOTE — Patient Instructions (Addendum)
Please continue: - Metformin 1000 mg 2x daily - Jardiance 25 mg in a.m. - Ozempic 1 mg weekly - Tresiba 60 units daily - Humalog U200 10-14 units 15 min before meals  Please continue: - Testosterone injection 50 mg every 2 weeks into the muscle (draw to the 0.25 ml mark).  Please stop at the lab.  Please return in 3 months with your sugar log.

## 2021-02-07 NOTE — Progress Notes (Signed)
Patient ID: Martin Downs, male   DOB: 1966/07/20, 55 y.o.   MRN: [id number]  This visit occurred during the SARS-CoV-2 public health emergency.  Safety protocols were in place, including screening questions prior to the visit, additional usage of staff PPE, and extensive cleaning of exam room while observing appropriate contact time as indicated for disinfecting solutions.   HPI: Martin Downs is a 55 y.o.-year-old male, returning for f/u for DM2 dx 2014, insulin-dependent since 02/2014, uncontrolled, with complications (mild CKD) and hypogonadotropic hypogonadism. Last visit 3 months ago.  Interim history: In the last 2 years, he started to see psychiatry for depression and anxiety.  He was also referred to a sleep specialist for his sleep apnea.  He cannot tolerate the CPAP.  He is now also on Lexapro, Depakote, Trazodone, Risperdal. He still sleeps only for 1-2h at a time. He was terminated due to prolonged FMLA for his elbow pain >> now changed to [employer].  DM2: Reviewed HbA1c levels: Lab Results  Component Value Date   HGBA1C 12.2 (A) 11/21/2020   HGBA1C 10.5 (A) 03/30/2020   HGBA1C 10.8 (H) 07/08/2019   He is on: -  >> Tresiba 60 >> 80 >> off >> 60 units daily  -  Humalog 14 units 3 times daily before meals  - Metformin 1000 mg 2x daily -Jardiance 25 mg in a.m - Ozempic 1 mg weekly We stopped Actoplus 15-850 mg in 02/2014. We stopped Glipizide when we increased Novolog. Insurance does not cover Invokana. We tried to add Actos 15 mg daily (added back 06/2016). We tried U500 insulin.  At last visit was not checking sugars at all.  They - am: 210-240 >> 243-275 >> 230s >> 57, 103-243 - 2h after b'fast: n/c >>  239, 247 >> n/c - lunch: 215 >> 230 >> 200s >> 200s >> 123-232 - 2h after lunch:  n/c >> 230-280 >> 200-240 >> n/c - dinner: 327-475 >> 200-240 >> 200s >> 200s - 2h after dinner: 220-280 >> 310-425 >> 200-240 >> n/c - bedtime:200-220 >> n/c >> 240-284  >> n/c  - nighttime: 130-218 >> n/c >> 239 >> n/c  Lowest: 57 Highest: 245  Meter: One Touch Ultra mini  Pt's meals are -he grazes throughout the day: - Breakfast: oatmeal or yoghurt or fruit bar/cup (largest meal) - Lunch: PB sandwich - Dinner: chicken leftovers - Snacks: sandwich, 1 pack of crackers, diet Mountain dew  -+ Mild CKD, last BUN/creatinine:  Lab Results  Component Value Date   BUN 11 11/23/2020   CREATININE 0.89 11/23/2020   Lab Results  Component Value Date   MICRALBCREAT 2.8 11/23/2020   MICRALBCREAT 14 07/08/2019   MICRALBCREAT 0.3 05/13/2013   MICRALBCREAT 0.6 01/17/2010   MICRALBCREAT 4.8 12/30/2008   MICRALBCREAT 7.5 09/05/2007  Not on ACE inhibitor.  -+ HL; last set of lipids: Lab Results  Component Value Date   CHOL 170 11/23/2020   HDL 35.60 (L) 11/23/2020   LDLCALC 56 07/08/2019   LDLDIRECT 102.0 11/23/2020   TRIG (H) 11/23/2020    479.0 Triglyceride is over 400; calculations on Lipids are invalid.   CHOLHDL 5 11/23/2020  On Crestor 10 mg daily, fenofibrate 160 mg daily.  - last eye exam was on 03/25/2020: No DR  -No numbness and tingling in his feet.  Hypogonadotropic hypogonadism: - He was on Androgel  in 2015 >> his testosterone levels did not increase and he did not feel better  >> he is now on intramuscular  testosterone.  At last visit he was on 50 mg weekly, but upon questioning, he is actually taking 100 mg weekly inadvertently, as he was not aware of the testosterone concentration is 200 mg/mL.  Reviewed his testosterone levels: His level from 06/2019 was high on the above dose so we decreased the dose to 50 mg weekly since then but he did not return for further labs. Upon questioning, he decreased the dose as advised but also moved the injections every other week.  Subsequently, the latest testosterone levels were very low in 11/2019.  Upon questioning, he was taking half of the recommended dose so I advised him to increase the dose to  once a week repeatedly at last visits, but he prefers to take it every 2 weeks.  Component     Latest Ref Rng & Units 12/22/2019  Testosterone, Serum (Total)     ng/dL 15 (L)  % Free Testosterone     % 1.1  Free Testosterone, S     pg/mL 1.7 (L)  Sex Hormone Binding Globulin     nmol/L 45.1   Component     Latest Ref Rng & Units 07/08/2019  Testosterone, Serum (Total)     ng/dL 1,451 (H)  % Free Testosterone     % 3.0  Free Testosterone, S     pg/mL 435 (H)  Sex Hormone Binding Globulin     nmol/L 22.4   Component     Latest Ref Rng & Units 04/30/2017 12/11/2017  Testosterone     264 - 916 ng/dL 1,108 (H) 485  Testosterone Free     7.2 - 24.0 pg/mL 28.6 (H) 14.6  Sex Horm Binding Glob, Serum     19.3 - 76.4 nmol/L 33.0 39.5   PSA levels were normal: Lab Results  Component Value Date   PSA 0.53 11/23/2020   PSA 0.69 03/01/2016   PSA 0.91 10/18/2015   PSA 0.37 01/17/2010   PSA 0.55 11/07/2009   His hemoglobin and hematocrit levels were normal: Lab Results  Component Value Date   WBC 4.2 11/23/2020   HGB 14.2 11/23/2020   HCT 42.3 11/23/2020   MCV 90.7 11/23/2020   PLT 195.0 11/23/2020   -We checked his pituitary function in 2016 and the labs were normal except for inappropriately normal LH and FSH and also low testosterone.  Prolactin was high but the monomeric prolactin was normal:  Component     Latest Ref Rng 05/16/2015  Testosterone     300 - 890 ng/dL 132 (L)  Sex Hormone Binding     10 - 50 nmol/L 28  Testosterone Free     47.0 - 244.0 pg/mL 26.9 (L)  Testosterone-% Free     1.6 - 2.9 % 2.0  IGF-I, LC/MS     50 - 317 ng/mL 99  Z-Score (Male)     -2.0-+2.0 SD -0.7  Prolactin, Total     2.0 - 18.0 ng/mL 35.4 (H)  Prolactin, Monomeric     3.4 - 14.8 ng/mL 11.4  LH     1.50 - 9.30 mIU/mL 2.03  FSH     1.4 - 18.1 mIU/ML 5.9  Cortisol, Plasma      14.2  C206 ACTH     6 - 50 pg/mL 32   Previous labs (drawn at 8:33 AM)  Prolactin 38.6  (2.1-17.1) FSH 5.6, LH 2.0 Total testosterone 134 (300-890), bioavailable testosterone 35.5 ng/dL (130.5-681.7), free testosterone 18 pg/mL (47-244), SHBG 29 (10-50) PSA 0.16 Estradiol 17.1 (  0-39)  Reviewed the report of his pituitary MRI 07/14/2015: No pituitary lesion  Hyperprolactinemia: -  found during investigation for low testosterone, by Dr. Junious Silk, his urologist. -This turned out to be related to macroprolactinoma which does not require further investigation or treatment  No gynecomastia, galactorrhea, breast tenderness.   Patient's alkaline phosphatase was low in 04/2017 (25) so he started a multivitamin.  He has a history of elevated TSH, but latest levels have been normal. Lab Results  Component Value Date   TSH 1.93 11/23/2020   TSH 1.670 07/08/2019   TSH 2.79 11/11/2017   TSH 1.90 03/01/2016   TSH 1.52 05/16/2015   TSH 1.56 02/11/2014   TSH 1.68 05/13/2013   TSH 1.84 03/03/2013   TSH 0.69 12/29/2010   TSH 0.58 04/18/2010   ROS: Constitutional: no weight gain/no weight loss, + fatigue, no subjective hyperthermia, no subjective hypothermia Eyes: no blurry vision, no xerophthalmia ENT: no sore throat, no nodules palpated in neck, no dysphagia, no odynophagia, no hoarseness Cardiovascular: no CP/no SOB/no palpitations/no leg swelling Respiratory: no cough/no SOB/no wheezing Gastrointestinal: no N/no V/no D/no C/no acid reflux Musculoskeletal: no muscle aches/+ joint aches (L elbow) Skin: no rashes, no hair loss Neurological: no tremors/no numbness/no tingling/no dizziness  I reviewed pt's medications, allergies, PMH, social hx, family hx, and changes were documented in the history of present illness. Otherwise, unchanged from my initial visit note.  Past Medical History:  Diagnosis Date   Anxiety    Borderline diabetes    Chronic pain    Depression    Diabetes mellitus without complication ([place])    Hyperlipidemia    Narcotic dependence ([place])    Sleep  apnea    no cpap   Past Surgical History:  Procedure Laterality Date   CHOLECYSTECTOMY N/A 05/30/2016   Procedure: LAPAROSCOPIC CHOLECYSTECTOMY WITH INTRAOPERATIVE CHOLANGIOGRAM;  Surgeon: Donnie Mesa, MD;  Location: [facility];  Service: General;  Laterality: N/A;   CHOLECYSTECTOMY N/A 05/30/2016   Procedure: OPEN COMMON BILE DUCT EXPLORATION; INSERTION OF T-TUBE;  Surgeon: Donnie Mesa, MD;  Location: [place];  Service: General;  Laterality: N/A;   removal of bone spur Bilateral 1995   elbps   TOOTH EXTRACTION  6761   UMBILICAL HERNIA REPAIR N/A 05/30/2016   Procedure: HERNIA REPAIR UMBILICAL ADULT;  Surgeon: Donnie Mesa, MD;  Location: [place];  Service: General;  Laterality: N/A;   Social History   Socioeconomic History   Marital status: Married    Spouse name: Amy   Number of children: 2   Years of education: Not on file   Highest education level: High school graduate  Occupational History   Occupation: Music therapist: UNEMPLOYED  Tobacco Use   Smoking status: Never   Smokeless tobacco: Former    Types: Snuff    Quit date: 03/30/2020  Vaping Use   Vaping Use: Never used  Substance and Sexual Activity   Alcohol use: No    Comment: quit 2009   Drug use: Not Currently   Sexual activity: Yes    Birth control/protection: Post-menopausal  Other Topics Concern   Not on file  Social History Narrative   11/23/19   From: the area   Living: with wife Amy, high school sweet hearts   Work: Music therapist, works Warehouse manager for a [employer]      Family: 2 children - Wellsite geologist and Sport and exercise psychologist - grown and out of the house      Enjoys: not sure, works too much,  watch TV      Exercise: walking at his job - bending/lifting   Diet: tries to do diabetic diet - but only partially, low appetite      Safety   Seat belts: Yes    Guns: declined   Safe in relationships: Yes    Social Determinants of Corporate investment banker Strain: Not on file  Food Insecurity: Not on file   Transportation Needs: Not on file  Physical Activity: Not on file  Stress: Not on file  Social Connections: Not on file  Intimate Partner Violence: Not on file   Current Outpatient Medications on File Prior to Visit  Medication Sig Dispense Refill   aspirin 81 MG tablet Take 81 mg by mouth daily.     BD DISP NEEDLES 18G X 1-1/2" MISC USE TO DRAW UP TESTOSTERONE 4 each 24   BD INSULIN SYRINGE U/F 31G X 5/16" 0.5 ML MISC USE 3 TIMES DAILY 100 each 0   BD PEN NEEDLE NANO 2ND GEN 32G X 4 MM MISC USE TO INJECT INSULIN 1 TIME DAILY AS INSTRUCTED. 100 each 3   Blood Glucose Monitoring Suppl (ONE TOUCH ULTRA MINI) W/DEVICE KIT Use as directed to test blood sugar twice daily 250.00 1 each 0   clotrimazole (LOTRIMIN) 1 % cream Apply 1 application topically 2 (two) times daily. 30 g 0   Continuous Blood Gluc Receiver (FREESTYLE LIBRE 2 READER) DEVI 1 each by Does not apply route daily. 1 each 0   Continuous Blood Gluc Sensor (FREESTYLE LIBRE 2 SENSOR) MISC 1 each by Does not apply route every 14 (fourteen) days. 6 each 3   divalproex (DEPAKOTE ER) 500 MG 24 hr tablet Take by mouth.     empagliflozin (JARDIANCE) 25 MG TABS tablet Take 1 tablet (25 mg total) by mouth daily. 90 tablet 3   escitalopram (LEXAPRO) 20 MG tablet Take 1 tablet (20 mg total) by mouth daily. 90 tablet 1   fenofibrate 160 MG tablet Take 1 tablet (160 mg total) by mouth daily. 90 tablet 1   fluticasone (FLONASE) 50 MCG/ACT nasal spray INSTILL 2 SPRAYS INTO EACH NOSTRIL EVERY DAY 16 mL 1   glucose blood (ONETOUCH VERIO) test strip Use to test blood sugar 4 times daily-Dx code E11.65 400 each 2   hydrOXYzine (VISTARIL) 25 MG capsule Take 25 mg by mouth 2 (two) times daily.     insulin glargine (LANTUS SOLOSTAR) 100 UNIT/ML Solostar Pen Inject 60-80 units at bedtime 45 mL 3   insulin lispro (HUMALOG KWIKPEN) 200 UNIT/ML KwikPen Inject 10-14 units before the 3 meals of the day 18 mL 3   Insulin Syringe-Needle U-100 (B-D INSULIN  SYRINGE 1CC/25GX1") 25G X 1" 1 ML MISC USE TO INJECT TESTOSTERONE WEEKLY 4 each 99   metFORMIN (GLUCOPHAGE) 1000 MG tablet Take 1 tablet 2x a day 180 tablet 3   methadone (DOLOPHINE) 10 MG/ML solution Take 180 mg by mouth daily.      Multiple Vitamin (MULTIVITAMIN) tablet Take 2 tablets by mouth 2 (two) times daily.     OneTouch Delica Lancets 33G MISC USE TO TEST BLOOD SUGAR 4 TIMES DAILY-DX code E11.65 200 each 3   risperiDONE (RISPERDAL) 0.5 MG tablet Take 0.5 mg by mouth 2 (two) times daily.     rosuvastatin (CRESTOR) 10 MG tablet Take 1qd 90 tablet 3   Semaglutide, 1 MG/DOSE, (OZEMPIC, 1 MG/DOSE,) 2 MG/1.5ML SOPN Inject 1 mg into the skin once a week. 4.5 mL 3  SYRINGE-NEEDLE, DISP, 3 ML (B-D 3CC LUER-LOK SYR 18GX1-1/2) 18G X 1-1/2" 3 ML MISC USE TO DRAW UP TESTOSTERONE 1 each 1   Syringe/Needle, Disp, 18G X 1" 1 ML MISC Use to draw up testosterone 100 each 5   testosterone cypionate (DEPOTESTOSTERONE CYPIONATE) 200 MG/ML injection INJECT 0.25MLS INTO THE MUSCLE ONCE WEEKLY 1 mL 1   traZODone (DESYREL) 50 MG tablet Take 50 mg by mouth at bedtime.     [DISCONTINUED] insulin aspart (NOVOLOG FLEXPEN) 100 UNIT/ML FlexPen Inject 10-15 Units into the skin 3 (three) times daily with meals. 30 mL 5   No current facility-administered medications on file prior to visit.   Allergies  Allergen Reactions   Gemfibrozil Diarrhea   Family History  Problem Relation Age of Onset   Hypertension Mother    Cancer Father        not sure of type   Diabetes Father    Hypertension Father    Heart attack Father 60   Diabetes Brother    Hypertension Brother    Heart attack Brother        around 26 years of age   Kidney cancer Brother        stage 4-1 kidney removed   Colon cancer Neg Hx     PE: BP 110/78 (BP Location: Right Arm, Patient Position: Sitting, Cuff Size: Normal)   Pulse 84   Ht $R'5\' 9"'WN$  (1.753 m)   Wt 197 lb 6.4 oz (89.5 kg)   SpO2 95%   BMI 29.15 kg/m  Body mass index is 29.15  kg/m.  Wt Readings from Last 3 Encounters:  02/07/21 197 lb 6.4 oz (89.5 kg)  12/12/20 193 lb 4 oz (87.7 kg)  11/23/20 185 lb (83.9 kg)   Constitutional: overweight, in NAD Eyes: PERRLA, EOMI, no exophthalmos ENT: moist mucous membranes, no thyromegaly, no cervical lymphadenopathy Cardiovascular: RRR, No MRG Respiratory: CTA B Gastrointestinal: abdomen soft, NT, ND, BS+ Musculoskeletal: no deformities, strength intact in all 4 Skin: moist, warm, no rashes Neurological: no tremor with outstretched hands, DTR normal in all 4  ASSESSMENT: 1. DM2, insulin-dependent, uncontrolled, with complications - mild ckd  - We discussed in the past about a VGO system or another type of pump >> but he works outside and sweats a lot >> he does not think he can wear this - We have discussed about a regular insulin pump in the past, but he cannot use any of the attached devices due to the fact that he has hyperhidrosis. - We did discuss about the possibility to refer him to another endocrinologist, possibly at [facility], for a second opinion, but he refuses.   2. Hypogonadotropic hypogonadism  3.  Hyperprolactinemia  4.  History of elevated TSH  PLAN:  1.  Complex patient with very difficult to control diabetes and high insulin resistance.  His sugars remain uncontrolled despite trying many different treatment strategies in the past.  He is constantly eating/snacking and never being in the fasting state, not even at night.  He was working excessively, approximately 20 hours a day and sleeping very little. -At last visit, he was off his injectable medicines (Ozempic, Tresiba, NovoLog) due to lack of perceived effect.  However, HbA1c was much higher at last visit, 12.2%, increased from 8.3% previously.  We restarted the above medications.  He still has [place] at home, but after he finishes, we need to switch from [place] to Lantus per insurance preference.  At last visit I also recommended a CGM  and sent a  prescription for the freestyle libre sensor to his pharmacy.  He is at high risk for diabetes complications and at last visit he appeared depressed.  He was working with a counselor/psychotherapist, however, he did not appear to be very invested in his diabetes care. -At today's visit, his sugars appear to be improved, however, the latest available glucose levels are from 2 months ago.  He did not check lately as he felt that they "go up and down" without any pattern.  He did have a low blood sugar at 57 in the morning x1 2 months ago, but he cannot remember what happened at that time.  For now, I strongly advised him to restart checking blood sugars and I gave him a pocket blood sugar log.  He was not able to obtain a CGM if this was too expensive. - I suggested to:  Patient Instructions  Please continue: - Metformin 1000 mg 2x daily - Jardiance 25 mg in a.m. - Ozempic 1 mg weekly - Tresiba 60 units daily - Humalog U200 10-14 units 15 min before meals  Please continue: - Testosterone injection 50 mg every 2 weeks into the muscle (draw to the 0.25 ml mark).  Please stop at the lab.  Please return in 3 months with your sugar log.   - we checked his HbA1c: 8.3% (better) - advised to check sugars at different times of the day - 4x a day, rotating check times - advised for yearly eye exams >> he is UTD - return to clinic in 3 months  2. Hypogonadotropic Hypogonadism -No pituitary tumor per review of his pituitary MRI -He was on IM injections 50 mg weekly.  On this dose, his testosterone level was normal in 11/2017, however, after this, a testosterone level was elevated.  Upon questioning, he was trying the testosterone in the syringe to the 0.5 mL mark, but he does have a 200 mg/mL formulation of testosterone so his dose was actually 100 mg/week.  At that time, I advised him to reduce the dose to 0.25 mL (50 mg) weekly. -At last check, his testosterone level was very low and, upon questioning,  he was taking the recommended dose every 2 weeks, rather than every week.  I advised him to keep the same dose but take it every week but he forgot.  At last visit, he was still taking it every 2 weeks.  I advised him again to take the dose weekly but he continues to prefer to take it every 2 weeks. -No side effects from testosterone replacement.  He still has low libido but no breast tension, headaches. -His most recent CBC and PSA were normal in 10/2020 -He has annual DRE's with his urologist.  He does have prostate hypertrophy -At today's visit, we will check a testosterone level  3.  Hyperprolactinemia  -This was due to lack of prolactin in the past-no further investigation needed  4. H/o Elevated TSH -TSH was normal at last check: Lab Results  Component Value Date   TSH 1.93 11/23/2020  -No thyrotoxic signs or symptoms  He needs all labs to go through [facility].  Component     Latest Ref Rng & Units 02/07/2021  Testosterone, Serum (Total)     ng/dL 612  % Free Testosterone     % 1.8  Free Testosterone, S     pg/mL 110  Sex Hormone Binding Globulin     nmol/L 22.3  Normal testosterone levels.  Philemon Kingdom, MD  PhD [facility] Endocrinology

## 2021-02-13 ENCOUNTER — Ambulatory Visit: Payer: 59 | Admitting: Family Medicine

## 2021-02-13 LAB — TESTOSTERONE, FREE AND TOTAL (INCLUDES SHBG)-(MALES)
% Free Testosterone: 1.8 %
Free Testosterone, S: 110 pg/mL
Sex Hormone Binding Globulin: 22.3 nmol/L
Testosterone, Serum (Total): 612 ng/dL

## 2021-03-13 ENCOUNTER — Other Ambulatory Visit: Payer: Self-pay | Admitting: Family Medicine

## 2021-03-13 DIAGNOSIS — E7800 Pure hypercholesterolemia, unspecified: Secondary | ICD-10-CM

## 2021-03-18 ENCOUNTER — Other Ambulatory Visit: Payer: Self-pay | Admitting: Internal Medicine

## 2021-04-05 LAB — HM DIABETES EYE EXAM

## 2021-05-16 ENCOUNTER — Encounter: Payer: Self-pay | Admitting: Family Medicine

## 2021-05-18 ENCOUNTER — Ambulatory Visit: Payer: 59 | Admitting: Internal Medicine

## 2021-06-01 ENCOUNTER — Telehealth: Payer: Self-pay | Admitting: Internal Medicine

## 2021-06-01 NOTE — Telephone Encounter (Signed)
MEDICATION: Tersiba  PHARMACY:  [facility] [address], [place]  HAS THE PATIENT CONTACTED THEIR PHARMACY?  Yes - was asked by pharmacy to call us  IS THIS A 90 DAY SUPPLY : yes  IS PATIENT OUT OF MEDICATION: yes  IF NOT; HOW MUCH IS LEFT:   LAST APPOINTMENT DATE: @8 /20/2022  NEXT APPOINTMENT DATE:@12 /08/2020  DO WE HAVE YOUR PERMISSION TO LEAVE A DETAILED MESSAGE?: yes  OTHER COMMENTS:    **Let patient know to contact pharmacy at the end of the day to make sure medication is ready. **  ** Please notify patient to allow 48-72 hours to process**  **Encourage patient to contact the pharmacy for refills or they can request refills through [facility]**

## 2021-06-02 NOTE — Telephone Encounter (Signed)
Pt calling in since [place] isn't covered by ins can he get a prescription of Lantus  [facility] [address], [place]  Pt contact [phone]

## 2021-06-02 NOTE — Telephone Encounter (Signed)
Contacted pharmacy previous rx never filled and still on file. Rx refilled for pt.

## 2021-06-02 NOTE — Telephone Encounter (Signed)
Called and left a detailed message advising pt Martin Downs is not on current med list. Last communication regarding medication insurance dose not cover it. Just want to confirm he is in fact requesting Tresiba and not Lantus.

## 2021-06-30 ENCOUNTER — Ambulatory Visit: Payer: 59 | Admitting: Internal Medicine

## 2021-07-08 ENCOUNTER — Encounter (HOSPITAL_COMMUNITY): Payer: Self-pay | Admitting: Emergency Medicine

## 2021-07-08 ENCOUNTER — Inpatient Hospital Stay (HOSPITAL_COMMUNITY)
Admission: EM | Admit: 2021-07-08 | Discharge: 2021-07-12 | DRG: 638 | Disposition: A | Discharge status: Home / Self Care | Payer: 59 | Attending: Internal Medicine | Admitting: Internal Medicine

## 2021-07-08 ENCOUNTER — Other Ambulatory Visit: Payer: Self-pay

## 2021-07-08 DIAGNOSIS — E871 Hypo-osmolality and hyponatremia: Secondary | ICD-10-CM | POA: Diagnosis present

## 2021-07-08 DIAGNOSIS — E1110 Type 2 diabetes mellitus with ketoacidosis without coma: Principal | ICD-10-CM | POA: Diagnosis present

## 2021-07-08 DIAGNOSIS — G8929 Other chronic pain: Secondary | ICD-10-CM | POA: Diagnosis present

## 2021-07-08 DIAGNOSIS — T383X6A Underdosing of insulin and oral hypoglycemic [antidiabetic] drugs, initial encounter: Secondary | ICD-10-CM | POA: Diagnosis present

## 2021-07-08 DIAGNOSIS — Z794 Long term (current) use of insulin: Secondary | ICD-10-CM

## 2021-07-08 DIAGNOSIS — F1120 Opioid dependence, uncomplicated: Secondary | ICD-10-CM | POA: Diagnosis present

## 2021-07-08 DIAGNOSIS — E860 Dehydration: Secondary | ICD-10-CM | POA: Diagnosis present

## 2021-07-08 DIAGNOSIS — N179 Acute kidney failure, unspecified: Secondary | ICD-10-CM | POA: Diagnosis present

## 2021-07-08 DIAGNOSIS — I480 Paroxysmal atrial fibrillation: Secondary | ICD-10-CM | POA: Diagnosis present

## 2021-07-08 DIAGNOSIS — Z833 Family history of diabetes mellitus: Secondary | ICD-10-CM

## 2021-07-08 DIAGNOSIS — R1010 Upper abdominal pain, unspecified: Secondary | ICD-10-CM

## 2021-07-08 DIAGNOSIS — Z7982 Long term (current) use of aspirin: Secondary | ICD-10-CM

## 2021-07-08 DIAGNOSIS — F32A Depression, unspecified: Secondary | ICD-10-CM | POA: Diagnosis present

## 2021-07-08 DIAGNOSIS — K449 Diaphragmatic hernia without obstruction or gangrene: Secondary | ICD-10-CM | POA: Diagnosis present

## 2021-07-08 DIAGNOSIS — K269 Duodenal ulcer, unspecified as acute or chronic, without hemorrhage or perforation: Secondary | ICD-10-CM | POA: Diagnosis present

## 2021-07-08 DIAGNOSIS — Z20822 Contact with and (suspected) exposure to covid-19: Secondary | ICD-10-CM | POA: Diagnosis present

## 2021-07-08 DIAGNOSIS — K209 Esophagitis, unspecified without bleeding: Secondary | ICD-10-CM

## 2021-07-08 DIAGNOSIS — R9389 Abnormal findings on diagnostic imaging of other specified body structures: Secondary | ICD-10-CM

## 2021-07-08 DIAGNOSIS — G4730 Sleep apnea, unspecified: Secondary | ICD-10-CM | POA: Diagnosis present

## 2021-07-08 DIAGNOSIS — K253 Acute gastric ulcer without hemorrhage or perforation: Secondary | ICD-10-CM

## 2021-07-08 DIAGNOSIS — Z79891 Long term (current) use of opiate analgesic: Secondary | ICD-10-CM

## 2021-07-08 DIAGNOSIS — Z7984 Long term (current) use of oral hypoglycemic drugs: Secondary | ICD-10-CM

## 2021-07-08 DIAGNOSIS — K2980 Duodenitis without bleeding: Secondary | ICD-10-CM | POA: Diagnosis present

## 2021-07-08 DIAGNOSIS — K259 Gastric ulcer, unspecified as acute or chronic, without hemorrhage or perforation: Secondary | ICD-10-CM | POA: Diagnosis present

## 2021-07-08 DIAGNOSIS — E1310 Other specified diabetes mellitus with ketoacidosis without coma: Secondary | ICD-10-CM

## 2021-07-08 DIAGNOSIS — K2970 Gastritis, unspecified, without bleeding: Secondary | ICD-10-CM | POA: Diagnosis present

## 2021-07-08 DIAGNOSIS — I4891 Unspecified atrial fibrillation: Secondary | ICD-10-CM

## 2021-07-08 DIAGNOSIS — D72829 Elevated white blood cell count, unspecified: Secondary | ICD-10-CM | POA: Diagnosis present

## 2021-07-08 DIAGNOSIS — E7800 Pure hypercholesterolemia, unspecified: Secondary | ICD-10-CM | POA: Diagnosis present

## 2021-07-08 DIAGNOSIS — Z9114 Patient's other noncompliance with medication regimen: Secondary | ICD-10-CM

## 2021-07-08 DIAGNOSIS — Z87891 Personal history of nicotine dependence: Secondary | ICD-10-CM

## 2021-07-08 DIAGNOSIS — K2210 Ulcer of esophagus without bleeding: Secondary | ICD-10-CM | POA: Diagnosis present

## 2021-07-08 DIAGNOSIS — Z79899 Other long term (current) drug therapy: Secondary | ICD-10-CM

## 2021-07-08 DIAGNOSIS — Z888 Allergy status to other drugs, medicaments and biological substances status: Secondary | ICD-10-CM

## 2021-07-08 LAB — CBG MONITORING, ED: Glucose-Capillary: 480 mg/dL — ABNORMAL HIGH (ref 70–99)

## 2021-07-08 NOTE — ED Provider Notes (Signed)
Emergency Medicine Provider Triage Evaluation Note  Martin Downs , a 55 y.o. male  was evaluated in triage.  Pt complains of central abdominal pain.  He reports 1 episode of vomiting.  He is nausea.  1-2 episodes of diarrhea.  He reports body aches, headache, generally feeling unwell.  He denies any syncope.  He hasn't taken any of his meds in three days (including his insulin) because he feels poorly.   Review of Systems  Positive: Abdominal pain, pale, vomiting, weak Negative: Syncope  Physical Exam  BP 108/88 (BP Location: Left Arm)   Pulse 82   Temp 98.4 F (36.9 C) (Oral)   Resp 20   SpO2 100%  Gen:   Awake, appears pale, unwell Resp:  Normal effort  MSK:   Moves extremities without difficulty  Other:  On monitor patient's heart rate is reading in the 60s to 80s. Will auscultating for heart sounds he appeared to be significantly tachycardic even though that was not picking up on the monitor.  Manually timed heart rate with auscultation and suspect it was between 1170-180   Medical Decision Making  Medically screening exam initiated at 11:29 PM.  Appropriate orders placed.  Martin Downs was informed that the remainder of the evaluation will be completed by another provider, this initial triage assessment does not replace that evaluation, and the importance of remaining in the ED until their evaluation is complete.  Patient is a 55 year old man who presents today for evaluation of feeling unwell. On the vital signs monitor his heart rate was in the 70s, however on auscultation he did significantly tachycardic. EKG was obtained confirming elevated heart rate in the 170s. In addition to being pale and generally unwell appearing had difficulty palpating radial pulses.  He was awake and able to respond. I called charge nurse, patient will be taken to trauma B.  2350: I spoke with Dr. Eudelia Downs as patient is coming to his pod, he is aware of patient.    Martin Gong,  PA-C 07/08/21 2351    Nira Conn, MD 07/09/21 [phone]

## 2021-07-08 NOTE — ED Triage Notes (Signed)
Pt from home with complaints of n/v/d with abdominal pain for "a few days"  pt is type 2 diabetic.  Does not check sugars at home and is non compliant with medication

## 2021-07-09 ENCOUNTER — Encounter (HOSPITAL_COMMUNITY): Payer: Self-pay | Admitting: Internal Medicine

## 2021-07-09 ENCOUNTER — Emergency Department (HOSPITAL_COMMUNITY): Payer: 59

## 2021-07-09 ENCOUNTER — Observation Stay (HOSPITAL_COMMUNITY): Payer: 59

## 2021-07-09 DIAGNOSIS — Z79899 Other long term (current) drug therapy: Secondary | ICD-10-CM | POA: Diagnosis not present

## 2021-07-09 DIAGNOSIS — N179 Acute kidney failure, unspecified: Secondary | ICD-10-CM | POA: Diagnosis present

## 2021-07-09 DIAGNOSIS — F1120 Opioid dependence, uncomplicated: Secondary | ICD-10-CM

## 2021-07-09 DIAGNOSIS — R9389 Abnormal findings on diagnostic imaging of other specified body structures: Secondary | ICD-10-CM | POA: Diagnosis not present

## 2021-07-09 DIAGNOSIS — E871 Hypo-osmolality and hyponatremia: Secondary | ICD-10-CM | POA: Diagnosis present

## 2021-07-09 DIAGNOSIS — Z9114 Patient's other noncompliance with medication regimen: Secondary | ICD-10-CM

## 2021-07-09 DIAGNOSIS — K253 Acute gastric ulcer without hemorrhage or perforation: Secondary | ICD-10-CM | POA: Diagnosis not present

## 2021-07-09 DIAGNOSIS — G8929 Other chronic pain: Secondary | ICD-10-CM | POA: Diagnosis present

## 2021-07-09 DIAGNOSIS — E7800 Pure hypercholesterolemia, unspecified: Secondary | ICD-10-CM | POA: Diagnosis present

## 2021-07-09 DIAGNOSIS — K449 Diaphragmatic hernia without obstruction or gangrene: Secondary | ICD-10-CM | POA: Diagnosis present

## 2021-07-09 DIAGNOSIS — G4730 Sleep apnea, unspecified: Secondary | ICD-10-CM | POA: Diagnosis present

## 2021-07-09 DIAGNOSIS — Z833 Family history of diabetes mellitus: Secondary | ICD-10-CM | POA: Diagnosis not present

## 2021-07-09 DIAGNOSIS — Z79891 Long term (current) use of opiate analgesic: Secondary | ICD-10-CM | POA: Diagnosis not present

## 2021-07-09 DIAGNOSIS — E1110 Type 2 diabetes mellitus with ketoacidosis without coma: Secondary | ICD-10-CM | POA: Diagnosis present

## 2021-07-09 DIAGNOSIS — Z7982 Long term (current) use of aspirin: Secondary | ICD-10-CM | POA: Diagnosis not present

## 2021-07-09 DIAGNOSIS — K269 Duodenal ulcer, unspecified as acute or chronic, without hemorrhage or perforation: Secondary | ICD-10-CM | POA: Diagnosis present

## 2021-07-09 DIAGNOSIS — R1011 Right upper quadrant pain: Secondary | ICD-10-CM | POA: Diagnosis not present

## 2021-07-09 DIAGNOSIS — Z888 Allergy status to other drugs, medicaments and biological substances status: Secondary | ICD-10-CM | POA: Diagnosis not present

## 2021-07-09 DIAGNOSIS — R112 Nausea with vomiting, unspecified: Secondary | ICD-10-CM | POA: Diagnosis not present

## 2021-07-09 DIAGNOSIS — Z794 Long term (current) use of insulin: Secondary | ICD-10-CM | POA: Diagnosis not present

## 2021-07-09 DIAGNOSIS — K259 Gastric ulcer, unspecified as acute or chronic, without hemorrhage or perforation: Secondary | ICD-10-CM | POA: Diagnosis present

## 2021-07-09 DIAGNOSIS — I480 Paroxysmal atrial fibrillation: Secondary | ICD-10-CM | POA: Diagnosis present

## 2021-07-09 DIAGNOSIS — F32A Depression, unspecified: Secondary | ICD-10-CM | POA: Diagnosis present

## 2021-07-09 DIAGNOSIS — K209 Esophagitis, unspecified without bleeding: Secondary | ICD-10-CM | POA: Diagnosis not present

## 2021-07-09 DIAGNOSIS — K2970 Gastritis, unspecified, without bleeding: Secondary | ICD-10-CM | POA: Diagnosis present

## 2021-07-09 DIAGNOSIS — E860 Dehydration: Secondary | ICD-10-CM | POA: Diagnosis present

## 2021-07-09 DIAGNOSIS — R933 Abnormal findings on diagnostic imaging of other parts of digestive tract: Secondary | ICD-10-CM

## 2021-07-09 DIAGNOSIS — Z20822 Contact with and (suspected) exposure to covid-19: Secondary | ICD-10-CM | POA: Diagnosis present

## 2021-07-09 DIAGNOSIS — I4891 Unspecified atrial fibrillation: Secondary | ICD-10-CM

## 2021-07-09 DIAGNOSIS — Z7984 Long term (current) use of oral hypoglycemic drugs: Secondary | ICD-10-CM | POA: Diagnosis not present

## 2021-07-09 DIAGNOSIS — Z87891 Personal history of nicotine dependence: Secondary | ICD-10-CM | POA: Diagnosis not present

## 2021-07-09 DIAGNOSIS — K2210 Ulcer of esophagus without bleeding: Secondary | ICD-10-CM | POA: Diagnosis present

## 2021-07-09 LAB — CBC WITH DIFFERENTIAL/PLATELET
Abs Immature Granulocytes: 0.14 10*3/uL — ABNORMAL HIGH (ref 0.00–0.07)
Basophils Absolute: 0 10*3/uL (ref 0.0–0.1)
Basophils Relative: 0 %
Eosinophils Absolute: 0 10*3/uL (ref 0.0–0.5)
Eosinophils Relative: 0 %
HCT: 52 % (ref 39.0–52.0)
Hemoglobin: 18.2 g/dL — ABNORMAL HIGH (ref 13.0–17.0)
Immature Granulocytes: 1 %
Lymphocytes Relative: 5 %
Lymphs Abs: 0.9 10*3/uL (ref 0.7–4.0)
MCH: 29.7 pg (ref 26.0–34.0)
MCHC: 35 g/dL (ref 30.0–36.0)
MCV: 85 fL (ref 80.0–100.0)
Monocytes Absolute: 1.5 10*3/uL — ABNORMAL HIGH (ref 0.1–1.0)
Monocytes Relative: 9 %
Neutro Abs: 15.1 10*3/uL — ABNORMAL HIGH (ref 1.7–7.7)
Neutrophils Relative %: 85 %
Platelets: 403 10*3/uL — ABNORMAL HIGH (ref 150–400)
RBC: 6.12 MIL/uL — ABNORMAL HIGH (ref 4.22–5.81)
RDW: 12.8 % (ref 11.5–15.5)
WBC: 17.6 10*3/uL — ABNORMAL HIGH (ref 4.0–10.5)
nRBC: 0 % (ref 0.0–0.2)

## 2021-07-09 LAB — CBG MONITORING, ED
Glucose-Capillary: 406 mg/dL — ABNORMAL HIGH (ref 70–99)
Glucose-Capillary: 319 mg/dL — ABNORMAL HIGH (ref 70–99)
Glucose-Capillary: 270 mg/dL — ABNORMAL HIGH (ref 70–99)
Glucose-Capillary: 165 mg/dL — ABNORMAL HIGH (ref 70–99)
Glucose-Capillary: 158 mg/dL — ABNORMAL HIGH (ref 70–99)
Glucose-Capillary: 143 mg/dL — ABNORMAL HIGH (ref 70–99)
Glucose-Capillary: 113 mg/dL — ABNORMAL HIGH (ref 70–99)
Glucose-Capillary: 197 mg/dL — ABNORMAL HIGH (ref 70–99)
Glucose-Capillary: 198 mg/dL — ABNORMAL HIGH (ref 70–99)
Glucose-Capillary: 189 mg/dL — ABNORMAL HIGH (ref 70–99)
Glucose-Capillary: 216 mg/dL — ABNORMAL HIGH (ref 70–99)

## 2021-07-09 LAB — MAGNESIUM: Magnesium: 2.6 mg/dL — ABNORMAL HIGH (ref 1.7–2.4)

## 2021-07-09 LAB — RESP PANEL BY RT-PCR (FLU A&B, COVID) ARPGX2
Influenza A by PCR: NEGATIVE
Influenza B by PCR: NEGATIVE
SARS Coronavirus 2 by RT PCR: NEGATIVE

## 2021-07-09 LAB — I-STAT VENOUS BLOOD GAS, ED
Acid-base deficit: 15 mmol/L — ABNORMAL HIGH (ref 0.0–2.0)
Bicarbonate: 12.3 mmol/L — ABNORMAL LOW (ref 20.0–28.0)
Calcium, Ion: 1.11 mmol/L — ABNORMAL LOW (ref 1.15–1.40)
HCT: 54 % — ABNORMAL HIGH (ref 39.0–52.0)
Hemoglobin: 18.4 g/dL — ABNORMAL HIGH (ref 13.0–17.0)
O2 Saturation: 59 %
Potassium: 5.3 mmol/L — ABNORMAL HIGH (ref 3.5–5.1)
Sodium: 122 mmol/L — ABNORMAL LOW (ref 135–145)
TCO2: 13 mmol/L — ABNORMAL LOW (ref 22–32)
pCO2, Ven: 34 mmHg — ABNORMAL LOW (ref 44.0–60.0)
pH, Ven: 7.165 — CL (ref 7.250–7.430)
pO2, Ven: 39 mmHg (ref 32.0–45.0)

## 2021-07-09 LAB — URINALYSIS, ROUTINE W REFLEX MICROSCOPIC
Bacteria, UA: NONE SEEN
Bilirubin Urine: NEGATIVE
Glucose, UA: >=500 mg/dL — AB
Hgb urine dipstick: NEGATIVE
Ketones, ur: 80 mg/dL — AB
Leukocytes,Ua: NEGATIVE
Nitrite: NEGATIVE
Protein, ur: NEGATIVE mg/dL
Specific Gravity, Urine: 1.026 (ref 1.005–1.030)
pH: 6 (ref 5.0–8.0)

## 2021-07-09 LAB — BETA-HYDROXYBUTYRIC ACID
Beta-Hydroxybutyric Acid: 6.64 mmol/L — ABNORMAL HIGH (ref 0.05–0.27)
Beta-Hydroxybutyric Acid: 3.28 mmol/L — ABNORMAL HIGH (ref 0.05–0.27)

## 2021-07-09 LAB — BASIC METABOLIC PANEL
Anion gap: 14 (ref 5–15)
BUN: 28 mg/dL — ABNORMAL HIGH (ref 6–20)
CO2: 12 mmol/L — ABNORMAL LOW (ref 22–32)
Calcium: 7.4 mg/dL — ABNORMAL LOW (ref 8.9–10.3)
Chloride: 102 mmol/L (ref 98–111)
Creatinine, Ser: 1.06 mg/dL (ref 0.61–1.24)
GFR, Estimated: >60 mL/min (ref >60)
Glucose, Bld: 162 mg/dL — ABNORMAL HIGH (ref 70–99)
Potassium: 4 mmol/L (ref 3.5–5.1)
Sodium: 128 mmol/L — ABNORMAL LOW (ref 135–145)

## 2021-07-09 LAB — HEMOGLOBIN A1C
Hgb A1c MFr Bld: 11.5 % — ABNORMAL HIGH (ref 4.8–5.6)
Mean Plasma Glucose: 283.35 mg/dL

## 2021-07-09 LAB — RAPID URINE DRUG SCREEN, HOSP PERFORMED
Amphetamines: NOT DETECTED
Barbiturates: NOT DETECTED
Benzodiazepines: NOT DETECTED
Cocaine: NOT DETECTED
Opiates: NOT DETECTED
Tetrahydrocannabinol: NOT DETECTED

## 2021-07-09 LAB — LIPASE, BLOOD: Lipase: 62 U/L — ABNORMAL HIGH (ref 11–51)

## 2021-07-09 LAB — LACTIC ACID, PLASMA
Lactic Acid, Venous: 1.1 mmol/L (ref 0.5–1.9)
Lactic Acid, Venous: 1.8 mmol/L (ref 0.5–1.9)

## 2021-07-09 LAB — HIV ANTIBODY (ROUTINE TESTING W REFLEX): HIV Screen 4th Generation wRfx: NONREACTIVE

## 2021-07-09 MED ORDER — DEXTROSE IN LACTATED RINGERS 5 % IV SOLN: INTRAVENOUS | Status: DC

## 2021-07-09 MED ORDER — ONDANSETRON HCL 4 MG PO TABS: 4.0000 mg | ORAL_TABLET | Freq: Four times a day (QID) | ORAL | Status: DC | PRN

## 2021-07-09 MED ORDER — MORPHINE SULFATE (PF) 4 MG/ML IV SOLN
8.0000 mg | Freq: Once | INTRAVENOUS | Status: AC
  Administered 2021-07-09: 8 mg via INTRAVENOUS
  Filled 2021-07-09: qty 2

## 2021-07-09 MED ORDER — SODIUM CHLORIDE 0.9 % IV SOLN
1000.0000 mL | INTRAVENOUS | Status: DC
  Administered 2021-07-09: 1000 mL via INTRAVENOUS

## 2021-07-09 MED ORDER — RISPERIDONE 0.5 MG PO TABS
0.5000 mg | ORAL_TABLET | Freq: Two times a day (BID) | ORAL | Status: DC
  Administered 2021-07-09 – 2021-07-11 (×4): 0.5 mg via ORAL
  Filled 2021-07-09 (×6): qty 1

## 2021-07-09 MED ORDER — LACTATED RINGERS IV SOLN: INTRAVENOUS | Status: DC

## 2021-07-09 MED ORDER — ROSUVASTATIN CALCIUM 5 MG PO TABS
10.0000 mg | ORAL_TABLET | Freq: Every day | ORAL | Status: DC
  Administered 2021-07-09 – 2021-07-12 (×4): 10 mg via ORAL
  Filled 2021-07-09 (×4): qty 2

## 2021-07-09 MED ORDER — INSULIN GLARGINE-YFGN 100 UNIT/ML ~~LOC~~ SOLN
35.0000 [IU] | Freq: Two times a day (BID) | SUBCUTANEOUS | Status: DC
Start: 2021-07-09 — End: 2021-07-10
  Administered 2021-07-09: 35 [IU] via SUBCUTANEOUS
  Filled 2021-07-09 (×4): qty 0.35

## 2021-07-09 MED ORDER — INSULIN REGULAR(HUMAN) IN NACL 100-0.9 UT/100ML-% IV SOLN
INTRAVENOUS | Status: DC
  Administered 2021-07-09: 19 [IU]/h via INTRAVENOUS
  Filled 2021-07-09: qty 100

## 2021-07-09 MED ORDER — ACETAMINOPHEN 650 MG RE SUPP: 650.0000 mg | Freq: Four times a day (QID) | RECTAL | Status: DC | PRN

## 2021-07-09 MED ORDER — IOPAMIDOL (ISOVUE-370) INJECTION 76%
50.0000 mL | Freq: Once | INTRAVENOUS | Status: AC | PRN
  Administered 2021-07-09: 65 mL via INTRAVENOUS

## 2021-07-09 MED ORDER — DEXTROSE 50 % IV SOLN: 0.0000 mL | INTRAVENOUS | Status: DC | PRN

## 2021-07-09 MED ORDER — FENOFIBRATE 160 MG PO TABS
160.0000 mg | ORAL_TABLET | Freq: Every day | ORAL | Status: DC
  Administered 2021-07-09 – 2021-07-12 (×4): 160 mg via ORAL
  Filled 2021-07-09 (×4): qty 1

## 2021-07-09 MED ORDER — ENOXAPARIN SODIUM 40 MG/0.4ML IJ SOSY
40.0000 mg | PREFILLED_SYRINGE | INTRAMUSCULAR | Status: DC
  Administered 2021-07-09 – 2021-07-11 (×3): 40 mg via SUBCUTANEOUS
  Filled 2021-07-09 (×3): qty 0.4

## 2021-07-09 MED ORDER — DIVALPROEX SODIUM ER 500 MG PO TB24
500.0000 mg | ORAL_TABLET | Freq: Every day | ORAL | Status: DC
  Administered 2021-07-09 – 2021-07-12 (×4): 500 mg via ORAL
  Filled 2021-07-09 (×4): qty 1

## 2021-07-09 MED ORDER — INSULIN ASPART 100 UNIT/ML IJ SOLN
0.0000 [IU] | Freq: Three times a day (TID) | INTRAMUSCULAR | Status: DC
  Administered 2021-07-09 (×2): 2 [IU] via SUBCUTANEOUS
  Administered 2021-07-10: 1 [IU] via SUBCUTANEOUS
  Administered 2021-07-10 – 2021-07-11 (×2): 2 [IU] via SUBCUTANEOUS
  Administered 2021-07-11: 3 [IU] via SUBCUTANEOUS
  Administered 2021-07-11: 2 [IU] via SUBCUTANEOUS
  Administered 2021-07-12: 1 [IU] via SUBCUTANEOUS

## 2021-07-09 MED ORDER — METHADONE HCL 10 MG PO TABS
90.0000 mg | ORAL_TABLET | Freq: Every day | ORAL | Status: DC
  Administered 2021-07-09 – 2021-07-12 (×4): 90 mg via ORAL
  Filled 2021-07-09 (×4): qty 9

## 2021-07-09 MED ORDER — DILTIAZEM LOAD VIA INFUSION
10.0000 mg | Freq: Once | INTRAVENOUS | Status: AC
  Administered 2021-07-09: 10 mg via INTRAVENOUS
  Filled 2021-07-09: qty 10

## 2021-07-09 MED ORDER — ACETAMINOPHEN 325 MG PO TABS: 650.0000 mg | ORAL_TABLET | Freq: Four times a day (QID) | ORAL | Status: DC | PRN

## 2021-07-09 MED ORDER — DILTIAZEM HCL 30 MG PO TABS
30.0000 mg | ORAL_TABLET | Freq: Four times a day (QID) | ORAL | Status: DC
  Administered 2021-07-09 (×2): 30 mg via ORAL
  Filled 2021-07-09 (×3): qty 1

## 2021-07-09 MED ORDER — FENTANYL CITRATE (PF) 100 MCG/2ML IJ SOLN
INTRAMUSCULAR | Status: AC
  Filled 2021-07-09: qty 2

## 2021-07-09 MED ORDER — SODIUM CHLORIDE 0.9 % IV BOLUS
1000.0000 mL | Freq: Once | INTRAVENOUS | Status: AC
  Administered 2021-07-09: 1000 mL via INTRAVENOUS

## 2021-07-09 MED ORDER — DILTIAZEM HCL 25 MG/5ML IV SOLN
10.0000 mg | Freq: Four times a day (QID) | INTRAVENOUS | Status: DC | PRN
  Administered 2021-07-09: 10 mg via INTRAVENOUS
  Filled 2021-07-09 (×2): qty 5

## 2021-07-09 MED ORDER — PANTOPRAZOLE SODIUM 40 MG IV SOLR: 40.0000 mg | Freq: Two times a day (BID) | INTRAVENOUS | Status: DC

## 2021-07-09 MED ORDER — SODIUM CHLORIDE 0.9 % IV BOLUS (SEPSIS)
1000.0000 mL | Freq: Once | INTRAVENOUS | Status: AC
  Administered 2021-07-09: 1000 mL via INTRAVENOUS

## 2021-07-09 MED ORDER — LACTATED RINGERS IV BOLUS
20.0000 mL/kg | Freq: Once | INTRAVENOUS | Status: AC
  Administered 2021-07-09: 1724 mL via INTRAVENOUS

## 2021-07-09 MED ORDER — PANTOPRAZOLE INFUSION (NEW) - SIMPLE MED
8.0000 mg/h | INTRAVENOUS | Status: AC
  Administered 2021-07-09 – 2021-07-12 (×6): 8 mg/h via INTRAVENOUS
  Filled 2021-07-09 (×2): qty 80
  Filled 2021-07-09 (×2): qty 100
  Filled 2021-07-09: qty 80
  Filled 2021-07-09 (×2): qty 100
  Filled 2021-07-09 (×2): qty 80
  Filled 2021-07-09: qty 100

## 2021-07-09 MED ORDER — INSULIN ASPART 100 UNIT/ML IJ SOLN
0.0000 [IU] | Freq: Every day | INTRAMUSCULAR | Status: DC
  Administered 2021-07-11: 2 [IU] via SUBCUTANEOUS

## 2021-07-09 MED ORDER — FENTANYL CITRATE PF 50 MCG/ML IJ SOSY
50.0000 ug | PREFILLED_SYRINGE | Freq: Once | INTRAMUSCULAR | Status: AC
  Administered 2021-07-09: 50 ug via INTRAVENOUS

## 2021-07-09 MED ORDER — ONDANSETRON HCL 4 MG/2ML IJ SOLN
4.0000 mg | Freq: Four times a day (QID) | INTRAMUSCULAR | Status: DC | PRN
  Administered 2021-07-09: 4 mg via INTRAVENOUS
  Filled 2021-07-09: qty 2

## 2021-07-09 MED ORDER — DILTIAZEM LOAD VIA INFUSION
15.0000 mg | Freq: Once | INTRAVENOUS | Status: AC
  Administered 2021-07-09: 15 mg via INTRAVENOUS
  Filled 2021-07-09: qty 15

## 2021-07-09 MED ORDER — ALUM & MAG HYDROXIDE-SIMETH 200-200-20 MG/5ML PO SUSP
60.0000 mL | Freq: Two times a day (BID) | ORAL | Status: DC
  Administered 2021-07-09 – 2021-07-10 (×3): 60 mL via ORAL
  Filled 2021-07-09 (×4): qty 60

## 2021-07-09 MED ORDER — HYOSCYAMINE SULFATE 0.125 MG PO TBDP
0.1250 mg | ORAL_TABLET | Freq: Once | ORAL | Status: AC
  Administered 2021-07-09: 0.125 mg via ORAL
  Filled 2021-07-09 (×2): qty 1

## 2021-07-09 MED ORDER — DILTIAZEM HCL-DEXTROSE 125-5 MG/125ML-% IV SOLN (PREMIX)
5.0000 mg/h | INTRAVENOUS | Status: DC
  Administered 2021-07-09: 5 mg/h via INTRAVENOUS
  Filled 2021-07-09: qty 125

## 2021-07-09 MED ORDER — METOCLOPRAMIDE HCL 5 MG/ML IJ SOLN
10.0000 mg | Freq: Once | INTRAMUSCULAR | Status: AC
  Administered 2021-07-09: 10 mg via INTRAVENOUS
  Filled 2021-07-09: qty 2

## 2021-07-09 MED ORDER — DILTIAZEM HCL 60 MG PO TABS
60.0000 mg | ORAL_TABLET | Freq: Three times a day (TID) | ORAL | Status: DC
  Administered 2021-07-09 – 2021-07-12 (×9): 60 mg via ORAL
  Filled 2021-07-09 (×12): qty 1

## 2021-07-09 MED ORDER — PANTOPRAZOLE 80MG IVPB - SIMPLE MED
80.0000 mg | Freq: Once | INTRAVENOUS | Status: AC
  Administered 2021-07-09: 80 mg via INTRAVENOUS
  Filled 2021-07-09: qty 80

## 2021-07-09 MED ORDER — DILTIAZEM HCL 25 MG/5ML IV SOLN
10.0000 mg | Freq: Once | INTRAVENOUS | Status: AC
  Administered 2021-07-09: 10 mg via INTRAVENOUS
  Filled 2021-07-09: qty 5

## 2021-07-09 NOTE — Assessment & Plan Note (Signed)
Patient states he is on 90 mg of methadone today.  We will continue this but this will need to be verified with his methadone clinic.  Wife to get [place] the name of the methadone clinic and phone number.

## 2021-07-09 NOTE — Progress Notes (Signed)
PROGRESS NOTE                                                                                                                                                                                                             Patient Demographics:    Martin Downs, is a 55 y.o. male, DOB - May 03, 1966, QMG:[id number]  Outpatient Primary MD for the patient is Lynnda Child, MD    LOS - 0  Admit date - 07/08/2021    Chief Complaint  Patient presents with   Emesis       Brief Narrative (HPI from H&P)  55 year old male with a history of type 2 diabetes on insulin, chronic methadone user, hyperlipidemia presents to the ER today with 4-day history of nausea and vomiting, he also had gradually progressive epigastric abdominal pain which is at times radiating to his back, for some reason he stopped taking his insulin for the last 4 weeks, in the ER he was diagnosed with DKA, new A. fib RVR along with CT evidence of peptic ulcer disease and duodenitis and he was admitted for further   Subjective:    Martin Downs today has, No headache, No chest pain, ++ abdominal pain - No Nausea, No new weakness tingling or numbness, no SOB   Assessment  & Plan :     DKA in a patient with DM type II due to insulin noncompliance - he has been strictly counseled on insulin compliance in the presence of his wife on 07/09/2021, DKA is much better, will transition him to Lantus twice daily dose along with sliding scale.  Monitor and adjust.  Tinea gentle hydration  2.  Dehydration with hyponatremia.  Due to #1 above.  Gentle LR to continue.  3.  Severe epigastric pain due to possible peptic ulcer disease along with duodenitis as evident on CT scan.  Clear liquid diet, IV PPI drip for now, GI consulted and will require EGD most likely this admission.  Lipase is borderline, no CT evidence of pancreatitis.  4.  Chronic pain and chronic narcotic use.  Continue home  dose methadone.  Strictly counseled not to overuse narcotics and get oversedated.  As needed Narcan also added  5.  Dyslipidemia.  Home dose statin and fenofibrate resumed.  6. Mood Disorder.  On Risperdal continued.  He is also on Depakote,  he does not clearly not recall the reason right now.  7.  Transient A. fib upon admission.  Could be due to pain and dehydration, [place] vas 2 score is 1.  TSH and echo will be checked, low-dose oral Cardizem for now.        Condition - Extremely Guarded  Family Communication  :  wife bedside 07/09/21  Code Status :  Full  Consults  :  GI  PUD Prophylaxis : PPI   Procedures  :     CT - 1. Moderate acute inflammation throughout the duodenum. Mild secondary inflammation of the distal CBD, and the right anterior pararenal space, but no bile duct enlargement. Top differential considerations are acute peptic ulcer disease and infectious duodenitis. No free air or free fluid. 2. No other acute or inflammatory process identified in the abdomen or pelvis. Normal appendix. 3. Punctate bilateral nephrolithiasis. Calcified coronary artery and Aortic Atherosclerosis (ICD10-I70.0).      Disposition Plan  :    Status is: Inpt    DVT Prophylaxis  :    enoxaparin (LOVENOX) injection 40 mg Start: 07/09/21 1800 SCDs Start: 07/09/21 0538   Lab Results  Component Value Date   PLT 403 (H) 07/08/2021    Diet :  Diet Order             Diet clear liquid Room service appropriate? Yes; Fluid consistency: Thin  Diet effective now                    Inpatient Medications  Scheduled Meds:  diltiazem  30 mg Oral Q6H   divalproex  500 mg Oral Daily   enoxaparin (LOVENOX) injection  40 mg Subcutaneous Q24H   fenofibrate  160 mg Oral Daily   fentaNYL       insulin glargine-yfgn  35 Units Subcutaneous BID   methadone  90 mg Oral Daily   [START ON 07/12/2021] pantoprazole  40 mg Intravenous Q12H   risperiDONE  0.5 mg Oral BID   rosuvastatin  10 mg  Oral Daily   Continuous Infusions:  sodium chloride Stopped (07/09/21 0859)   dextrose 5% lactated ringers Stopped (07/09/21 0858)   dextrose 5% lactated ringers     insulin Stopped (07/09/21 0857)   lactated ringers 125 mL/hr at 07/09/21 0905   pantoprazole 8 mg/hr (07/09/21 0729)   PRN Meds:.acetaminophen **OR** acetaminophen, dextrose, ondansetron **OR** ondansetron (ZOFRAN) IV  Antibiotics  :    Anti-infectives (From admission, onward)    None        Time Spent in minutes  30   Lala Lund M.D on 07/09/2021 at 9:54 AM  To page go to [email or web address]   [facility] -  Office  [phone]  See all Orders from today for further details    Objective:   Vitals:   07/09/21 0715 07/09/21 0735 07/09/21 0800 07/09/21 0900  BP: 118/64  121/67 126/61  Pulse: 76  75 77  Resp: 12  (!) 25 14  Temp:  (!) 97.5 F (36.4 C)    TempSrc:  Temporal    SpO2: 100%  99% 99%  Weight:      Height:        Wt Readings from Last 3 Encounters:  07/09/21 86.2 kg  02/07/21 89.5 kg  12/12/20 87.7 kg     Intake/Output Summary (Last 24 hours) at 07/09/2021 0954 Last data filed at 07/09/2021 0859 Gross per 24 hour  Intake 3251.19 ml  Output 650 ml  Net  2601.19 ml     Physical Exam  Awake Alert, No new F.N deficits, Normal affect [place].AT,PERRAL Supple Neck, No JVD,   Symmetrical Chest wall movement, Good air movement bilaterally, CTAB RRR,No Gallops,Rubs or new Murmurs,  +ve B.Sounds, Abd Soft, +ve epigastric tenderness,   No Cyanosis, Clubbing or edema        Data Review:    CBC Recent Labs  Lab 07/08/21 2327 07/09/21 0015  WBC 17.6*  --   HGB 18.2* 18.4*  HCT 52.0 54.0*  PLT 403*  --   MCV 85.0  --   MCH 29.7  --   MCHC 35.0  --   RDW 12.8  --   LYMPHSABS 0.9  --   MONOABS 1.5*  --   EOSABS 0.0  --   BASOSABS 0.0  --     Electrolytes Recent Labs  Lab 07/08/21 0445 07/08/21 2327 07/08/21 2346 07/09/21 0002 07/09/21 0015 07/09/21 0600  NA   --  123*  --   --  122* 128*  K  --  5.1  --   --  5.3* 4.0  CL  --  91*  --   --   --  102  CO2  --  11*  --   --   --  12*  GLUCOSE  --  539*  --   --   --  162*  BUN  --  37*  --   --   --  28*  CREATININE  --  1.62*  --   --   --  1.06  CALCIUM  --  8.2*  --   --   --  7.4*  AST  --  14*  --   --   --   --   ALT  --  17  --   --   --   --   ALKPHOS  --  65  --   --   --   --   BILITOT  --  1.7*  --   --   --   --   ALBUMIN  --  3.8  --   --   --   --   MG  --   --  2.6*  --   --   --   LATICACIDVEN 1.1  --   --  1.8  --   --   HGBA1C  --   --   --   --   --  11.5*    ------------------------------------------------------------------------------------------------------------------ No results for input(s): CHOL, HDL, LDLCALC, TRIG, CHOLHDL, LDLDIRECT in the last 72 hours.  Lab Results  Component Value Date   HGBA1C 11.5 (H) 07/09/2021    No results for input(s): TSH, T4TOTAL, T3FREE, THYROIDAB in the last 72 hours.  Invalid input(s): FREET3 ------------------------------------------------------------------------------------------------------------------ ID Labs Recent Labs  Lab 07/08/21 0445 07/08/21 2327 07/09/21 0002 07/09/21 0600  WBC  --  17.6*  --   --   PLT  --  403*  --   --   LATICACIDVEN 1.1  --  1.8  --   CREATININE  --  1.62*  --  1.06   Cardiac Enzymes No results for input(s): CKMB, TROPONINI, MYOGLOBIN in the last 168 hours.  Invalid input(s): CK     Radiology Reports DG Abdomen 1 View  Result Date: 07/09/2021 CLINICAL DATA:  Abdominal pain, nausea and vomiting.  Tachycardia. EXAM: PORTABLE CHEST - 1 VIEW; ABDOMEN - 1 VIEW COMPARISON:  09/23/2018. FINDINGS: The heart  and mediastinal silhouette are within normal limits. Lung volumes are low. No consolidation, effusion, or pneumothorax. There is a nonobstructive bowel gas pattern. No free air is identified. Surgical clips are present in the right upper quadrant. Degenerative changes are present in  the thoracolumbar spine and bilateral hips. IMPRESSION: 1. No acute process in the chest. 2. No bowel obstruction or free air. Electronically Signed   By: Brett Fairy M.D.   On: 07/09/2021 00:42   CT ABDOMEN PELVIS W CONTRAST  Result Date: 07/09/2021 CLINICAL DATA:  55 year old male with abdominal pain, nausea vomiting diarrhea for a few days. Uncontrolled diabetes. EXAM: CT ABDOMEN AND PELVIS WITH CONTRAST TECHNIQUE: Multidetector CT imaging of the abdomen and pelvis was performed using the standard protocol following bolus administration of intravenous contrast. CONTRAST:  58mL ISOVUE-370 IOPAMIDOL (ISOVUE-370) INJECTION 76% COMPARISON:  CT Abdomen and Pelvis 04/16/2017. FINDINGS: Lower chest: Calcified coronary artery atherosclerosis. No cardiomegaly or pericardial effusion. Lower lung volumes, mild lung base atelectasis. Hepatobiliary: Chronically absent gallbladder. Liver enhancement within normal limits. No bile duct dilatation. Pancreas: Fatty atrophied throughout. No pancreatic ductal dilatation. Spleen: Negative. Adrenals/Urinary Tract: Normal adrenal glands. Secondary anterior right pararenal space inflammation, see duodenum below. The knees appear symmetric and nonobstructed. There is punctate bilateral nephrolithiasis. Symmetric renal contrast excretion on the delayed images. Negative ureters and bladder. Stomach/Bowel: Redundant large bowel with mild retained stool throughout. Small volume of fluid in the right colon. No convincing large bowel inflammation. Normal appendix on series 3, image 55. Decompressed and negative terminal ileum. No dilated small bowel. However, there is pronounced mucosal hyperenhancement and inflammation throughout the duodenum. See series 3, images 29 through 41 and coronal image 49. Indistinct duodenal wall with inflammatory stranding. And the distal CBD also appears to be enhancing although is nondilated (coronal image 48). No regional free air or free fluid.  Inflammatory stranding does involve the anterior right pararenal space. The stomach and ligament of Treitz have a more normal appearance. No other bowel inflammation identified. Vascular/Lymphatic: Mild Aortoiliac calcified atherosclerosis. Major arterial structures in the abdomen and pelvis are patent. Portal venous system is patent. No lymphadenopathy. Reproductive: Negative. Other: No pelvic free fluid. Musculoskeletal: T12 superior endplate Schmorl's node is new since 2018. No acute osseous abnormality identified. IMPRESSION: 1. Moderate acute inflammation throughout the duodenum. Mild secondary inflammation of the distal CBD, and the right anterior pararenal space, but no bile duct enlargement. Top differential considerations are acute peptic ulcer disease and infectious duodenitis. No free air or free fluid. 2. No other acute or inflammatory process identified in the abdomen or pelvis. Normal appendix. 3. Punctate bilateral nephrolithiasis. Calcified coronary artery and Aortic Atherosclerosis (ICD10-I70.0). Electronically Signed   By: Genevie Ann M.D.   On: 07/09/2021 04:37   DG Chest Portable 1 View  Result Date: 07/09/2021 CLINICAL DATA:  Abdominal pain, nausea and vomiting.  Tachycardia. EXAM: PORTABLE CHEST - 1 VIEW; ABDOMEN - 1 VIEW COMPARISON:  09/23/2018. FINDINGS: The heart and mediastinal silhouette are within normal limits. Lung volumes are low. No consolidation, effusion, or pneumothorax. There is a nonobstructive bowel gas pattern. No free air is identified. Surgical clips are present in the right upper quadrant. Degenerative changes are present in the thoracolumbar spine and bilateral hips. IMPRESSION: 1. No acute process in the chest. 2. No bowel obstruction or free air. Electronically Signed   By: Brett Fairy M.D.   On: 07/09/2021 00:42

## 2021-07-09 NOTE — ED Notes (Signed)
Thedore Mins MD made aware of pts HR in 150s. EKG obtained.

## 2021-07-09 NOTE — Progress Notes (Signed)
Pt converted to NSR Change to po cardizem.

## 2021-07-09 NOTE — ED Notes (Signed)
No change in Insulin rate

## 2021-07-09 NOTE — Assessment & Plan Note (Signed)
Patient admits to not taking his insulin for approximately 4 days.  He states that he does not take his insulin because his sugars are high.  His wife is in disbelief of his noncompliance.

## 2021-07-09 NOTE — ED Notes (Signed)
Pt given 10mg  Cardizem IV. HR 95 Afib

## 2021-07-09 NOTE — H&P (View-Only) (Signed)
 Referring Provider: Dr. Singh, TRH Primary Care Physician:  Cody, Jessica R, MD Primary Gastroenterologist:  ? Dr. Hung vs Dr. Jacobs  Reason for Consultation:  Vomiting, ? ulcer  HPI: Martin Downs is a 55 y.o. male with PMH of type 2 diabetes on insulin, chronic methadone user, hyperlipidemia who presented to the ER at [facility] with 4-day history of nausea and vomiting along with abdominal pain.  No blood noted in emesis and no black or bloody stools.  Patient tells me that he stopped taking his medications at home even before these symptoms started.  He tells me that he has been taking ibuprofen, multiple per day, but not necessarily every day.  Not a great historian.  CT scan of the abdomen and pelvis with contrast showed the following:   IMPRESSION: 1. Moderate acute inflammation throughout the duodenum. Mild secondary inflammation of the distal CBD, and the right anterior pararenal space, but no bile duct enlargement. Top differential considerations are acute peptic ulcer disease and infectious duodenitis. No free air or free fluid.   2. No other acute or inflammatory process identified in the abdomen or pelvis. Normal appendix.   3. Punctate bilateral nephrolithiasis. Calcified coronary artery and Aortic Atherosclerosis (ICD10-I70.0).  White blood cell count was elevated at 17.6K.  Lactic acid is normal.  Blood sugar of over 500 initially.  Lipase minimally elevated at 62.  Sodium 123.  Acute kidney injury of creatinine 1.62.  Looks like that he had an episode of rapid A. fib, but then converted to normal sinus rhythm.  Looks like he had a colonoscopy that was incomplete by Dr. Jacobs in 2017.  I was told that he was a patient of Dr. Hung's.  Patient unsure of last time he had been seen by Dr. Hung.   Past Medical History:  Diagnosis Date   Acute gangrenous cholecystitis s/p cholecystectomy 05/30/2016 05/31/2016   Anxiety    Borderline diabetes    Cholecystitis  05/30/2016   Chronic pain    Common bile duct repair with T-Tube 11/192017 05/31/2016   Depression    Diabetes mellitus without complication (HCC)    Hyperlipidemia    Narcotic dependence (HCC)    Sleep apnea    no cpap    Past Surgical History:  Procedure Laterality Date   CHOLECYSTECTOMY N/A 05/30/2016   Procedure: LAPAROSCOPIC CHOLECYSTECTOMY WITH INTRAOPERATIVE CHOLANGIOGRAM;  Surgeon: Matthew Tsuei, MD;  Location: [facility] OR;  Service: General;  Laterality: N/A;   CHOLECYSTECTOMY N/A 05/30/2016   Procedure: OPEN COMMON BILE DUCT EXPLORATION; INSERTION OF T-TUBE;  Surgeon: Matthew Tsuei, MD;  Location: [facility] OR;  Service: General;  Laterality: N/A;   removal of bone spur Bilateral 1995   elbps   TOOTH EXTRACTION  2017   UMBILICAL HERNIA REPAIR N/A 05/30/2016   Procedure: HERNIA REPAIR UMBILICAL ADULT;  Surgeon: Matthew Tsuei, MD;  Location: [facility] OR;  Service: General;  Laterality: N/A;    Prior to Admission medications   Medication Sig Start Date End Date Taking? Authorizing Provider  methadone (DOLOPHINE) 10 MG/ML solution Take 90 mg by mouth daily.   Yes [provider]  aspirin 81 MG tablet Take 81 mg by mouth daily.    [provider]  BD DISP NEEDLES 18G X 1-1/2" MISC USE TO DRAW UP TESTOSTERONE 08/26/19   Gherghe, Cristina, MD  BD INSULIN SYRINGE U/F 31G X 5/16" 0.5 ML MISC USE 3 TIMES DAILY 09/19/17   Gherghe, Cristina, MD  BD PEN NEEDLE NANO 2ND GEN   32G X 4 MM MISC USE TO INJECT INSULIN 1 TIME DAILY AS INSTRUCTED. 01/12/21   Gherghe, Cristina, MD  Blood Glucose Monitoring Suppl (ONE TOUCH ULTRA MINI) W/DEVICE KIT Use as directed to test blood sugar twice daily 250.00 04/29/14   Aron, Talia M, MD  clotrimazole (LOTRIMIN) 1 % cream Apply 1 application topically 2 (two) times daily. 12/12/20   Cody, Jessica R, MD  Continuous Blood Gluc Receiver (FREESTYLE LIBRE 2 READER) DEVI 1 each by Does not apply route daily. 11/21/20   Gherghe, Cristina, MD  Continuous Blood Gluc Sensor  (FREESTYLE LIBRE 2 SENSOR) MISC 1 each by Does not apply route every 14 (fourteen) days. 11/21/20   Gherghe, Cristina, MD  divalproex (DEPAKOTE ER) 500 MG 24 hr tablet Take 500 mg by mouth daily. 10/13/20   [provider]  empagliflozin (JARDIANCE) 25 MG TABS tablet Take 1 tablet (25 mg total) by mouth daily. 11/21/20   Gherghe, Cristina, MD  escitalopram (LEXAPRO) 20 MG tablet Take 1 tablet (20 mg total) by mouth daily. 12/12/20   Cody, Jessica R, MD  fenofibrate 160 MG tablet Take 1 tablet (160 mg total) by mouth daily. 02/25/20   Cody, Jessica R, MD  fluticasone (FLONASE) 50 MCG/ACT nasal spray INSTILL 2 SPRAYS INTO EACH NOSTRIL EVERY DAY 09/22/19   Cody, Jessica R, MD  glucose blood (ONETOUCH VERIO) test strip Use to test blood sugar 4 times daily-Dx code E11.65 03/31/20   Gherghe, Cristina, MD  hydrOXYzine (VISTARIL) 25 MG capsule Take 25 mg by mouth 2 (two) times daily. 10/24/20   [provider]  insulin glargine (LANTUS SOLOSTAR) 100 UNIT/ML Solostar Pen Inject 60-80 units at bedtime 01/26/21   Gherghe, Cristina, MD  insulin lispro (HUMALOG KWIKPEN) 200 UNIT/ML KwikPen Inject 10-14 units before the 3 meals of the day 11/21/20   Gherghe, Cristina, MD  Insulin Syringe-Needle U-100 (B-D INSULIN SYRINGE 1CC/25GX1") 25G X 1" 1 ML MISC USE TO INJECT TESTOSTERONE WEEKLY 08/26/19   Gherghe, Cristina, MD  metFORMIN (GLUCOPHAGE) 1000 MG tablet Take 1 tablet 2x a day 11/21/20   Gherghe, Cristina, MD  Multiple Vitamin (MULTIVITAMIN) tablet Take 2 tablets by mouth 2 (two) times daily.    [provider]  OneTouch Delica Lancets 33G MISC USE TO TEST BLOOD SUGAR 4 TIMES DAILY-DX code E11.65 03/31/20   Gherghe, Cristina, MD  OZEMPIC, 1 MG/DOSE, 4 MG/3ML SOPN INJECT 1MG INTO THE SKIN ONCE A WEEK 03/20/21   Gherghe, Cristina, MD  risperiDONE (RISPERDAL) 0.5 MG tablet Take 0.5 mg by mouth 2 (two) times daily. 10/19/20   [provider]  rosuvastatin (CRESTOR) 10 MG tablet TAKE 1 TABLET BY  MOUTH EVERY DAY 03/13/21   Cody, Jessica R, MD  SYRINGE-NEEDLE, DISP, 3 ML (B-D 3CC LUER-LOK SYR 18GX1-1/2) 18G X 1-1/2" 3 ML MISC USE TO DRAW UP TESTOSTERONE 11/14/18   Gherghe, Cristina, MD  Syringe/Needle, Disp, 18G X 1" 1 ML MISC Use to draw up testosterone 03/04/17   Gherghe, Cristina, MD  testosterone cypionate (DEPOTESTOSTERONE CYPIONATE) 200 MG/ML injection INJECT 0.25MLS INTO THE MUSCLE ONCE WEEKLY 03/21/18   Gherghe, Cristina, MD  traZODone (DESYREL) 50 MG tablet Take 50 mg by mouth at bedtime. 10/26/20   [provider]  insulin aspart (NOVOLOG FLEXPEN) 100 UNIT/ML FlexPen Inject 10-15 Units into the skin 3 (three) times daily with meals. 01/23/19 11/23/19  Gherghe, Cristina, MD    Current Facility-Administered Medications  Medication Dose Route Frequency Provider Last Rate Last Admin   0.9 %  sodium   chloride infusion  1,000 mL Intravenous Continuous Chen, Eric, DO   Stopped at 07/09/21 0859   diltiazem (CARDIZEM) tablet 30 mg  30 mg Oral Q6H Chen, Eric, DO   30 mg at 07/09/21 0733   divalproex (DEPAKOTE ER) 24 hr tablet 500 mg  500 mg Oral Daily Singh, Prashant K, MD       enoxaparin (LOVENOX) injection 40 mg  40 mg Subcutaneous Q24H Chen, Eric, DO       fenofibrate tablet 160 mg  160 mg Oral Daily Singh, Prashant K, MD       fentaNYL (SUBLIMAZE) 100 MCG/2ML injection            insulin aspart (novoLOG) injection 0-5 Units  0-5 Units Subcutaneous QHS Singh, Prashant K, MD       insulin aspart (novoLOG) injection 0-9 Units  0-9 Units Subcutaneous TID WC Singh, Prashant K, MD       insulin glargine-yfgn (SEMGLEE) injection 35 Units  35 Units Subcutaneous BID Singh, Prashant K, MD   35 Units at 07/09/21 0855   lactated ringers infusion   Intravenous Continuous Singh, Prashant K, MD       methadone (DOLOPHINE) tablet 90 mg  90 mg Oral Daily Chen, Eric, DO   90 mg at 07/09/21 0853   ondansetron (ZOFRAN) tablet 4 mg  4 mg Oral Q6H PRN Chen, Eric, DO       Or   ondansetron (ZOFRAN)  injection 4 mg  4 mg Intravenous Q6H PRN Chen, Eric, DO       [START ON 07/12/2021] pantoprazole (PROTONIX) injection 40 mg  40 mg Intravenous Q12H Chen, Eric, DO       pantoprozole (PROTONIX) 80 mg /NS 100 mL infusion  8 mg/hr Intravenous Continuous Chen, Eric, DO 10 mL/hr at 07/09/21 0729 8 mg/hr at 07/09/21 0729   risperiDONE (RISPERDAL) tablet 0.5 mg  0.5 mg Oral BID Singh, Prashant K, MD       rosuvastatin (CRESTOR) tablet 10 mg  10 mg Oral Daily Singh, Prashant K, MD       Current Outpatient Medications  Medication Sig Dispense Refill   methadone (DOLOPHINE) 10 MG/ML solution Take 90 mg by mouth daily.     aspirin 81 MG tablet Take 81 mg by mouth daily.     BD DISP NEEDLES 18G X 1-1/2" MISC USE TO DRAW UP TESTOSTERONE 4 each 24   BD INSULIN SYRINGE U/F 31G X 5/16" 0.5 ML MISC USE 3 TIMES DAILY 100 each 0   BD PEN NEEDLE NANO 2ND GEN 32G X 4 MM MISC USE TO INJECT INSULIN 1 TIME DAILY AS INSTRUCTED. 100 each 3   Blood Glucose Monitoring Suppl (ONE TOUCH ULTRA MINI) W/DEVICE KIT Use as directed to test blood sugar twice daily 250.00 1 each 0   clotrimazole (LOTRIMIN) 1 % cream Apply 1 application topically 2 (two) times daily. 30 g 0   Continuous Blood Gluc Receiver (FREESTYLE LIBRE 2 READER) DEVI 1 each by Does not apply route daily. 1 each 0   Continuous Blood Gluc Sensor (FREESTYLE LIBRE 2 SENSOR) MISC 1 each by Does not apply route every 14 (fourteen) days. 6 each 3   divalproex (DEPAKOTE ER) 500 MG 24 hr tablet Take 500 mg by mouth daily.     empagliflozin (JARDIANCE) 25 MG TABS tablet Take 1 tablet (25 mg total) by mouth daily. 90 tablet 3   escitalopram (LEXAPRO) 20 MG tablet Take 1 tablet (20 mg total) by mouth daily. 90   tablet 1   fenofibrate 160 MG tablet Take 1 tablet (160 mg total) by mouth daily. 90 tablet 1   fluticasone (FLONASE) 50 MCG/ACT nasal spray INSTILL 2 SPRAYS INTO EACH NOSTRIL EVERY DAY 16 mL 1   glucose blood (ONETOUCH VERIO) test strip Use to test blood sugar 4  times daily-Dx code E11.65 400 each 2   hydrOXYzine (VISTARIL) 25 MG capsule Take 25 mg by mouth 2 (two) times daily.     insulin glargine (LANTUS SOLOSTAR) 100 UNIT/ML Solostar Pen Inject 60-80 units at bedtime 45 mL 3   insulin lispro (HUMALOG KWIKPEN) 200 UNIT/ML KwikPen Inject 10-14 units before the 3 meals of the day 18 mL 3   Insulin Syringe-Needle U-100 (B-D INSULIN SYRINGE 1CC/25GX1") 25G X 1" 1 ML MISC USE TO INJECT TESTOSTERONE WEEKLY 4 each 99   metFORMIN (GLUCOPHAGE) 1000 MG tablet Take 1 tablet 2x a day 180 tablet 3   Multiple Vitamin (MULTIVITAMIN) tablet Take 2 tablets by mouth 2 (two) times daily.     OneTouch Delica Lancets 33G MISC USE TO TEST BLOOD SUGAR 4 TIMES DAILY-DX code E11.65 200 each 3   OZEMPIC, 1 MG/DOSE, 4 MG/3ML SOPN INJECT 1MG INTO THE SKIN ONCE A WEEK 9 mL 3   risperiDONE (RISPERDAL) 0.5 MG tablet Take 0.5 mg by mouth 2 (two) times daily.     rosuvastatin (CRESTOR) 10 MG tablet TAKE 1 TABLET BY MOUTH EVERY DAY 90 tablet 2   SYRINGE-NEEDLE, DISP, 3 ML (B-D 3CC LUER-LOK SYR 18GX1-1/2) 18G X 1-1/2" 3 ML MISC USE TO DRAW UP TESTOSTERONE 1 each 1   Syringe/Needle, Disp, 18G X 1" 1 ML MISC Use to draw up testosterone 100 each 5   testosterone cypionate (DEPOTESTOSTERONE CYPIONATE) 200 MG/ML injection INJECT 0.25MLS INTO THE MUSCLE ONCE WEEKLY 1 mL 1   traZODone (DESYREL) 50 MG tablet Take 50 mg by mouth at bedtime.      Allergies as of 07/08/2021 - Review Complete 07/08/2021  Allergen Reaction Noted   Gemfibrozil Diarrhea     Family History  Problem Relation Age of Onset   Hypertension Mother    Cancer Father        not sure of type   Diabetes Father    Hypertension Father    Heart attack Father 72   Diabetes Brother    Hypertension Brother    Heart attack Brother        around 60 years of age   Kidney cancer Brother        stage 4-1 kidney removed   Colon cancer Neg Hx     Social History   Socioeconomic History   Marital status: Married    Spouse  name: Amy   Number of children: 2   Years of education: Not on file   Highest education level: High school graduate  Occupational History   Occupation: mechanic    Employer: UNEMPLOYED  Tobacco Use   Smoking status: Never   Smokeless tobacco: Former    Types: Snuff    Quit date: 03/30/2020  Vaping Use   Vaping Use: Never used  Substance and Sexual Activity   Alcohol use: No    Comment: quit 2009   Drug use: Not Currently   Sexual activity: Yes    Birth control/protection: Post-menopausal  Other Topics Concern   Not on file  Social History Narrative   11/23/19   From: the area   Living: with wife Amy, high school [employer] hearts   Work: water   plant operator, works parttime for a trucking company      Family: 2 children - Lindsay and Logan - grown and out of the house      Enjoys: not sure, works too much, watch TV      Exercise: walking at his job - bending/lifting   Diet: tries to do diabetic diet - but only partially, low appetite      Safety   Seat belts: Yes    Guns: declined   Safe in relationships: Yes    Social Determinants of Health   Financial Resource Strain: Not on file  Food Insecurity: Not on file  Transportation Needs: Not on file  Physical Activity: Not on file  Stress: Not on file  Social Connections: Not on file  Intimate Partner Violence: Not on file    Review of Systems: ROS is O/W negative except as mentioned in HPI.  Physical Exam: Vital signs in last 24 hours: Temp:  [97.5 F (36.4 C)-98.4 F (36.9 C)] 97.5 F (36.4 C) (12/11 0735) Pulse Rate:  [66-108] 77 (12/11 0900) Resp:  [11-25] 14 (12/11 0900) BP: (105-134)/(50-112) 126/61 (12/11 0900) SpO2:  [99 %-100 %] 99 % (12/11 0900) Weight:  [86.2 kg] 86.2 kg (12/11 0530)   General:  Alert, resting comfortably but appears miserable, cooperative in NAD Head:  Normocephalic and atraumatic. Eyes:  Sclera clear, no icterus.  Conjunctiva pink. Ears:  Normal auditory acuity. Mouth:  No  deformity or lesions.   Lungs:  Clear throughout to auscultation.  No wheezes, crackles, or rhonchi.  Heart:  Regular rate and rhythm; no murmurs, clicks, rubs, or gallops. Abdomen:  Soft, non-distended.  BS present.  Diffuse TTP. Rectal:  Deferred.  Msk:  Symmetrical without gross deformities. Pulses:  Normal pulses noted. Extremities:  Without clubbing or edema. Neurologic:  Alert and oriented x 4;  grossly normal neurologically. Skin:  Intact without significant lesions or rashes. Psych:  Alert and cooperative. Normal mood and affect.  Intake/Output from previous day: 12/10 0701 - 12/11 0700 In: -  Out: 650 [Urine:650] Intake/Output this shift: Total I/O In: 3251.2 [I.V.:1424.6; IV Piggyback:1826.6] Out: -   Lab Results: Recent Labs    07/08/21 2327 07/09/21 0015  WBC 17.6*  --   HGB 18.2* 18.4*  HCT 52.0 54.0*  PLT 403*  --    BMET Recent Labs    07/08/21 2327 07/09/21 0015 07/09/21 0600  NA 123* 122* 128*  K 5.1 5.3* 4.0  CL 91*  --  102  CO2 11*  --  12*  GLUCOSE 539*  --  162*  BUN 37*  --  28*  CREATININE 1.62*  --  1.06  CALCIUM 8.2*  --  7.4*   LFT Recent Labs    07/08/21 2327  PROT 6.7  ALBUMIN 3.8  AST 14*  ALT 17  ALKPHOS 65  BILITOT 1.7*   Studies/Results: DG Abdomen 1 View  Result Date: 07/09/2021 CLINICAL DATA:  Abdominal pain, nausea and vomiting.  Tachycardia. EXAM: PORTABLE CHEST - 1 VIEW; ABDOMEN - 1 VIEW COMPARISON:  09/23/2018. FINDINGS: The heart and mediastinal silhouette are within normal limits. Lung volumes are low. No consolidation, effusion, or pneumothorax. There is a nonobstructive bowel gas pattern. No free air is identified. Surgical clips are present in the right upper quadrant. Degenerative changes are present in the thoracolumbar spine and bilateral hips. IMPRESSION: 1. No acute process in the chest. 2. No bowel obstruction or free air. Electronically Signed   By: Laura    Taylor M.D.   On: 07/09/2021 00:42   CT ABDOMEN  PELVIS W CONTRAST  Result Date: 07/09/2021 CLINICAL DATA:  55-year-old male with abdominal pain, nausea vomiting diarrhea for a few days. Uncontrolled diabetes. EXAM: CT ABDOMEN AND PELVIS WITH CONTRAST TECHNIQUE: Multidetector CT imaging of the abdomen and pelvis was performed using the standard protocol following bolus administration of intravenous contrast. CONTRAST:  65mL ISOVUE-370 IOPAMIDOL (ISOVUE-370) INJECTION 76% COMPARISON:  CT Abdomen and Pelvis 04/16/2017. FINDINGS: Lower chest: Calcified coronary artery atherosclerosis. No cardiomegaly or pericardial effusion. Lower lung volumes, mild lung base atelectasis. Hepatobiliary: Chronically absent gallbladder. Liver enhancement within normal limits. No bile duct dilatation. Pancreas: Fatty atrophied throughout. No pancreatic ductal dilatation. Spleen: Negative. Adrenals/Urinary Tract: Normal adrenal glands. Secondary anterior right pararenal space inflammation, see duodenum below. The knees appear symmetric and nonobstructed. There is punctate bilateral nephrolithiasis. Symmetric renal contrast excretion on the delayed images. Negative ureters and bladder. Stomach/Bowel: Redundant large bowel with mild retained stool throughout. Small volume of fluid in the right colon. No convincing large bowel inflammation. Normal appendix on series 3, image 55. Decompressed and negative terminal ileum. No dilated small bowel. However, there is pronounced mucosal hyperenhancement and inflammation throughout the duodenum. See series 3, images 29 through 41 and coronal image 49. Indistinct duodenal wall with inflammatory stranding. And the distal CBD also appears to be enhancing although is nondilated (coronal image 48). No regional free air or free fluid. Inflammatory stranding does involve the anterior right pararenal space. The stomach and ligament of Treitz have a more normal appearance. No other bowel inflammation identified. Vascular/Lymphatic: Mild Aortoiliac  calcified atherosclerosis. Major arterial structures in the abdomen and pelvis are patent. Portal venous system is patent. No lymphadenopathy. Reproductive: Negative. Other: No pelvic free fluid. Musculoskeletal: T12 superior endplate Schmorl's node is new since 2018. No acute osseous abnormality identified. IMPRESSION: 1. Moderate acute inflammation throughout the duodenum. Mild secondary inflammation of the distal CBD, and the right anterior pararenal space, but no bile duct enlargement. Top differential considerations are acute peptic ulcer disease and infectious duodenitis. No free air or free fluid. 2. No other acute or inflammatory process identified in the abdomen or pelvis. Normal appendix. 3. Punctate bilateral nephrolithiasis. Calcified coronary artery and Aortic Atherosclerosis (ICD10-I70.0). Electronically Signed   By: H  Hall M.D.   On: 07/09/2021 04:37   DG Chest Portable 1 View  Result Date: 07/09/2021 CLINICAL DATA:  Abdominal pain, nausea and vomiting.  Tachycardia. EXAM: PORTABLE CHEST - 1 VIEW; ABDOMEN - 1 VIEW COMPARISON:  09/23/2018. FINDINGS: The heart and mediastinal silhouette are within normal limits. Lung volumes are low. No consolidation, effusion, or pneumothorax. There is a nonobstructive bowel gas pattern. No free air is identified. Surgical clips are present in the right upper quadrant. Degenerative changes are present in the thoracolumbar spine and bilateral hips. IMPRESSION: 1. No acute process in the chest. 2. No bowel obstruction or free air. Electronically Signed   By: Laura  Taylor M.D.   On: 07/09/2021 00:42    IMPRESSION:  *55-year-old male presenting with complaints of nausea, vomiting/dry heaves and abdominal pain for 4 days.  CT scan showing possible duodenal ulcer versus infectious duodenitis.  Has been using ibuprofen, multiple per day.  Lactic acid okay. *DKA: Came in with blood sugar over 500.  Had not been taking his medications at home. *Leukocytosis with  white blood cell count of 17.6K *Acute kidney injury *Hyponatremia with a sodium of 123.  PLAN: *We will plan for EGD on 12/12. *PPI   drip has been by the admitting team.  We will continue that for now. *Antiemetics and pain control.  IV hydration. *Okay for clear liquids if patient desires, but then n.p.o. after midnight. *Monitor/trend labs.   Jessica D. Zehr  07/09/2021, 10:01 AM  I have taken an interval history, reviewed the chart and examined the patient. I agree with the Advanced Practitioner's note, impression and recommendations.  Majority the medical decision-making in the formulation of the assessment and plan were performed by me. I also personally reviewed the CT scan images. My additional thoughts are as follows:  Several days of escalating upper abdominal pain, nausea and vomiting.  In DKA, not compliant with diabetic treatment, hemoglobin A1c 11.5.  Significant electrolyte derangement, AKI. Regular NSAID use, CT scan suggest probable duodenal ulcer.  He is tender in the right upper quadrant without exam signs of perforation. No GI bleeding at present.  N.p.o. now, Protonix drip Primary team is itching volume resuscitation and glucose control. He is tentatively on the schedule for an upper endoscopy tomorrow.  He will need to be reevaluated in the morning to ensure better control of glucose and improvement in electrolyte derangement prior to undergoing sedation for the procedure.  Dr. Armbruster will assume consult management of this patient tomorrow.   Martin Downs Office:[phone]     

## 2021-07-09 NOTE — Progress Notes (Signed)
CT abd shows duodenitis. Pt with continued abd pain. Starting protonix gtts.

## 2021-07-09 NOTE — Assessment & Plan Note (Signed)
Reportedly on a statin.  Unclear if he is actually taking it.

## 2021-07-09 NOTE — H&P (Signed)
History and Physical    Martin Downs YWV:[id number] DOB: 05/11/1966 DOA: 07/08/2021  PCP: Lesleigh Noe, MD   Patient coming from: Home  I have personally briefly reviewed patient's old medical records in [place]  CC: N/V for 4 days HPI: 55 year old male with a history of type 2 diabetes on insulin, chronic methadone user, hyperlipidemia presents to the ER today with 4-day history of nausea and vomiting.  He has had 4 days of dry heaves.  He is only had 1-2 episodes of actual vomit.  Patient did not take his meds for 4 straight days.  Has had elevated blood sugars at home.  Patient states he did not take his insulin because his sugars are high.  His wife is in disbelief of the patient's noncompliance.  Patient presented to the ER tachycardic with a heart rate of 165.  Labs consistent with DKA including serum glucose of 539, bicarbonate of 11, beta hydroxybutyrate of 6.6.  Venous pH is 7.16 PCO2 34 PO2 39  Chest x-ray is negative.  KUB is negative.  White count 17.6.  During the interview, the patient went into a irregular fast heart rhythm.  On bedside telemetry appears to be rapid A. fib.  EKG to confirm A. fib is pending.  Patient started on insulin drip.  Due to continued abdominal pain, EDP is ordering a CT scan of the abdomen.  [facility] hospitalist contacted for admission due to DKA.   ED Course: labs notable for DKA. CXR negative. KUB negative. Developed rapid afib in ER.   Review of Systems:  Review of Systems  Constitutional:  Positive for malaise/fatigue. Negative for chills and fever.  HENT: Negative.    Eyes: Negative.   Respiratory: Negative.    Cardiovascular: Negative.   Gastrointestinal:  Positive for abdominal pain, nausea and vomiting.  Genitourinary:  Positive for frequency.  Musculoskeletal: Negative.   Skin: Negative.   Neurological:  Positive for headaches.  Endo/Heme/Allergies:  Positive for polydipsia.  Psychiatric/Behavioral:  Positive  for substance abuse.        Long term methadone use. Wife thinks pt is addicted to methadone  All other systems reviewed and are negative.  Past Medical History:  Diagnosis Date   Acute gangrenous cholecystitis s/p cholecystectomy 05/30/2016 05/31/2016   Anxiety    Borderline diabetes    Cholecystitis 05/30/2016   Chronic pain    Common bile duct repair with T-Tube 11/192017 05/31/2016   Depression    Diabetes mellitus without complication ([place])    Hyperlipidemia    Narcotic dependence ([place])    Sleep apnea    no cpap    Past Surgical History:  Procedure Laterality Date   CHOLECYSTECTOMY N/A 05/30/2016   Procedure: LAPAROSCOPIC CHOLECYSTECTOMY WITH INTRAOPERATIVE CHOLANGIOGRAM;  Surgeon: Donnie Mesa, MD;  Location: [facility];  Service: General;  Laterality: N/A;   CHOLECYSTECTOMY N/A 05/30/2016   Procedure: OPEN COMMON BILE DUCT EXPLORATION; INSERTION OF T-TUBE;  Surgeon: Donnie Mesa, MD;  Location: [facility];  Service: General;  Laterality: N/A;   removal of bone spur Bilateral 1995   elbps   TOOTH EXTRACTION  8546   UMBILICAL HERNIA REPAIR N/A 05/30/2016   Procedure: HERNIA REPAIR UMBILICAL ADULT;  Surgeon: Donnie Mesa, MD;  Location: [facility];  Service: General;  Laterality: N/A;     reports that he has never smoked. He quit smokeless tobacco use about 15 months ago.  His smokeless tobacco use included snuff. He reports that he does not currently use drugs. He  reports that he does not drink alcohol.  Allergies  Allergen Reactions   Gemfibrozil Diarrhea    Family History  Problem Relation Age of Onset   Hypertension Mother    Cancer Father        not sure of type   Diabetes Father    Hypertension Father    Heart attack Father 13   Diabetes Brother    Hypertension Brother    Heart attack Brother        around 55 years of age   Kidney cancer Brother        stage 4-1 kidney removed   Colon cancer Neg Hx     Prior to Admission medications   Medication Sig Start Date End  Date Taking? Authorizing Provider  aspirin 81 MG tablet Take 81 mg by mouth daily.    [provider]  BD DISP NEEDLES 18G X 1-1/2" MISC USE TO DRAW UP TESTOSTERONE 08/26/19   Philemon Kingdom, MD  BD INSULIN SYRINGE U/F 31G X 5/16" 0.5 ML MISC USE 3 TIMES DAILY 09/19/17   Philemon Kingdom, MD  BD PEN NEEDLE NANO 2ND GEN 32G X 4 MM MISC USE TO INJECT INSULIN 1 TIME DAILY AS INSTRUCTED. 01/12/21   Philemon Kingdom, MD  Blood Glucose Monitoring Suppl (ONE TOUCH ULTRA MINI) W/DEVICE KIT Use as directed to test blood sugar twice daily 250.00 04/29/14   Lucille Passy, MD  clotrimazole (LOTRIMIN) 1 % cream Apply 1 application topically 2 (two) times daily. 12/12/20   Lesleigh Noe, MD  Continuous Blood Gluc Receiver (FREESTYLE LIBRE 2 READER) DEVI 1 each by Does not apply route daily. 11/21/20   Philemon Kingdom, MD  Continuous Blood Gluc Sensor (FREESTYLE LIBRE 2 SENSOR) MISC 1 each by Does not apply route every 14 (fourteen) days. 11/21/20   Philemon Kingdom, MD  divalproex (DEPAKOTE ER) 500 MG 24 hr tablet Take by mouth. 10/13/20   [provider]  empagliflozin (JARDIANCE) 25 MG TABS tablet Take 1 tablet (25 mg total) by mouth daily. 11/21/20   Philemon Kingdom, MD  escitalopram (LEXAPRO) 20 MG tablet Take 1 tablet (20 mg total) by mouth daily. 12/12/20   Lesleigh Noe, MD  fenofibrate 160 MG tablet Take 1 tablet (160 mg total) by mouth daily. 02/25/20   Lesleigh Noe, MD  fluticasone [facility]) 50 MCG/ACT nasal spray INSTILL 2 SPRAYS INTO EACH NOSTRIL EVERY DAY 09/22/19   Lesleigh Noe, MD  glucose blood [facility] VERIO) test strip Use to test blood sugar 4 times daily-Dx code E11.65 03/31/20   Philemon Kingdom, MD  hydrOXYzine (VISTARIL) 25 MG capsule Take 25 mg by mouth 2 (two) times daily. 10/24/20   [provider]  insulin glargine (LANTUS SOLOSTAR) 100 UNIT/ML Solostar Pen Inject 60-80 units at bedtime 01/26/21   Philemon Kingdom, MD  insulin lispro (HUMALOG KWIKPEN)  200 UNIT/ML KwikPen Inject 10-14 units before the 3 meals of the day 11/21/20   Philemon Kingdom, MD  Insulin Syringe-Needle U-100 (B-D INSULIN SYRINGE 1CC/25GX1") 25G X 1" 1 ML MISC USE TO INJECT TESTOSTERONE WEEKLY 08/26/19   Philemon Kingdom, MD  metFORMIN (GLUCOPHAGE) 1000 MG tablet Take 1 tablet 2x a day 11/21/20   Philemon Kingdom, MD  methadone (DOLOPHINE) 10 MG/ML solution Take 180 mg by mouth daily.     [provider]  Multiple Vitamin (MULTIVITAMIN) tablet Take 2 tablets by mouth 2 (two) times daily.    [provider]  OneTouch Delica Lancets 36O MISC USE TO  TEST BLOOD SUGAR 4 TIMES DAILY-DX code E11.65 03/31/20   Philemon Kingdom, MD  OZEMPIC, 1 MG/DOSE, 4 MG/3ML SOPN INJECT $RemoveBef'1MG'cRTmOCRqgw$  INTO THE SKIN ONCE A WEEK 03/20/21   Philemon Kingdom, MD  risperiDONE (RISPERDAL) 0.5 MG tablet Take 0.5 mg by mouth 2 (two) times daily. 10/19/20   [provider]  rosuvastatin (CRESTOR) 10 MG tablet TAKE 1 TABLET BY MOUTH EVERY DAY 03/13/21   Lesleigh Noe, MD  SYRINGE-NEEDLE, DISP, 3 ML (B-D 3CC LUER-LOK SYR 18GX1-1/2) 18G X 1-1/2" 3 ML MISC USE TO DRAW UP TESTOSTERONE 11/14/18   Philemon Kingdom, MD  Syringe/Needle, Disp, 18G X 1" 1 ML MISC Use to draw up testosterone 03/04/17   Philemon Kingdom, MD  testosterone cypionate (DEPOTESTOSTERONE CYPIONATE) 200 MG/ML injection INJECT 0.25MLS INTO THE MUSCLE ONCE WEEKLY 03/21/18   Philemon Kingdom, MD  traZODone (DESYREL) 50 MG tablet Take 50 mg by mouth at bedtime. 10/26/20   [provider]  insulin aspart (NOVOLOG FLEXPEN) 100 UNIT/ML FlexPen Inject 10-15 Units into the skin 3 (three) times daily with meals. 01/23/19 11/23/19  Philemon Kingdom, MD    Physical Exam: Vitals:   07/08/21 2304 07/09/21 0130  BP: 108/88 108/69  Pulse: 82 82  Resp: 20 17  Temp: 98.4 F (36.9 C)   TempSrc: Oral   SpO2: 100% 100%    Physical Exam Vitals and nursing note reviewed.  Constitutional:      General: He is not in acute distress.     Appearance: Normal appearance. He is normal weight. He is ill-appearing. He is not toxic-appearing or diaphoretic.  HENT:     Head: Normocephalic and atraumatic.     Nose: Nose normal. No rhinorrhea.  Eyes:     General:        Right eye: No discharge.        Left eye: No discharge.  Cardiovascular:     Rate and Rhythm: Tachycardia present. Rhythm irregular.     Pulses: Normal pulses.  Pulmonary:     Effort: Pulmonary effort is normal. No respiratory distress.     Breath sounds: Normal breath sounds.  Abdominal:     General: Abdomen is flat. Bowel sounds are normal.     Tenderness: There is generalized abdominal tenderness. There is guarding. There is no rebound.     Comments: Diffusely tender  Musculoskeletal:        General: Normal range of motion.     Right lower leg: No edema.     Left lower leg: No edema.  Skin:    General: Skin is warm and dry.     Capillary Refill: Capillary refill takes less than 2 seconds.  Neurological:     General: No focal deficit present.     Mental Status: He is alert and oriented to person, place, and time.     Labs on Admission: I have personally reviewed following labs and imaging studies  CBC: Recent Labs  Lab 07/08/21 2327 07/09/21 0015  WBC 17.6*  --   NEUTROABS 15.1*  --   HGB 18.2* 18.4*  HCT 52.0 54.0*  MCV 85.0  --   PLT 403*  --    Basic Metabolic Panel: Recent Labs  Lab 07/08/21 2327 07/08/21 2346 07/09/21 0015  NA 123*  --  122*  K 5.1  --  5.3*  CL 91*  --   --   CO2 11*  --   --   GLUCOSE 539*  --   --   BUN  37*  --   --   CREATININE 1.62*  --   --   CALCIUM 8.2*  --   --   MG  --  2.6*  --    GFR: CrCl cannot be calculated (Unknown ideal weight.). Liver Function Tests: Recent Labs  Lab 07/08/21 2327  AST 14*  ALT 17  ALKPHOS 65  BILITOT 1.7*  PROT 6.7  ALBUMIN 3.8   Recent Labs  Lab 07/08/21 2327  LIPASE 62*   No results for input(s): AMMONIA in the last 168 hours. Coagulation Profile: No  results for input(s): INR, PROTIME in the last 168 hours. Cardiac Enzymes: No results for input(s): CKTOTAL, CKMB, CKMBINDEX, TROPONINI in the last 168 hours. BNP (last 3 results) No results for input(s): PROBNP in the last 8760 hours. HbA1C: No results for input(s): HGBA1C in the last 72 hours. CBG: Recent Labs  Lab 07/08/21 2302 07/09/21 0121 07/09/21 0220  GLUCAP 480* 406* 319*   Lipid Profile: No results for input(s): CHOL, HDL, LDLCALC, TRIG, CHOLHDL, LDLDIRECT in the last 72 hours. Thyroid Function Tests: No results for input(s): TSH, T4TOTAL, FREET4, T3FREE, THYROIDAB in the last 72 hours. Anemia Panel: No results for input(s): VITAMINB12, FOLATE, FERRITIN, TIBC, IRON, RETICCTPCT in the last 72 hours. Urine analysis:    Component Value Date/Time   COLORURINE STRAW (A) 07/08/2021 2327   APPEARANCEUR CLEAR 07/08/2021 2327   LABSPEC 1.026 07/08/2021 2327   PHURINE 6.0 07/08/2021 2327   GLUCOSEU >=500 (A) 07/08/2021 2327   HGBUR NEGATIVE 07/08/2021 2327   BILIRUBINUR NEGATIVE 07/08/2021 2327   KETONESUR 80 (A) 07/08/2021 2327   PROTEINUR NEGATIVE 07/08/2021 2327   NITRITE NEGATIVE 07/08/2021 2327   LEUKOCYTESUR NEGATIVE 07/08/2021 2327    Radiological Exams on Admission: I have personally reviewed images DG Abdomen 1 View  Result Date: 07/09/2021 CLINICAL DATA:  Abdominal pain, nausea and vomiting.  Tachycardia. EXAM: PORTABLE CHEST - 1 VIEW; ABDOMEN - 1 VIEW COMPARISON:  09/23/2018. FINDINGS: The heart and mediastinal silhouette are within normal limits. Lung volumes are low. No consolidation, effusion, or pneumothorax. There is a nonobstructive bowel gas pattern. No free air is identified. Surgical clips are present in the right upper quadrant. Degenerative changes are present in the thoracolumbar spine and bilateral hips. IMPRESSION: 1. No acute process in the chest. 2. No bowel obstruction or free air. Electronically Signed   By: Brett Fairy M.D.   On: 07/09/2021  00:42   DG Chest Portable 1 View  Result Date: 07/09/2021 CLINICAL DATA:  Abdominal pain, nausea and vomiting.  Tachycardia. EXAM: PORTABLE CHEST - 1 VIEW; ABDOMEN - 1 VIEW COMPARISON:  09/23/2018. FINDINGS: The heart and mediastinal silhouette are within normal limits. Lung volumes are low. No consolidation, effusion, or pneumothorax. There is a nonobstructive bowel gas pattern. No free air is identified. Surgical clips are present in the right upper quadrant. Degenerative changes are present in the thoracolumbar spine and bilateral hips. IMPRESSION: 1. No acute process in the chest. 2. No bowel obstruction or free air. Electronically Signed   By: Brett Fairy M.D.   On: 07/09/2021 00:42    EKG: I have personally reviewed EKG: telemetry shows rapid afib.  Assessment/Plan Principal Problem:   DKA, type 2 (HCC) Active Problems:   Atrial fibrillation with RVR (HCC)   Noncompliance with medication regimen   Pure hypercholesterolemia   Long-term current use of methadone for opiate dependence ([place])    DKA, type 2 ([place]) Admit to progressive care. DKA protocol.  Atrial  fibrillation with RVR ([place]) Likely due to the patient's acidosis.  Start Cardizem drip.  Hold off on anticoagulation for now.  CHAD-VASC score of 1  Noncompliance with medication regimen Patient admits to not taking his insulin for approximately 4 days.  He states that he does not take his insulin because his sugars are high.  His wife is in disbelief of his noncompliance.  Pure hypercholesterolemia Reportedly on a statin.  Unclear if he is actually taking it.  Long-term current use of methadone for opiate dependence [facility]) Patient states he is on 90 mg of methadone today.  We will continue this but this will need to be verified with his methadone clinic.  Wife to get [place] the name of the methadone clinic and phone number.  DVT prophylaxis: Lovenox Code Status: Full Code Family Communication: discussed with pt and wife amy  at bedside  Disposition Plan: return home  Consults called: none  Admission status: Observation, Step Down Unit   Kristopher Oppenheim, DO [facility] 07/09/2021, 2:39 AM

## 2021-07-09 NOTE — Consult Note (Addendum)
Referring Provider: Dr. Candiss Norse, [facility] Primary Care Physician:  Lesleigh Noe, MD Primary Gastroenterologist:  ? Dr. Benson Norway vs Dr. Ardis Hughs  Reason for Consultation:  Vomiting, ? ulcer  HPI: Martin Downs is a 55 y.o. male with PMH of type 2 diabetes on insulin, chronic methadone user, hyperlipidemia who presented to the ER at [facility] hospital with 4-day history of nausea and vomiting along with abdominal pain.  No blood noted in emesis and no black or bloody stools.  Patient tells me that he stopped taking his medications at home even before these symptoms started.  He tells me that he has been taking ibuprofen, multiple per day, but not necessarily every day.  Not a great historian.  CT scan of the abdomen and pelvis with contrast showed the following:   IMPRESSION: 1. Moderate acute inflammation throughout the duodenum. Mild secondary inflammation of the distal CBD, and the right anterior pararenal space, but no bile duct enlargement. Top differential considerations are acute peptic ulcer disease and infectious duodenitis. No free air or free fluid.   2. No other acute or inflammatory process identified in the abdomen or pelvis. Normal appendix.   3. Punctate bilateral nephrolithiasis. Calcified coronary artery and Aortic Atherosclerosis (ICD10-I70.0).  White blood cell count was elevated at 17.6K.  Lactic acid is normal.  Blood sugar of over 500 initially.  Lipase minimally elevated at 62.  Sodium 123.  Acute kidney injury of creatinine 1.62.  Looks like that he had an episode of rapid A. fib, but then converted to normal sinus rhythm.  Looks like he had a colonoscopy that was incomplete by Dr. Ardis Hughs in 2017.  I was told that he was a patient of Dr. Ulyses Amor.  Patient unsure of last time he had been seen by Dr. Benson Norway.   Past Medical History:  Diagnosis Date   Acute gangrenous cholecystitis s/p cholecystectomy 05/30/2016 05/31/2016   Anxiety    Borderline diabetes    Cholecystitis  05/30/2016   Chronic pain    Common bile duct repair with T-Tube 11/192017 05/31/2016   Depression    Diabetes mellitus without complication ([place])    Hyperlipidemia    Narcotic dependence ([place])    Sleep apnea    no cpap    Past Surgical History:  Procedure Laterality Date   CHOLECYSTECTOMY N/A 05/30/2016   Procedure: LAPAROSCOPIC CHOLECYSTECTOMY WITH INTRAOPERATIVE CHOLANGIOGRAM;  Surgeon: Donnie Mesa, MD;  Location: [place];  Service: General;  Laterality: N/A;   CHOLECYSTECTOMY N/A 05/30/2016   Procedure: OPEN COMMON BILE DUCT EXPLORATION; INSERTION OF T-TUBE;  Surgeon: Donnie Mesa, MD;  Location: [place];  Service: General;  Laterality: N/A;   removal of bone spur Bilateral 1995   elbps   TOOTH EXTRACTION  0258   UMBILICAL HERNIA REPAIR N/A 05/30/2016   Procedure: HERNIA REPAIR UMBILICAL ADULT;  Surgeon: Donnie Mesa, MD;  Location: [place];  Service: General;  Laterality: N/A;    Prior to Admission medications   Medication Sig Start Date End Date Taking? Authorizing Provider  methadone (DOLOPHINE) 10 MG/ML solution Take 90 mg by mouth daily.   Yes [provider]  aspirin 81 MG tablet Take 81 mg by mouth daily.    [provider]  BD DISP NEEDLES 18G X 1-1/2" MISC USE TO DRAW UP TESTOSTERONE 08/26/19   Philemon Kingdom, MD  BD INSULIN SYRINGE U/F 31G X 5/16" 0.5 ML MISC USE 3 TIMES DAILY 09/19/17   Philemon Kingdom, MD  BD PEN NEEDLE NANO 2ND GEN  32G X 4 MM MISC USE TO INJECT INSULIN 1 TIME DAILY AS INSTRUCTED. 01/12/21   Philemon Kingdom, MD  Blood Glucose Monitoring Suppl (ONE TOUCH ULTRA MINI) W/DEVICE KIT Use as directed to test blood sugar twice daily 250.00 04/29/14   Lucille Passy, MD  clotrimazole (LOTRIMIN) 1 % cream Apply 1 application topically 2 (two) times daily. 12/12/20   Lesleigh Noe, MD  Continuous Blood Gluc Receiver (FREESTYLE LIBRE 2 READER) DEVI 1 each by Does not apply route daily. 11/21/20   Philemon Kingdom, MD  Continuous Blood Gluc Sensor  (FREESTYLE LIBRE 2 SENSOR) MISC 1 each by Does not apply route every 14 (fourteen) days. 11/21/20   Philemon Kingdom, MD  divalproex (DEPAKOTE ER) 500 MG 24 hr tablet Take 500 mg by mouth daily. 10/13/20   [provider]  empagliflozin (JARDIANCE) 25 MG TABS tablet Take 1 tablet (25 mg total) by mouth daily. 11/21/20   Philemon Kingdom, MD  escitalopram (LEXAPRO) 20 MG tablet Take 1 tablet (20 mg total) by mouth daily. 12/12/20   Lesleigh Noe, MD  fenofibrate 160 MG tablet Take 1 tablet (160 mg total) by mouth daily. 02/25/20   Lesleigh Noe, MD  fluticasone [facility]) 50 MCG/ACT nasal spray INSTILL 2 SPRAYS INTO EACH NOSTRIL EVERY DAY 09/22/19   Lesleigh Noe, MD  glucose blood [facility] VERIO) test strip Use to test blood sugar 4 times daily-Dx code E11.65 03/31/20   Philemon Kingdom, MD  hydrOXYzine (VISTARIL) 25 MG capsule Take 25 mg by mouth 2 (two) times daily. 10/24/20   [provider]  insulin glargine (LANTUS SOLOSTAR) 100 UNIT/ML Solostar Pen Inject 60-80 units at bedtime 01/26/21   Philemon Kingdom, MD  insulin lispro (HUMALOG KWIKPEN) 200 UNIT/ML KwikPen Inject 10-14 units before the 3 meals of the day 11/21/20   Philemon Kingdom, MD  Insulin Syringe-Needle U-100 (B-D INSULIN SYRINGE 1CC/25GX1") 25G X 1" 1 ML MISC USE TO INJECT TESTOSTERONE WEEKLY 08/26/19   Philemon Kingdom, MD  metFORMIN (GLUCOPHAGE) 1000 MG tablet Take 1 tablet 2x a day 11/21/20   Philemon Kingdom, MD  Multiple Vitamin (MULTIVITAMIN) tablet Take 2 tablets by mouth 2 (two) times daily.    [provider]  OneTouch Delica Lancets 93G MISC USE TO TEST BLOOD SUGAR 4 TIMES DAILY-DX code E11.65 03/31/20   Philemon Kingdom, MD  OZEMPIC, 1 MG/DOSE, 4 MG/3ML SOPN INJECT 1MG INTO THE SKIN ONCE A WEEK 03/20/21   Philemon Kingdom, MD  risperiDONE (RISPERDAL) 0.5 MG tablet Take 0.5 mg by mouth 2 (two) times daily. 10/19/20   [provider]  rosuvastatin (CRESTOR) 10 MG tablet TAKE 1 TABLET BY  MOUTH EVERY DAY 03/13/21   Lesleigh Noe, MD  SYRINGE-NEEDLE, DISP, 3 ML (B-D 3CC LUER-LOK SYR 18GX1-1/2) 18G X 1-1/2" 3 ML MISC USE TO DRAW UP TESTOSTERONE 11/14/18   Philemon Kingdom, MD  Syringe/Needle, Disp, 18G X 1" 1 ML MISC Use to draw up testosterone 03/04/17   Philemon Kingdom, MD  testosterone cypionate (DEPOTESTOSTERONE CYPIONATE) 200 MG/ML injection INJECT 0.25MLS INTO THE MUSCLE ONCE WEEKLY 03/21/18   Philemon Kingdom, MD  traZODone (DESYREL) 50 MG tablet Take 50 mg by mouth at bedtime. 10/26/20   [provider]  insulin aspart (NOVOLOG FLEXPEN) 100 UNIT/ML FlexPen Inject 10-15 Units into the skin 3 (three) times daily with meals. 01/23/19 11/23/19  Philemon Kingdom, MD    Current Facility-Administered Medications  Medication Dose Route Frequency Provider Last Rate Last Admin   0.9 %  sodium  chloride infusion  1,000 mL Intravenous Continuous Kristopher Oppenheim, DO   Stopped at 07/09/21 0859   diltiazem (CARDIZEM) tablet 30 mg  30 mg Oral Q6H Kristopher Oppenheim, DO   30 mg at 07/09/21 0277   divalproex (DEPAKOTE ER) 24 hr tablet 500 mg  500 mg Oral Daily Thurnell Lose, MD       enoxaparin (LOVENOX) injection 40 mg  40 mg Subcutaneous Q24H Kristopher Oppenheim, DO       fenofibrate tablet 160 mg  160 mg Oral Daily Thurnell Lose, MD       fentaNYL (SUBLIMAZE) 100 MCG/2ML injection            insulin aspart (novoLOG) injection 0-5 Units  0-5 Units Subcutaneous QHS Thurnell Lose, MD       insulin aspart (novoLOG) injection 0-9 Units  0-9 Units Subcutaneous TID WC Thurnell Lose, MD       insulin glargine-yfgn (SEMGLEE) injection 35 Units  35 Units Subcutaneous BID Thurnell Lose, MD   35 Units at 07/09/21 0855   lactated ringers infusion   Intravenous Continuous Thurnell Lose, MD       methadone (DOLOPHINE) tablet 90 mg  90 mg Oral Daily Kristopher Oppenheim, DO   90 mg at 07/09/21 0853   ondansetron (ZOFRAN) tablet 4 mg  4 mg Oral Q6H PRN Kristopher Oppenheim, DO       Or   ondansetron [facility])  injection 4 mg  4 mg Intravenous Q6H PRN Kristopher Oppenheim, DO       [START ON 07/12/2021] pantoprazole (PROTONIX) injection 40 mg  40 mg Intravenous Q12H Kristopher Oppenheim, DO       pantoprozole (PROTONIX) 80 mg /NS 100 mL infusion  8 mg/hr Intravenous Continuous Kristopher Oppenheim, DO 10 mL/hr at 07/09/21 0729 8 mg/hr at 07/09/21 0729   risperiDONE (RISPERDAL) tablet 0.5 mg  0.5 mg Oral BID Thurnell Lose, MD       rosuvastatin (CRESTOR) tablet 10 mg  10 mg Oral Daily Thurnell Lose, MD       Current Outpatient Medications  Medication Sig Dispense Refill   methadone (DOLOPHINE) 10 MG/ML solution Take 90 mg by mouth daily.     aspirin 81 MG tablet Take 81 mg by mouth daily.     BD DISP NEEDLES 18G X 1-1/2" MISC USE TO DRAW UP TESTOSTERONE 4 each 24   BD INSULIN SYRINGE U/F 31G X 5/16" 0.5 ML MISC USE 3 TIMES DAILY 100 each 0   BD PEN NEEDLE NANO 2ND GEN 32G X 4 MM MISC USE TO INJECT INSULIN 1 TIME DAILY AS INSTRUCTED. 100 each 3   Blood Glucose Monitoring Suppl (ONE TOUCH ULTRA MINI) W/DEVICE KIT Use as directed to test blood sugar twice daily 250.00 1 each 0   clotrimazole (LOTRIMIN) 1 % cream Apply 1 application topically 2 (two) times daily. 30 g 0   Continuous Blood Gluc Receiver (FREESTYLE LIBRE 2 READER) DEVI 1 each by Does not apply route daily. 1 each 0   Continuous Blood Gluc Sensor (FREESTYLE LIBRE 2 SENSOR) MISC 1 each by Does not apply route every 14 (fourteen) days. 6 each 3   divalproex (DEPAKOTE ER) 500 MG 24 hr tablet Take 500 mg by mouth daily.     empagliflozin (JARDIANCE) 25 MG TABS tablet Take 1 tablet (25 mg total) by mouth daily. 90 tablet 3   escitalopram (LEXAPRO) 20 MG tablet Take 1 tablet (20 mg total) by mouth daily. [place]  tablet 1   fenofibrate 160 MG tablet Take 1 tablet (160 mg total) by mouth daily. 90 tablet 1   fluticasone (FLONASE) 50 MCG/ACT nasal spray INSTILL 2 SPRAYS INTO EACH NOSTRIL EVERY DAY 16 mL 1   glucose blood (ONETOUCH VERIO) test strip Use to test blood sugar 4  times daily-Dx code E11.65 400 each 2   hydrOXYzine (VISTARIL) 25 MG capsule Take 25 mg by mouth 2 (two) times daily.     insulin glargine (LANTUS SOLOSTAR) 100 UNIT/ML Solostar Pen Inject 60-80 units at bedtime 45 mL 3   insulin lispro (HUMALOG KWIKPEN) 200 UNIT/ML KwikPen Inject 10-14 units before the 3 meals of the day 18 mL 3   Insulin Syringe-Needle U-100 (B-D INSULIN SYRINGE 1CC/25GX1") 25G X 1" 1 ML MISC USE TO INJECT TESTOSTERONE WEEKLY 4 each 99   metFORMIN (GLUCOPHAGE) 1000 MG tablet Take 1 tablet 2x a day 180 tablet 3   Multiple Vitamin (MULTIVITAMIN) tablet Take 2 tablets by mouth 2 (two) times daily.     OneTouch Delica Lancets 45W MISC USE TO TEST BLOOD SUGAR 4 TIMES DAILY-DX code E11.65 200 each 3   OZEMPIC, 1 MG/DOSE, 4 MG/3ML SOPN INJECT 1MG INTO THE SKIN ONCE A WEEK 9 mL 3   risperiDONE (RISPERDAL) 0.5 MG tablet Take 0.5 mg by mouth 2 (two) times daily.     rosuvastatin (CRESTOR) 10 MG tablet TAKE 1 TABLET BY MOUTH EVERY DAY 90 tablet 2   SYRINGE-NEEDLE, DISP, 3 ML (B-D 3CC LUER-LOK SYR 18GX1-1/2) 18G X 1-1/2" 3 ML MISC USE TO DRAW UP TESTOSTERONE 1 each 1   Syringe/Needle, Disp, 18G X 1" 1 ML MISC Use to draw up testosterone 100 each 5   testosterone cypionate (DEPOTESTOSTERONE CYPIONATE) 200 MG/ML injection INJECT 0.25MLS INTO THE MUSCLE ONCE WEEKLY 1 mL 1   traZODone (DESYREL) 50 MG tablet Take 50 mg by mouth at bedtime.      Allergies as of 07/08/2021 - Review Complete 07/08/2021  Allergen Reaction Noted   Gemfibrozil Diarrhea     Family History  Problem Relation Age of Onset   Hypertension Mother    Cancer Father        not sure of type   Diabetes Father    Hypertension Father    Heart attack Father 49   Diabetes Brother    Hypertension Brother    Heart attack Brother        around 12 years of age   Kidney cancer Brother        stage 4-1 kidney removed   Colon cancer Neg Hx     Social History   Socioeconomic History   Marital status: Married    Spouse  name: Amy   Number of children: 2   Years of education: Not on file   Highest education level: High school graduate  Occupational History   Occupation: Music therapist: UNEMPLOYED  Tobacco Use   Smoking status: Never   Smokeless tobacco: Former    Types: Snuff    Quit date: 03/30/2020  Vaping Use   Vaping Use: Never used  Substance and Sexual Activity   Alcohol use: No    Comment: quit 2009   Drug use: Not Currently   Sexual activity: Yes    Birth control/protection: Post-menopausal  Other Topics Concern   Not on file  Social History Narrative   11/23/19   From: the area   Living: with wife Amy, high school sweet hearts   Work: water  plant Mining engineer, works Warehouse manager for a [place]      Family: 2 children - Wellsite geologist and Sport and exercise psychologist - grown and out of the house      Enjoys: not sure, works too much, watch TV      Exercise: walking at his job - bending/lifting   Diet: tries to do diabetic diet - but only partially, low appetite      Safety   Seat belts: Yes    Guns: declined   Safe in relationships: Yes    Social Determinants of Radio broadcast assistant Strain: Not on file  Food Insecurity: Not on file  Transportation Needs: Not on file  Physical Activity: Not on file  Stress: Not on file  Social Connections: Not on file  Intimate Partner Violence: Not on file    Review of Systems: ROS is O/W negative except as mentioned in HPI.  Physical Exam: Vital signs in last 24 hours: Temp:  [97.5 F (36.4 C)-98.4 F (36.9 C)] 97.5 F (36.4 C) (12/11 0735) Pulse Rate:  [66-108] 77 (12/11 0900) Resp:  [11-25] 14 (12/11 0900) BP: (105-134)/(50-112) 126/61 (12/11 0900) SpO2:  [99 %-100 %] 99 % (12/11 0900) Weight:  [86.2 kg] 86.2 kg (12/11 0530)   General:  Alert, resting comfortably but appears miserable, cooperative in NAD Head:  Normocephalic and atraumatic. Eyes:  Sclera clear, no icterus.  Conjunctiva pink. Ears:  Normal auditory acuity. Mouth:  No  deformity or lesions.   Lungs:  Clear throughout to auscultation.  No wheezes, crackles, or rhonchi.  Heart:  Regular rate and rhythm; no murmurs, clicks, rubs, or gallops. Abdomen:  Soft, non-distended.  BS present.  Diffuse TTP. Rectal:  Deferred.  Msk:  Symmetrical without gross deformities. Pulses:  Normal pulses noted. Extremities:  Without clubbing or edema. Neurologic:  Alert and oriented x 4;  grossly normal neurologically. Skin:  Intact without significant lesions or rashes. Psych:  Alert and cooperative. Normal mood and affect.  Intake/Output from previous day: 12/10 0701 - 12/11 0700 In: -  Out: 650 [Urine:650] Intake/Output this shift: Total I/O In: 3251.2 [I.V.:1424.6; IV Piggyback:1826.6] Out: -   Lab Results: Recent Labs    07/08/21 2327 07/09/21 0015  WBC 17.6*  --   HGB 18.2* 18.4*  HCT 52.0 54.0*  PLT 403*  --    BMET Recent Labs    07/08/21 2327 07/09/21 0015 07/09/21 0600  NA 123* 122* 128*  K 5.1 5.3* 4.0  CL 91*  --  102  CO2 11*  --  12*  GLUCOSE 539*  --  162*  BUN 37*  --  28*  CREATININE 1.62*  --  1.06  CALCIUM 8.2*  --  7.4*   LFT Recent Labs    07/08/21 2327  PROT 6.7  ALBUMIN 3.8  AST 14*  ALT 17  ALKPHOS 65  BILITOT 1.7*   Studies/Results: DG Abdomen 1 View  Result Date: 07/09/2021 CLINICAL DATA:  Abdominal pain, nausea and vomiting.  Tachycardia. EXAM: PORTABLE CHEST - 1 VIEW; ABDOMEN - 1 VIEW COMPARISON:  09/23/2018. FINDINGS: The heart and mediastinal silhouette are within normal limits. Lung volumes are low. No consolidation, effusion, or pneumothorax. There is a nonobstructive bowel gas pattern. No free air is identified. Surgical clips are present in the right upper quadrant. Degenerative changes are present in the thoracolumbar spine and bilateral hips. IMPRESSION: 1. No acute process in the chest. 2. No bowel obstruction or free air. Electronically Signed   By: Mickel Baas  Lovena Le M.D.   On: 07/09/2021 00:42   CT ABDOMEN  PELVIS W CONTRAST  Result Date: 07/09/2021 CLINICAL DATA:  55 year old male with abdominal pain, nausea vomiting diarrhea for a few days. Uncontrolled diabetes. EXAM: CT ABDOMEN AND PELVIS WITH CONTRAST TECHNIQUE: Multidetector CT imaging of the abdomen and pelvis was performed using the standard protocol following bolus administration of intravenous contrast. CONTRAST:  79m ISOVUE-370 IOPAMIDOL (ISOVUE-370) INJECTION 76% COMPARISON:  CT Abdomen and Pelvis 04/16/2017. FINDINGS: Lower chest: Calcified coronary artery atherosclerosis. No cardiomegaly or pericardial effusion. Lower lung volumes, mild lung base atelectasis. Hepatobiliary: Chronically absent gallbladder. Liver enhancement within normal limits. No bile duct dilatation. Pancreas: Fatty atrophied throughout. No pancreatic ductal dilatation. Spleen: Negative. Adrenals/Urinary Tract: Normal adrenal glands. Secondary anterior right pararenal space inflammation, see duodenum below. The knees appear symmetric and nonobstructed. There is punctate bilateral nephrolithiasis. Symmetric renal contrast excretion on the delayed images. Negative ureters and bladder. Stomach/Bowel: Redundant large bowel with mild retained stool throughout. Small volume of fluid in the right colon. No convincing large bowel inflammation. Normal appendix on series 3, image 55. Decompressed and negative terminal ileum. No dilated small bowel. However, there is pronounced mucosal hyperenhancement and inflammation throughout the duodenum. See series 3, images 29 through 41 and coronal image 49. Indistinct duodenal wall with inflammatory stranding. And the distal CBD also appears to be enhancing although is nondilated (coronal image 48). No regional free air or free fluid. Inflammatory stranding does involve the anterior right pararenal space. The stomach and ligament of Treitz have a more normal appearance. No other bowel inflammation identified. Vascular/Lymphatic: Mild Aortoiliac  calcified atherosclerosis. Major arterial structures in the abdomen and pelvis are patent. Portal venous system is patent. No lymphadenopathy. Reproductive: Negative. Other: No pelvic free fluid. Musculoskeletal: T12 superior endplate Schmorl's node is new since 2018. No acute osseous abnormality identified. IMPRESSION: 1. Moderate acute inflammation throughout the duodenum. Mild secondary inflammation of the distal CBD, and the right anterior pararenal space, but no bile duct enlargement. Top differential considerations are acute peptic ulcer disease and infectious duodenitis. No free air or free fluid. 2. No other acute or inflammatory process identified in the abdomen or pelvis. Normal appendix. 3. Punctate bilateral nephrolithiasis. Calcified coronary artery and Aortic Atherosclerosis (ICD10-I70.0). Electronically Signed   By: HGenevie AnnM.D.   On: 07/09/2021 04:37   DG Chest Portable 1 View  Result Date: 07/09/2021 CLINICAL DATA:  Abdominal pain, nausea and vomiting.  Tachycardia. EXAM: PORTABLE CHEST - 1 VIEW; ABDOMEN - 1 VIEW COMPARISON:  09/23/2018. FINDINGS: The heart and mediastinal silhouette are within normal limits. Lung volumes are low. No consolidation, effusion, or pneumothorax. There is a nonobstructive bowel gas pattern. No free air is identified. Surgical clips are present in the right upper quadrant. Degenerative changes are present in the thoracolumbar spine and bilateral hips. IMPRESSION: 1. No acute process in the chest. 2. No bowel obstruction or free air. Electronically Signed   By: LBrett FairyM.D.   On: 07/09/2021 00:42    IMPRESSION:  *55year old male presenting with complaints of nausea, vomiting/dry heaves and abdominal pain for 4 days.  CT scan showing possible duodenal ulcer versus infectious duodenitis.  Has been using ibuprofen, multiple per day.  Lactic acid okay. *DKA: Came in with blood sugar over 500.  Had not been taking his medications at home. *Leukocytosis with  white blood cell count of 17.6K *Acute kidney injury *Hyponatremia with a sodium of 123.  PLAN: *We will plan for EGD on 12/12. *PPI  drip has been by the admitting team.  We will continue that for now. *Antiemetics and pain control.  IV hydration. *Okay for clear liquids if patient desires, but then n.p.o. after midnight. *Monitor/trend labs.   Laban Emperor. Zehr  07/09/2021, 10:01 AM  I have taken an interval history, reviewed the chart and examined the patient. I agree with the Advanced Practitioner's note, impression and recommendations.  Majority the medical decision-making in the formulation of the assessment and plan were performed by me. I also personally reviewed the CT scan images. My additional thoughts are as follows:  Several days of escalating upper abdominal pain, nausea and vomiting.  In DKA, not compliant with diabetic treatment, hemoglobin A1c 11.5.  Significant electrolyte derangement, AKI. Regular NSAID use, CT scan suggest probable duodenal ulcer.  He is tender in the right upper quadrant without exam signs of perforation. No GI bleeding at present.  N.p.o. now, Protonix drip Primary team is itching volume resuscitation and glucose control. He is tentatively on the schedule for an upper endoscopy tomorrow.  He will need to be reevaluated in the morning to ensure better control of glucose and improvement in electrolyte derangement prior to undergoing sedation for the procedure.  Dr. Havery Moros will assume consult management of this patient tomorrow.   Nelida Meuse III Office:[phone]

## 2021-07-09 NOTE — Evaluation (Signed)
Physical Therapy Evaluation Patient Details Name: Martin Downs MRN: [id number] DOB: [birth year] Today's Date: 07/09/2021  History of Present Illness  Pt is a 55 y.o. male who presented 07/08/21 with x4 day hx of nausea and emesis. Pt had not taken his meds for 4 days and was having elevated blood sugars at home, per chart. Pt presented tachycardic with HR of 165. Pt admitted with DKA, Afib with RVR, and CT evidence of peptic ulcer disease and duodenitis. PMH: type 2 diabetes on insulin, chronic methadone user, hyperlipidemia   Clinical Impression  Pt presents with condition above and deficits mentioned below, see PT Problem List. PTA, he was independent without an AD, living in a 2-level house with his wife, who works. Pt's bedroom is on the 2nd floor, but he can sleep in the recliner on the main floor if needed. He has 3-4 STE his home. Pt's daughter, who is a PA, reports they will do "whatever needs to be done" to help him at d/c. Currently, pt displays deficits in upper and lower extremity strength (worse proximally), upper and lower extremity coordination, balance, activity tolerance, and cognition. He has very slow processing, STM deficits, and difficulty initiating, sequencing, and problem-solving at this time. Pt is tremulous and thus needs min guard assist when standing to ensure safety. He is at risk for falls. Pt is requiring minA for bed mobility. He was limited to only a few steps at EOB due to feeling lightheaded. After pt returned to supine for several minutes at the end of the session his HR rose to 140s-172, but it was low 100s when standing earlier. Recommend follow-up with HHPT at d/c at this time, but am hopeful that pt will progress back to his baseline quickly. Will continue to follow acutely.     Recommendations for follow up therapy are one component of a multi-disciplinary discharge planning process, led by the attending physician.  Recommendations may be updated based on  patient status, additional functional criteria and insurance authorization.  Follow Up Recommendations Home health PT (pending progression)    Assistance Recommended at Discharge Frequent or constant Supervision/Assistance  Functional Status Assessment Patient has had a recent decline in their functional status and demonstrates the ability to make significant improvements in function in a reasonable and predictable amount of time.  Equipment Recommendations  Rollator (4 wheels);BSC/3in1 (pending progression)    Recommendations for Other Services OT consult     Precautions / Restrictions Precautions Precautions: Fall;Other (comment) Precaution Comments: monitor HR and BP Restrictions Weight Bearing Restrictions: No      Mobility  Bed Mobility Overal bed mobility: Needs Assistance Bed Mobility: Supine to Sit;Sit to Supine     Supine to sit: Min assist Sit to supine: Min assist   General bed mobility comments: Pt requesting PT's hand to pull on to ascend trunk and scoot to edge of stretcher. MinA to manage legs with return to supine, cuing to lean laterally onto elbow and swing legs onto bed.    Transfers Overall transfer level: Needs assistance Equipment used: None Transfers: Sit to/from Stand Sit to Stand: Min guard;From elevated surface           General transfer comment: Min guard for safety coming to stand from stretcher. Pt mildly unsteady and tremulous, slow to rise.    Ambulation/Gait Ambulation/Gait assistance: Min guard Gait Distance (Feet): 6 Feet Assistive device: None;1 person hand held assist Gait Pattern/deviations: Step-to pattern;Decreased stride length;Shuffle Gait velocity: reduced Gait velocity interpretation: <1.31 ft/sec, indicative of  household ambulator   General Gait Details: Pt with very slow gait, having difficulty lifting either leg and advancing it, cuing pt to increase step length with min success. Pt intermittently placing L hand on  stretcher for support, no LOB but mildly unsteady and tremulous, min guard for safety. Pt became lightheaded thus returned him to sit.  Stairs            Wheelchair Mobility    Modified Rankin (Stroke Patients Only)       Balance Overall balance assessment: Mild deficits observed, not formally tested                                           Pertinent Vitals/Pain Pain Assessment: Faces Faces Pain Scale: Hurts a little bit Pain Location: generalized grimacing with mobility Pain Descriptors / Indicators: Grimacing Pain Intervention(s): Limited activity within patient's tolerance;Monitored during session;Repositioned    Home Living Family/patient expects to be discharged to:: Private residence Living Arrangements: Spouse/significant other Available Help at Discharge: Family;Available 24 hours/day (initially but wife does work) Type of Home: House Home Access: Stairs to enter Entrance Stairs-Rails: None Technical brewer of Steps: 3-4 Alternate Level Stairs-Number of Steps: flight Home Layout: Two level;Able to live on main level with bedroom/bathroom;Bed/bath upstairs (can sleep in recliner on main level) Home Equipment: None Additional Comments: Daughter is a PA    Prior Function Prior Level of Function : Independent/Modified Independent;Driving             Mobility Comments: Does not work. Does not use an AD       Hand Dominance   Dominant Hand: Right    Extremity/Trunk Assessment   Upper Extremity Assessment Upper Extremity Assessment: RUE deficits/detail;LUE deficits/detail;Generalized weakness RUE Deficits / Details: MMT scores of 4- shoulder flexion, 4+ elbow flexion; denies numbness/tingling; noted incoordination with tremulous movements noted; difficulty pronating and supinating forearm and combining wrist motion to feed self RUE Coordination: decreased gross motor;decreased fine motor LUE Deficits / Details: MMT scores of 4-  shoulder flexion, 4+ elbow flexion; denies numbness/tingling; noted incoordination with tremulous movements noted LUE Coordination: decreased gross motor;decreased fine motor    Lower Extremity Assessment Lower Extremity Assessment: Generalized weakness;RLE deficits/detail;LLE deficits/detail RLE Deficits / Details: MMT scores of 3+ hip flexion, 4+ knee extension; tremulous movements noted with gross incoordination; denies numbness/tingling RLE Coordination: decreased gross motor LLE Deficits / Details: MMT scores of 3- hip flexion, 4+ knee extension; tremulous movements noted with gross incoordination; denies numbness/tingling LLE Coordination: decreased gross motor    Cervical / Trunk Assessment Cervical / Trunk Assessment: Normal  Communication   Communication: No difficulties  Cognition Arousal/Alertness: Lethargic Behavior During Therapy: Flat affect Overall Cognitive Status: Impaired/Different from baseline Area of Impairment: Attention;Memory;Following commands;Safety/judgement;Awareness;Problem solving                   Current Attention Level: Focused Memory: Decreased short-term memory Following Commands: Follows one step commands with increased time;Follows multi-step commands inconsistently Safety/Judgement: Decreased awareness of safety;Decreased awareness of deficits Awareness: Emergent Problem Solving: Slow processing;Decreased initiation;Difficulty sequencing;Requires verbal cues General Comments: Pt with eyes closed upon arrival, but easily arousable. However, pt still appeared lethargic with flat affect throughout session. Pt with very delayed processing, losing attention and asking PT to repeat cues often. Pt with STM deficits, forgetting being told multiple times where to go or what to do.  General Comments General comments (skin integrity, edema, etc.): HR 70-80s supine at rest, low 100s with standing and gait, rose up to 140-172 after being supine  ~3 min end of session with HR varying greatly, RN notified    Exercises     Assessment/Plan    PT Assessment Patient needs continued PT services  PT Problem List Decreased strength;Decreased activity tolerance;Decreased balance;Decreased mobility;Decreased coordination;Decreased cognition;Decreased safety awareness;Cardiopulmonary status limiting activity       PT Treatment Interventions DME instruction;Gait training;Stair training;Functional mobility training;Therapeutic activities;Therapeutic exercise;Balance training;Neuromuscular re-education;Cognitive remediation;Patient/family education    PT Goals (Current goals can be found in the Care Plan section)  Acute Rehab PT Goals Patient Stated Goal: to get better PT Goal Formulation: With patient/family Time For Goal Achievement: 07/23/21 Potential to Achieve Goals: Good    Frequency Min 3X/week   Barriers to discharge        Co-evaluation               AM-PAC PT "6 Clicks" Mobility  Outcome Measure Help needed turning from your back to your side while in a flat bed without using bedrails?: A Little Help needed moving from lying on your back to sitting on the side of a flat bed without using bedrails?: A Little Help needed moving to and from a bed to a chair (including a wheelchair)?: A Little Help needed standing up from a chair using your arms (e.g., wheelchair or bedside chair)?: A Little Help needed to walk in hospital room?: Total Help needed climbing 3-5 steps with a railing? : Total 6 Click Score: 14    End of Session Equipment Utilized During Treatment: Gait belt Activity Tolerance: Patient tolerated treatment well;Other (comment) (limited by getting lightheaded, elevated HR) Patient left: in bed;with call Downs/phone within reach;with family/visitor present Nurse Communication: Mobility status;Other (comment) (HR) PT Visit Diagnosis: Unsteadiness on feet (R26.81);Other abnormalities of gait and mobility  (R26.89);Muscle weakness (generalized) (M62.81);Difficulty in walking, not elsewhere classified (R26.2)    Time: 1405-1430 PT Time Calculation (min) (ACUTE ONLY): 25 min   Charges:   PT Evaluation $PT Eval Moderate Complexity: 1 Mod PT Treatments $Therapeutic Activity: 8-22 mins        Moishe Spice, PT, DPT Acute Rehabilitation Services  Pager: [phone] Office: [phone]  Orvan Falconer 07/09/2021, 3:35 PM

## 2021-07-09 NOTE — Subjective & Objective (Signed)
CC: N/V for 4 days HPI: 55 year old male with a history of type 2 diabetes on insulin, chronic methadone user, hyperlipidemia presents to the ER today with 4-day history of nausea and vomiting.  He has had 4 days of dry heaves.  He is only had 1-2 episodes of actual vomit.  Patient did not take his meds for 4 straight days.  Has had elevated blood sugars at home.  Patient states he did not take his insulin because his sugars are high.  His wife is in disbelief of the patient's noncompliance.  Patient presented to the ER tachycardic with a heart rate of 165.  Labs consistent with DKA including serum glucose of 539, bicarbonate of 11, beta hydroxybutyrate of 6.6.  Venous pH is 7.16 PCO2 34 PO2 39  Chest x-ray is negative.  KUB is negative.  White count 17.6.  During the interview, the patient went into a irregular fast heart rhythm.  On bedside telemetry appears to be rapid A. fib.  EKG to confirm A. fib is pending.  Patient started on insulin drip.  Due to continued abdominal pain, EDP is ordering a CT scan of the abdomen.  [facility] hospitalist contacted for admission due to DKA.

## 2021-07-09 NOTE — ED Notes (Signed)
Pts HR noted to 160 with PT, provider notified.

## 2021-07-09 NOTE — ED Notes (Signed)
Pt now calm, heaving has subsided, converted to NSR. Pain decreased. MD aware

## 2021-07-09 NOTE — Assessment & Plan Note (Addendum)
Likely due to the patient's acidosis.  Start Cardizem drip.  Hold off on anticoagulation for now.  CHAD-VASC score of 1

## 2021-07-09 NOTE — ED Provider Notes (Signed)
[facility] EMERGENCY DEPARTMENT Provider Note  CSN: [id number] Arrival date & time: 07/08/21 2206  Chief Complaint(s) Emesis  HPI Martin Downs is a 55 y.o. male with a past medical history listed below including insulin-dependent diabetes noncompliant with medications, history of chronic pain on methadone (to which she is compliant with) here for several days of nausea and nonbloody nonbilious emesis.  Patient believes he is in diabetic crisis.  Reports that he has not had much food intake but has been trying to hydrate.  Has been able to keep his methadone medicine down.  Endorses generalized abdominal discomfort over the last 3 days described as a cramping sensation that is fluctuating in nature.  Exacerbated with emesis.  No alleviating factors.  He reports a very small bowel movement today.  Prior to that no bowel movements in the past several days.  No known fevers or infections.  No cough or congestion.  Reports that he has not had any insulin in several days (maybe sometime last week).  The history is provided by the patient and the spouse.   Past Medical History Past Medical History:  Diagnosis Date   Anxiety    Borderline diabetes    Chronic pain    Depression    Diabetes mellitus without complication ([place])    Hyperlipidemia    Narcotic dependence ([place])    Sleep apnea    no cpap   Patient Active Problem List   Diagnosis Date Noted   DKA, type 2 ([place]) 07/09/2021   Atrial fibrillation with RVR ([place]) 07/09/2021   Angular cheilitis 12/12/2020   Allergic rhinitis 09/22/2019   Complex sleep apnea syndrome 09/03/2018   Heart palpitations 06/23/2018   Unexplained night sweats 06/23/2018   Sleep apnea 06/23/2018   Hypersomnia, persistent 06/23/2018   Paronychia of thumb 63/84/6659   Umbilical hernia 93/57/0177   Acute gangrenous cholecystitis s/p cholecystectomy 05/30/2016 05/31/2016   Common bile duct repair with T-Tube 11/192017 05/31/2016    Cholecystitis 05/30/2016   Erectile dysfunction 03/13/2016   Premature ejaculation 03/13/2016   Hypogonadotropic hypogonadism ([place]) 05/13/2015   Hyperprolactinemia ([place]) 05/13/2015   Elevated liver function tests 06/28/2014   Uncontrolled type 2 diabetes mellitus with nephropathy 05/31/2014   Family history of premature CAD 02/11/2014   DOE (dyspnea on exertion) 02/11/2014   Anxiety 11/05/2011   Major depressive disorder, recurrent ([place]) 08/23/2011   Chronic pain 06/27/2011   Long-term current use of methadone for opiate dependence ([place]) 11/16/2010   Generalized anxiety disorder 07/26/2010   Hypothyroidism 01/17/2010   Oral herpes simplex infection 11/03/2009   Pure hypercholesterolemia 05/26/2009   Fatigue 03/30/2009   TOBACCO ABUSE 12/30/2008   HLD (hyperlipidemia) 12/04/2006   GOUT 12/04/2006   INSOMNIA 12/04/2006   Home Medication(s) Prior to Admission medications   Medication Sig Start Date End Date Taking? Authorizing Provider  aspirin 81 MG tablet Take 81 mg by mouth daily.    [provider]  BD DISP NEEDLES 18G X 1-1/2" MISC USE TO DRAW UP TESTOSTERONE 08/26/19   Philemon Kingdom, MD  BD INSULIN SYRINGE U/F 31G X 5/16" 0.5 ML MISC USE 3 TIMES DAILY 09/19/17   Philemon Kingdom, MD  BD PEN NEEDLE NANO 2ND GEN 32G X 4 MM MISC USE TO INJECT INSULIN 1 TIME DAILY AS INSTRUCTED. 01/12/21   Philemon Kingdom, MD  Blood Glucose Monitoring Suppl (ONE TOUCH ULTRA MINI) W/DEVICE KIT Use as directed to test blood sugar twice daily 250.00 04/29/14   Lucille Passy, MD  clotrimazole (LOTRIMIN) 1 % cream Apply 1 application topically 2 (two) times daily. 12/12/20   Lesleigh Noe, MD  Continuous Blood Gluc Receiver (FREESTYLE LIBRE 2 READER) DEVI 1 each by Does not apply route daily. 11/21/20   Philemon Kingdom, MD  Continuous Blood Gluc Sensor (FREESTYLE LIBRE 2 SENSOR) MISC 1 each by Does not apply route every 14 (fourteen) days. 11/21/20   Philemon Kingdom, MD  divalproex  (DEPAKOTE ER) 500 MG 24 hr tablet Take by mouth. 10/13/20   [provider]  empagliflozin (JARDIANCE) 25 MG TABS tablet Take 1 tablet (25 mg total) by mouth daily. 11/21/20   Philemon Kingdom, MD  escitalopram (LEXAPRO) 20 MG tablet Take 1 tablet (20 mg total) by mouth daily. 12/12/20   Lesleigh Noe, MD  fenofibrate 160 MG tablet Take 1 tablet (160 mg total) by mouth daily. 02/25/20   Lesleigh Noe, MD  fluticasone [facility]) 50 MCG/ACT nasal spray INSTILL 2 SPRAYS INTO EACH NOSTRIL EVERY DAY 09/22/19   Lesleigh Noe, MD  glucose blood [facility] VERIO) test strip Use to test blood sugar 4 times daily-Dx code E11.65 03/31/20   Philemon Kingdom, MD  hydrOXYzine (VISTARIL) 25 MG capsule Take 25 mg by mouth 2 (two) times daily. 10/24/20   [provider]  insulin glargine (LANTUS SOLOSTAR) 100 UNIT/ML Solostar Pen Inject 60-80 units at bedtime 01/26/21   Philemon Kingdom, MD  insulin lispro (HUMALOG KWIKPEN) 200 UNIT/ML KwikPen Inject 10-14 units before the 3 meals of the day 11/21/20   Philemon Kingdom, MD  Insulin Syringe-Needle U-100 (B-D INSULIN SYRINGE 1CC/25GX1") 25G X 1" 1 ML MISC USE TO INJECT TESTOSTERONE WEEKLY 08/26/19   Philemon Kingdom, MD  metFORMIN (GLUCOPHAGE) 1000 MG tablet Take 1 tablet 2x a day 11/21/20   Philemon Kingdom, MD  methadone (DOLOPHINE) 10 MG/ML solution Take 180 mg by mouth daily.     [provider]  Multiple Vitamin (MULTIVITAMIN) tablet Take 2 tablets by mouth 2 (two) times daily.    [provider]  OneTouch Delica Lancets 43H MISC USE TO TEST BLOOD SUGAR 4 TIMES DAILY-DX code E11.65 03/31/20   Philemon Kingdom, MD  OZEMPIC, 1 MG/DOSE, 4 MG/3ML SOPN INJECT 1MG INTO THE SKIN ONCE A WEEK 03/20/21   Philemon Kingdom, MD  risperiDONE (RISPERDAL) 0.5 MG tablet Take 0.5 mg by mouth 2 (two) times daily. 10/19/20   [provider]  rosuvastatin (CRESTOR) 10 MG tablet TAKE 1 TABLET BY MOUTH EVERY DAY 03/13/21   Lesleigh Noe, MD   SYRINGE-NEEDLE, DISP, 3 ML (B-D 3CC LUER-LOK SYR 18GX1-1/2) 18G X 1-1/2" 3 ML MISC USE TO DRAW UP TESTOSTERONE 11/14/18   Philemon Kingdom, MD  Syringe/Needle, Disp, 18G X 1" 1 ML MISC Use to draw up testosterone 03/04/17   Philemon Kingdom, MD  testosterone cypionate (DEPOTESTOSTERONE CYPIONATE) 200 MG/ML injection INJECT 0.25MLS INTO THE MUSCLE ONCE WEEKLY 03/21/18   Philemon Kingdom, MD  traZODone (DESYREL) 50 MG tablet Take 50 mg by mouth at bedtime. 10/26/20   [provider]  insulin aspart (NOVOLOG FLEXPEN) 100 UNIT/ML FlexPen Inject 10-15 Units into the skin 3 (three) times daily with meals. 01/23/19 11/23/19  Philemon Kingdom, MD  Past Surgical History Past Surgical History:  Procedure Laterality Date   CHOLECYSTECTOMY N/A 05/30/2016   Procedure: LAPAROSCOPIC CHOLECYSTECTOMY WITH INTRAOPERATIVE CHOLANGIOGRAM;  Surgeon: Donnie Mesa, MD;  Location: [facility];  Service: General;  Laterality: N/A;   CHOLECYSTECTOMY N/A 05/30/2016   Procedure: OPEN COMMON BILE DUCT EXPLORATION; INSERTION OF T-TUBE;  Surgeon: Donnie Mesa, MD;  Location: [place] [facility];  Service: General;  Laterality: N/A;   removal of bone spur Bilateral 1995   elbps   TOOTH EXTRACTION  5631   UMBILICAL HERNIA REPAIR N/A 05/30/2016   Procedure: HERNIA REPAIR UMBILICAL ADULT;  Surgeon: Donnie Mesa, MD;  Location: [place];  Service: General;  Laterality: N/A;   Family History Family History  Problem Relation Age of Onset   Hypertension Mother    Cancer Father        not sure of type   Diabetes Father    Hypertension Father    Heart attack Father 85   Diabetes Brother    Hypertension Brother    Heart attack Brother        around 30 years of age   Kidney cancer Brother        stage 4-1 kidney removed   Colon cancer Neg Hx     Social History Social History   Tobacco Use   Smoking status:  Never   Smokeless tobacco: Former    Types: Snuff    Quit date: 03/30/2020  Vaping Use   Vaping Use: Never used  Substance Use Topics   Alcohol use: No    Comment: quit 2009   Drug use: Not Currently   Allergies Gemfibrozil  Review of Systems Review of Systems All other systems are reviewed and are negative for acute change except as noted in the HPI  Physical Exam Vital Signs  I have reviewed the triage vital signs BP 108/88 (BP Location: Left Arm)   Pulse 168   Temp 98.4 F (36.9 C) (Oral)   Resp 20   SpO2 100%   Physical Exam Vitals reviewed.  Constitutional:      General: He is not in acute distress.    Appearance: He is well-developed. He is ill-appearing. He is not diaphoretic.  HENT:     Head: Normocephalic and atraumatic.     Nose: Nose normal.  Eyes:     General: No scleral icterus.       Right eye: No discharge.        Left eye: No discharge.     Conjunctiva/sclera: Conjunctivae normal.     Pupils: Pupils are equal, round, and reactive to light.  Cardiovascular:     Rate and Rhythm: Regular rhythm. Tachycardia present.     Heart sounds: No murmur heard.   No friction rub. No gallop.  Pulmonary:     Effort: Pulmonary effort is normal. No respiratory distress.     Breath sounds: Normal breath sounds. No stridor. No rales.  Abdominal:     General: There is no distension.     Palpations: Abdomen is soft.     Tenderness: There is generalized abdominal tenderness. There is no guarding or rebound.  Musculoskeletal:        General: No tenderness.     Cervical back: Normal range of motion and neck supple.  Skin:    General: Skin is warm and dry.     Findings: No erythema or rash.  Neurological:     Mental Status: He is alert and oriented to person, place, and time.  ED Results and Treatments Labs (all labs ordered are listed, but only abnormal results are displayed) Labs Reviewed  COMPREHENSIVE METABOLIC PANEL - Abnormal; Notable for the following  components:      Result Value   Sodium 123 (*)    Chloride 91 (*)    CO2 11 (*)    Glucose, Bld 539 (*)    BUN 37 (*)    Creatinine, Ser 1.62 (*)    Calcium 8.2 (*)    AST 14 (*)    Total Bilirubin 1.7 (*)    GFR, Estimated 49 (*)    Anion gap 21 (*)    All other components within normal limits  CBC WITH DIFFERENTIAL/PLATELET - Abnormal; Notable for the following components:   WBC 17.6 (*)    RBC 6.12 (*)    Hemoglobin 18.2 (*)    Platelets 403 (*)    Neutro Abs 15.1 (*)    Monocytes Absolute 1.5 (*)    Abs Immature Granulocytes 0.14 (*)    All other components within normal limits  LIPASE, BLOOD - Abnormal; Notable for the following components:   Lipase 62 (*)    All other components within normal limits  URINALYSIS, ROUTINE W REFLEX MICROSCOPIC - Abnormal; Notable for the following components:   Color, Urine STRAW (*)    Glucose, UA >=500 (*)    Ketones, ur 80 (*)    All other components within normal limits  BETA-HYDROXYBUTYRIC ACID - Abnormal; Notable for the following components:   Beta-Hydroxybutyric Acid 6.64 (*)    All other components within normal limits  MAGNESIUM - Abnormal; Notable for the following components:   Magnesium 2.6 (*)    All other components within normal limits  CBG MONITORING, ED - Abnormal; Notable for the following components:   Glucose-Capillary 480 (*)    All other components within normal limits  I-STAT VENOUS BLOOD GAS, ED - Abnormal; Notable for the following components:   pH, Ven 7.165 (*)    pCO2, Ven 34.0 (*)    Bicarbonate 12.3 (*)    TCO2 13 (*)    Acid-base deficit 15.0 (*)    Sodium 122 (*)    Potassium 5.3 (*)    Calcium, Ion 1.11 (*)    HCT 54.0 (*)    Hemoglobin 18.4 (*)    All other components within normal limits  CBG MONITORING, ED - Abnormal; Notable for the following components:   Glucose-Capillary 406 (*)    All other components within normal limits  CBG MONITORING, ED - Abnormal; Notable for the following  components:   Glucose-Capillary 319 (*)    All other components within normal limits  RESP PANEL BY RT-PCR (FLU A&B, COVID) ARPGX2  LACTIC ACID, PLASMA  LACTIC ACID, PLASMA  RAPID URINE DRUG SCREEN, HOSP PERFORMED  I-STAT CHEM 8, ED  EKG  EKG Interpretation  Date/Time:  Saturday July 08 2021 23:34:00 EST Ventricular Rate:  176 PR Interval:    QRS Duration: 86 QT Interval:  286 QTC Calculation: 489 R Axis:   241 Text Interpretation: Supraventricular tachycardia Right superior axis deviation Septal infarct , age undetermined Abnormal ECG Confirmed by Addison Lank [phone]) on 07/08/2021 11:50:26 PM       Radiology DG Abdomen 1 View  Result Date: 07/09/2021 CLINICAL DATA:  Abdominal pain, nausea and vomiting.  Tachycardia. EXAM: PORTABLE CHEST - 1 VIEW; ABDOMEN - 1 VIEW COMPARISON:  09/23/2018. FINDINGS: The heart and mediastinal silhouette are within normal limits. Lung volumes are low. No consolidation, effusion, or pneumothorax. There is a nonobstructive bowel gas pattern. No free air is identified. Surgical clips are present in the right upper quadrant. Degenerative changes are present in the thoracolumbar spine and bilateral hips. IMPRESSION: 1. No acute process in the chest. 2. No bowel obstruction or free air. Electronically Signed   By: Brett Fairy M.D.   On: 07/09/2021 00:42   DG Chest Portable 1 View  Result Date: 07/09/2021 CLINICAL DATA:  Abdominal pain, nausea and vomiting.  Tachycardia. EXAM: PORTABLE CHEST - 1 VIEW; ABDOMEN - 1 VIEW COMPARISON:  09/23/2018. FINDINGS: The heart and mediastinal silhouette are within normal limits. Lung volumes are low. No consolidation, effusion, or pneumothorax. There is a nonobstructive bowel gas pattern. No free air is identified. Surgical clips are present in the right upper quadrant. Degenerative changes are  present in the thoracolumbar spine and bilateral hips. IMPRESSION: 1. No acute process in the chest. 2. No bowel obstruction or free air. Electronically Signed   By: Brett Fairy M.D.   On: 07/09/2021 00:42    Pertinent labs & imaging results that were available during my care of the patient were reviewed by me and considered in my medical decision making (see MDM for details).  Medications Ordered in ED Medications  sodium chloride 0.9 % bolus 1,000 mL (0 mLs Intravenous Stopped 07/09/21 0129)    Followed by  sodium chloride 0.9 % bolus 1,000 mL (0 mLs Intravenous Stopped 07/09/21 0129)    Followed by  0.9 %  sodium chloride infusion (1,000 mLs Intravenous New Bag/Given 07/09/21 0129)  insulin regular, human (MYXREDLIN) 100 units/ 100 mL infusion (19 Units/hr Intravenous New Bag/Given 07/09/21 0128)  dextrose 5 % in lactated ringers infusion (has no administration in time range)  dextrose 50 % solution 0-50 mL (has no administration in time range)  fentaNYL (SUBLIMAZE) 100 MCG/2ML injection (has no administration in time range)  diltiazem (CARDIZEM) 1 mg/mL load via infusion 15 mg (has no administration in time range)  diltiazem (CARDIZEM) 125 mg in dextrose 5% 125 mL (1 mg/mL) infusion (has no administration in time range)  metoCLOPramide (REGLAN) injection 10 mg (has no administration in time range)  hyoscyamine (LEVSIN) tablet 0.125 mg (has no administration in time range)  sodium chloride 0.9 % bolus 1,000 mL (has no administration in time range)  metoCLOPramide (REGLAN) injection 10 mg (10 mg Intravenous Given 07/09/21 0027)  fentaNYL (SUBLIMAZE) injection 50 mcg (50 mcg Intravenous Given 07/09/21 0137)  Procedures .1-3 Lead EKG Interpretation Performed by: Fatima Blank, MD Authorized by: Fatima Blank, MD     Interpretation:  abnormal     ECG rate:  168   ECG rate assessment: tachycardic     Rhythm: sinus tachycardia     Ectopy: none     Conduction: normal   .Critical Care Performed by: Fatima Blank, MD Authorized by: Fatima Blank, MD   Critical care provider statement:    Critical care time (minutes):  45   Critical care time was exclusive of:  Separately billable procedures and treating other patients   Critical care was necessary to treat or prevent imminent or life-threatening deterioration of the following conditions:  Metabolic crisis   Critical care was time spent personally by me on the following activities:  Development of treatment plan with patient or surrogate, discussions with consultants, evaluation of patient's response to treatment, examination of patient, obtaining history from patient or surrogate, review of old charts, re-evaluation of patient's condition, pulse oximetry, ordering and review of radiographic studies, ordering and review of laboratory studies and ordering and performing treatments and interventions  (including critical care time)  Medical Decision Making / ED Course I have reviewed the nursing notes for this encounter and the patient's prior records (if available in EHR or on provided paperwork).  Martin Downs was evaluated in Emergency Department on 07/09/2021 for the symptoms described in the history of present illness. He was evaluated in the context of the global COVID-19 pandemic, which necessitated consideration that the patient might be at risk for infection with the SARS-CoV-2 virus that causes COVID-19. Institutional protocols and algorithms that pertain to the evaluation of patients at risk for COVID-19 are in a state of rapid change based on information released by regulatory bodies including the CDC and federal and state organizations. These policies and algorithms were followed during the patient's care in the ED.    Work-up consistent with  DKA. Patient given 3 L of IV fluid boluses and put on maintenance fluid. Insulin drip. Tachycardia improved after IV fluids. Noted to be in A. fib with rates between the 120s and 140s -likely secondary to DKA.   Ordering CT of abd/pelvis to assess for infectious process. Admitted to medicine  Pertinent labs & imaging results that were available during my care of the patient were reviewed by me and considered in my medical decision making:    Final Clinical Impression(s) / ED Diagnoses Final diagnoses:  Secondary DM with DKA, uncontrolled ([place])     This chart was dictated using voice recognition software.  Despite best efforts to proofread,  errors can occur which can change the documentation meaning.    Fatima Blank, MD 07/09/21 [phone]

## 2021-07-09 NOTE — Assessment & Plan Note (Signed)
Admit to progressive care. DKA protocol.

## 2021-07-10 ENCOUNTER — Encounter (HOSPITAL_COMMUNITY)
Admission: EM | Disposition: A | Discharge status: Home / Self Care | Payer: Self-pay | Source: Home / Self Care | Attending: Internal Medicine

## 2021-07-10 ENCOUNTER — Inpatient Hospital Stay (HOSPITAL_COMMUNITY): Payer: 59 | Admitting: Anesthesiology

## 2021-07-10 ENCOUNTER — Encounter (HOSPITAL_COMMUNITY): Payer: Self-pay | Admitting: Internal Medicine

## 2021-07-10 ENCOUNTER — Inpatient Hospital Stay (HOSPITAL_COMMUNITY): Payer: 59

## 2021-07-10 DIAGNOSIS — K259 Gastric ulcer, unspecified as acute or chronic, without hemorrhage or perforation: Secondary | ICD-10-CM

## 2021-07-10 DIAGNOSIS — K269 Duodenal ulcer, unspecified as acute or chronic, without hemorrhage or perforation: Secondary | ICD-10-CM

## 2021-07-10 DIAGNOSIS — I4891 Unspecified atrial fibrillation: Secondary | ICD-10-CM

## 2021-07-10 DIAGNOSIS — K253 Acute gastric ulcer without hemorrhage or perforation: Secondary | ICD-10-CM

## 2021-07-10 DIAGNOSIS — K209 Esophagitis, unspecified without bleeding: Secondary | ICD-10-CM

## 2021-07-10 DIAGNOSIS — R1010 Upper abdominal pain, unspecified: Secondary | ICD-10-CM

## 2021-07-10 DIAGNOSIS — R9389 Abnormal findings on diagnostic imaging of other specified body structures: Secondary | ICD-10-CM

## 2021-07-10 DIAGNOSIS — K2970 Gastritis, unspecified, without bleeding: Secondary | ICD-10-CM

## 2021-07-10 HISTORY — PX: BIOPSY: SHX5522

## 2021-07-10 HISTORY — PX: ESOPHAGOGASTRODUODENOSCOPY (EGD) WITH PROPOFOL: SHX5813

## 2021-07-10 LAB — COMPREHENSIVE METABOLIC PANEL
ALT: 17 U/L (ref 0–44)
ALT: 11 U/L (ref 0–44)
AST: 14 U/L — ABNORMAL LOW (ref 15–41)
AST: 11 U/L — ABNORMAL LOW (ref 15–41)
Albumin: 3.8 g/dL (ref 3.5–5.0)
Albumin: 2.5 g/dL — ABNORMAL LOW (ref 3.5–5.0)
Alkaline Phosphatase: 65 U/L (ref 38–126)
Alkaline Phosphatase: 37 U/L — ABNORMAL LOW (ref 38–126)
Anion gap: 21 — ABNORMAL HIGH (ref 5–15)
Anion gap: 9 (ref 5–15)
BUN: 37 mg/dL — ABNORMAL HIGH (ref 6–20)
BUN: 11 mg/dL (ref 6–20)
CO2: 11 mmol/L — ABNORMAL LOW (ref 22–32)
CO2: 23 mmol/L (ref 22–32)
Calcium: 8.2 mg/dL — ABNORMAL LOW (ref 8.9–10.3)
Calcium: 7.6 mg/dL — ABNORMAL LOW (ref 8.9–10.3)
Chloride: 91 mmol/L — ABNORMAL LOW (ref 98–111)
Chloride: 99 mmol/L (ref 98–111)
Creatinine, Ser: 1.62 mg/dL — ABNORMAL HIGH (ref 0.61–1.24)
Creatinine, Ser: 0.87 mg/dL (ref 0.61–1.24)
GFR, Estimated: 49 mL/min — ABNORMAL LOW (ref >60)
GFR, Estimated: >60 mL/min (ref >60)
Glucose, Bld: 539 mg/dL (ref 70–99)
Glucose, Bld: 167 mg/dL — ABNORMAL HIGH (ref 70–99)
Potassium: 5.1 mmol/L (ref 3.5–5.1)
Potassium: 3.4 mmol/L — ABNORMAL LOW (ref 3.5–5.1)
Sodium: 123 mmol/L — ABNORMAL LOW (ref 135–145)
Sodium: 131 mmol/L — ABNORMAL LOW (ref 135–145)
Total Bilirubin: 1.7 mg/dL — ABNORMAL HIGH (ref 0.3–1.2)
Total Bilirubin: 1 mg/dL (ref 0.3–1.2)
Total Protein: 6.7 g/dL (ref 6.5–8.1)
Total Protein: 4.3 g/dL — ABNORMAL LOW (ref 6.5–8.1)

## 2021-07-10 LAB — CBC WITH DIFFERENTIAL/PLATELET
Abs Immature Granulocytes: 0.04 10*3/uL (ref 0.00–0.07)
Basophils Absolute: 0 10*3/uL (ref 0.0–0.1)
Basophils Relative: 0 %
Eosinophils Absolute: 0.1 10*3/uL (ref 0.0–0.5)
Eosinophils Relative: 1 %
HCT: 37.3 % — ABNORMAL LOW (ref 39.0–52.0)
Hemoglobin: 13.2 g/dL (ref 13.0–17.0)
Immature Granulocytes: 1 %
Lymphocytes Relative: 19 %
Lymphs Abs: 1.5 10*3/uL (ref 0.7–4.0)
MCH: 29.4 pg (ref 26.0–34.0)
MCHC: 35.4 g/dL (ref 30.0–36.0)
MCV: 83.1 fL (ref 80.0–100.0)
Monocytes Absolute: 0.9 10*3/uL (ref 0.1–1.0)
Monocytes Relative: 12 %
Neutro Abs: 5.3 10*3/uL (ref 1.7–7.7)
Neutrophils Relative %: 67 %
Platelets: 199 10*3/uL (ref 150–400)
RBC: 4.49 MIL/uL (ref 4.22–5.81)
RDW: 12.7 % (ref 11.5–15.5)
WBC: 7.9 10*3/uL (ref 4.0–10.5)
nRBC: 0 % (ref 0.0–0.2)

## 2021-07-10 LAB — BASIC METABOLIC PANEL
Anion gap: 9 (ref 5–15)
BUN: 12 mg/dL (ref 6–20)
CO2: 26 mmol/L (ref 22–32)
Calcium: 7.8 mg/dL — ABNORMAL LOW (ref 8.9–10.3)
Chloride: 99 mmol/L (ref 98–111)
Creatinine, Ser: 0.76 mg/dL (ref 0.61–1.24)
GFR, Estimated: >60 mL/min (ref >60)
Glucose, Bld: 134 mg/dL — ABNORMAL HIGH (ref 70–99)
Potassium: 3.6 mmol/L (ref 3.5–5.1)
Sodium: 134 mmol/L — ABNORMAL LOW (ref 135–145)

## 2021-07-10 LAB — ECHOCARDIOGRAM COMPLETE
Area-P 1/2: 5.13 cm2
Calc EF: 65 %
Height: 69 in
S' Lateral: 3.1 cm
Single Plane A2C EF: 68.9 %
Single Plane A4C EF: 63 %
Weight: 3040 oz

## 2021-07-10 LAB — MAGNESIUM: Magnesium: 2.2 mg/dL (ref 1.7–2.4)

## 2021-07-10 LAB — CBG MONITORING, ED
Glucose-Capillary: 161 mg/dL — ABNORMAL HIGH (ref 70–99)
Glucose-Capillary: 111 mg/dL — ABNORMAL HIGH (ref 70–99)
Glucose-Capillary: 136 mg/dL — ABNORMAL HIGH (ref 70–99)

## 2021-07-10 LAB — GLUCOSE, CAPILLARY
Glucose-Capillary: 122 mg/dL — ABNORMAL HIGH (ref 70–99)
Glucose-Capillary: 191 mg/dL — ABNORMAL HIGH (ref 70–99)

## 2021-07-10 LAB — TSH: TSH: 0.606 u[IU]/mL (ref 0.350–4.500)

## 2021-07-10 SURGERY — ESOPHAGOGASTRODUODENOSCOPY (EGD) WITH PROPOFOL
Anesthesia: Monitor Anesthesia Care

## 2021-07-10 MED ORDER — INSULIN GLARGINE-YFGN 100 UNIT/ML ~~LOC~~ SOLN
35.0000 [IU] | Freq: Two times a day (BID) | SUBCUTANEOUS | Status: DC
Start: 2021-07-10 — End: 2021-07-11
  Administered 2021-07-11: 35 [IU] via SUBCUTANEOUS
  Filled 2021-07-10 (×5): qty 0.35

## 2021-07-10 MED ORDER — POTASSIUM CHLORIDE 2 MEQ/ML IV SOLN
INTRAVENOUS | Status: DC
  Filled 2021-07-10 (×3): qty 1000

## 2021-07-10 MED ORDER — INSULIN GLARGINE-YFGN 100 UNIT/ML ~~LOC~~ SOLN
20.0000 [IU] | Freq: Once | SUBCUTANEOUS | Status: AC
  Administered 2021-07-10: 20 [IU] via SUBCUTANEOUS
  Filled 2021-07-10: qty 0.2

## 2021-07-10 MED ORDER — PROPOFOL 500 MG/50ML IV EMUL
INTRAVENOUS | Status: DC | PRN
  Administered 2021-07-10: 150 ug/kg/min via INTRAVENOUS

## 2021-07-10 MED ORDER — LIDOCAINE 2% (20 MG/ML) 5 ML SYRINGE
INTRAMUSCULAR | Status: DC | PRN
  Administered 2021-07-10: 40 mg via INTRAVENOUS

## 2021-07-10 MED ORDER — PROPOFOL 10 MG/ML IV BOLUS
INTRAVENOUS | Status: DC | PRN
  Administered 2021-07-10: 40 mg via INTRAVENOUS

## 2021-07-10 MED ORDER — LACTATED RINGERS IV SOLN
INTRAVENOUS | Status: AC | PRN
  Administered 2021-07-10: 10 mL/h via INTRAVENOUS

## 2021-07-10 MED ORDER — SUCRALFATE 1 GM/10ML PO SUSP
1.0000 g | Freq: Four times a day (QID) | ORAL | Status: DC
  Administered 2021-07-10 – 2021-07-12 (×8): 1 g via ORAL
  Filled 2021-07-10 (×8): qty 10

## 2021-07-10 MED ORDER — LACTATED RINGERS IV SOLN: INTRAVENOUS | Status: DC | PRN

## 2021-07-10 SURGICAL SUPPLY — 15 items

## 2021-07-10 NOTE — ED Notes (Signed)
89M concerned about cardizem drip order d/t floor not taking drips. This RN notified 89M about pt being on PO cardizem now. MD Toniann Fail paged about possibly d/c cardizem drip order d/t pt only taking PO

## 2021-07-10 NOTE — Op Note (Signed)
[facility] Patient Name: Martin Downs Procedure Date : 07/10/2021 MRN: [id number] Attending MD: Carlota Raspberry. Havery Moros , MD Date of Birth: September 15, 1965 CSN: [id number] Age: 55 Admit Type: Inpatient Procedure:                Upper GI endoscopy Indications:              Upper abdominal pain, Abnormal CT of the GI tract                            showing duodenitis / duodenal ulcer - admitted with                            DKA. Significant NSAID use recently for joint pains                            (ibuprofen) Providers:                Carlota Raspberry. Havery Moros, MD, Brooke Person, Frazier Richards, Technician Referring MD:              Medicines:                Monitored Anesthesia Care Complications:            No immediate complications. Estimated blood loss:                            Minimal. Estimated Blood Loss:     Estimated blood loss was minimal. Procedure:                Pre-Anesthesia Assessment:                           - Prior to the procedure, a History and Physical                            was performed, and patient medications and                            allergies were reviewed. The patient's tolerance of                            previous anesthesia was also reviewed. The risks                            and benefits of the procedure and the sedation                            options and risks were discussed with the patient.                            All questions were answered, and informed consent  was obtained. Prior Anticoagulants: The patient has                            taken no previous anticoagulant or antiplatelet                            agents. ASA Grade Assessment: III - A patient with                            severe systemic disease. After reviewing the risks                            and benefits, the patient was deemed in                            satisfactory condition to undergo  the procedure.                           After obtaining informed consent, the endoscope was                            passed under direct vision. Throughout the                            procedure, the patient's blood pressure, pulse, and                            oxygen saturations were monitored continuously. The                            [id number] Olympus endoscope was introduced                            through the mouth, and advanced to the second part                            of duodenum. The upper GI endoscopy was                            accomplished without difficulty. The patient                            tolerated the procedure well. Scope In: Scope Out: Findings:      Esophagogastric landmarks were identified: the Z-line was found at 38       cm, the gastroesophageal junction was found at 38 cm and the upper       extent of the gastric folds was found at 39 cm from the incisors.      A 1 cm hiatal hernia was present.      LA Grade D (one or more mucosal breaks involving at least 75% of       esophageal circumference) esophagitis was found 28 to 38 cm from the       incisors, with minimal nonulcerated normal mucosa in the segment, almost  diffuse circumferential ulcer of the entire lower esophagus. Biopsies       were taken with a cold forceps for histology.      The exam of the esophagus was otherwise normal.      Innumerable non-bleeding cratered and superficial gastric ulcers with a       clean ulcer base (Forrest Class III) were found in the gastric body,       ranging from 2-25mm in size. No high risk lesions for bleeding noted.      Diffuse inflammation characterized by erosions, erythema and friability       was found in the entire examined stomach. Biopsies were taken with a       cold forceps for Helicobacter pylori testing.      The exam of the stomach was otherwise normal.      Many non-bleeding superficial duodenal ulcers with no stigmata  of       bleeding were found in the duodenal bulb and in the second portion of       the duodenum. All clean based, no high risk stigmata for bleeding noted.      The exam of the distal duodenum was otherwise normal. Impression:               - Esophagogastric landmarks identified.                           - 1 cm hiatal hernia.                           - Severe / LA Grade D esophagitis. Biopsied to rule                            out viral etiology but less likely.                           - Non-bleeding innumerable small gastric ulcers                            with a clean ulcer base (Forrest Class III).                           - Diffuse gastritis. Biopsied.                           - Numerous non-bleeding / clean based duodenal                            ulcers with no stigmata of bleeding.                           Overall, signficant ulcerations noted as above,                            causing CT scan findings and the patient's pain in                            the setting of NSAID use. Severe ulceration of the  esophagus noted, at risk for stricturing while this                            heals. Fortunately he has not had any GI bleeding                            from this. This will take some time to heal and for                            symptoms to resolve. Recommendation:           - Return patient to hospital ward for ongoing care.                           - Liquid diet, advance slowly as tolerated                           - Continue present medications.                           - Continue protonix BID while inpatient then then                            transition to oral protonix 40mg  BID for 8 weeks as                            outpatient and then once daily until follow up EGD                            in 6-8 weeks to assess for mucosal healing and rule                            out underlying Barrett's                           -  Start liquid carafate 10cc po every 6 hours PRN -                            please note this may make stools dark                           - Await pathology results to rule out H pylori.                           - NO NSAIDS                           - Outpatient follow up EGD in 6-8 weeks, our office                            will coordinate.                           - We will sign  off for now, please call with                            questions moving forward Procedure Code(s):        --- Professional ---                           [phone], Esophagogastroduodenoscopy, flexible,                            transoral; with biopsy, single or multiple Diagnosis Code(s):        --- Professional ---                           K44.9, Diaphragmatic hernia without obstruction or                            gangrene                           K20.90, Esophagitis, unspecified without bleeding                           K25.9, Gastric ulcer, unspecified as acute or                            chronic, without hemorrhage or perforation                           K29.70, Gastritis, unspecified, without bleeding                           K26.9, Duodenal ulcer, unspecified as acute or                            chronic, without hemorrhage or perforation                           R10.10, Upper abdominal pain, unspecified                           R93.3, Abnormal findings on diagnostic imaging of                            other parts of digestive tract CPT copyright 2019 [employer]. All rights reserved. The codes documented in this report are preliminary and upon coder review may  be revised to meet current compliance requirements. Remo Lipps P. Starleen Trussell, MD 07/10/2021 1:36:04 PM This report has been signed electronically. Number of Addenda: 0

## 2021-07-10 NOTE — Transfer of Care (Signed)
Immediate Anesthesia Transfer of Care Note  Patient: Martin Downs  Procedure(s) Performed: ESOPHAGOGASTRODUODENOSCOPY (EGD) WITH PROPOFOL BIOPSY  Patient Location: Endoscopy Unit  Anesthesia Type:MAC  Level of Consciousness: drowsy  Airway & Oxygen Therapy: Patient Spontanous Breathing  Post-op Assessment: Report given to RN, Post -op Vital signs reviewed and stable and Patient moving all extremities X 4  Post vital signs: Reviewed and stable  Last Vitals:  Vitals Value Taken Time  BP    Temp    Pulse    Resp    SpO2      Last Pain:  Vitals:   07/10/21 1153  TempSrc: Temporal  PainSc: 4          Complications: No notable events documented.

## 2021-07-10 NOTE — Progress Notes (Signed)
OT Cancellation Note  Patient Details Name: Terrin Meddaugh MRN: [id number] DOB: 1965/11/25   Cancelled Treatment:    Reason Eval/Treat Not Completed: Patient at procedure or test/ unavailable. Pt out of room for EGD. Will follow up as able.  Hillard Danker, OT/L  Acute Rehab [phone]  Lenice Llamas 07/10/2021, 1:32 PM

## 2021-07-10 NOTE — Anesthesia Postprocedure Evaluation (Signed)
Anesthesia Post Note  Patient: Martin Downs  Procedure(s) Performed: ESOPHAGOGASTRODUODENOSCOPY (EGD) WITH PROPOFOL BIOPSY     Patient location during evaluation: PACU Anesthesia Type: MAC Level of consciousness: awake and alert Pain management: pain level controlled Vital Signs Assessment: post-procedure vital signs reviewed and stable Respiratory status: spontaneous breathing, nonlabored ventilation and respiratory function stable Cardiovascular status: blood pressure returned to baseline and stable Postop Assessment: no apparent nausea or vomiting Anesthetic complications: no   No notable events documented.  Last Vitals:  Vitals:   07/10/21 1340 07/10/21 1350  BP: (!) 122/44 138/64  Pulse: 66 65  Resp: 16 18  Temp:    SpO2: 100% 100%    Last Pain:  Vitals:   07/10/21 1350  TempSrc:   PainSc: 0-No pain                 Pervis Hocking

## 2021-07-10 NOTE — Progress Notes (Signed)
Physical Therapy Treatment Patient Details Name: Martin Downs MRN: [id number] DOB: 15-Jun-1966 Today's Date: 07/10/2021   History of Present Illness Pt is a 55 y.o. male who presented 07/08/21 with x4 day hx of nausea and emesis. Pt had not taken his meds for 4 days and was having elevated blood sugars at home, per chart. Pt presented tachycardic with HR of 165. Pt admitted with DKA, Afib with RVR, and CT evidence of peptic ulcer disease and duodenitis. PMH: type 2 diabetes on insulin, chronic methadone user, hyperlipidemia    PT Comments    Pt progressing well with mobility. He is NPO this AM for EGD, but eager to get off the stretcher and mobilize. He required min assist bed mobility, min guard assist transfers, and min/HHA ambulation 15' x 2. VSS on RA. Pt returned to stretcher at end of session. Rollator vs RW will be helpful for increased distances. Pt motivated to increase mobility and return home.    Recommendations for follow up therapy are one component of a multi-disciplinary discharge planning process, led by the attending physician.  Recommendations may be updated based on patient status, additional functional criteria and insurance authorization.  Follow Up Recommendations  Home health PT     Assistance Recommended at Discharge Frequent or constant Supervision/Assistance  Equipment Recommendations  Rollator (4 wheels);BSC/3in1 (pending progression)    Recommendations for Other Services       Precautions / Restrictions Precautions Precautions: Fall;Other (comment) Precaution Comments: monitor HR and BP     Mobility  Bed Mobility Overal bed mobility: Needs Assistance Bed Mobility: Supine to Sit;Sit to Supine     Supine to sit: Min assist Sit to supine: Min assist   General bed mobility comments: assist to elevate trunk, assist with BLE back to stretcher    Transfers Overall transfer level: Needs assistance Equipment used: 1 person hand held  assist Transfers: Sit to/from Stand Sit to Stand: Min guard;From elevated surface           General transfer comment: cues for sequencing    Ambulation/Gait Ambulation/Gait assistance: Min assist Gait Distance (Feet): 15 Feet (x 2) Assistive device: IV Pole;1 person hand held assist Gait Pattern/deviations: Step-through pattern;Shuffle;Decreased stride length Gait velocity: decreased Gait velocity interpretation: <1.8 ft/sec, indicate of risk for recurrent falls   General Gait Details: slow, guarded gait. RW will be helpful for increased distances.   Stairs             Wheelchair Mobility    Modified Rankin (Stroke Patients Only)       Balance Overall balance assessment: Mild deficits observed, not formally tested                                          Cognition Arousal/Alertness: Awake/alert Behavior During Therapy: Flat affect Overall Cognitive Status: Impaired/Different from baseline Area of Impairment: Attention;Memory;Following commands;Safety/judgement;Awareness;Problem solving                   Current Attention Level: Selective Memory: Decreased short-term memory Following Commands: Follows one step commands consistently;Follows one step commands with increased time Safety/Judgement: Decreased awareness of safety;Decreased awareness of deficits Awareness: Emergent Problem Solving: Slow processing;Difficulty sequencing;Requires verbal cues          Exercises      General Comments General comments (skin integrity, edema, etc.): VSS on RA      Pertinent Vitals/Pain Pain  Assessment: 0-10 Pain Score: 4  Pain Location: abdomen Pain Descriptors / Indicators: Guarding;Grimacing Pain Intervention(s): Monitored during session;Repositioned;Limited activity within patient's tolerance    Home Living                          Prior Function            PT Goals (current goals can now be found in the care plan  section) Acute Rehab PT Goals Patient Stated Goal: to walk Progress towards PT goals: Progressing toward goals    Frequency    Min 3X/week      PT Plan Current plan remains appropriate    Co-evaluation              AM-PAC PT "6 Clicks" Mobility   Outcome Measure  Help needed turning from your back to your side while in a flat bed without using bedrails?: A Little Help needed moving from lying on your back to sitting on the side of a flat bed without using bedrails?: A Little Help needed moving to and from a bed to a chair (including a wheelchair)?: A Little Help needed standing up from a chair using your arms (e.g., wheelchair or bedside chair)?: A Little Help needed to walk in hospital room?: A Little Help needed climbing 3-5 steps with a railing? : A Lot 6 Click Score: 17    End of Session Equipment Utilized During Treatment: Gait belt Activity Tolerance: Patient tolerated treatment well Patient left: in bed;with call bell/phone within reach;with family/visitor present Nurse Communication: Mobility status PT Visit Diagnosis: Unsteadiness on feet (R26.81);Other abnormalities of gait and mobility (R26.89);Muscle weakness (generalized) (M62.81);Difficulty in walking, not elsewhere classified (R26.2)     Time: 1194-1740 PT Time Calculation (min) (ACUTE ONLY): 24 min  Charges:  $Gait Training: 23-37 mins                     Aida Raider, [place]  Office # [phone] Pager [phone]    Ilda Foil 07/10/2021, 10:25 AM

## 2021-07-10 NOTE — ED Notes (Signed)
Pt remains alert, NAD, calm, interactive, resps e/u, speaking clearly, family x3 at [facility]. VSS. Evening meal given.

## 2021-07-10 NOTE — Anesthesia Preprocedure Evaluation (Addendum)
Anesthesia Evaluation  Patient identified by MRN, date of birth, ID band Patient awake    Reviewed: Allergy & Precautions, NPO status , Patient's Chart, lab work & pertinent test results  Airway Mallampati: IV  TM Distance: >3 FB Neck ROM: Full    Dental  (+) Teeth Intact, Dental Advisory Given   Pulmonary sleep apnea (no CPAP) ,  Quit smoking 2021   Pulmonary exam normal breath sounds clear to auscultation       Cardiovascular + DOE  Normal cardiovascular exam+ dysrhythmias (afib w/ RVR on admission (DKA)- 165bpm) Atrial Fibrillation  Rhythm:Irregular Rate:Tachycardia     Neuro/Psych PSYCHIATRIC DISORDERS Anxiety Depression negative neurological ROS     GI/Hepatic Neg liver ROS, Abdominal pain   Endo/Other  diabetes, Poorly Controlled, Type 2, Insulin Dependent, Oral Hypoglycemic AgentsHypothyroidism a1c 11.5 Admitted in DKA FS 122 in preop  Renal/GU   negative genitourinary   Musculoskeletal negative musculoskeletal ROS (+)   Abdominal   Peds  Hematology hct 37.3, plt 199   Anesthesia Other Findings   Reproductive/Obstetrics negative OB ROS                           Anesthesia Physical Anesthesia Plan  ASA: 3  Anesthesia Plan: MAC   Post-op Pain Management:    Induction:   PONV Risk Score and Plan: 2 and Propofol infusion and TIVA  Airway Management Planned: Natural Airway and Simple Face Mask  Additional Equipment: None  Intra-op Plan:   Post-operative Plan:   Informed Consent: I have reviewed the patients History and Physical, chart, labs and discussed the procedure including the risks, benefits and alternatives for the proposed anesthesia with the patient or authorized representative who has indicated his/her understanding and acceptance.     Dental advisory given  Plan Discussed with: CRNA  Anesthesia Plan Comments:        Anesthesia Quick Evaluation

## 2021-07-10 NOTE — Progress Notes (Signed)
  Echocardiogram 2D Echocardiogram has been performed.  Martin Downs 07/10/2021, 8:55 AM

## 2021-07-10 NOTE — Progress Notes (Signed)
PROGRESS NOTE                                                                                                                                                                                                             Patient Demographics:    Martin Downs, is a 55 y.o. male, DOB - 08/08/1965, ZOX:[id number]  Outpatient Primary MD for the patient is Lynnda Child, MD    LOS - 1  Admit date - 07/08/2021    Chief Complaint  Patient presents with   Emesis       Brief Narrative (HPI from H&P)  55 year old male with a history of type 2 diabetes on insulin, chronic methadone user, hyperlipidemia presents to the ER today with 4-day history of nausea and vomiting, he also had gradually progressive epigastric abdominal pain which is at times radiating to his back, for some reason he stopped taking his insulin for the last 4 weeks, in the ER he was diagnosed with DKA, new A. fib RVR along with CT evidence of peptic ulcer disease and duodenitis and he was admitted for further   Subjective:   Patient in bed in no distress denies any chest pain, still has some epigastric discomfort, no nausea vomiting or shortness of breath.   Assessment  & Plan :     DKA in a patient with DM type II due to insulin noncompliance - he has been strictly counseled on insulin compliance in the presence of his wife on 07/09/2021, DKA is much better, will transition him to Lantus twice daily dose along with sliding scale.  Has been adequately hydrated, continue to monitor.  Counseled on compliance.  2.  Dehydration with hyponatremia.  Due to #1 above.  Much improved after appropriate hydration with IV fluids.  3.  Severe epigastric pain due to possible peptic ulcer disease along with duodenitis as evident on CT scan.  Clear liquid diet, IV PPI drip for now, GI consulted and proceed with EGD on 07/10/2021, lipase is borderline, no CT evidence of  pancreatitis.  4.  Chronic pain and chronic narcotic use.  Continue home dose methadone.  Strictly counseled not to overuse narcotics and get oversedated.  As needed Narcan also added  5.  Dyslipidemia.  Home dose statin and fenofibrate resumed.  6. Mood Disorder.  On Risperdal continued.  He is  also on Depakote, he does not clearly not recall the reason right now.  7.  Paroxysmal atrial fibrillation.  Could be due to pain and dehydration, [place] vas 2 score is 1.  Checking TSH and echo, placed on Cardizem.  Outpatient evaluation by cardiology and consideration of aspirin versus anticoagulation once his GI issues have stabilized.        Condition - Extremely Guarded  Family Communication  :  wife bedside 07/09/21  Code Status :  Full  Consults  :  GI  PUD Prophylaxis : PPI   Procedures  :     TTE  EGD.    CT - 1. Moderate acute inflammation throughout the duodenum. Mild secondary inflammation of the distal CBD, and the right anterior pararenal space, but no bile duct enlargement. Top differential considerations are acute peptic ulcer disease and infectious duodenitis. No free air or free fluid. 2. No other acute or inflammatory process identified in the abdomen or pelvis. Normal appendix. 3. Punctate bilateral nephrolithiasis. Calcified coronary artery and Aortic Atherosclerosis (ICD10-I70.0).      Disposition Plan  :    Status is: Inpt    DVT Prophylaxis  :    enoxaparin (LOVENOX) injection 40 mg Start: 07/09/21 1800 SCDs Start: 07/09/21 0538   Lab Results  Component Value Date   PLT 199 07/10/2021    Diet :  Diet Order             Diet NPO time specified  Diet effective midnight                    Inpatient Medications  Scheduled Meds:  alum & mag hydroxide-simeth  60 mL Oral BID   diltiazem  60 mg Oral Q8H   divalproex  500 mg Oral Daily   enoxaparin (LOVENOX) injection  40 mg Subcutaneous Q24H   fenofibrate  160 mg Oral Daily   insulin aspart   0-5 Units Subcutaneous QHS   insulin aspart  0-9 Units Subcutaneous TID WC   insulin glargine-yfgn  35 Units Subcutaneous BID   methadone  90 mg Oral Daily   [START ON 07/12/2021] pantoprazole  40 mg Intravenous Q12H   risperiDONE  0.5 mg Oral BID   rosuvastatin  10 mg Oral Daily   Continuous Infusions:  diltiazem (CARDIZEM) infusion 5 mg/hr (07/10/21 0807)   lactated ringers with kcl 50 mL/hr at 07/10/21 0825   pantoprazole 8 mg/hr (07/10/21 1049)   PRN Meds:.diltiazem, ondansetron **OR** ondansetron (ZOFRAN) IV  Antibiotics  :    Anti-infectives (From admission, onward)    None        Time Spent in minutes  30   Susa Raring M.D on 07/10/2021 at 11:20 AM  To page go to [email or web address]   [facility] -  Office  [phone]  See all Orders from today for further details    Objective:   Vitals:   07/10/21 0400 07/10/21 0500 07/10/21 0600 07/10/21 1000  BP: 125/64 (!) 122/59 131/65 135/65  Pulse: 80 73 73 70  Resp: (!) 9 15 14 19   Temp:      TempSrc:      SpO2: 98% 95% 94% 97%  Weight:      Height:        Wt Readings from Last 3 Encounters:  07/09/21 86.2 kg  02/07/21 89.5 kg  12/12/20 87.7 kg     Intake/Output Summary (Last 24 hours) at 07/10/2021 1120 Last data filed at 07/10/2021 1051 Gross per 24  hour  Intake 850 ml  Output 800 ml  Net 50 ml     Physical Exam  Awake Alert, No new F.N deficits, Normal affect [place].AT,PERRAL Supple Neck, No JVD,   Symmetrical Chest wall movement, Good air movement bilaterally, CTAB RRR,No Gallops, Rubs or new Murmurs,  +ve B.Sounds, Abd Soft, No tenderness,   No Cyanosis, Clubbing or edema      Data Review:    CBC Recent Labs  Lab 07/08/21 2327 07/09/21 0015 07/10/21 0530  WBC 17.6*  --  7.9  HGB 18.2* 18.4* 13.2  HCT 52.0 54.0* 37.3*  PLT 403*  --  199  MCV 85.0  --  83.1  MCH 29.7  --  29.4  MCHC 35.0  --  35.4  RDW 12.8  --  12.7  LYMPHSABS 0.9  --  1.5  MONOABS 1.5*  --  0.9   EOSABS 0.0  --  0.1  BASOSABS 0.0  --  0.0    Electrolytes Recent Labs  Lab 07/08/21 0445 07/08/21 2327 07/08/21 2346 07/09/21 0002 07/09/21 0015 07/09/21 0600 07/10/21 0530  NA  --  123*  --   --  122* 128* 131*  K  --  5.1  --   --  5.3* 4.0 3.4*  CL  --  91*  --   --   --  102 99  CO2  --  11*  --   --   --  12* 23  GLUCOSE  --  539*  --   --   --  162* 167*  BUN  --  37*  --   --   --  28* 11  CREATININE  --  1.62*  --   --   --  1.06 0.87  CALCIUM  --  8.2*  --   --   --  7.4* 7.6*  AST  --  14*  --   --   --   --  11*  ALT  --  17  --   --   --   --  11  ALKPHOS  --  65  --   --   --   --  37*  BILITOT  --  1.7*  --   --   --   --  1.0  ALBUMIN  --  3.8  --   --   --   --  2.5*  MG  --   --  2.6*  --   --   --  2.2  LATICACIDVEN 1.1  --   --  1.8  --   --   --   HGBA1C  --   --   --   --   --  11.5*  --     ------------------------------------------------------------------------------------------------------------------ No results for input(s): CHOL, HDL, LDLCALC, TRIG, CHOLHDL, LDLDIRECT in the last 72 hours.  Lab Results  Component Value Date   HGBA1C 11.5 (H) 07/09/2021    No results for input(s): TSH, T4TOTAL, T3FREE, THYROIDAB in the last 72 hours.  Invalid input(s): FREET3 ------------------------------------------------------------------------------------------------------------------ ID Labs Recent Labs  Lab 07/08/21 0445 07/08/21 2327 07/09/21 0002 07/09/21 0600 07/10/21 0530  WBC  --  17.6*  --   --  7.9  PLT  --  403*  --   --  199  LATICACIDVEN 1.1  --  1.8  --   --   CREATININE  --  1.62*  --  1.06 0.87   Cardiac Enzymes No results for  input(s): CKMB, TROPONINI, MYOGLOBIN in the last 168 hours.  Invalid input(s): CK     Radiology Reports DG Abdomen 1 View  Result Date: 07/09/2021 CLINICAL DATA:  Abdominal pain, nausea and vomiting.  Tachycardia. EXAM: PORTABLE CHEST - 1 VIEW; ABDOMEN - 1 VIEW COMPARISON:  09/23/2018. FINDINGS:  The heart and mediastinal silhouette are within normal limits. Lung volumes are low. No consolidation, effusion, or pneumothorax. There is a nonobstructive bowel gas pattern. No free air is identified. Surgical clips are present in the right upper quadrant. Degenerative changes are present in the thoracolumbar spine and bilateral hips. IMPRESSION: 1. No acute process in the chest. 2. No bowel obstruction or free air. Electronically Signed   By: Thornell Sartorius M.D.   On: 07/09/2021 00:42   CT ABDOMEN PELVIS W CONTRAST  Result Date: 07/09/2021 CLINICAL DATA:  55 year old male with abdominal pain, nausea vomiting diarrhea for a few days. Uncontrolled diabetes. EXAM: CT ABDOMEN AND PELVIS WITH CONTRAST TECHNIQUE: Multidetector CT imaging of the abdomen and pelvis was performed using the standard protocol following bolus administration of intravenous contrast. CONTRAST:  65mL ISOVUE-370 IOPAMIDOL (ISOVUE-370) INJECTION 76% COMPARISON:  CT Abdomen and Pelvis 04/16/2017. FINDINGS: Lower chest: Calcified coronary artery atherosclerosis. No cardiomegaly or pericardial effusion. Lower lung volumes, mild lung base atelectasis. Hepatobiliary: Chronically absent gallbladder. Liver enhancement within normal limits. No bile duct dilatation. Pancreas: Fatty atrophied throughout. No pancreatic ductal dilatation. Spleen: Negative. Adrenals/Urinary Tract: Normal adrenal glands. Secondary anterior right pararenal space inflammation, see duodenum below. The knees appear symmetric and nonobstructed. There is punctate bilateral nephrolithiasis. Symmetric renal contrast excretion on the delayed images. Negative ureters and bladder. Stomach/Bowel: Redundant large bowel with mild retained stool throughout. Small volume of fluid in the right colon. No convincing large bowel inflammation. Normal appendix on series 3, image 55. Decompressed and negative terminal ileum. No dilated small bowel. However, there is pronounced mucosal  hyperenhancement and inflammation throughout the duodenum. See series 3, images 29 through 41 and coronal image 49. Indistinct duodenal wall with inflammatory stranding. And the distal CBD also appears to be enhancing although is nondilated (coronal image 48). No regional free air or free fluid. Inflammatory stranding does involve the anterior right pararenal space. The stomach and ligament of Treitz have a more normal appearance. No other bowel inflammation identified. Vascular/Lymphatic: Mild Aortoiliac calcified atherosclerosis. Major arterial structures in the abdomen and pelvis are patent. Portal venous system is patent. No lymphadenopathy. Reproductive: Negative. Other: No pelvic free fluid. Musculoskeletal: T12 superior endplate Schmorl's node is new since 2018. No acute osseous abnormality identified. IMPRESSION: 1. Moderate acute inflammation throughout the duodenum. Mild secondary inflammation of the distal CBD, and the right anterior pararenal space, but no bile duct enlargement. Top differential considerations are acute peptic ulcer disease and infectious duodenitis. No free air or free fluid. 2. No other acute or inflammatory process identified in the abdomen or pelvis. Normal appendix. 3. Punctate bilateral nephrolithiasis. Calcified coronary artery and Aortic Atherosclerosis (ICD10-I70.0). Electronically Signed   By: Odessa Fleming M.D.   On: 07/09/2021 04:37   DG Chest Portable 1 View  Result Date: 07/09/2021 CLINICAL DATA:  Abdominal pain, nausea and vomiting.  Tachycardia. EXAM: PORTABLE CHEST - 1 VIEW; ABDOMEN - 1 VIEW COMPARISON:  09/23/2018. FINDINGS: The heart and mediastinal silhouette are within normal limits. Lung volumes are low. No consolidation, effusion, or pneumothorax. There is a nonobstructive bowel gas pattern. No free air is identified. Surgical clips are present in the right upper quadrant. Degenerative changes are present  in the thoracolumbar spine and bilateral hips. IMPRESSION: 1.  No acute process in the chest. 2. No bowel obstruction or free air. Electronically Signed   By: Thornell Sartorius M.D.   On: 07/09/2021 00:42

## 2021-07-10 NOTE — ED Notes (Signed)
Echo at [facility]. Family at [facility].

## 2021-07-10 NOTE — ED Notes (Signed)
CMP reposted from 2d ago, not current, BMP drawn to clarify. Pt alert, NAD, calm, interactive, speech clear. Protonix, cardizem and LR with potassium infusing. Pt taken to Endo. No bed assignment at this time. At this time, pt will return to ED 37. Family and belongings remain in room 45.

## 2021-07-10 NOTE — Interval H&P Note (Signed)
History and Physical Interval Note: Patient here for EGD today to further evaluate abdominal pain and abnormal CT scan which suggests a duodenal ulcer / duodenitis. On PPI since admission and feeling better. DKA also better. He admits to frequent ibuprofen use for treatment of joint pains. Have high suspicion for PUD. I have discussed endoscopy and anesthesia with him, risks / benefits, he wishes to proceed. Further recommendations pending the results.  07/10/2021 12:15 PM  Martin Downs  has presented today for surgery, with the diagnosis of Abnormal CT scan, NSAID use, abdominal pain.  The various methods of treatment have been discussed with the patient and family. After consideration of risks, benefits and other options for treatment, the patient has consented to  Procedure(s): ESOPHAGOGASTRODUODENOSCOPY (EGD) WITH PROPOFOL (N/A) as a surgical intervention.  The patient's history has been reviewed, patient examined, no change in status, stable for surgery.  I have reviewed the patient's chart and labs.  Questions were answered to the patient's satisfaction.     Viviann Spare P Parth Mccormac

## 2021-07-10 NOTE — Evaluation (Signed)
Occupational Therapy Evaluation Patient Details Name: Martin Downs MRN: [id number] DOB: [birth year] Today's Date: 07/10/2021   History of Present Illness Pt is a 55 y.o. male who presented 07/08/21 with x4 day hx of nausea and emesis. Pt had not taken his meds for 4 days and was having elevated blood sugars at home, per chart. Pt presented tachycardic with HR of 165. Pt admitted with DKA, Afib with RVR, and CT evidence of peptic ulcer disease and duodenitis. PMH: type 2 diabetes on insulin, chronic methadone user, hyperlipidemia   Clinical Impression   Pt presents with decreased balance, activity tolerance, and cognition. Pt currently requiring supervision - Min guard for ADLs and functional transfers/mobility. Pt is independent at baseline and reports that he will have assistance available at home. Do not anticipate pt will need further skilled OT services after d/c. Will follow acutely to maximize safety/independence with ADLs prior to return home.      Recommendations for follow up therapy are one component of a multi-disciplinary discharge planning process, led by the attending physician.  Recommendations may be updated based on patient status, additional functional criteria and insurance authorization.   Follow Up Recommendations  No OT follow up    Assistance Recommended at Discharge Intermittent Supervision/Assistance  Functional Status Assessment  Patient has had a recent decline in their functional status and demonstrates the ability to make significant improvements in function in a reasonable and predictable amount of time.  Equipment Recommendations  BSC/3in1;Tub/shower seat    Recommendations for Other Services       Precautions / Restrictions Precautions Precautions: Fall;Other (comment) Precaution Comments: monitor HR and BP Restrictions Weight Bearing Restrictions: No      Mobility Bed Mobility Overal bed mobility: Needs Assistance Bed Mobility: Supine to  Sit;Sit to Supine     Supine to sit: Min guard Sit to supine: Min guard        Transfers Overall transfer level: Needs assistance   Transfers: Sit to/from Stand Sit to Stand: Min guard;From elevated surface                  Balance Overall balance assessment: Mild deficits observed, not formally tested                                         ADL either performed or assessed with clinical judgement   ADL Overall ADL's : Needs assistance/impaired Eating/Feeding: Independent   Grooming: Min guard;Standing   Upper Body Bathing: Set up;Sitting   Lower Body Bathing: Min guard;Sit to/from stand   Upper Body Dressing : Set up;Sitting   Lower Body Dressing: Min guard;Sit to/from stand   Toilet Transfer: Min guard;Ambulation;Regular Social worker and Hygiene: Supervision/safety       Functional mobility during ADLs: Min guard       Vision Patient Visual Report: No change from baseline       Perception     Praxis      Pertinent Vitals/Pain Pain Assessment: 0-10 Pain Score: 4  Pain Location: abdomen Pain Descriptors / Indicators: Guarding;Grimacing Pain Intervention(s): Monitored during session     Hand Dominance Right   Extremity/Trunk Assessment Upper Extremity Assessment Upper Extremity Assessment: Overall WFL for tasks assessed   Lower Extremity Assessment Lower Extremity Assessment: Defer to PT evaluation   Cervical / Trunk Assessment Cervical / Trunk Assessment: Normal   Communication Communication Communication: No  difficulties   Cognition Arousal/Alertness: Awake/alert Behavior During Therapy: Flat affect Overall Cognitive Status: Impaired/Different from baseline Area of Impairment: Attention;Memory;Following commands;Safety/judgement;Awareness;Problem solving                   Current Attention Level: Selective Memory: Decreased short-term memory Following Commands: Follows one  step commands consistently;Follows one step commands with increased time Safety/Judgement: Decreased awareness of safety;Decreased awareness of deficits Awareness: Emergent Problem Solving: Slow processing;Difficulty sequencing;Requires verbal cues       General Comments       Exercises     Shoulder Instructions      Home Living Family/patient expects to be discharged to:: Private residence Living Arrangements: Spouse/significant other Available Help at Discharge: Family;Available 24 hours/day Type of Home: House Home Access: Stairs to enter [employer] of Steps: 3-4 Entrance Stairs-Rails: None Home Layout: Two level;Able to live on main level with bedroom/bathroom;Bed/bath upstairs Alternate Level Stairs-Number of Steps: flight Alternate Level Stairs-Rails: None Bathroom Shower/Tub: Chief Strategy Officer: Standard     Home Equipment: None   Additional Comments: Daughter is a PA      Prior Functioning/Environment Prior Level of Function : Independent/Modified Independent;Driving                        OT Problem List: Decreased activity tolerance;Impaired balance (sitting and/or standing);Decreased cognition;Decreased safety awareness;Decreased knowledge of use of DME or AE      OT Treatment/Interventions: Self-care/ADL training;Therapeutic exercise;DME and/or AE instruction;Therapeutic activities;Patient/family education;Balance training    OT Goals(Current goals can be found in the care plan section) Acute Rehab OT Goals Patient Stated Goal: return home OT Goal Formulation: With patient Time For Goal Achievement: 07/24/21 Potential to Achieve Goals: Good  OT Frequency: Min 2X/week   Barriers to D/C:            Co-evaluation              AM-PAC OT "6 Clicks" Daily Activity     Outcome Measure Help from another person eating meals?: None Help from another person taking care of personal grooming?: A Little Help from  another person toileting, which includes using toliet, bedpan, or urinal?: A Little Help from another person bathing (including washing, rinsing, drying)?: A Little Help from another person to put on and taking off regular upper body clothing?: A Little Help from another person to put on and taking off regular lower body clothing?: A Little 6 Click Score: 19   End of Session Equipment Utilized During Treatment: Gait belt Nurse Communication: Mobility status  Activity Tolerance: Patient tolerated treatment well Patient left: in bed;with call bell/phone within reach  OT Visit Diagnosis: Unsteadiness on feet (R26.81)                Time: 8756-4332 OT Time Calculation (min): 14 min Charges:  OT General Charges $OT Visit: 1 Visit OT Evaluation $OT Eval Low Complexity: 1 Low  Sloan Galentine C, OT/L  Acute Rehab [phone]  Lenice Llamas 07/10/2021, 4:00 PM

## 2021-07-10 NOTE — ED Notes (Signed)
Pt placement contacted re: delay in bed assignment. Pending assignment.

## 2021-07-10 NOTE — ED Notes (Signed)
Dr Singh at BS

## 2021-07-11 DIAGNOSIS — K253 Acute gastric ulcer without hemorrhage or perforation: Secondary | ICD-10-CM

## 2021-07-11 DIAGNOSIS — R9389 Abnormal findings on diagnostic imaging of other specified body structures: Secondary | ICD-10-CM

## 2021-07-11 LAB — COMPREHENSIVE METABOLIC PANEL
ALT: 12 U/L (ref 0–44)
AST: 12 U/L — ABNORMAL LOW (ref 15–41)
Albumin: 2.6 g/dL — ABNORMAL LOW (ref 3.5–5.0)
Alkaline Phosphatase: 35 U/L — ABNORMAL LOW (ref 38–126)
Anion gap: 5 (ref 5–15)
BUN: 6 mg/dL (ref 6–20)
CO2: 31 mmol/L (ref 22–32)
Calcium: 8 mg/dL — ABNORMAL LOW (ref 8.9–10.3)
Chloride: 100 mmol/L (ref 98–111)
Creatinine, Ser: 0.67 mg/dL (ref 0.61–1.24)
GFR, Estimated: >60 mL/min (ref >60)
Glucose, Bld: 115 mg/dL — ABNORMAL HIGH (ref 70–99)
Potassium: 3.4 mmol/L — ABNORMAL LOW (ref 3.5–5.1)
Sodium: 136 mmol/L (ref 135–145)
Total Bilirubin: 0.4 mg/dL (ref 0.3–1.2)
Total Protein: 4.6 g/dL — ABNORMAL LOW (ref 6.5–8.1)

## 2021-07-11 LAB — GLUCOSE, CAPILLARY
Glucose-Capillary: 161 mg/dL — ABNORMAL HIGH (ref 70–99)
Glucose-Capillary: 204 mg/dL — ABNORMAL HIGH (ref 70–99)
Glucose-Capillary: 179 mg/dL — ABNORMAL HIGH (ref 70–99)
Glucose-Capillary: 225 mg/dL — ABNORMAL HIGH (ref 70–99)

## 2021-07-11 LAB — CBC WITH DIFFERENTIAL/PLATELET
Abs Immature Granulocytes: 0.01 10*3/uL (ref 0.00–0.07)
Basophils Absolute: 0 10*3/uL (ref 0.0–0.1)
Basophils Relative: 0 %
Eosinophils Absolute: 0.1 10*3/uL (ref 0.0–0.5)
Eosinophils Relative: 1 %
HCT: 34.8 % — ABNORMAL LOW (ref 39.0–52.0)
Hemoglobin: 12.5 g/dL — ABNORMAL LOW (ref 13.0–17.0)
Immature Granulocytes: 0 %
Lymphocytes Relative: 25 %
Lymphs Abs: 1.4 10*3/uL (ref 0.7–4.0)
MCH: 29.6 pg (ref 26.0–34.0)
MCHC: 35.9 g/dL (ref 30.0–36.0)
MCV: 82.3 fL (ref 80.0–100.0)
Monocytes Absolute: 0.6 10*3/uL (ref 0.1–1.0)
Monocytes Relative: 11 %
Neutro Abs: 3.6 10*3/uL (ref 1.7–7.7)
Neutrophils Relative %: 63 %
Platelets: 173 10*3/uL (ref 150–400)
RBC: 4.23 MIL/uL (ref 4.22–5.81)
RDW: 12.7 % (ref 11.5–15.5)
WBC: 5.7 10*3/uL (ref 4.0–10.5)
nRBC: 0 % (ref 0.0–0.2)

## 2021-07-11 LAB — MAGNESIUM: Magnesium: 2.3 mg/dL (ref 1.7–2.4)

## 2021-07-11 MED ORDER — LACTATED RINGERS IV BOLUS
250.0000 mL | Freq: Once | INTRAVENOUS | Status: AC
  Administered 2021-07-11: 250 mL via INTRAVENOUS

## 2021-07-11 MED ORDER — INSULIN GLARGINE-YFGN 100 UNIT/ML ~~LOC~~ SOLN
40.0000 [IU] | Freq: Two times a day (BID) | SUBCUTANEOUS | Status: DC
  Administered 2021-07-11 – 2021-07-12 (×3): 40 [IU] via SUBCUTANEOUS
  Filled 2021-07-11 (×4): qty 0.4

## 2021-07-11 MED ORDER — ESCITALOPRAM OXALATE 10 MG PO TABS
10.0000 mg | ORAL_TABLET | Freq: Every day | ORAL | Status: DC
  Administered 2021-07-11: 10 mg via ORAL
  Filled 2021-07-11: qty 1

## 2021-07-11 MED ORDER — POTASSIUM CHLORIDE CRYS ER 20 MEQ PO TBCR
40.0000 meq | EXTENDED_RELEASE_TABLET | Freq: Once | ORAL | Status: AC
  Administered 2021-07-11: 40 meq via ORAL
  Filled 2021-07-11: qty 2

## 2021-07-11 MED ORDER — DULOXETINE HCL 20 MG PO CPEP
20.0000 mg | ORAL_CAPSULE | Freq: Two times a day (BID) | ORAL | Status: DC
  Administered 2021-07-11: 20 mg via ORAL
  Filled 2021-07-11 (×2): qty 1

## 2021-07-11 MED ORDER — PHENOL 1.4 % MT LIQD
1.0000 | OROMUCOSAL | Status: DC | PRN
  Administered 2021-07-11: 1 via OROMUCOSAL
  Filled 2021-07-11: qty 177

## 2021-07-11 NOTE — Progress Notes (Signed)
Patient was a little too slow to respond. Neuro checks done WNL. A&O x4. MD nade aware.

## 2021-07-11 NOTE — Progress Notes (Signed)
Patient on sinus rhythm on telemetry

## 2021-07-11 NOTE — Progress Notes (Signed)
Physical Therapy Treatment Patient Details Name: Martin Downs MRN: [id number] DOB: 08/24/1965 Today's Date: 07/11/2021   History of Present Illness Pt is a 55 y.o. male who presented 07/08/21 with x4 day hx of nausea and emesis. Pt had not taken his meds for 4 days and was having elevated blood sugars at home, per chart. Pt presented tachycardic with HR of 165. Pt admitted with DKA, Afib with RVR, and CT evidence of peptic ulcer disease and duodenitis. PMH: type 2 diabetes on insulin, chronic methadone user, hyperlipidemia    PT Comments    Pt making excellent progress towards his physical therapy goals this session, demonstrating improved activity tolerance, balance, and cognition. Pt ambulating 600 feet with no assistive device at a min guard assist level, HR 80-90's NSR. Continues with mild dynamic balance deficits and difficulty with problem solving and memory. Will continue to follow acutely to address; don't anticipate need for follow up PT.     Recommendations for follow up therapy are one component of a multi-disciplinary discharge planning process, led by the attending physician.  Recommendations may be updated based on patient status, additional functional criteria and insurance authorization.  Follow Up Recommendations  No PT follow up     Assistance Recommended at Discharge Frequent or constant Supervision/Assistance  Equipment Recommendations  None recommended by PT    Recommendations for Other Services       Precautions / Restrictions Precautions Precautions: Fall Restrictions Weight Bearing Restrictions: No     Mobility  Bed Mobility               General bed mobility comments: OOB in chair    Transfers Overall transfer level: Needs assistance Equipment used: None   Sit to Stand: Supervision                Ambulation/Gait Ambulation/Gait assistance: Min guard Gait Distance (Feet): 600 Feet Assistive device: None Gait  Pattern/deviations: Step-through pattern;Decreased stride length Gait velocity: decreased     General Gait Details: Decreased reciprocal arm swing, 2 episodes of mild lateral LOB with balance challenge which pt self corrected, but PT provided cues to stop prior to continuing to take steps. Improved stability with distance   Stairs             Wheelchair Mobility    Modified Rankin (Stroke Patients Only)       Balance Overall balance assessment: Mild deficits observed, not formally tested                                          Cognition Arousal/Alertness: Awake/alert Behavior During Therapy: Flat affect Overall Cognitive Status: Impaired/Different from baseline Area of Impairment: Memory;Problem solving;Safety/judgement                     Memory: Decreased short-term memory   Safety/Judgement: Decreased awareness of deficits   Problem Solving: Requires verbal cues General Comments: Difficulty with problem solving, requiring cues. Decreased STM        Exercises      General Comments        Pertinent Vitals/Pain Pain Assessment: Faces Faces Pain Scale: No hurt    Home Living                          Prior Function  PT Goals (current goals can now be found in the care plan section) Acute Rehab PT Goals Patient Stated Goal: to walk Potential to Achieve Goals: Good Progress towards PT goals: Progressing toward goals    Frequency    Min 3X/week      PT Plan Discharge plan needs to be updated    Co-evaluation              AM-PAC PT "6 Clicks" Mobility   Outcome Measure  Help needed turning from your back to your side while in a flat bed without using bedrails?: None Help needed moving from lying on your back to sitting on the side of a flat bed without using bedrails?: None Help needed moving to and from a bed to a chair (including a wheelchair)?: A Little Help needed standing up from a  chair using your arms (e.g., wheelchair or bedside chair)?: A Little Help needed to walk in hospital room?: A Little Help needed climbing 3-5 steps with a railing? : A Little 6 Click Score: 20    End of Session Equipment Utilized During Treatment: Gait belt Activity Tolerance: Patient tolerated treatment well Patient left: in chair;with call bell/phone within reach;with family/visitor present Nurse Communication: Mobility status PT Visit Diagnosis: Unsteadiness on feet (R26.81);Other abnormalities of gait and mobility (R26.89);Muscle weakness (generalized) (M62.81);Difficulty in walking, not elsewhere classified (R26.2)     Time: [id number] PT Time Calculation (min) (ACUTE ONLY): 27 min  Charges:  $Therapeutic Activity: 23-37 mins                     Wyona Almas, PT, DPT Acute Rehabilitation Services Pager [phone] Office [phone]    Deno Etienne 07/11/2021, 4:13 PM

## 2021-07-11 NOTE — Progress Notes (Signed)
PROGRESS NOTE                                                                                                                                                                                                             Patient Demographics:    Martin Downs, is a 55 y.o. male, DOB - 01/21/1966, [id number]  Outpatient Primary MD for the patient is Lesleigh Noe, MD    LOS - 2  Admit date - 07/08/2021    Chief Complaint  Patient presents with   Emesis       Brief Narrative (HPI from H&P)  55 year old male with a history of type 2 diabetes on insulin, chronic methadone user, hyperlipidemia presents to the ER today with 4-day history of nausea and vomiting, he also had gradually progressive epigastric abdominal pain which is at times radiating to his back, for some reason he stopped taking his insulin for the last 4 weeks, in the ER he was diagnosed with DKA, new A. fib RVR along with CT evidence of peptic ulcer disease and duodenitis and he was admitted for further treatment, underwent EGD on 07/10/2021 showing severe esophagitis and gastritis with multiple nonbleeding ulcers.  Much better after IV PPI which will be continued today.  If stable oral PPI twice daily with home discharge on 07/12/2021 with outpatient GI and cardiology follow-up.   Subjective:   Patient in bed, appears comfortable, denies any headache, no fever, no chest pain or pressure, no shortness of breath , improved abdominal pain. No new focal weakness.   Assessment  & Plan :     DKA in a patient with DM type II due to insulin noncompliance - he has been strictly counseled on insulin compliance in the presence of his wife on 07/09/2021, DKA has resolved, have transitioned him to Lantus and sliding scale, improving.  Counseled on compliance.  Lab Results  Component Value Date   HGBA1C 11.5 (H) 07/09/2021    CBG (last 3)  Recent Labs    07/10/21 1823  07/10/21 2103 07/11/21 0635  GLUCAP 136* 191* 161*    2.  Dehydration with hyponatremia.  Due to #1 above.  Much improved after appropriate hydration with IV fluids.  3.  Severe epigastric pain due to possible peptic ulcer disease along with duodenitis as evident on CT scan.  Seen by  GI underwent EGD on 07/10/2021 showing severe esophagitis and gastritis with multiple nonbleeding ulcers, improved with IV PPI which will be continued for another 24 hours then switch him to oral PPI twice daily on 07/12/2021, H. pylori serology is pending, outpatient GI follow-up post discharge, will be discharged on twice daily PPI with no NSAIDs at home.  4.  Chronic pain and chronic narcotic use.  Continue home dose methadone.  Strictly counseled not to overuse narcotics and get oversedated.  As needed Narcan also added  5.  Dyslipidemia.  Home dose statin and fenofibrate resumed.  6. Mood Disorder.  On multiple psych medications at home and likely overmedicated, on much lower doses now with stable mentation, discussed with daughter who is a PA, discharged on present doses with outpatient psych follow-up.  7.  Paroxysmal atrial fibrillation.  Could be due to pain and dehydration, [place] vas 2 score is 1.  Stable TSH, echo with stable EF and no acute changes, on oral Cardizem, avoiding aspirin or anticoagulation for now due to #2 above, outpatient cardiology follow-up within a week of discharge.  Will be discharged on oral Cardizem.       Condition - Extremely Guarded  Family Communication  :  wife bedside 07/09/21, daughter on 07/11/21  Code Status :  Full  Consults  :  GI  PUD Prophylaxis : PPI   Procedures  :     TTE - 1. Left ventricular ejection fraction, by estimation, is 60 to 65%. The left ventricle has normal function. The left ventricle has no regional wall motion abnormalities. There is mild left ventricular hypertrophy. Left ventricular diastolic parameters were normal.  2. Right ventricular  systolic function is normal. The right ventricular size is mildly enlarged. Tricuspid regurgitation signal is inadequate for assessing PA pressure.  3. Septum hypermobile.  4. The mitral valve is grossly normal. No evidence of mitral valve regurgitation.  5. The aortic valve is grossly normal. Aortic valve regurgitation is not visualized.  6. Not well visualized.  EGD - Impression:     - Esophagogastric landmarks identified.- 1 cm hiatal hernia.                           - Severe / LA Grade D esophagitis. Biopsied to rule  out viral etiology but less likely.                           - Non-bleeding innumerable small gastric ulcers                            with a clean ulcer base (Forrest Class III).                           - Diffuse gastritis. Biopsied.                           - Numerous non-bleeding / clean based duodenal                            ulcers with no stigmata of bleeding.                           Overall, signficant ulcerations noted as above,  causing CT scan findings and the patient's pain in                            the setting of NSAID use. Severe ulceration of the                            esophagus noted, at risk for stricturing while this                            heals. Fortunately he has not had any GI bleeding                            from this. This will take some time to heal and for                            symptoms to resolve.   CT - 1. Moderate acute inflammation throughout the duodenum. Mild secondary inflammation of the distal CBD, and the right anterior pararenal space, but no bile duct enlargement. Top differential considerations are acute peptic ulcer disease and infectious duodenitis. No free air or free fluid. 2. No other acute or inflammatory process identified in the abdomen or pelvis. Normal appendix. 3. Punctate bilateral nephrolithiasis. Calcified coronary artery and Aortic Atherosclerosis (ICD10-I70.0).       Disposition Plan  :    Status is: Inpt    DVT Prophylaxis  :    enoxaparin (LOVENOX) injection 40 mg Start: 07/09/21 1800 SCDs Start: 07/09/21 0538   Lab Results  Component Value Date   PLT 173 07/11/2021    Diet :  Diet Order             DIET SOFT Room service appropriate? Yes; Fluid consistency: Nectar Thick  Diet effective now                    Inpatient Medications  Scheduled Meds:  diltiazem  60 mg Oral Q8H   divalproex  500 mg Oral Daily   DULoxetine  20 mg Oral BID   enoxaparin (LOVENOX) injection  40 mg Subcutaneous Q24H   fenofibrate  160 mg Oral Daily   insulin aspart  0-5 Units Subcutaneous QHS   insulin aspart  0-9 Units Subcutaneous TID WC   insulin glargine-yfgn  35 Units Subcutaneous BID   methadone  90 mg Oral Daily   [START ON 07/12/2021] pantoprazole  40 mg Intravenous Q12H   potassium chloride  40 mEq Oral Once   rosuvastatin  10 mg Oral Daily   sucralfate  1 g Oral Q6H   Continuous Infusions:  pantoprazole 8 mg/hr (07/10/21 2238)   PRN Meds:.diltiazem, ondansetron **OR** ondansetron (ZOFRAN) IV  Antibiotics  :    Anti-infectives (From admission, onward)    None        Time Spent in minutes  30   Lala Lund M.D on 07/11/2021 at 8:45 AM  To page go to [email or web address]   [facility] -  Office  [phone]  See all Orders from today for further details    Objective:   Vitals:   07/10/21 2102 07/11/21 0119 07/11/21 0328 07/11/21 0634  BP: 120/73 120/73 (!) 105/59 115/72  Pulse: 76 74 65 73  Resp: 18 18  18   Temp: 98.4 F (36.9  C) 98.6 F (37 C)  98.9 F (37.2 C)  TempSrc: Oral Oral  Oral  SpO2: 94% 97% 95% 96%  Weight:      Height:        Wt Readings from Last 3 Encounters:  07/09/21 86.2 kg  02/07/21 89.5 kg  12/12/20 87.7 kg     Intake/Output Summary (Last 24 hours) at 07/11/2021 0845 Last data filed at 07/11/2021 0800 Gross per 24 hour  Intake 1522.84 ml  Output 1300 ml  Net 222.84 ml      Physical Exam  Awake Alert, No new F.N deficits, Normal affect [place].AT,PERRAL Supple Neck, No JVD,   Symmetrical Chest wall movement, Good air movement bilaterally, CTAB RRR,No Gallops, Rubs or new Murmurs,  +ve B.Sounds, Abd Soft, No tenderness,   No Cyanosis, Clubbing or edema     Data Review:    CBC Recent Labs  Lab 07/08/21 2327 07/09/21 0015 07/10/21 0530 07/11/21 0557  WBC 17.6*  --  7.9 5.7  HGB 18.2* 18.4* 13.2 12.5*  HCT 52.0 54.0* 37.3* 34.8*  PLT 403*  --  199 173  MCV 85.0  --  83.1 82.3  MCH 29.7  --  29.4 29.6  MCHC 35.0  --  35.4 35.9  RDW 12.8  --  12.7 12.7  LYMPHSABS 0.9  --  1.5 1.4  MONOABS 1.5*  --  0.9 0.6  EOSABS 0.0  --  0.1 0.1  BASOSABS 0.0  --  0.0 0.0    Electrolytes Recent Labs  Lab 07/08/21 0445 07/08/21 2327 07/08/21 2327 07/08/21 2346 07/09/21 0002 07/09/21 0015 07/09/21 0600 07/10/21 0530 07/10/21 1127 07/11/21 0557  NA  --  123*   < >  --   --  122* 128* 131* 134* 136  K  --  5.1   < >  --   --  5.3* 4.0 3.4* 3.6 3.4*  CL  --  91*  --   --   --   --  102 99 99 100  CO2  --  11*  --   --   --   --  12* 23 26 31   GLUCOSE  --  539*  --   --   --   --  162* 167* 134* 115*  BUN  --  37*  --   --   --   --  28* 11 12 6   CREATININE  --  1.62*  --   --   --   --  1.06 0.87 0.76 0.67  CALCIUM  --  8.2*  --   --   --   --  7.4* 7.6* 7.8* 8.0*  AST  --  14*  --   --   --   --   --  11*  --  12*  ALT  --  17  --   --   --   --   --  11  --  12  ALKPHOS  --  65  --   --   --   --   --  37*  --  35*  BILITOT  --  1.7*  --   --   --   --   --  1.0  --  0.4  ALBUMIN  --  3.8  --   --   --   --   --  2.5*  --  2.6*  MG  --   --   --  2.6*  --   --   --  2.2  --  2.3  LATICACIDVEN 1.1  --   --   --  1.8  --   --   --   --   --   TSH  --   --   --   --   --   --  0.606  --   --   --   HGBA1C  --   --   --   --   --   --  11.5*  --   --   --    < > = values in this interval not displayed.     ------------------------------------------------------------------------------------------------------------------ No results for input(s): CHOL, HDL, LDLCALC, TRIG, CHOLHDL, LDLDIRECT in the last 72 hours.  Lab Results  Component Value Date   HGBA1C 11.5 (H) 07/09/2021    Recent Labs    07/09/21 0600  TSH 0.606   ------------------------------------------------------------------------------------------------------------------ ID Labs Recent Labs  Lab 07/08/21 0445 07/08/21 2327 07/09/21 0002 07/09/21 0600 07/10/21 0530 07/10/21 1127 07/11/21 0557  WBC  --  17.6*  --   --  7.9  --  5.7  PLT  --  403*  --   --  199  --  173  LATICACIDVEN 1.1  --  1.8  --   --   --   --   CREATININE  --  1.62*  --  1.06 0.87 0.76 0.67   Cardiac Enzymes No results for input(s): CKMB, TROPONINI, MYOGLOBIN in the last 168 hours.  Invalid input(s): CK     Radiology Reports DG Abdomen 1 View  Result Date: 07/09/2021 CLINICAL DATA:  Abdominal pain, nausea and vomiting.  Tachycardia. EXAM: PORTABLE CHEST - 1 VIEW; ABDOMEN - 1 VIEW COMPARISON:  09/23/2018. FINDINGS: The heart and mediastinal silhouette are within normal limits. Lung volumes are low. No consolidation, effusion, or pneumothorax. There is a nonobstructive bowel gas pattern. No free air is identified. Surgical clips are present in the right upper quadrant. Degenerative changes are present in the thoracolumbar spine and bilateral hips. IMPRESSION: 1. No acute process in the chest. 2. No bowel obstruction or free air. Electronically Signed   By: Thornell Sartorius M.D.   On: 07/09/2021 00:42   CT ABDOMEN PELVIS W CONTRAST  Result Date: 07/09/2021 CLINICAL DATA:  55 year old male with abdominal pain, nausea vomiting diarrhea for a few days. Uncontrolled diabetes. EXAM: CT ABDOMEN AND PELVIS WITH CONTRAST TECHNIQUE: Multidetector CT imaging of the abdomen and pelvis was performed using the standard protocol following bolus administration  of intravenous contrast. CONTRAST:  85mL ISOVUE-370 IOPAMIDOL (ISOVUE-370) INJECTION 76% COMPARISON:  CT Abdomen and Pelvis 04/16/2017. FINDINGS: Lower chest: Calcified coronary artery atherosclerosis. No cardiomegaly or pericardial effusion. Lower lung volumes, mild lung base atelectasis. Hepatobiliary: Chronically absent gallbladder. Liver enhancement within normal limits. No bile duct dilatation. Pancreas: Fatty atrophied throughout. No pancreatic ductal dilatation. Spleen: Negative. Adrenals/Urinary Tract: Normal adrenal glands. Secondary anterior right pararenal space inflammation, see duodenum below. The knees appear symmetric and nonobstructed. There is punctate bilateral nephrolithiasis. Symmetric renal contrast excretion on the delayed images. Negative ureters and bladder. Stomach/Bowel: Redundant large bowel with mild retained stool throughout. Small volume of fluid in the right colon. No convincing large bowel inflammation. Normal appendix on series 3, image 55. Decompressed and negative terminal ileum. No dilated small bowel. However, there is pronounced mucosal hyperenhancement and inflammation throughout the duodenum. See series 3, images 29 through 41 and coronal image 49. Indistinct duodenal wall with inflammatory stranding. And the distal CBD also appears to  be enhancing although is nondilated (coronal image 48). No regional free air or free fluid. Inflammatory stranding does involve the anterior right pararenal space. The stomach and ligament of Treitz have a more normal appearance. No other bowel inflammation identified. Vascular/Lymphatic: Mild Aortoiliac calcified atherosclerosis. Major arterial structures in the abdomen and pelvis are patent. Portal venous system is patent. No lymphadenopathy. Reproductive: Negative. Other: No pelvic free fluid. Musculoskeletal: T12 superior endplate Schmorl's node is new since 2018. No acute osseous abnormality identified. IMPRESSION: 1. Moderate acute  inflammation throughout the duodenum. Mild secondary inflammation of the distal CBD, and the right anterior pararenal space, but no bile duct enlargement. Top differential considerations are acute peptic ulcer disease and infectious duodenitis. No free air or free fluid. 2. No other acute or inflammatory process identified in the abdomen or pelvis. Normal appendix. 3. Punctate bilateral nephrolithiasis. Calcified coronary artery and Aortic Atherosclerosis (ICD10-I70.0). Electronically Signed   By: Genevie Ann M.D.   On: 07/09/2021 04:37   DG Chest Portable 1 View  Result Date: 07/09/2021 CLINICAL DATA:  Abdominal pain, nausea and vomiting.  Tachycardia. EXAM: PORTABLE CHEST - 1 VIEW; ABDOMEN - 1 VIEW COMPARISON:  09/23/2018. FINDINGS: The heart and mediastinal silhouette are within normal limits. Lung volumes are low. No consolidation, effusion, or pneumothorax. There is a nonobstructive bowel gas pattern. No free air is identified. Surgical clips are present in the right upper quadrant. Degenerative changes are present in the thoracolumbar spine and bilateral hips. IMPRESSION: 1. No acute process in the chest. 2. No bowel obstruction or free air. Electronically Signed   By: Brett Fairy M.D.   On: 07/09/2021 00:42   ECHOCARDIOGRAM COMPLETE  Result Date: 07/10/2021    ECHOCARDIOGRAM REPORT   Patient Name:   EZREAL MARSHBURN Date of Exam: 07/10/2021 Medical Rec #:  [id number]          Height:       69.0 in Accession #:    [id number]         Weight:       190.0 lb Date of Birth:  1966-04-07          BSA:          2.021 m Patient Age:    55 years           BP:           131/65 mmHg Patient Gender: M                  HR:           70 bpm. Exam Location:  Inpatient Procedure: 2D Echo, 3D Echo, Cardiac Doppler and Color Doppler Indications:    I48.91* Unspeicified atrial fibrillation  History:        Patient has no prior history of Echocardiogram examinations.                 Risk Factors:Hypertension, Diabetes,  Dyslipidemia, Current                 Smoker and Sleep Apnea. Methadone. Opiate abuse.  Sonographer:    Roseanna Rainbow RDCS Referring Phys: [place]  1. Left ventricular ejection fraction, by estimation, is 60 to 65%. The left ventricle has normal function. The left ventricle has no regional wall motion abnormalities. There is mild left ventricular hypertrophy. Left ventricular diastolic parameters were normal.  2. Right ventricular systolic function is normal. The right ventricular size is mildly enlarged. Tricuspid regurgitation signal is inadequate  for assessing PA pressure.  3. Septum hypermobile.  4. The mitral valve is grossly normal. No evidence of mitral valve regurgitation.  5. The aortic valve is grossly normal. Aortic valve regurgitation is not visualized.  6. Not well visualized. Comparison(s): No prior Echocardiogram. Conclusion(s)/Recommendation(s): With mild RV enlargement and hypermobile septum, consider repeat limited TTE with bubble study to assess for ASD/PFO. FINDINGS  Left Ventricle: Left ventricular ejection fraction, by estimation, is 60 to 65%. The left ventricle has normal function. The left ventricle has no regional wall motion abnormalities. The left ventricular internal cavity size was normal in size. There is  mild left ventricular hypertrophy. Left ventricular diastolic parameters were normal. Right Ventricle: The right ventricular size is mildly enlarged. No increase in right ventricular wall thickness. Right ventricular systolic function is normal. Tricuspid regurgitation signal is inadequate for assessing PA pressure. Left Atrium: Left atrial size was normal in size. Right Atrium: Right atrial size was normal in size. Pericardium: There is no evidence of pericardial effusion. Mitral Valve: The mitral valve is grossly normal. No evidence of mitral valve regurgitation. Tricuspid Valve: The tricuspid valve is grossly normal. Tricuspid valve regurgitation is not  demonstrated. Aortic Valve: The aortic valve is grossly normal. Aortic valve regurgitation is not visualized. Pulmonic Valve: The pulmonic valve was grossly normal. Pulmonic valve regurgitation is not visualized. Aorta: The aortic root and ascending aorta are structurally normal, with no evidence of dilitation. Venous: Not well visualized.  LEFT VENTRICLE PLAX 2D LVIDd:         4.85 cm      Diastology LVIDs:         3.10 cm      LV e' medial:    10.70 cm/s LV PW:         1.15 cm      LV E/e' medial:  11.3 LV IVS:        0.95 cm      LV e' lateral:   12.50 cm/s LVOT diam:     2.60 cm      LV E/e' lateral: 9.7 LV SV:         100 LV SV Index:   50 LVOT Area:     5.31 cm  LV Volumes (MOD) LV vol d, MOD A2C: 78.7 ml LV vol d, MOD A4C: 139.0 ml LV vol s, MOD A2C: 24.5 ml LV vol s, MOD A4C: 51.4 ml LV SV MOD A2C:     54.2 ml LV SV MOD A4C:     139.0 ml LV SV MOD BP:      68.7 ml RIGHT VENTRICLE             IVC RV Basal diam:  4.10 cm     IVC diam: 2.20 cm RV Mid diam:    4.00 cm RV S prime:     19.50 cm/s TAPSE (M-mode): 2.3 cm LEFT ATRIUM             Index        RIGHT ATRIUM           Index LA diam:        4.10 cm 2.03 cm/m   RA Area:     13.60 cm LA Vol (A2C):   43.2 ml 21.37 ml/m  RA Volume:   31.50 ml  15.58 ml/m LA Vol (A4C):   42.6 ml 21.07 ml/m LA Biplane Vol: 44.6 ml 22.06 ml/m  AORTIC VALVE LVOT Vmax:   106.00 cm/s LVOT Vmean:  68.100 cm/s LVOT VTI:    0.189 m  AORTA Ao Root diam: 3.80 cm Ao Asc diam:  3.40 cm MITRAL VALVE MV Area (PHT): 5.13 cm     SHUNTS MV Decel Time: 148 msec     Systemic VTI:  0.19 m MV E velocity: 121.00 cm/s  Systemic Diam: 2.60 cm MV A velocity: 61.30 cm/s MV E/A ratio:  1.97 Landscape architect signed by Phineas Inches Signature Date/Time: 07/10/2021/12:08:12 PM    Final

## 2021-07-11 NOTE — Plan of Care (Signed)

## 2021-07-11 NOTE — Progress Notes (Signed)
Notified by telemetry pt. is on afib rhythm. Checked pt. is sleeping, VS taken. Denies complaints. Seen MD night oncall Kakrakandy on the floor to let him know about pt. status. per MD monitor  for now. Then may give as early as 4am cardizem dose PO schedule at [facility]

## 2021-07-12 ENCOUNTER — Encounter (HOSPITAL_COMMUNITY): Payer: Self-pay | Admitting: Gastroenterology

## 2021-07-12 LAB — CBC WITH DIFFERENTIAL/PLATELET
Abs Immature Granulocytes: 0.05 10*3/uL (ref 0.00–0.07)
Basophils Absolute: 0 10*3/uL (ref 0.0–0.1)
Basophils Relative: 0 %
Eosinophils Absolute: 0.1 10*3/uL (ref 0.0–0.5)
Eosinophils Relative: 1 %
HCT: 39.4 % (ref 39.0–52.0)
Hemoglobin: 13.7 g/dL (ref 13.0–17.0)
Immature Granulocytes: 1 %
Lymphocytes Relative: 20 %
Lymphs Abs: 1.8 10*3/uL (ref 0.7–4.0)
MCH: 29.5 pg (ref 26.0–34.0)
MCHC: 34.8 g/dL (ref 30.0–36.0)
MCV: 84.7 fL (ref 80.0–100.0)
Monocytes Absolute: 0.8 10*3/uL (ref 0.1–1.0)
Monocytes Relative: 9 %
Neutro Abs: 6.4 10*3/uL (ref 1.7–7.7)
Neutrophils Relative %: 69 %
Platelets: 211 10*3/uL (ref 150–400)
RBC: 4.65 MIL/uL (ref 4.22–5.81)
RDW: 12.9 % (ref 11.5–15.5)
WBC: 9.2 10*3/uL (ref 4.0–10.5)
nRBC: 0 % (ref 0.0–0.2)

## 2021-07-12 LAB — COMPREHENSIVE METABOLIC PANEL
ALT: 11 U/L (ref 0–44)
AST: 13 U/L — ABNORMAL LOW (ref 15–41)
Albumin: 3 g/dL — ABNORMAL LOW (ref 3.5–5.0)
Alkaline Phosphatase: 39 U/L (ref 38–126)
Anion gap: 8 (ref 5–15)
BUN: 6 mg/dL (ref 6–20)
CO2: 31 mmol/L (ref 22–32)
Calcium: 8.4 mg/dL — ABNORMAL LOW (ref 8.9–10.3)
Chloride: 97 mmol/L — ABNORMAL LOW (ref 98–111)
Creatinine, Ser: 0.62 mg/dL (ref 0.61–1.24)
GFR, Estimated: >60 mL/min (ref >60)
Glucose, Bld: 107 mg/dL — ABNORMAL HIGH (ref 70–99)
Potassium: 4.3 mmol/L (ref 3.5–5.1)
Sodium: 136 mmol/L (ref 135–145)
Total Bilirubin: 0.3 mg/dL (ref 0.3–1.2)
Total Protein: 5.4 g/dL — ABNORMAL LOW (ref 6.5–8.1)

## 2021-07-12 LAB — GLUCOSE, CAPILLARY
Glucose-Capillary: 114 mg/dL — ABNORMAL HIGH (ref 70–99)
Glucose-Capillary: 150 mg/dL — ABNORMAL HIGH (ref 70–99)

## 2021-07-12 LAB — SURGICAL PATHOLOGY

## 2021-07-12 LAB — MAGNESIUM: Magnesium: 2.3 mg/dL (ref 1.7–2.4)

## 2021-07-12 MED ORDER — SUCRALFATE 1 G PO TABS: 1.0000 g | ORAL_TABLET | Freq: Three times a day (TID) | ORAL | 0 refills | Status: DC

## 2021-07-12 MED ORDER — DILTIAZEM HCL ER COATED BEADS 120 MG PO CP24: 120.0000 mg | ORAL_CAPSULE | Freq: Every day | ORAL | 0 refills | Status: DC

## 2021-07-12 MED ORDER — PANTOPRAZOLE SODIUM 40 MG PO TBEC: 40.0000 mg | DELAYED_RELEASE_TABLET | Freq: Two times a day (BID) | ORAL | 0 refills | Status: DC

## 2021-07-12 NOTE — Discharge Summary (Addendum)
Physician Discharge Summary  Martin Downs DXI:[id number] DOB: 09-Sep-1965 DOA: 07/08/2021  PCP: Lesleigh Noe, MD  Admit date: 07/08/2021 Discharge date: 07/12/2021  Admitted From: Home Disposition: Home  Recommendations for Outpatient Follow-up:  Follow up with PCP in 1 week with repeat CBC/BMP Outpatient follow-up with GI Will need outpatient cardiology evaluation and follow-up Follow up in ED if symptoms worsen or new appear   Home Health: No Equipment/Devices: None  Discharge Condition: Stable CODE STATUS: Full Diet recommendation: Heart healthy/carb modified  Brief/Interim Summary: 55 year old male with history of diabetes mellitus type 2 on insulin, chronic methadone user, hyperlipidemia presented with 4-day history of nausea and vomiting.  On presentation, he was found to have DKA along with new A. fib with RVR along with CT evidence of peptic ulcer disease and duodenitis.  He was started on IV PPI.  GI was consulted.  He underwent EGD on 07/18/2021 which showed severe esophagitis and gastritis with multiple nonbleeding duodenal ulcers.  His condition has subsequently improved and he is tolerating diet.  GI recommended outpatient follow-up.  He will be discharged home today with outpatient follow-up with PCP/GI.   Discharge Diagnoses:   DKA in a patient with diabetes mellitus type 2 due to insulin noncompliance -DKA has resolved with insulin drip and subsequently been transitioned to long-acting insulin.  Continue carb modified diet.  Continue compliance with insulin.  Continue long-acting insulin.  Outpatient follow-up with PCP.  Dehydration with hyponatremia -Due to above.  Much improved with IV hydration and insulin treatment  Peptic ulcer disease along with duodenitis causing severe epigastric pain -Status post EGD on 07/10/2021 showing severe esophagitis and gastritis with multiple nonbleeding ulcers.  Treated with IV PPI.  Symptoms have improved.  H. pylori  serology is pending. -Switch to oral PPI twice a day for at least 8 weeks and then once a day afterwards.  Patient will need repeat EGD in 6 to 8 weeks as an outpatient by GI.  Outpatient follow-up with GI.  Chronic pain/chronic narcotic use -Continue methadone.  Outpatient follow-up  Dyslipidemia -Continue home regimen  Mood disorder -Continue home regimen.  Outpatient follow-up with psychiatry  Paroxysmal A. Fib -Possibly due to pain and dehydration.  Echo with stable EF with no acute changes.  Continue oral Cardizem but changed to long-acting Cardizem.  Avoiding aspirin.  Will need outpatient evaluation and follow-up by cardiology. -Currently rate controlled.  Discharge Instructions  Discharge Instructions     Ambulatory referral to Gastroenterology   Complete by: As directed    Diet - low sodium heart healthy   Complete by: As directed    Increase activity slowly   Complete by: As directed       Allergies as of 07/12/2021       Reactions   Gemfibrozil Diarrhea        Medication List     STOP taking these medications    aspirin EC 81 MG tablet   clotrimazole 1 % cream Commonly known as: LOTRIMIN   escitalopram 20 MG tablet Commonly known as: Lexapro   fluticasone 50 MCG/ACT nasal spray Commonly known as: FLONASE   hydrOXYzine 25 MG capsule Commonly known as: VISTARIL   risperiDONE 0.5 MG tablet Commonly known as: RISPERDAL       TAKE these medications    BD Disp Needles 18G X 1-1/2" Misc Generic drug: NEEDLE (DISP) 18 G USE TO DRAW UP TESTOSTERONE What changed: See the new instructions.   BD Insulin Syringe U/F 31G X 5/16"  0.5 ML Misc Generic drug: Insulin Syringe-Needle U-100 USE 3 TIMES DAILY What changed:  how much to take how to take this when to take this additional instructions   B-D INSULIN SYRINGE 1CC/25GX1" 25G X 1" 1 ML Misc Generic drug: Insulin Syringe-Needle U-100 USE TO INJECT TESTOSTERONE WEEKLY What changed:  how  much to take how to take this when to take this   BD Pen Needle Nano 2nd Gen 32G X 4 MM Misc Generic drug: Insulin Pen Needle USE TO INJECT INSULIN 1 TIME DAILY AS INSTRUCTED. What changed: See the new instructions.   diltiazem 120 MG 24 hr capsule Commonly known as: Cardizem CD Take 1 capsule (120 mg total) by mouth daily.   divalproex 500 MG 24 hr tablet Commonly known as: DEPAKOTE ER Take 500 mg by mouth daily.   DULoxetine 20 MG capsule Commonly known as: CYMBALTA Take 20 mg by mouth 2 (two) times daily.   empagliflozin 25 MG Tabs tablet Commonly known as: Jardiance Take 1 tablet (25 mg total) by mouth daily.   fenofibrate 160 MG tablet Take 1 tablet (160 mg total) by mouth daily.   FreeStyle Libre 2 Reader Freedom 1 each by Does not apply route daily.   FreeStyle Libre 2 Sensor Misc 1 each by Does not apply route every 14 (fourteen) days.   HumaLOG KwikPen 200 UNIT/ML KwikPen Generic drug: insulin lispro Inject 10-14 units before the 3 meals of the day What changed:  how much to take how to take this when to take this additional instructions   Lantus SoloStar 100 UNIT/ML Solostar Pen Generic drug: insulin glargine Inject 60-80 units at bedtime   LORazepam 0.5 MG tablet Commonly known as: ATIVAN Take 0.5 mg by mouth at bedtime.   metFORMIN 1000 MG tablet Commonly known as: GLUCOPHAGE Take 1 tablet 2x a day What changed:  how much to take how to take this when to take this additional instructions   methadone 10 MG/ML solution Commonly known as: DOLOPHINE Take 90 mg by mouth daily.   ONE TOUCH ULTRA MINI w/Device Kit Use as directed to test blood sugar twice daily 250.00 What changed:  how much to take how to take this when to take this   OneTouch Delica Lancets 32G Misc USE TO TEST BLOOD SUGAR 4 TIMES DAILY-DX code E11.65 What changed:  how much to take how to take this when to take this   OneTouch Verio test strip Generic drug: glucose  blood Use to test blood sugar 4 times daily-Dx code E11.65 What changed:  how much to take how to take this when to take this   Ozempic (1 MG/DOSE) 4 MG/3ML Sopn Generic drug: Semaglutide (1 MG/DOSE) INJECT 1MG INTO THE SKIN ONCE A WEEK What changed: See the new instructions.   pantoprazole 40 MG tablet Commonly known as: Protonix Take 1 tablet (40 mg total) by mouth 2 (two) times daily before a meal.   QUEtiapine 50 MG tablet Commonly known as: SEROQUEL Take 100 mg by mouth at bedtime.   rosuvastatin 10 MG tablet Commonly known as: CRESTOR TAKE 1 TABLET BY MOUTH EVERY DAY What changed:  how much to take how to take this when to take this additional instructions   sucralfate 1 g tablet Commonly known as: CARAFATE Take 1 tablet (1 g total) by mouth 4 (four) times daily -  with meals and at bedtime.   SYRINGE-NEEDLE (DISP) 3 ML 18G X 1-1/2" 3 ML Misc Commonly known as: B-D 3CC LUER-LOK  SYR 18GX1-1/2 USE TO DRAW UP TESTOSTERONE What changed:  how much to take how to take this when to take this   Syringe/Needle (Disp) 18G X 1" 1 ML Misc Use to draw up testosterone What changed:  how much to take how to take this when to take this   testosterone cypionate 200 MG/ML injection Commonly known as: DEPOTESTOSTERONE CYPIONATE INJECT 0.25MLS INTO THE MUSCLE ONCE WEEKLY   traZODone 100 MG tablet Commonly known as: DESYREL Take 100 mg by mouth at bedtime. What changed: Another medication with the same name was removed. Continue taking this medication, and follow the directions you see here.          Follow-up Information     Lesleigh Noe, MD. Schedule an appointment as soon as possible for a visit in 1 week(s).   Specialty: Family Medicine Contact information: [place] [address] [phone]         Jerline Pain, MD. Schedule an appointment as soon as possible for a visit in 1 week(s).   Specialty: Cardiology Why: Afib Contact  information: [address] [place] [address] [phone]         Yetta Flock, MD. Schedule an appointment as soon as possible for a visit in 1 week(s).   Specialty: Gastroenterology Contact information: [address] [place] [address] [phone]         Lesleigh Noe, MD. Schedule an appointment as soon as possible for a visit in 1 week(s).   Specialty: Family Medicine Why: with repeat cbc/bmp Contact information: [place] [address] [phone]                Allergies  Allergen Reactions   Gemfibrozil Diarrhea    Consultations: GI   Procedures/Studies: DG Abdomen 1 View  Result Date: 07/09/2021 CLINICAL DATA:  Abdominal pain, nausea and vomiting.  Tachycardia. EXAM: PORTABLE CHEST - 1 VIEW; ABDOMEN - 1 VIEW COMPARISON:  09/23/2018. FINDINGS: The heart and mediastinal silhouette are within normal limits. Lung volumes are low. No consolidation, effusion, or pneumothorax. There is a nonobstructive bowel gas pattern. No free air is identified. Surgical clips are present in the right upper quadrant. Degenerative changes are present in the thoracolumbar spine and bilateral hips. IMPRESSION: 1. No acute process in the chest. 2. No bowel obstruction or free air. Electronically Signed   By: Brett Fairy M.D.   On: 07/09/2021 00:42   CT ABDOMEN PELVIS W CONTRAST  Result Date: 07/09/2021 CLINICAL DATA:  55 year old male with abdominal pain, nausea vomiting diarrhea for a few days. Uncontrolled diabetes. EXAM: CT ABDOMEN AND PELVIS WITH CONTRAST TECHNIQUE: Multidetector CT imaging of the abdomen and pelvis was performed using the standard protocol following bolus administration of intravenous contrast. CONTRAST:  66m ISOVUE-370 IOPAMIDOL (ISOVUE-370) INJECTION 76% COMPARISON:  CT Abdomen and Pelvis 04/16/2017. FINDINGS: Lower chest: Calcified coronary artery atherosclerosis. No cardiomegaly or pericardial  effusion. Lower lung volumes, mild lung base atelectasis. Hepatobiliary: Chronically absent gallbladder. Liver enhancement within normal limits. No bile duct dilatation. Pancreas: Fatty atrophied throughout. No pancreatic ductal dilatation. Spleen: Negative. Adrenals/Urinary Tract: Normal adrenal glands. Secondary anterior right pararenal space inflammation, see duodenum below. The knees appear symmetric and nonobstructed. There is punctate bilateral nephrolithiasis. Symmetric renal contrast excretion on the delayed images. Negative ureters and bladder. Stomach/Bowel: Redundant large bowel with mild retained stool throughout. Small volume of fluid in the right colon. No convincing large  bowel inflammation. Normal appendix on series 3, image 55. Decompressed and negative terminal ileum. No dilated small bowel. However, there is pronounced mucosal hyperenhancement and inflammation throughout the duodenum. See series 3, images 29 through 41 and coronal image 49. Indistinct duodenal wall with inflammatory stranding. And the distal CBD also appears to be enhancing although is nondilated (coronal image 48). No regional free air or free fluid. Inflammatory stranding does involve the anterior right pararenal space. The stomach and ligament of Treitz have a more normal appearance. No other bowel inflammation identified. Vascular/Lymphatic: Mild Aortoiliac calcified atherosclerosis. Major arterial structures in the abdomen and pelvis are patent. Portal venous system is patent. No lymphadenopathy. Reproductive: Negative. Other: No pelvic free fluid. Musculoskeletal: T12 superior endplate Schmorl's node is new since 2018. No acute osseous abnormality identified. IMPRESSION: 1. Moderate acute inflammation throughout the duodenum. Mild secondary inflammation of the distal CBD, and the right anterior pararenal space, but no bile duct enlargement. Top differential considerations are acute peptic ulcer disease and infectious  duodenitis. No free air or free fluid. 2. No other acute or inflammatory process identified in the abdomen or pelvis. Normal appendix. 3. Punctate bilateral nephrolithiasis. Calcified coronary artery and Aortic Atherosclerosis (ICD10-I70.0). Electronically Signed   By: Genevie Ann M.D.   On: 07/09/2021 04:37   DG Chest Portable 1 View  Result Date: 07/09/2021 CLINICAL DATA:  Abdominal pain, nausea and vomiting.  Tachycardia. EXAM: PORTABLE CHEST - 1 VIEW; ABDOMEN - 1 VIEW COMPARISON:  09/23/2018. FINDINGS: The heart and mediastinal silhouette are within normal limits. Lung volumes are low. No consolidation, effusion, or pneumothorax. There is a nonobstructive bowel gas pattern. No free air is identified. Surgical clips are present in the right upper quadrant. Degenerative changes are present in the thoracolumbar spine and bilateral hips. IMPRESSION: 1. No acute process in the chest. 2. No bowel obstruction or free air. Electronically Signed   By: Brett Fairy M.D.   On: 07/09/2021 00:42   ECHOCARDIOGRAM COMPLETE  Result Date: 07/10/2021    ECHOCARDIOGRAM REPORT   Patient Name:   Martin Downs Date of Exam: 07/10/2021 Medical Rec #:  [id number]          Height:       69.0 in Accession #:    [id number]         Weight:       190.0 lb Date of Birth:  Apr 27, 1966          BSA:          2.021 m Patient Age:    84 years           BP:           131/65 mmHg Patient Gender: M                  HR:           70 bpm. Exam Location:  Inpatient Procedure: 2D Echo, 3D Echo, Cardiac Doppler and Color Doppler Indications:    I48.91* Unspeicified atrial fibrillation  History:        Patient has no prior history of Echocardiogram examinations.                 Risk Factors:Hypertension, Diabetes, Dyslipidemia, Current                 Smoker and Sleep Apnea. Methadone. Opiate abuse.  Sonographer:    Roseanna Rainbow RDCS Referring Phys: [place]  1. Left ventricular ejection fraction, by  estimation, is 60 to 65%. The  left ventricle has normal function. The left ventricle has no regional wall motion abnormalities. There is mild left ventricular hypertrophy. Left ventricular diastolic parameters were normal.  2. Right ventricular systolic function is normal. The right ventricular size is mildly enlarged. Tricuspid regurgitation signal is inadequate for assessing PA pressure.  3. Septum hypermobile.  4. The mitral valve is grossly normal. No evidence of mitral valve regurgitation.  5. The aortic valve is grossly normal. Aortic valve regurgitation is not visualized.  6. Not well visualized. Comparison(s): No prior Echocardiogram. Conclusion(s)/Recommendation(s): With mild RV enlargement and hypermobile septum, consider repeat limited TTE with bubble study to assess for ASD/PFO. FINDINGS  Left Ventricle: Left ventricular ejection fraction, by estimation, is 60 to 65%. The left ventricle has normal function. The left ventricle has no regional wall motion abnormalities. The left ventricular internal cavity size was normal in size. There is  mild left ventricular hypertrophy. Left ventricular diastolic parameters were normal. Right Ventricle: The right ventricular size is mildly enlarged. No increase in right ventricular wall thickness. Right ventricular systolic function is normal. Tricuspid regurgitation signal is inadequate for assessing PA pressure. Left Atrium: Left atrial size was normal in size. Right Atrium: Right atrial size was normal in size. Pericardium: There is no evidence of pericardial effusion. Mitral Valve: The mitral valve is grossly normal. No evidence of mitral valve regurgitation. Tricuspid Valve: The tricuspid valve is grossly normal. Tricuspid valve regurgitation is not demonstrated. Aortic Valve: The aortic valve is grossly normal. Aortic valve regurgitation is not visualized. Pulmonic Valve: The pulmonic valve was grossly normal. Pulmonic valve regurgitation is not visualized. Aorta: The aortic root and  ascending aorta are structurally normal, with no evidence of dilitation. Venous: Not well visualized.  LEFT VENTRICLE PLAX 2D LVIDd:         4.85 cm      Diastology LVIDs:         3.10 cm      LV e' medial:    10.70 cm/s LV PW:         1.15 cm      LV E/e' medial:  11.3 LV IVS:        0.95 cm      LV e' lateral:   12.50 cm/s LVOT diam:     2.60 cm      LV E/e' lateral: 9.7 LV SV:         100 LV SV Index:   50 LVOT Area:     5.31 cm  LV Volumes (MOD) LV vol d, MOD A2C: 78.7 ml LV vol d, MOD A4C: 139.0 ml LV vol s, MOD A2C: 24.5 ml LV vol s, MOD A4C: 51.4 ml LV SV MOD A2C:     54.2 ml LV SV MOD A4C:     139.0 ml LV SV MOD BP:      68.7 ml RIGHT VENTRICLE             IVC RV Basal diam:  4.10 cm     IVC diam: 2.20 cm RV Mid diam:    4.00 cm RV S prime:     19.50 cm/s TAPSE (M-mode): 2.3 cm LEFT ATRIUM             Index        RIGHT ATRIUM           Index LA diam:        4.10 cm 2.03 cm/m   RA Area:  13.60 cm LA Vol (A2C):   43.2 ml 21.37 ml/m  RA Volume:   31.50 ml  15.58 ml/m LA Vol (A4C):   42.6 ml 21.07 ml/m LA Biplane Vol: 44.6 ml 22.06 ml/m  AORTIC VALVE LVOT Vmax:   106.00 cm/s LVOT Vmean:  68.100 cm/s LVOT VTI:    0.189 m  AORTA Ao Root diam: 3.80 cm Ao Asc diam:  3.40 cm MITRAL VALVE MV Area (PHT): 5.13 cm     SHUNTS MV Decel Time: 148 msec     Systemic VTI:  0.19 m MV E velocity: 121.00 cm/s  Systemic Diam: 2.60 cm MV A velocity: 61.30 cm/s MV E/A ratio:  1.97 Landscape architect signed by Phineas Inches Signature Date/Time: 07/10/2021/12:08:12 PM    Final     EGD Impression:     - Esophagogastric landmarks identified.- 1 cm hiatal hernia.                           - Severe / LA Grade D esophagitis. Biopsied to rule  out viral etiology but less likely.                           - Non-bleeding innumerable small gastric ulcers                            with a clean ulcer base (Forrest Class III).                           - Diffuse gastritis. Biopsied.                           - Numerous  non-bleeding / clean based duodenal                            ulcers with no stigmata of bleeding.                           Overall, signficant ulcerations noted as above,                            causing CT scan findings and the patient's pain in                            the setting of NSAID use. Severe ulceration of the                            esophagus noted, at risk for stricturing while this                            heals. Fortunately he has not had any GI bleeding                            from this. This will take some time to heal and for                            symptoms to resolve.  Subjective: Patient seen and examined at bedside.  Feels better and tolerating diet.  Wants to go home today.  No overnight fever, vomiting reported.  Discharge Exam: Vitals:   07/12/21 0558 07/12/21 0912  BP: 125/73 119/72  Pulse: 72 83  Resp: 18 16  Temp: 98.8 F (37.1 C) 98.9 F (37.2 C)  SpO2: 97% 97%    General: Pt is alert, awake, not in acute distress Cardiovascular: rate controlled, S1/S2 + Respiratory: bilateral decreased breath sounds at bases Abdominal: Soft, NT, ND, bowel sounds + Extremities: no edema, no cyanosis    The results of significant diagnostics from this hospitalization (including imaging, microbiology, ancillary and laboratory) are listed below for reference.     Microbiology: Recent Results (from the past 240 hour(s))  Resp Panel by RT-PCR (Flu A&B, Covid) Nasopharyngeal Swab     Status: None   Collection Time: 07/08/21 12:49 AM   Specimen: Nasopharyngeal Swab; Nasopharyngeal(NP) swabs in vial transport medium  Result Value Ref Range Status   SARS Coronavirus 2 by RT PCR NEGATIVE NEGATIVE Final    Comment: (NOTE) SARS-CoV-2 target nucleic acids are NOT DETECTED.  The SARS-CoV-2 RNA is generally detectable in upper respiratory specimens during the acute phase of infection. The lowest concentration of SARS-CoV-2 viral copies this assay can  detect is 138 copies/mL. A negative result does not preclude SARS-Cov-2 infection and should not be used as the sole basis for treatment or other patient management decisions. A negative result may occur with  improper specimen collection/handling, submission of specimen other than nasopharyngeal swab, presence of viral mutation(s) within the areas targeted by this assay, and inadequate number of viral copies(<138 copies/mL). A negative result must be combined with clinical observations, patient history, and epidemiological information. The expected result is Negative.  Fact Sheet for Patients:  EntrepreneurPulse.com.au  Fact Sheet for Healthcare Providers:  IncredibleEmployment.be  This test is no t yet approved or cleared by the [place] FDA and  has been authorized for detection and/or diagnosis of SARS-CoV-2 by FDA under an Emergency Use Authorization (EUA). This EUA will remain  in effect (meaning this test can be used) for the duration of the COVID-19 declaration under Section 564(b)(1) of the Act, 21 U.S.C.section 360bbb-3(b)(1), unless the authorization is terminated  or revoked sooner.       Influenza A by PCR NEGATIVE NEGATIVE Final   Influenza B by PCR NEGATIVE NEGATIVE Final    Comment: (NOTE) The Xpert Xpress SARS-CoV-2/FLU/RSV plus assay is intended as an aid in the diagnosis of influenza from Nasopharyngeal swab specimens and should not be used as a sole basis for treatment. Nasal washings and aspirates are unacceptable for Xpert Xpress SARS-CoV-2/FLU/RSV testing.  Fact Sheet for Patients: EntrepreneurPulse.com.au  Fact Sheet for Healthcare Providers: IncredibleEmployment.be  This test is not yet approved or cleared by the [place] FDA and has been authorized for detection and/or diagnosis of SARS-CoV-2 by FDA under an Emergency Use Authorization (EUA). This EUA will remain in  effect (meaning this test can be used) for the duration of the COVID-19 declaration under Section 564(b)(1) of the Act, 21 U.S.C. section 360bbb-3(b)(1), unless the authorization is terminated or revoked.  Performed at [facility], [place] [address] [place], [place] [address]      Labs: BNP (last 3 results) No results for input(s): BNP in the last 8760 hours. Basic Metabolic Panel: Recent Labs  Lab 07/08/21 2346 07/09/21 0015 07/09/21 0600 07/10/21 0530 07/10/21 1127 07/11/21 0557 07/12/21 0352  NA  --    < >  128* 131* 134* 136 136  K  --    < > 4.0 3.4* 3.6 3.4* 4.3  CL  --   --  102 99 99 100 97*  CO2  --   --  12* _0 GLUCOSE  --   --  162* 167* 134* 115* 107*  BUN  --   --  28* _1 CREATININE  --   --  1.06 0.87 0.76 0.67 0.62  CALCIUM  --   --  7.4* 7.6* 7.8* 8.0* 8.4*  MG 2.6*  --   --  2.2  --  2.3 2.3   < > = values in this interval not displayed.   Liver Function Tests: Recent Labs  Lab 07/08/21 2327 07/10/21 0530 07/11/21 0557 07/12/21 0352  AST 14* 11* 12* 13*  ALT _2 ALKPHOS 65 37* 35* 39  BILITOT 1.7* 1.0 0.4 0.3  PROT 6.7 4.3* 4.6* 5.4*  ALBUMIN 3.8 2.5* 2.6* 3.0*   Recent Labs  Lab 07/08/21 2327  LIPASE 62*   No results for input(s): AMMONIA in the last 168 hours. CBC: Recent Labs  Lab 07/08/21 2327 07/09/21 0015 07/10/21 0530 07/11/21 0557 07/12/21 0352  WBC 17.6*  --  7.9 5.7 9.2  NEUTROABS 15.1*  --  5.3 3.6 6.4  HGB 18.2* 18.4* 13.2 12.5* 13.7  HCT 52.0 54.0* 37.3* 34.8* 39.4  MCV 85.0  --  83.1 82.3 84.7  PLT 403*  --  199 173 211   Cardiac Enzymes: No results for input(s): CKTOTAL, CKMB, CKMBINDEX, TROPONINI in the last 168 hours. BNP: Invalid input(s): POCBNP CBG: Recent Labs  Lab 07/11/21 0635 07/11/21 1249 07/11/21 1634 07/11/21 2057 07/12/21 0639  GLUCAP 161* 204* 179* 225* 114*   D-Dimer No results for input(s): DDIMER in the last 72 hours. Hgb A1c No results for input(s):  HGBA1C in the last 72 hours. Lipid Profile No results for input(s): CHOL, HDL, LDLCALC, TRIG, CHOLHDL, LDLDIRECT in the last 72 hours. Thyroid function studies No results for input(s): TSH, T4TOTAL, T3FREE, THYROIDAB in the last 72 hours.  Invalid input(s): FREET3 Anemia work up No results for input(s): VITAMINB12, FOLATE, FERRITIN, TIBC, IRON, RETICCTPCT in the last 72 hours. Urinalysis    Component Value Date/Time   COLORURINE STRAW (A) 07/08/2021 2327   APPEARANCEUR CLEAR 07/08/2021 2327   LABSPEC 1.026 07/08/2021 2327   PHURINE 6.0 07/08/2021 2327   GLUCOSEU >=500 (A) 07/08/2021 2327   HGBUR NEGATIVE 07/08/2021 2327   BILIRUBINUR NEGATIVE 07/08/2021 2327   KETONESUR 80 (A) 07/08/2021 2327   PROTEINUR NEGATIVE 07/08/2021 2327   NITRITE NEGATIVE 07/08/2021 2327   LEUKOCYTESUR NEGATIVE 07/08/2021 2327   Sepsis Labs Invalid input(s): PROCALCITONIN,  WBC,  LACTICIDVEN Microbiology Recent Results (from the past 240 hour(s))  Resp Panel by RT-PCR (Flu A&B, Covid) Nasopharyngeal Swab     Status: None   Collection Time: 07/08/21 12:49 AM   Specimen: Nasopharyngeal Swab; Nasopharyngeal(NP) swabs in vial transport medium  Result Value Ref Range Status   SARS Coronavirus 2 by RT PCR NEGATIVE NEGATIVE Final    Comment: (NOTE) SARS-CoV-2 target nucleic acids are NOT DETECTED.  The SARS-CoV-2 RNA is generally detectable in upper respiratory specimens during the acute phase of infection. The lowest concentration of SARS-CoV-2 viral copies this assay can detect is 138 copies/mL. A negative result does not preclude SARS-Cov-2 infection and should not be used as the sole basis for treatment or other patient management  decisions. A negative result may occur with  improper specimen collection/handling, submission of specimen other than nasopharyngeal swab, presence of viral mutation(s) within the areas targeted by this assay, and inadequate number of viral copies(<138 copies/mL). A  negative result must be combined with clinical observations, patient history, and epidemiological information. The expected result is Negative.  Fact Sheet for Patients:  EntrepreneurPulse.com.au  Fact Sheet for Healthcare Providers:  IncredibleEmployment.be  This test is no t yet approved or cleared by the [place] FDA and  has been authorized for detection and/or diagnosis of SARS-CoV-2 by FDA under an Emergency Use Authorization (EUA). This EUA will remain  in effect (meaning this test can be used) for the duration of the COVID-19 declaration under Section 564(b)(1) of the Act, 21 U.S.C.section 360bbb-3(b)(1), unless the authorization is terminated  or revoked sooner.       Influenza A by PCR NEGATIVE NEGATIVE Final   Influenza B by PCR NEGATIVE NEGATIVE Final    Comment: (NOTE) The Xpert Xpress SARS-CoV-2/FLU/RSV plus assay is intended as an aid in the diagnosis of influenza from Nasopharyngeal swab specimens and should not be used as a sole basis for treatment. Nasal washings and aspirates are unacceptable for Xpert Xpress SARS-CoV-2/FLU/RSV testing.  Fact Sheet for Patients: EntrepreneurPulse.com.au  Fact Sheet for Healthcare Providers: IncredibleEmployment.be  This test is not yet approved or cleared by the [place] FDA and has been authorized for detection and/or diagnosis of SARS-CoV-2 by FDA under an Emergency Use Authorization (EUA). This EUA will remain in effect (meaning this test can be used) for the duration of the COVID-19 declaration under Section 564(b)(1) of the Act, 21 U.S.C. section 360bbb-3(b)(1), unless the authorization is terminated or revoked.  Performed at [facility], [place] [address] [place], [place] [address]      Time coordinating discharge: 35 minutes  SIGNED:   Aline August, MD  [facility] 07/12/2021, 10:36 AM

## 2021-07-12 NOTE — Progress Notes (Signed)
Occupational Therapy Treatment Patient Details Name: Martin Downs MRN: [id number] DOB: 1966-01-17 Today's Date: 07/12/2021   History of present illness Pt is a 55 y.o. male who presented 07/08/21 with x4 day hx of nausea and emesis. Pt had not taken his meds for 4 days and was having elevated blood sugars at home, per chart. Pt presented tachycardic with HR of 165. Pt admitted with DKA, Afib with RVR, and CT evidence of peptic ulcer disease and duodenitis. PMH: type 2 diabetes on insulin, chronic methadone user, hyperlipidemia   OT comments  Patient continues to make steady progress towards goals in skilled OT session. Patient's session encompassed  education with regard to medication management and consistent routine for ADLs upon discharge. Therapist recommending pill sorter and reminder on smart phone to check insulin to promote increased adherence. Patient in agreement. Therapist and patient also discussing consistent routine at home to avoid feeling depressed, with therapist encouraging patient to follow up with family doctor if symptoms persist. Discharge remains appropriate, therapy will continue to follow.    Recommendations for follow up therapy are one component of a multi-disciplinary discharge planning process, led by the attending physician.  Recommendations may be updated based on patient status, additional functional criteria and insurance authorization.    Follow Up Recommendations  No OT follow up    Assistance Recommended at Discharge Intermittent Supervision/Assistance  Equipment Recommendations  BSC/3in1;Tub/shower seat    Recommendations for Other Services      Precautions / Restrictions Precautions Precautions: Fall Precaution Comments: monitor HR and BP Restrictions Weight Bearing Restrictions: No       Mobility Bed Mobility               General bed mobility comments: Sitting EOB upon arrival    Transfers                          Balance                                           ADL either performed or assessed with clinical judgement   ADL                                       Functional mobility during ADLs: Min guard      Extremity/Trunk Assessment              Vision       Perception     Praxis      Cognition Arousal/Alertness: Awake/alert Behavior During Therapy: WFL for tasks assessed/performed Overall Cognitive Status: Within Functional Limits for tasks assessed                                 General Comments: Improved cognition in session, able to problem solve medication routine for safe discharge home          Exercises     Shoulder Instructions       General Comments      Pertinent Vitals/ Pain       Pain Assessment: No/denies pain  Home Living  Prior Functioning/Environment              Frequency  Min 2X/week        Progress Toward Goals  OT Goals(current goals can now be found in the care plan section)  Progress towards OT goals: Progressing toward goals  Acute Rehab OT Goals Patient Stated Goal: return home OT Goal Formulation: With patient Time For Goal Achievement: 07/24/21 Potential to Achieve Goals: Good  Plan Discharge plan remains appropriate    Co-evaluation                 AM-PAC OT "6 Clicks" Daily Activity     Outcome Measure   Help from another person eating meals?: None Help from another person taking care of personal grooming?: None Help from another person toileting, which includes using toliet, bedpan, or urinal?: A Little Help from another person bathing (including washing, rinsing, drying)?: A Little Help from another person to put on and taking off regular upper body clothing?: None Help from another person to put on and taking off regular lower body clothing?: None 6 Click Score: 22    End of Session     OT Visit Diagnosis: Unsteadiness on feet (R26.81)   Activity Tolerance Patient tolerated treatment well   Patient Left in bed;with call bell/phone within reach   Nurse Communication Mobility status        Time: 8003-4917 OT Time Calculation (min): 10 min  Charges: OT General Charges $OT Visit: 1 Visit OT Treatments $Self Care/Home Management : 8-22 mins  Pollyann Glen E. Dakiyah Heinke, COTA/L Acute Rehabilitation Services [phone]   Cherlyn Cushing 07/12/2021, 11:48 AM

## 2021-07-12 NOTE — Plan of Care (Signed)

## 2021-07-12 NOTE — Care Management (Signed)
Spoke w patient over the phone, declined DME needs. No other TOC needs identified for DC

## 2021-07-12 NOTE — TOC Transition Note (Signed)
Transition of Care [facility]) - CM/SW Discharge Note   Patient Details  Name: Zahi Plaskett MRN: [id number] Date of Birth: [birth year]  Transition of [facility]) CM/SW Contact:  Tom-Johnson, Hershal Coria, RN Phone Number: 07/12/2021, 2:00 PM   Clinical Narrative:    Patient is scheduled for discharge today. Admitted with DKA. Patient is from home with spouse. States he has filed for disability and awaits eligibility. Does not have DME's at home and declines recommendations. Patient also declines home health PT, states his wife and son that lives close by and works from home will assist with anything he needs. Family to transport at discharge. No further TOC needs noted.   Final next level of care: Home/Self Care Barriers to Discharge: Barriers Resolved   Patient Goals and CMS Choice Patient states their goals for this hospitalization and ongoing recovery are:: To go home CMS Medicare.gov Compare Post Acute Care list provided to:: Patient Choice offered to / list presented to : Patient  Discharge Placement                       Discharge Plan and Services                DME Arranged: Patient refused services DME Agency: NA       HH Arranged: Patient Refused HH HH Agency: NA        Social Determinants of Health (SDOH) Interventions     Readmission Risk Interventions No flowsheet data found.

## 2021-07-12 NOTE — Progress Notes (Signed)
Discharge instructions given to spouse.  Patient discharged at this time in stable condition.

## 2021-07-12 NOTE — Plan of Care (Signed)
All goals met. Patient discharged at time with discharge instructions given to spouse

## 2021-07-13 ENCOUNTER — Telehealth: Payer: Self-pay

## 2021-07-13 ENCOUNTER — Other Ambulatory Visit: Payer: Self-pay

## 2021-07-13 MED ORDER — ACYCLOVIR 400 MG PO TABS: 400.0000 mg | ORAL_TABLET | Freq: Three times a day (TID) | ORAL | 0 refills | Status: AC

## 2021-07-13 NOTE — Telephone Encounter (Signed)
Transition Care Management Unsuccessful Follow-up Telephone Call  Date of discharge and from where:  07/12/21 from [facility]  Attempts:  1st Attempt  Reason for unsuccessful TCM follow-up call:  Left voice message

## 2021-07-14 ENCOUNTER — Telehealth: Payer: Self-pay

## 2021-07-14 ENCOUNTER — Telehealth: Payer: Self-pay | Admitting: Internal Medicine

## 2021-07-14 DIAGNOSIS — E1310 Other specified diabetes mellitus with ketoacidosis without coma: Secondary | ICD-10-CM

## 2021-07-14 MED ORDER — ONETOUCH DELICA LANCETS 33G MISC: 3 refills | Status: DC

## 2021-07-14 MED ORDER — ONETOUCH VERIO VI STRP: ORAL_STRIP | 0 refills | Status: DC

## 2021-07-14 MED ORDER — ONETOUCH DELICA LANCETS 33G MISC: 0 refills | Status: DC

## 2021-07-14 MED ORDER — ONETOUCH VERIO VI STRP: ORAL_STRIP | 2 refills | Status: DC

## 2021-07-14 NOTE — Telephone Encounter (Signed)
MEDICATION: OneTouch Delica Lancets 33G MISC AND glucose blood (ONETOUCH VERIO) test strip   PHARMACY:   [id number] [address], [address] Phone:  [phone]  Fax:  [phone]      HAS THE PATIENT CONTACTED THEIR PHARMACY?  No  IS THIS A 90 DAY SUPPLY : Yes, if possible   IS PATIENT OUT OF MEDICATION: Yes-out of tests trips  IF NOT; HOW MUCH IS LEFT:   LAST APPOINTMENT DATE: @11 /09/2020  NEXT APPOINTMENT DATE:@1 /12/2021  DO WE HAVE YOUR PERMISSION TO LEAVE A DETAILED MESSAGE?:Yes  OTHER COMMENTS:    **Let patient know to contact pharmacy at the end of the day to make sure medication is ready. **  ** Please notify patient to allow 48-72 hours to process**  **Encourage patient to contact the pharmacy for refills or they can request refills through [facility]**

## 2021-07-14 NOTE — Telephone Encounter (Signed)
Transition Care Management Follow-up Telephone Call Date of discharge and from where: 07/12/21 from [facility] How have you been since you were released from the hospital? Patient states still feeling light headed Any questions or concerns? No  Items Reviewed: Did the pt receive and understand the discharge instructions provided? Yes  Medications obtained and verified? Yes  Other? No  Any new allergies since your discharge? No  Dietary orders reviewed? Yes Do you have support at home? Yes   Home Care and Equipment/Supplies: Were home health services ordered? no If so, what is the name of the agency? N/A  Has the agency set up a time to come to the patient's home? not applicable Were any new equipment or medical supplies ordered?  No What is the name of the medical supply agency? N/A Were you able to get the supplies/equipment? not applicable Do you have any questions related to the use of the equipment or supplies? No  Functional Questionnaire: (I = Independent and D = Dependent) ADLs: I  Bathing/Dressing- I  Meal Prep- I  Eating- I  Maintaining continence- I  Transferring/Ambulation- I  Managing Meds- I  Follow up appointments reviewed:  PCP Hospital f/u appt confirmed? Yes  Scheduled to see K. Clark NP on 07/18/21 @ 2:00pm. Specialist Hospital f/u appt confirmed? Yes  Scheduled to see Cardiologist on 07/27/21 @ 8:40am. Are transportation arrangements needed? No  If their condition worsens, is the pt aware to call PCP or go to the Emergency Dept.? Yes Was the patient provided with contact information for the PCP's office or ED? Yes Was to pt encouraged to call back with questions or concerns? Yes

## 2021-07-14 NOTE — Telephone Encounter (Signed)
Rx sent to preferred pharmacy.

## 2021-07-18 ENCOUNTER — Telehealth: Payer: Self-pay | Admitting: Gastroenterology

## 2021-07-18 ENCOUNTER — Encounter: Payer: Self-pay | Admitting: Gastroenterology

## 2021-07-18 ENCOUNTER — Ambulatory Visit: Payer: 59 | Admitting: Primary Care

## 2021-07-18 ENCOUNTER — Encounter: Payer: Self-pay | Admitting: Primary Care

## 2021-07-18 ENCOUNTER — Other Ambulatory Visit: Payer: Self-pay

## 2021-07-18 VITALS — BP 120/62 | HR 83 | Temp 97.7°F | Ht 69.0 in | Wt 175.0 lb

## 2021-07-18 DIAGNOSIS — E1165 Type 2 diabetes mellitus with hyperglycemia: Secondary | ICD-10-CM | POA: Diagnosis not present

## 2021-07-18 DIAGNOSIS — K253 Acute gastric ulcer without hemorrhage or perforation: Secondary | ICD-10-CM

## 2021-07-18 DIAGNOSIS — E1110 Type 2 diabetes mellitus with ketoacidosis without coma: Secondary | ICD-10-CM

## 2021-07-18 DIAGNOSIS — I4891 Unspecified atrial fibrillation: Secondary | ICD-10-CM | POA: Diagnosis not present

## 2021-07-18 LAB — BASIC METABOLIC PANEL
BUN: 11 mg/dL (ref 6–23)
CO2: 34 mEq/L — ABNORMAL HIGH (ref 19–32)
Calcium: 9.9 mg/dL (ref 8.4–10.5)
Chloride: 97 mEq/L (ref 96–112)
Creatinine, Ser: 0.76 mg/dL (ref 0.40–1.50)
GFR: 99.98 mL/min (ref >60.00)
Glucose, Bld: 93 mg/dL (ref 70–99)
Potassium: 4.6 mEq/L (ref 3.5–5.1)
Sodium: 137 mEq/L (ref 135–145)

## 2021-07-18 LAB — CBC
HCT: 45.5 % (ref 39.0–52.0)
Hemoglobin: 14.6 g/dL (ref 13.0–17.0)
MCHC: 32.1 g/dL (ref 30.0–36.0)
MCV: 88.8 fl (ref 78.0–100.0)
Platelets: 367 10*3/uL (ref 150.0–400.0)
RBC: 5.13 Mil/uL (ref 4.22–5.81)
RDW: 13.9 % (ref 11.5–15.5)
WBC: 14.1 10*3/uL — ABNORMAL HIGH (ref 4.0–10.5)

## 2021-07-18 MED ORDER — FREESTYLE LIBRE 3 SENSOR MISC: 1.0000 | 4 refills | Status: DC

## 2021-07-18 MED ORDER — LANTUS SOLOSTAR 100 UNIT/ML ~~LOC~~ SOPN: PEN_INJECTOR | SUBCUTANEOUS | 0 refills | Status: DC

## 2021-07-18 NOTE — Telephone Encounter (Signed)
Patient's wife called stating the Acyclovir was making her husband sick (vomiting).  She wanted to know if there was an alternative medicine he could take.  Please call patient.  Thank you.

## 2021-07-18 NOTE — Assessment & Plan Note (Signed)
New diagnosis during recent hospital stay, likely secondary to dehydration from DKA.  Rate and rhythm regular today. Continue diltiazem 120 mg daily. Follow-up with cardiology as scheduled.

## 2021-07-18 NOTE — Telephone Encounter (Signed)
Dr Adela Lank prescribed the acyclovir. The pt had EGD while in the hospital.  He (the pt) also sent a My Chart message with the same complaint. I sent it to Dr Adela Lank for review as well as Dr Christella Hartigan.

## 2021-07-18 NOTE — Patient Instructions (Addendum)
Resume Lantus insulin. Inject 10 units of Lantus insulin at bedtime for diabetes. Increase by 2 units every 3 days for blood sugars consistently above 150.  Continue your other diabetes medications.  Pick up the FreeStyle Libre 3 Sensor to check your blood sugars.  Stop by the lab prior to leaving today. I will notify you of your results once received.   Continue pantoprazole 40 mg twice daily for the stomach ulcer. Continue diltiazem CD 120 mg daily for heart rate.  Hold the acyclovir for now, notify Dr. Adela Lank.   Follow up with GI, your diabetes doctor, and cardiologist.   It was a pleasure meeting you!

## 2021-07-18 NOTE — Progress Notes (Signed)
Subjective:    Patient ID: Martin Downs, male    DOB: November 09, 1965, 55 y.o.   MRN: [id number]  HPI  Martin Downs is a very pleasant 55 y.o. male patient of Dr. Selena Batten with a history of atrial fibrillation with RVR, gastric ulcer with hemorrhage, sleep apnea, chronic pain on methadone, uncontrolled type 2 diabetes, tobacco abuse, hypogonadism managed on testosterone who presents today for transition care management hospital follow up.   He presented to [facility] on 07/08/21 for several days of nausea and vomiting. His work up was consistent for DKA, was provided with 3 liters of IV fluids, treated with insulin drip, and diltiazem given atrial fibrillation with RVR. He admitted non compliance to his insulin for 4 days. He was admitted for further evaluation.   During hs hospital stay he underwent CT abdomen/pelvis which revealed duodenitis with peptic ulcer disease. He was placed on IV PPI and GI consulted who recommended upper endoscopy. Endoscopy revealed severe esophagitis and gastritis with multiple non bleeding ulcers. He admitted to recurrent NSAID use, but not daily.    Glucose readings improved, GI symptoms improved so he was discharged home on 07/22/21 with recommendations for GI follow up, PCP follow up, repeat CBC/BMP, outpatient cardiology evaluation, Rx for diltiazem CD 120 mg, pantoprazole 40 mg BID x 8 weeks then daily thereafter.   Today he endorses compliance to his diltiazem CD 120 mg daily, pantoprazole 40 mg BID, and prescribed diabetes regimen.    He is scheduled to see cardiology on 07/27/21, GI on 08/07/21.    He is was prescribed acyclovir 400 mg TID per Dr. Adela Lank, has caused nausea, vomiting, and headaches since beginning this regimen and each time he takes a dose. He's not sure why this was prescribed.    He is checking glucose readings 2-3 times daily which range high 100's, some low 200's. He is compliant to metformin 1000 mg BID, Jardiance 25 mg, Ozempic 1 mg  weekly, Humalog 14 units TID with meals.    He ran out of Lantus 60 units several months ago. Insurance would not cover [place] any longer. He admits not checking glucose regularly. He does follow with endocrinology, next visit is scheduled for January 2023.   He is trying to increase water intake. He does have residual abdominal pain, improving.      Review of Systems  Constitutional:  Negative for fever.  Gastrointestinal:  Positive for abdominal pain, constipation, nausea and vomiting. Negative for blood in stool.  Neurological:  Negative for dizziness.        Past Medical History:  Diagnosis Date   Acute gangrenous cholecystitis s/p cholecystectomy 05/30/2016 05/31/2016   Anxiety    Borderline diabetes    Cholecystitis 05/30/2016   Chronic pain    Common bile duct repair with T-Tube 11/192017 05/31/2016   Depression    Diabetes mellitus without complication (HCC)    Hyperlipidemia    Narcotic dependence (HCC)    Sleep apnea    no cpap    Social History   Socioeconomic History   Marital status: Married    Spouse name: Amy   Number of children: 2   Years of education: Not on file   Highest education level: High school graduate  Occupational History   Occupation: Sports administrator: UNEMPLOYED  Tobacco Use   Smoking status: Never   Smokeless tobacco: Former    Types: Snuff    Quit date: 03/30/2020  Vaping Use   Vaping Use: Never  used  Substance and Sexual Activity   Alcohol use: No    Comment: quit 2009   Drug use: Not Currently   Sexual activity: Yes    Birth control/protection: Post-menopausal  Other Topics Concern   Not on file  Social History Narrative   11/23/19   From: the area   Living: with wife Amy, high school sweet hearts   Work: Music therapist, works Warehouse manager for a [employer]      Family: 2 children - Wellsite geologist and Sport and exercise psychologist - grown and out of the house      Enjoys: not sure, works too much, watch TV      Exercise: walking at his  job - bending/lifting   Diet: tries to do diabetic diet - but only partially, low appetite      Safety   Seat belts: Yes    Guns: declined   Safe in relationships: Yes    Social Determinants of Radio broadcast assistant Strain: Not on file  Food Insecurity: Not on file  Transportation Needs: Not on file  Physical Activity: Not on file  Stress: Not on file  Social Connections: Not on file  Intimate Partner Violence: Not on file    Past Surgical History:  Procedure Laterality Date   BIOPSY  07/10/2021   Procedure: BIOPSY;  Surgeon: Yetta Flock, MD;  Location: [place];  Service: Gastroenterology;;   CHOLECYSTECTOMY N/A 05/30/2016   Procedure: LAPAROSCOPIC CHOLECYSTECTOMY WITH INTRAOPERATIVE CHOLANGIOGRAM;  Surgeon: Donnie Mesa, MD;  Location: [place];  Service: General;  Laterality: N/A;   CHOLECYSTECTOMY N/A 05/30/2016   Procedure: OPEN COMMON BILE DUCT EXPLORATION; INSERTION OF T-TUBE;  Surgeon: Donnie Mesa, MD;  Location: [place];  Service: General;  Laterality: N/A;   ESOPHAGOGASTRODUODENOSCOPY (EGD) WITH PROPOFOL N/A 07/10/2021   Procedure: ESOPHAGOGASTRODUODENOSCOPY (EGD) WITH PROPOFOL;  Surgeon: Yetta Flock, MD;  Location: [place];  Service: Gastroenterology;  Laterality: N/A;   removal of bone spur Bilateral 1995   elbps   TOOTH EXTRACTION  2956   UMBILICAL HERNIA REPAIR N/A 05/30/2016   Procedure: HERNIA REPAIR UMBILICAL ADULT;  Surgeon: Donnie Mesa, MD;  Location: [facility];  Service: General;  Laterality: N/A;    Family History  Problem Relation Age of Onset   Hypertension Mother    Cancer Father        not sure of type   Diabetes Father    Hypertension Father    Heart attack Father 79   Diabetes Brother    Hypertension Brother    Heart attack Brother        around 43 years of age   Kidney cancer Brother        stage 4-1 kidney removed   Colon cancer Neg Hx     Allergies  Allergen Reactions   Gemfibrozil Diarrhea    Current  Outpatient Medications on File Prior to Visit  Medication Sig Dispense Refill   acyclovir (ZOVIRAX) 400 MG tablet Take 1 tablet (400 mg total) by mouth 3 (three) times daily for 10 days. 30 tablet 0   BD DISP NEEDLES 18G X 1-1/2" MISC USE TO DRAW UP TESTOSTERONE (Patient taking differently: 1 each by Other route See admin instructions. USE TO DRAW UP TESTOSTERONE) 4 each 24   BD INSULIN SYRINGE U/F 31G X 5/16" 0.5 ML MISC USE 3 TIMES DAILY (Patient taking differently: 1 each by Other route See admin instructions. USE 3 TIMES DAILY) 100 each 0   BD PEN NEEDLE NANO  2ND GEN 32G X 4 MM MISC USE TO INJECT INSULIN 1 TIME DAILY AS INSTRUCTED. (Patient taking differently: 1 each by Other route See admin instructions. USE TO INJECT INSULIN 1 TIME DAILY AS INSTRUCTED.) 100 each 3   Blood Glucose Monitoring Suppl (ONE TOUCH ULTRA MINI) W/DEVICE KIT Use as directed to test blood sugar twice daily 250.00 (Patient taking differently: 1 each by Other route See admin instructions. Use as directed to test blood sugar twice daily 250.00) 1 each 0   diltiazem (CARDIZEM CD) 120 MG 24 hr capsule Take 1 capsule (120 mg total) by mouth daily. 30 capsule 0   divalproex (DEPAKOTE ER) 500 MG 24 hr tablet Take 500 mg by mouth daily.     DULoxetine (CYMBALTA) 20 MG capsule Take 20 mg by mouth 2 (two) times daily.     empagliflozin (JARDIANCE) 25 MG TABS tablet Take 1 tablet (25 mg total) by mouth daily. 90 tablet 3   glucose blood (ONETOUCH VERIO) test strip Use to test blood sugar 4 times daily-Dx code E11.65 400 each 0   insulin lispro (HUMALOG KWIKPEN) 200 UNIT/ML KwikPen Inject 10-14 units before the 3 meals of the day (Patient taking differently: Inject 10-14 Units into the skin 3 (three) times daily before meals. Per sliding scale) 18 mL 3   Insulin Syringe-Needle U-100 (B-D INSULIN SYRINGE 1CC/25GX1") 25G X 1" 1 ML MISC USE TO INJECT TESTOSTERONE WEEKLY (Patient taking differently: 1 each by Other route See admin  instructions. USE TO INJECT TESTOSTERONE WEEKLY) 4 each 99   metFORMIN (GLUCOPHAGE) 1000 MG tablet Take 1 tablet 2x a day (Patient taking differently: Take 1,000 mg by mouth 2 (two) times daily with a meal.) 180 tablet 3   methadone (DOLOPHINE) 10 MG/ML solution Take 90 mg by mouth daily.     OneTouch Delica Lancets 38B MISC USE TO TEST BLOOD SUGAR 4 TIMES DAILY-DX code E11.65 200 each 0   OZEMPIC, 1 MG/DOSE, 4 MG/3ML SOPN INJECT $RemoveBef'1MG'rXfkdtZBzp$  INTO THE SKIN ONCE A WEEK (Patient taking differently: Inject 1 mg into the skin once a week.) 9 mL 3   pantoprazole (PROTONIX) 40 MG tablet Take 1 tablet (40 mg total) by mouth 2 (two) times daily before a meal. 60 tablet 0   rosuvastatin (CRESTOR) 10 MG tablet TAKE 1 TABLET BY MOUTH EVERY DAY (Patient taking differently: Take 10 mg by mouth daily.) 90 tablet 2   sucralfate (CARAFATE) 1 g tablet Take 1 tablet (1 g total) by mouth 4 (four) times daily -  with meals and at bedtime. 56 tablet 0   SYRINGE-NEEDLE, DISP, 3 ML (B-D 3CC LUER-LOK SYR 18GX1-1/2) 18G X 1-1/2" 3 ML MISC USE TO DRAW UP TESTOSTERONE (Patient taking differently: 1 each by Other route See admin instructions. USE TO DRAW UP TESTOSTERONE) 1 each 1   Syringe/Needle, Disp, 18G X 1" 1 ML MISC Use to draw up testosterone (Patient taking differently: 1 each by Other route See admin instructions. Use to draw up testosterone) 100 each 5   testosterone cypionate (DEPOTESTOSTERONE CYPIONATE) 200 MG/ML injection INJECT 0.25MLS INTO THE MUSCLE ONCE WEEKLY 1 mL 1   fenofibrate 160 MG tablet Take 1 tablet (160 mg total) by mouth daily. (Patient not taking: Reported on 07/09/2021) 90 tablet 1   LORazepam (ATIVAN) 0.5 MG tablet Take 0.5 mg by mouth at bedtime. (Patient not taking: Reported on 07/09/2021)     QUEtiapine (SEROQUEL) 50 MG tablet Take 100 mg by mouth at bedtime. (Patient not taking: Reported on  07/09/2021)     traZODone (DESYREL) 100 MG tablet Take 100 mg by mouth at bedtime. (Patient not taking: Reported on  07/09/2021)     [DISCONTINUED] insulin aspart (NOVOLOG FLEXPEN) 100 UNIT/ML FlexPen Inject 10-15 Units into the skin 3 (three) times daily with meals. 30 mL 5   No current facility-administered medications on file prior to visit.    BP 120/62    Pulse 83    Temp 97.7 F (36.5 C) (Temporal)    Ht $R'5\' 9"'HD$  (1.753 m)    Wt 175 lb (79.4 kg)    SpO2 96%    BMI 25.84 kg/m  Objective:   Physical Exam Cardiovascular:     Rate and Rhythm: Normal rate and regular rhythm.  Pulmonary:     Effort: Pulmonary effort is normal.     Breath sounds: Normal breath sounds. No wheezing or rales.  Abdominal:     Tenderness: There is abdominal tenderness in the right upper quadrant, epigastric area and periumbilical area. There is no guarding.  Musculoskeletal:     Cervical back: Neck supple.  Skin:    General: Skin is warm and dry.  Neurological:     Mental Status: He is alert and oriented to person, place, and time.          Assessment & Plan:      This visit occurred during the SARS-CoV-2 public health emergency.  Safety protocols were in place, including screening questions prior to the visit, additional usage of staff PPE, and extensive cleaning of exam room while observing appropriate contact time as indicated for disinfecting solutions.

## 2021-07-18 NOTE — Assessment & Plan Note (Signed)
Uncontrolled with recent A1c of 11.5 and hospital admission for DKA.  Appears stable and improving today.  Suspect his recent DKA is secondary to insufficient insulin as he has been out of Lantus over the last few months.  Discussed the absolute need for medication compliance and to notify any of his providers when out of a certain medication.  Refills provided for Lantus insulin, start with 10 units daily and titrate upward, directions provided to patient's wife and himself.  Continue Jardiance 25 mg daily, metformin 1000 mg twice daily, Ozempic 1 mg weekly, Humalog 14 units 3 times daily with meals.  He will follow-up with his endocrinologist in early January 2023.  Hospital notes, labs, imaging reviewed.

## 2021-07-18 NOTE — Assessment & Plan Note (Signed)
Discovered via endoscopy during recent hospital visit.  Continue pantoprazole 40 mg twice daily. Refrain from all NSAID use. Unclear why he is managed on acyclovir, but will have him hold given side effects.  Follow-up with GI as scheduled.  Hospital notes, labs, imaging reviewed.

## 2021-07-19 ENCOUNTER — Other Ambulatory Visit: Payer: Self-pay | Admitting: Nurse Practitioner

## 2021-07-19 NOTE — Telephone Encounter (Signed)
Dr Christella Hartigan the pt was only able to complete 2 days of the acyclovir.  Please advise next steps

## 2021-07-19 NOTE — Telephone Encounter (Signed)
Dr. Adela Lank, see patient msg below. He did not tolerate Acyclovir with N/V. Valtrex 1000mg  po bid x 10 days is an alternative treatment for HSV1 esophagitis but also has similar N/V side effect profile. Let me know if you have other recommendations or consider referral to infectious disease specialist?

## 2021-07-20 ENCOUNTER — Other Ambulatory Visit: Payer: Self-pay

## 2021-07-20 ENCOUNTER — Other Ambulatory Visit (INDEPENDENT_AMBULATORY_CARE_PROVIDER_SITE_OTHER): Payer: 59

## 2021-07-20 ENCOUNTER — Telehealth: Payer: Self-pay

## 2021-07-20 DIAGNOSIS — D72829 Elevated white blood cell count, unspecified: Secondary | ICD-10-CM | POA: Diagnosis not present

## 2021-07-20 LAB — WHITE CELL DIFFERENTIAL
Band Neutrophils: 0 %
Basophils Relative: 0.4 % (ref 0.0–3.0)
Eosinophils Relative: 1.1 % (ref 0.0–5.0)
Lymphocytes Relative: 15.4 % (ref 12.0–46.0)
Monocytes Relative: 10.1 % (ref 3.0–12.0)
Neutrophils Relative %: 73 % (ref 43.0–77.0)

## 2021-07-20 MED ORDER — ACYCLOVIR 400 MG PO TABS: 400.0000 mg | ORAL_TABLET | Freq: Three times a day (TID) | ORAL | 0 refills | Status: AC

## 2021-07-20 MED ORDER — ONDANSETRON HCL 4 MG PO TABS: 4.0000 mg | ORAL_TABLET | Freq: Two times a day (BID) | ORAL | 0 refills | Status: AC

## 2021-07-20 NOTE — Telephone Encounter (Signed)
Spoke with pt and his wife regarding the new prescriptions. Pt understands that he needs to take the zofran 1 day prior to starting the acyclovir, and if symptoms do not improve then to give [place] a call back. No further questions at this time.

## 2021-07-24 ENCOUNTER — Encounter: Payer: Self-pay | Admitting: Gastroenterology

## 2021-07-27 ENCOUNTER — Ambulatory Visit (INDEPENDENT_AMBULATORY_CARE_PROVIDER_SITE_OTHER): Payer: 59

## 2021-07-27 ENCOUNTER — Ambulatory Visit (INDEPENDENT_AMBULATORY_CARE_PROVIDER_SITE_OTHER): Payer: 59 | Admitting: Cardiology

## 2021-07-27 ENCOUNTER — Other Ambulatory Visit: Payer: Self-pay

## 2021-07-27 ENCOUNTER — Encounter: Payer: Self-pay | Admitting: Cardiology

## 2021-07-27 VITALS — BP 120/70 | HR 85 | Ht 69.0 in | Wt 185.0 lb

## 2021-07-27 DIAGNOSIS — I480 Paroxysmal atrial fibrillation: Secondary | ICD-10-CM | POA: Diagnosis not present

## 2021-07-27 DIAGNOSIS — E782 Mixed hyperlipidemia: Secondary | ICD-10-CM | POA: Diagnosis not present

## 2021-07-27 NOTE — Patient Instructions (Signed)
Medication Instructions:   Your physician recommends that you continue on your current medications as directed. Please refer to the Current Medication list given to you today.  *If you need a refill on your cardiac medications before your next appointment, please call your pharmacy*   Lab Work:  Your physician recommends that you return for a FASTING lipid profile: At your earliest convenience  - You will need to be fasting. Please do not have anything to eat or drink after midnight the morning you have the lab work. You may only have water or black coffee with no cream or sugar.   Please return to our office on _________________at___________________am/pm     Testing/Procedures:  Your physician has recommended that you wear a Zio monitor for 2 weeks, this will be delivered to your house.   This monitor is a medical device that records the hearts electrical activity. Doctors most often use these monitors to diagnose arrhythmias. Arrhythmias are problems with the speed or rhythm of the heartbeat. The monitor is a small device applied to your chest. You can wear one while you do your normal daily activities. While wearing this monitor if you have any symptoms to push the button and record what you felt. Once you have worn this monitor for the period of time provider prescribed (Usually 14 days), you will return the monitor device in the postage paid box. Once it is returned they will download the data collected and provide [place] with a report which the provider will then review and we will call you with those results. Important tips:  Avoid showering during the first 24 hours of wearing the monitor. Avoid excessive sweating to help maximize wear time. Do not submerge the device, no hot tubs, and no swimming pools. Keep any lotions or oils away from the patch. After 24 hours you may shower with the patch on. Take brief showers with your back facing the shower head.  Do not remove patch once it  has been placed because that will interrupt data and decrease adhesive wear time. Push the button when you have any symptoms and write down what you were feeling. Once you have completed wearing your monitor, remove and place into box which has postage paid and place in your outgoing mailbox.  If for some reason you have misplaced your box then call our office and we can provide another box and/or mail it off for you.      Follow-Up: At [facility], you and your health needs are our priority.  As part of our continuing mission to provide you with exceptional heart care, we have created designated Provider Care Teams.  These Care Teams include your primary Cardiologist (physician) and Advanced Practice Providers (APPs -  Physician Assistants and Nurse Practitioners) who all work together to provide you with the care you need, when you need it.  We recommend signing up for the patient portal called "MyChart".  Sign up information is provided on this After Visit Summary.  MyChart is used to connect with patients for Virtual Visits (Telemedicine).  Patients are able to view lab/test results, encounter notes, upcoming appointments, etc.  Non-urgent messages can be sent to your provider as well.   To learn more about what you can do with MyChart, go to [email or web address].    Your next appointment:   3 month(s)  The format for your next appointment:   In Person  Provider:   You may see Debbe Odea MD or one of the following  Advanced Practice Providers on your designated Care Team:   Nicolasa Ducking, NP Eula Listen, PA-C    Other Instructions

## 2021-07-27 NOTE — Progress Notes (Signed)
Cardiology Office Note:    Date:  07/27/2021   ID:  Martin Downs, DOB 12-17-1965, MRN [id number]  PCP:  Lesleigh Noe, MD   [place] Providers Cardiologist:  None     Referring MD: Lesleigh Noe, MD   Chief Complaint  Patient presents with   Follow up [facility]     "Doing well." Medications reviewed by the patient verbally.    Martin Downs is a 55 y.o. male who is being seen today for the evaluation of atrial fibrillation at the request of Lesleigh Noe, MD.   History of Present Illness:    Martin Downs is a 55 y.o. male with a hx of hyperlipidemia, diabetes, chronic pain on methadone who presents to establish care.  He was admitted 2 weeks ago at [facility] for nausea and vomiting.  Diagnosed with DKA and also new onset A. fib with RVR.  Peptic ulcer disease also noted on CT.  Underwent EGD showing esophagitis and duodenal ulcers.  Treated with PPI with improvement in symptoms.  Atrial fibrillation was managed with IV Cardizem, eventually switched to oral Cardizem upon discharge.  Echocardiogram on 07/10/2021 showed EF 60 to 65%.  Onset of A. fib was deemed in the context of acute illness including DKA, dehydration peptic ulcer.  Aspirin was avoided in light of duodenal ulcers and low CHA2DS2-VASc score of 1.  He states feeling okay since hospital discharge.  Denies palpitations.  Has appointments with GI and endocrinology.  Used to be on fenofibrate, has not taken this for some time now.  Unsure why this was stopped but it is not among his current medications.  Past Medical History:  Diagnosis Date   Acute gangrenous cholecystitis s/p cholecystectomy 05/30/2016 05/31/2016   Anxiety    Borderline diabetes    Cholecystitis 05/30/2016   Chronic pain    Common bile duct repair with T-Tube 11/192017 05/31/2016   Depression    Diabetes mellitus without complication ([place])    Hyperlipidemia    Narcotic dependence ([place])    Sleep apnea     no cpap    Past Surgical History:  Procedure Laterality Date   BIOPSY  07/10/2021   Procedure: BIOPSY;  Surgeon: Yetta Flock, MD;  Location: [facility];  Service: Gastroenterology;;   CHOLECYSTECTOMY N/A 05/30/2016   Procedure: LAPAROSCOPIC CHOLECYSTECTOMY WITH INTRAOPERATIVE CHOLANGIOGRAM;  Surgeon: Donnie Mesa, MD;  Location: [place];  Service: General;  Laterality: N/A;   CHOLECYSTECTOMY N/A 05/30/2016   Procedure: OPEN COMMON BILE DUCT EXPLORATION; INSERTION OF T-TUBE;  Surgeon: Donnie Mesa, MD;  Location: [place];  Service: General;  Laterality: N/A;   ESOPHAGOGASTRODUODENOSCOPY (EGD) WITH PROPOFOL N/A 07/10/2021   Procedure: ESOPHAGOGASTRODUODENOSCOPY (EGD) WITH PROPOFOL;  Surgeon: Yetta Flock, MD;  Location: [place];  Service: Gastroenterology;  Laterality: N/A;   removal of bone spur Bilateral 1995   elbps   TOOTH EXTRACTION  0786   UMBILICAL HERNIA REPAIR N/A 05/30/2016   Procedure: HERNIA REPAIR UMBILICAL ADULT;  Surgeon: Donnie Mesa, MD;  Location: [place];  Service: General;  Laterality: N/A;    Current Medications: Current Meds  Medication Sig   acyclovir (ZOVIRAX) 400 MG tablet Take 1 tablet (400 mg total) by mouth 3 (three) times daily for 7 days.   BD DISP NEEDLES 18G X 1-1/2" MISC USE TO DRAW UP TESTOSTERONE (Patient taking differently: 1 each by Other route See admin instructions. USE TO DRAW UP TESTOSTERONE)   BD INSULIN SYRINGE U/F 31G  X 5/16" 0.5 ML MISC USE 3 TIMES DAILY (Patient taking differently: 1 each by Other route See admin instructions. USE 3 TIMES DAILY)   BD PEN NEEDLE NANO 2ND GEN 32G X 4 MM MISC USE TO INJECT INSULIN 1 TIME DAILY AS INSTRUCTED. (Patient taking differently: 1 each by Other route See admin instructions. USE TO INJECT INSULIN 1 TIME DAILY AS INSTRUCTED.)   Blood Glucose Monitoring Suppl (ONE TOUCH ULTRA MINI) W/DEVICE KIT Use as directed to test blood sugar twice daily 250.00 (Patient taking differently: 1 each by Other  route See admin instructions. Use as directed to test blood sugar twice daily 250.00)   Continuous Blood Gluc Sensor (FREESTYLE LIBRE 3 SENSOR) MISC 1 Device by Does not apply route continuous. Place 1 sensor on the skin every 14 days. Use to check glucose continuously   diltiazem (CARDIZEM CD) 120 MG 24 hr capsule Take 1 capsule (120 mg total) by mouth daily.   divalproex (DEPAKOTE ER) 500 MG 24 hr tablet Take 500 mg by mouth daily.   DULoxetine (CYMBALTA) 20 MG capsule Take 20 mg by mouth 2 (two) times daily.   empagliflozin (JARDIANCE) 25 MG TABS tablet Take 1 tablet (25 mg total) by mouth daily.   glucose blood (ONETOUCH VERIO) test strip Use to test blood sugar 4 times daily-Dx code E11.65   insulin glargine (LANTUS SOLOSTAR) 100 UNIT/ML Solostar Pen Inject 10-20 units at bedtime for diabetes.   insulin lispro (HUMALOG KWIKPEN) 200 UNIT/ML KwikPen Inject 10-14 units before the 3 meals of the day (Patient taking differently: Inject 10-14 Units into the skin 3 (three) times daily before meals. Per sliding scale)   Insulin Syringe-Needle U-100 (B-D INSULIN SYRINGE 1CC/25GX1") 25G X 1" 1 ML MISC USE TO INJECT TESTOSTERONE WEEKLY (Patient taking differently: 1 each by Other route See admin instructions. USE TO INJECT TESTOSTERONE WEEKLY)   metFORMIN (GLUCOPHAGE) 1000 MG tablet Take 1 tablet 2x a day (Patient taking differently: Take 1,000 mg by mouth 2 (two) times daily with a meal.)   methadone (DOLOPHINE) 10 MG/ML solution Take 90 mg by mouth daily.   ondansetron (ZOFRAN) 4 MG tablet Take 1 tablet (4 mg total) by mouth 2 (two) times daily for 15 days.   OneTouch Delica Lancets 07W MISC USE TO TEST BLOOD SUGAR 4 TIMES DAILY-DX code E11.65   OZEMPIC, 1 MG/DOSE, 4 MG/3ML SOPN INJECT $RemoveBef'1MG'CpPxEbyeAL$  INTO THE SKIN ONCE A WEEK (Patient taking differently: Inject 1 mg into the skin once a week.)   pantoprazole (PROTONIX) 40 MG tablet Take 1 tablet (40 mg total) by mouth 2 (two) times daily before a meal.    rosuvastatin (CRESTOR) 10 MG tablet TAKE 1 TABLET BY MOUTH EVERY DAY (Patient taking differently: Take 10 mg by mouth daily.)   sucralfate (CARAFATE) 1 g tablet Take 1 tablet (1 g total) by mouth 4 (four) times daily -  with meals and at bedtime.   SYRINGE-NEEDLE, DISP, 3 ML (B-D 3CC LUER-LOK SYR 18GX1-1/2) 18G X 1-1/2" 3 ML MISC USE TO DRAW UP TESTOSTERONE (Patient taking differently: 1 each by Other route See admin instructions. USE TO DRAW UP TESTOSTERONE)   Syringe/Needle, Disp, 18G X 1" 1 ML MISC Use to draw up testosterone (Patient taking differently: 1 each by Other route See admin instructions. Use to draw up testosterone)   testosterone cypionate (DEPOTESTOSTERONE CYPIONATE) 200 MG/ML injection INJECT 0.25MLS INTO THE MUSCLE ONCE WEEKLY     Allergies:   Gemfibrozil   Social History   Socioeconomic History  Marital status: Married    Spouse name: Amy   Number of children: 2   Years of education: Not on file   Highest education level: High school graduate  Occupational History   Occupation: Music therapist: UNEMPLOYED  Tobacco Use   Smoking status: Never   Smokeless tobacco: Former    Types: Snuff    Quit date: 03/30/2020  Vaping Use   Vaping Use: Never used  Substance and Sexual Activity   Alcohol use: No    Comment: quit 2009   Drug use: Not Currently   Sexual activity: Yes    Birth control/protection: Post-menopausal  Other Topics Concern   Not on file  Social History Narrative   11/23/19   From: the area   Living: with wife Amy, high school sweet hearts   Work: Music therapist, works Warehouse manager for a [employer]      Family: 2 children - Wellsite geologist and Sport and exercise psychologist - grown and out of the house      Enjoys: not sure, works too much, watch TV      Exercise: walking at his job - bending/lifting   Diet: tries to do diabetic diet - but only partially, low appetite      Safety   Seat belts: Yes    Guns: declined   Safe in relationships: Yes    Social  Determinants of Radio broadcast assistant Strain: Not on file  Food Insecurity: Not on file  Transportation Needs: Not on file  Physical Activity: Not on file  Stress: Not on file  Social Connections: Not on file     Family History: The patient's family history includes Cancer in his father; Diabetes in his brother and father; Heart attack in his brother; Heart attack (age of onset: 55) in his father; Hypertension in his brother, father, and mother; Kidney cancer in his brother. There is no history of Colon cancer.  ROS:   Please see the history of present illness.     All other systems reviewed and are negative.  EKGs/Labs/Other Studies Reviewed:    The following studies were reviewed today:   EKG:  EKG is  ordered today.  The ekg ordered today demonstrates normal sinus rhythm, normal ECG  Recent Labs: 07/09/2021: TSH 0.606 07/12/2021: ALT 11; Magnesium 2.3 07/18/2021: BUN 11; Creatinine, Ser 0.76; Hemoglobin 14.6; Platelets 367.0; Potassium 4.6; Sodium 137  Recent Lipid Panel    Component Value Date/Time   CHOL 170 11/23/2020 0905   CHOL 130 07/08/2019 0750   TRIG (H) 11/23/2020 0905    479.0 Triglyceride is over 400; calculations on Lipids are invalid.   HDL 35.60 (L) 11/23/2020 0905   HDL 34 (L) 07/08/2019 0750   CHOLHDL 5 11/23/2020 0905   VLDL 35.6 03/01/2016 0906   LDLCALC 56 07/08/2019 0750   LDLDIRECT 102.0 11/23/2020 0905     Risk Assessment/Calculations:          Physical Exam:    VS:  BP 120/70 (BP Location: Right Arm, Patient Position: Sitting, Cuff Size: Normal)    Pulse 85    Ht $R'5\' 9"'id$  (1.753 m)    Wt 185 lb (83.9 kg)    SpO2 97%    BMI 27.32 kg/m     Wt Readings from Last 3 Encounters:  07/27/21 185 lb (83.9 kg)  07/18/21 175 lb (79.4 kg)  07/09/21 190 lb (86.2 kg)     GEN:  Well nourished, well developed in no acute distress HEENT: Normal  NECK: No JVD; No carotid bruits LYMPHATICS: No lymphadenopathy CARDIAC: RRR, no murmurs, rubs,  gallops RESPIRATORY:  Clear to auscultation without rales, wheezing or rhonchi  ABDOMEN: Soft, non-tender, non-distended MUSCULOSKELETAL:  No edema; No deformity  SKIN: Warm and dry NEUROLOGIC:  Alert and oriented x 3 PSYCHIATRIC:  Normal affect   ASSESSMENT:    1. Paroxysmal atrial fibrillation (HCC)   2. Mixed hyperlipidemia    PLAN:    In order of problems listed above:  Paroxysmal atrial fibrillation, CHA2DS2-VASc of 1.  A. fib occurred in the context of DKA.  Echocardiogram showed normal systolic and diastolic function, EF 60 to 65%.  Place cardiac monitor to evaluate any A. fib recurrence.  Due to significant peptic ulcer disease including gastritis, duodenitis, low CHA2DS2-VASc score, A. fib in the context of acute illness, will continue to avoid anticoagulation.  Continue Cardizem 120 mg daily. Hyperlipidemia, continue Crestor.  Repeat fasting lipid profile, if triglycerides elevated, restart fenofibrate 160 mg daily.  Follow-up in 3 months after cardiac monitor.        Medication Adjustments/Labs and Tests Ordered: Current medicines are reviewed at length with the patient today.  Concerns regarding medicines are outlined above.  Orders Placed This Encounter  Procedures   Lipid panel   LONG TERM MONITOR (3-14 DAYS)   EKG 12-Lead   No orders of the defined types were placed in this encounter.   Patient Instructions  Medication Instructions:   Your physician recommends that you continue on your current medications as directed. Please refer to the Current Medication list given to you today.  *If you need a refill on your cardiac medications before your next appointment, please call your pharmacy*   Lab Work:  Your physician recommends that you return for a FASTING lipid profile: At your earliest convenience  - You will need to be fasting. Please do not have anything to eat or drink after midnight the morning you have the lab work. You may only have water or black  coffee with no cream or sugar.   Please return to our office on _________________at___________________am/pm     Testing/Procedures:  Your physician has recommended that you wear a Zio monitor for 2 weeks, this will be delivered to your house.   This monitor is a medical device that records the hearts electrical activity. Doctors most often use these monitors to diagnose arrhythmias. Arrhythmias are problems with the speed or rhythm of the heartbeat. The monitor is a small device applied to your chest. You can wear one while you do your normal daily activities. While wearing this monitor if you have any symptoms to push the button and record what you felt. Once you have worn this monitor for the period of time provider prescribed (Usually 14 days), you will return the monitor device in the postage paid box. Once it is returned they will download the data collected and provide [place] with a report which the provider will then review and we will call you with those results. Important tips:  Avoid showering during the first 24 hours of wearing the monitor. Avoid excessive sweating to help maximize wear time. Do not submerge the device, no hot tubs, and no swimming pools. Keep any lotions or oils away from the patch. After 24 hours you may shower with the patch on. Take brief showers with your back facing the shower head.  Do not remove patch once it has been placed because that will interrupt data and decrease adhesive wear time. Push the  button when you have any symptoms and write down what you were feeling. Once you have completed wearing your monitor, remove and place into box which has postage paid and place in your outgoing mailbox.  If for some reason you have misplaced your box then call our office and we can provide another box and/or mail it off for you.      Follow-Up: At [facility], you and your health needs are our priority.  As part of our continuing mission to provide you with  exceptional heart care, we have created designated Provider Care Teams.  These Care Teams include your primary Cardiologist (physician) and Advanced Practice Providers (APPs -  Physician Assistants and Nurse Practitioners) who all work together to provide you with the care you need, when you need it.  We recommend signing up for the patient portal called "MyChart".  Sign up information is provided on this After Visit Summary.  MyChart is used to connect with patients for Virtual Visits (Telemedicine).  Patients are able to view lab/test results, encounter notes, upcoming appointments, etc.  Non-urgent messages can be sent to your provider as well.   To learn more about what you can do with MyChart, go to [email or web address].    Your next appointment:   3 month(s)  The format for your next appointment:   In Person  Provider:   You may see Kate Sable MD or one of the following Advanced Practice Providers on your designated Care Team:   Murray Hodgkins, NP Christell Faith, PA-C    Other Instructions     Signed, Kate Sable, MD  07/27/2021 10:25 AM    [place]

## 2021-07-29 DIAGNOSIS — I480 Paroxysmal atrial fibrillation: Secondary | ICD-10-CM | POA: Diagnosis not present

## 2021-08-01 ENCOUNTER — Other Ambulatory Visit (INDEPENDENT_AMBULATORY_CARE_PROVIDER_SITE_OTHER): Payer: 59

## 2021-08-01 ENCOUNTER — Other Ambulatory Visit: Payer: Self-pay

## 2021-08-01 DIAGNOSIS — E782 Mixed hyperlipidemia: Secondary | ICD-10-CM | POA: Diagnosis not present

## 2021-08-02 ENCOUNTER — Other Ambulatory Visit: Payer: Self-pay

## 2021-08-02 LAB — LIPID PANEL
Chol/HDL Ratio: 3.4 ratio (ref 0.0–5.0)
Cholesterol, Total: 134 mg/dL (ref 100–199)
HDL: 39 mg/dL — ABNORMAL LOW (ref >39)
LDL Chol Calc (NIH): 58 mg/dL (ref 0–99)
Triglycerides: 230 mg/dL — ABNORMAL HIGH (ref 0–149)
VLDL Cholesterol Cal: 37 mg/dL (ref 5–40)

## 2021-08-04 ENCOUNTER — Other Ambulatory Visit: Payer: Self-pay

## 2021-08-04 ENCOUNTER — Ambulatory Visit: Payer: 59 | Admitting: Internal Medicine

## 2021-08-04 ENCOUNTER — Encounter: Payer: Self-pay | Admitting: Internal Medicine

## 2021-08-04 VITALS — BP 100/60 | HR 91 | Ht 69.0 in | Wt 185.4 lb

## 2021-08-04 DIAGNOSIS — Z794 Long term (current) use of insulin: Secondary | ICD-10-CM

## 2021-08-04 DIAGNOSIS — E1122 Type 2 diabetes mellitus with diabetic chronic kidney disease: Secondary | ICD-10-CM

## 2021-08-04 DIAGNOSIS — E1165 Type 2 diabetes mellitus with hyperglycemia: Secondary | ICD-10-CM | POA: Diagnosis not present

## 2021-08-04 DIAGNOSIS — E230 Hypopituitarism: Secondary | ICD-10-CM

## 2021-08-04 DIAGNOSIS — N189 Chronic kidney disease, unspecified: Secondary | ICD-10-CM

## 2021-08-04 DIAGNOSIS — E1310 Other specified diabetes mellitus with ketoacidosis without coma: Secondary | ICD-10-CM

## 2021-08-04 DIAGNOSIS — E1110 Type 2 diabetes mellitus with ketoacidosis without coma: Secondary | ICD-10-CM

## 2021-08-04 DIAGNOSIS — E781 Pure hyperglyceridemia: Secondary | ICD-10-CM

## 2021-08-04 DIAGNOSIS — R7989 Other specified abnormal findings of blood chemistry: Secondary | ICD-10-CM

## 2021-08-04 MED ORDER — LANTUS SOLOSTAR 100 UNIT/ML ~~LOC~~ SOPN: PEN_INJECTOR | SUBCUTANEOUS | 0 refills | Status: DC

## 2021-08-04 MED ORDER — FENOFIBRATE 160 MG PO TABS: 160.0000 mg | ORAL_TABLET | Freq: Every day | ORAL | 3 refills | Status: AC

## 2021-08-04 MED ORDER — INSULIN PEN NEEDLE 32G X 4 MM MISC: 3 refills | Status: DC

## 2021-08-04 NOTE — Patient Instructions (Addendum)
Please continue: - Metformin 1000 mg 2x daily - Jardiance 25 mg in a.m. - Ozempic 1 mg weekly  Change: - Lantus 18 units daily - Humalog U200 8-12 units 15 min before meals  Please continue: - Testosterone injection 50 mg every 2 weeks into the muscle (draw to the 0.25 ml mark).  Please return in 3 months.

## 2021-08-04 NOTE — Progress Notes (Addendum)
Patient ID: Martin Downs, male   DOB: 1966-03-13, 56 y.o.   MRN: [id number]  This visit occurred during the SARS-CoV-2 public health emergency.  Safety protocols were in place, including screening questions prior to the visit, additional usage of staff PPE, and extensive cleaning of exam room while observing appropriate contact time as indicated for disinfecting solutions.   HPI: Martin Downs is a 56 y.o.-year-old male, returning for f/u for DM2 dx 2014, insulin-dependent since 02/2014, uncontrolled, with complications (mild CKD, diabetic retinopathy) and hypogonadotropic hypogonadism. Last visit 6 months ago.  Interim history: Before last visit, he started to see psychiatry for depression and anxiety.  He was also referred to a sleep specialist for his sleep apnea.  He could not tolerate the CPAP.  He was on Lexapro, Depakote, Trazodone, Risperdal >> now off.  He is still sleeping poorly.  He was terminated due to prolonged FMLA for his elbow pain.  He is still out of work. He was recently admitted to the hospital on 07/08/2021 with DKA, with a glucose of 480 and high beta hydroxybutyrate.  At that time, he was found to be in A. fib with RVR -he has an event monitor now.  He will see cardiology soon. The lipase was elevated, at 62.  He had a peptic ulcer on CT and also duodenitis.  On 07/18/2021 he had an EGD that showed severe esophagitis and multiple duodenal ulcers.  He was see GI soon for repeat endoscopy.   He mentions that he was off most of the medicines including insulin at that time. He restarted them since them and now taking them consistently - for the first time, he mentions.  DM2: Reviewed HbA1c levels: Lab Results  Component Value Date   HGBA1C 11.5 (H) 07/09/2021   HGBA1C 8.3 (A) 02/07/2021   HGBA1C 12.2 (A) 11/21/2020   At last visit he was on: -  >> Tresiba 60 >> 80 >> off >> 60 units daily  -  Humalog 14 units 3 times daily before meals  - Metformin 1000 mg 2x  daily -Jardiance 25 mg in a.m - Ozempic 1 mg weekly We stopped Actoplus 15-850 mg in 02/2014. We stopped Glipizide when we increased Novolog. Insurance does not cover Invokana. We tried to add Actos 15 mg daily (added back 06/2016). We tried U500 insulin.  At today's visit he restarted: - Metformin 1000 mg 2x daily - Jardiance 25 mg in a.m. - Ozempic 1 mg weekly - Lantus 10 units daily - Humalog U200 14 units 15 min before meals  At last visit was not checking sugars at all.  At this visit, he is checking >4x A DAY: - am: 210-240 >> 243-275 >> 230s >> 57, 103-243 >> 108-154 - 2h after b'fast: n/c >>  239, 247 >> n/c >> 136-179 - lunch: 215 >> 230 >> 200s >> 200s >> 123-232 >> 76-149 - 2h after lunch:  n/c >> 230-280 >> 200-240 >> n/c >> 111-140 - dinner: 327-475 >> 200-240 >> 200s >> 200s >> 83-143 - 2h after dinner: 220-280 >> 310-425 >> 200-240 >> n/c >> 68-148 - bedtime:200-220 >> n/c >> 240-284 >> n/c >> 56-183 - nighttime: 130-218 >> n/c >> 239 >> n/c  >> 65-138 Lowest: 57 >> 56 Highest: 245 >> 206  Meter: One Touch Ultra mini  Pt's meals are -he grazes throughout the day: - Breakfast: oatmeal or yoghurt or fruit bar/cup (largest meal) - Lunch: PB sandwich - Dinner: Probation officer - Snacks: sandwich,  1 pack of crackers, diet Mountain dew  -+ Mild CKD, last BUN/creatinine:  Lab Results  Component Value Date   BUN 11 07/18/2021   CREATININE 0.76 07/18/2021   Lab Results  Component Value Date   MICRALBCREAT 2.8 11/23/2020   MICRALBCREAT 14 07/08/2019   MICRALBCREAT 0.3 05/13/2013   MICRALBCREAT 0.6 01/17/2010   MICRALBCREAT 4.8 12/30/2008   MICRALBCREAT 7.5 09/05/2007  Not on ACE inhibitor.  -+ HL; last set of lipids: Lab Results  Component Value Date   CHOL 134 08/01/2021   HDL 39 (L) 08/01/2021   LDLCALC 58 08/01/2021   LDLDIRECT 102.0 11/23/2020   TRIG 230 (H) 08/01/2021   CHOLHDL 3.4 08/01/2021  On Crestor 10 mg daily, fenofibrate 160 mg  daily.  - last eye exam was in 03/2021: + DR  -No numbness and tingling in his feet.  Hypogonadotropic hypogonadism: - He was on Androgel  in 2015 >> his testosterone levels did not increase and he did not feel better  >> he is now on intramuscular testosterone.  At last visit he was on 50 mg weekly, but upon questioning, he is actually taking 100 mg weekly inadvertently, as he was not aware of the testosterone concentration is 200 mg/mL.  Reviewed his testosterone levels: His level from 06/2019 was high on the above dose so we decreased the dose to 50 mg weekly since then but he did not return for further labs. Upon questioning, he decreased the dose as advised but also moved the injections every other week.  Subsequently, the latest testosterone levels were very low in 11/2019.  Upon questioning, he was taking half of the recommended dose so I advised him to increase the dose to once a week repeatedly at last visits, but he prefers to take it every 2 weeks.  Component     Latest Ref Rng & Units 02/07/2021  Testosterone, Serum (Total)     ng/dL 612  % Free Testosterone     % 1.8  Free Testosterone, S     pg/mL 110  Sex Hormone Binding Globulin     nmol/L 22.3   Component     Latest Ref Rng & Units 12/22/2019  Testosterone, Serum (Total)     ng/dL 15 (L)  % Free Testosterone     % 1.1  Free Testosterone, S     pg/mL 1.7 (L)  Sex Hormone Binding Globulin     nmol/L 45.1   Component     Latest Ref Rng & Units 07/08/2019  Testosterone, Serum (Total)     ng/dL 1,451 (H)  % Free Testosterone     % 3.0  Free Testosterone, S     pg/mL 435 (H)  Sex Hormone Binding Globulin     nmol/L 22.4   Component     Latest Ref Rng & Units 04/30/2017 12/11/2017  Testosterone     264 - 916 ng/dL 1,108 (H) 485  Testosterone Free     7.2 - 24.0 pg/mL 28.6 (H) 14.6  Sex Horm Binding Glob, Serum     19.3 - 76.4 nmol/L 33.0 39.5   PSA levels were normal: Lab Results  Component Value Date    PSA 0.53 11/23/2020   PSA 0.69 03/01/2016   PSA 0.91 10/18/2015   PSA 0.37 01/17/2010   PSA 0.55 11/07/2009   His hemoglobin and hematocrit levels were normal: Lab Results  Component Value Date   WBC 14.1 (H) 07/18/2021   HGB 14.6 07/18/2021   HCT 45.5 07/18/2021  MCV 88.8 07/18/2021   PLT 367.0 07/18/2021   -We checked his pituitary function in 2016 and the labs were normal except for inappropriately normal LH and FSH and also low testosterone.  Prolactin was high but the monomeric prolactin was normal:  Component     Latest Ref Rng 05/16/2015  Testosterone     300 - 890 ng/dL 132 (L)  Sex Hormone Binding     10 - 50 nmol/L 28  Testosterone Free     47.0 - 244.0 pg/mL 26.9 (L)  Testosterone-% Free     1.6 - 2.9 % 2.0  IGF-I, LC/MS     50 - 317 ng/mL 99  Z-Score (Male)     -2.0-+2.0 SD -0.7  Prolactin, Total     2.0 - 18.0 ng/mL 35.4 (H)  Prolactin, Monomeric     3.4 - 14.8 ng/mL 11.4  LH     1.50 - 9.30 mIU/mL 2.03  FSH     1.4 - 18.1 mIU/ML 5.9  Cortisol, Plasma      14.2  C206 ACTH     6 - 50 pg/mL 32   Previous labs (drawn at 8:33 AM)  Prolactin 38.6 (2.1-17.1) FSH 5.6, LH 2.0 Total testosterone 134 (300-890), bioavailable testosterone 35.5 ng/dL (130.5-681.7), free testosterone 18 pg/mL (47-244), SHBG 29 (10-50) PSA 0.16 Estradiol 17.1 (0-39)  Reviewed the report of his pituitary MRI 07/14/2015: No pituitary lesion  Hyperprolactinemia: -  found during investigation for low testosterone, by Dr. Junious Silk, his urologist. -This turned out to be related to macroprolactinoma which does not require further investigation or treatment  No gynecomastia, galactorrhea, breast tenderness.   Patient's alkaline phosphatase was low in 04/2017 (25) so he started a multivitamin.  He has a history of elevated TSH, but latest levels have been normal. Lab Results  Component Value Date   TSH 0.606 07/09/2021   TSH 1.93 11/23/2020   TSH 1.670 07/08/2019   TSH 2.79  11/11/2017   TSH 1.90 03/01/2016   TSH 1.52 05/16/2015   TSH 1.56 02/11/2014   TSH 1.68 05/13/2013   TSH 1.84 03/03/2013   TSH 0.69 12/29/2010   ROS: + see HPI + Fatigue, + joint aches   I reviewed pt's medications, allergies, PMH, social hx, family hx, and changes were documented in the history of present illness. Otherwise, unchanged from my initial visit note.  Past Medical History:  Diagnosis Date   Acute gangrenous cholecystitis s/p cholecystectomy 05/30/2016 05/31/2016   Anxiety    Borderline diabetes    Cholecystitis 05/30/2016   Chronic pain    Common bile duct repair with T-Tube 11/192017 05/31/2016   Depression    Diabetes mellitus without complication ([place])    Hyperlipidemia    Narcotic dependence ([place])    Sleep apnea    no cpap   Past Surgical History:  Procedure Laterality Date   BIOPSY  07/10/2021   Procedure: BIOPSY;  Surgeon: Yetta Flock, MD;  Location: [facility];  Service: Gastroenterology;;   CHOLECYSTECTOMY N/A 05/30/2016   Procedure: LAPAROSCOPIC CHOLECYSTECTOMY WITH INTRAOPERATIVE CHOLANGIOGRAM;  Surgeon: Donnie Mesa, MD;  Location: [facility];  Service: General;  Laterality: N/A;   CHOLECYSTECTOMY N/A 05/30/2016   Procedure: OPEN COMMON BILE DUCT EXPLORATION; INSERTION OF T-TUBE;  Surgeon: Donnie Mesa, MD;  Location: [facility];  Service: General;  Laterality: N/A;   ESOPHAGOGASTRODUODENOSCOPY (EGD) WITH PROPOFOL N/A 07/10/2021   Procedure: ESOPHAGOGASTRODUODENOSCOPY (EGD) WITH PROPOFOL;  Surgeon: Yetta Flock, MD;  Location: [facility];  Service: Gastroenterology;  Laterality: N/A;   removal of bone spur Bilateral 1995   elbps   TOOTH EXTRACTION  4388   UMBILICAL HERNIA REPAIR N/A 05/30/2016   Procedure: HERNIA REPAIR UMBILICAL ADULT;  Surgeon: Donnie Mesa, MD;  Location: [facility];  Service: General;  Laterality: N/A;   Social History   Socioeconomic History   Marital status: Married    Spouse name: Amy   Number of children: 2    Years of education: Not on file   Highest education level: High school graduate  Occupational History   Occupation: Music therapist: UNEMPLOYED  Tobacco Use   Smoking status: Never   Smokeless tobacco: Former    Types: Snuff    Quit date: 03/30/2020  Vaping Use   Vaping Use: Never used  Substance and Sexual Activity   Alcohol use: No    Comment: quit 2009   Drug use: Not Currently   Sexual activity: Yes    Birth control/protection: Post-menopausal  Other Topics Concern   Not on file  Social History Narrative   11/23/19   From: the area   Living: with wife Amy, high school sweet hearts   Work: Music therapist, works Warehouse manager for a [employer]      Family: 2 children - Wellsite geologist and Sport and exercise psychologist - grown and out of the house      Enjoys: not sure, works too much, watch TV      Exercise: walking at his job - bending/lifting   Diet: tries to do diabetic diet - but only partially, low appetite      Safety   Seat belts: Yes    Guns: declined   Safe in relationships: Yes    Social Determinants of Radio broadcast assistant Strain: Not on file  Food Insecurity: Not on file  Transportation Needs: Not on file  Physical Activity: Not on file  Stress: Not on file  Social Connections: Not on file  Intimate Partner Violence: Not on file   Current Outpatient Medications on File Prior to Visit  Medication Sig Dispense Refill   BD DISP NEEDLES 18G X 1-1/2" MISC USE TO DRAW UP TESTOSTERONE (Patient taking differently: 1 each by Other route See admin instructions. USE TO DRAW UP TESTOSTERONE) 4 each 24   BD INSULIN SYRINGE U/F 31G X 5/16" 0.5 ML MISC USE 3 TIMES DAILY (Patient taking differently: 1 each by Other route See admin instructions. USE 3 TIMES DAILY) 100 each 0   BD PEN NEEDLE NANO 2ND GEN 32G X 4 MM MISC USE TO INJECT INSULIN 1 TIME DAILY AS INSTRUCTED. (Patient taking differently: 1 each by Other route See admin instructions. USE TO INJECT INSULIN 1 TIME DAILY AS  INSTRUCTED.) 100 each 3   Blood Glucose Monitoring Suppl (ONE TOUCH ULTRA MINI) W/DEVICE KIT Use as directed to test blood sugar twice daily 250.00 (Patient taking differently: 1 each by Other route See admin instructions. Use as directed to test blood sugar twice daily 250.00) 1 each 0   Continuous Blood Gluc Sensor (FREESTYLE LIBRE 3 SENSOR) MISC 1 Device by Does not apply route continuous. Place 1 sensor on the skin every 14 days. Use to check glucose continuously 2 each 4   diltiazem (CARDIZEM CD) 120 MG 24 hr capsule Take 1 capsule (120 mg total) by mouth daily. 30 capsule 0   divalproex (DEPAKOTE ER) 500 MG 24 hr tablet Take 500 mg by mouth daily.     DULoxetine (CYMBALTA) 20 MG capsule Take  20 mg by mouth 2 (two) times daily.     empagliflozin (JARDIANCE) 25 MG TABS tablet Take 1 tablet (25 mg total) by mouth daily. 90 tablet 3   fenofibrate 160 MG tablet Take 1 tablet (160 mg total) by mouth daily. (Patient not taking: Reported on 07/09/2021) 90 tablet 1   glucose blood (ONETOUCH VERIO) test strip Use to test blood sugar 4 times daily-Dx code E11.65 400 each 0   insulin glargine (LANTUS SOLOSTAR) 100 UNIT/ML Solostar Pen Inject 10-20 units at bedtime for diabetes. 30 mL 0   insulin lispro (HUMALOG KWIKPEN) 200 UNIT/ML KwikPen Inject 10-14 units before the 3 meals of the day (Patient taking differently: Inject 10-14 Units into the skin 3 (three) times daily before meals. Per sliding scale) 18 mL 3   Insulin Syringe-Needle U-100 (B-D INSULIN SYRINGE 1CC/25GX1") 25G X 1" 1 ML MISC USE TO INJECT TESTOSTERONE WEEKLY (Patient taking differently: 1 each by Other route See admin instructions. USE TO INJECT TESTOSTERONE WEEKLY) 4 each 99   metFORMIN (GLUCOPHAGE) 1000 MG tablet Take 1 tablet 2x a day (Patient taking differently: Take 1,000 mg by mouth 2 (two) times daily with a meal.) 180 tablet 3   methadone (DOLOPHINE) 10 MG/ML solution Take 90 mg by mouth daily.     ondansetron (ZOFRAN) 4 MG tablet  Take 1 tablet (4 mg total) by mouth 2 (two) times daily for 15 days. 30 tablet 0   OneTouch Delica Lancets 60Y MISC USE TO TEST BLOOD SUGAR 4 TIMES DAILY-DX code E11.65 200 each 0   OZEMPIC, 1 MG/DOSE, 4 MG/3ML SOPN INJECT 1MG INTO THE SKIN ONCE A WEEK (Patient taking differently: Inject 1 mg into the skin once a week.) 9 mL 3   pantoprazole (PROTONIX) 40 MG tablet Take 1 tablet (40 mg total) by mouth 2 (two) times daily before a meal. 60 tablet 0   rosuvastatin (CRESTOR) 10 MG tablet TAKE 1 TABLET BY MOUTH EVERY DAY (Patient taking differently: Take 10 mg by mouth daily.) 90 tablet 2   SYRINGE-NEEDLE, DISP, 3 ML (B-D 3CC LUER-LOK SYR 18GX1-1/2) 18G X 1-1/2" 3 ML MISC USE TO DRAW UP TESTOSTERONE (Patient taking differently: 1 each by Other route See admin instructions. USE TO DRAW UP TESTOSTERONE) 1 each 1   Syringe/Needle, Disp, 18G X 1" 1 ML MISC Use to draw up testosterone (Patient taking differently: 1 each by Other route See admin instructions. Use to draw up testosterone) 100 each 5   testosterone cypionate (DEPOTESTOSTERONE CYPIONATE) 200 MG/ML injection INJECT 0.25MLS INTO THE MUSCLE ONCE WEEKLY 1 mL 1   [DISCONTINUED] insulin aspart (NOVOLOG FLEXPEN) 100 UNIT/ML FlexPen Inject 10-15 Units into the skin 3 (three) times daily with meals. 30 mL 5   No current facility-administered medications on file prior to visit.   Allergies  Allergen Reactions   Gemfibrozil Diarrhea   Family History  Problem Relation Age of Onset   Hypertension Mother    Cancer Father        not sure of type   Diabetes Father    Hypertension Father    Heart attack Father 17   Diabetes Brother    Hypertension Brother    Heart attack Brother        around 60 years of age   Kidney cancer Brother        stage 4-1 kidney removed   Colon cancer Neg Hx     PE: BP 100/60 (BP Location: Right Arm, Patient Position: Sitting, Cuff Size: Normal)  Pulse 91    Ht $R'5\' 9"'TS$  (1.753 m)    Wt 185 lb 6.4 oz (84.1 kg)    SpO2  97%    BMI 27.38 kg/m    Wt Readings from Last 3 Encounters:  08/04/21 185 lb 6.4 oz (84.1 kg)  07/27/21 185 lb (83.9 kg)  07/18/21 175 lb (79.4 kg)   Constitutional: overweight, in NAD Eyes: PERRLA, EOMI, no exophthalmos ENT: moist mucous membranes, no thyromegaly, no cervical lymphadenopathy Cardiovascular: RRR, No MRG Respiratory: CTA B Musculoskeletal: no deformities, strength intact in all 4 Skin: moist, warm, no rashes Neurological: no tremor with outstretched hands, DTR normal in all 4  ASSESSMENT: 1. DM2, insulin-dependent, uncontrolled, with complications - mild ckd  - We discussed in the past about a VGO system or another type of pump >> but he works outside and sweats a lot >> he does not think he can wear this - We have discussed about a regular insulin pump in the past, but he cannot use any of the attached devices due to the fact that he has hyperhidrosis. - We did discuss about the possibility to refer him to another endocrinologist, possibly at [facility], for a second opinion, but he refuses.   2. Hypogonadotropic hypogonadism  3.  Hyperprolactinemia  4.  History of elevated TSH  PLAN:  1.  Complex patient with very difficult to control diabetes and high insulin resistance.  His blood sugars remained uncontrolled despite trying many different treatments in the past.  Also, a CGM was to expensive in the past that he could not get  one in the past.  He was constantly eating/snacking and never being in the fasting state, not even at night.  In the past he was working excessively, approximately 20 hours a day and sleeping her nature.  Before last visit, he was terminated due to problems with his elbow.  He is still off work. -He was previously off his injectable medicines in 10/2020 and an HbA1c returned very high, at 12.2%.  After restarting his medicines, the HbA1c decreased to 8.3%. -One of the problems is that he does not appear to be very invested in his diabetes care.   I did suggest to see a counselor or psychotherapist in the past. -He was not checking sugars at last visit and I strongly advised him to start and gave him a sugar log -Since last visit, he was admitted in DKA in 06/2021.  He had a mildly high lipase at that time, at 62 and also esophagitis and duodenal ulcers.  He was also in A. fib with RVR.  We discussed that this can be a complication of uncontrolled diabetes. -At today's visit, he tells me that before his DKA admission, he was not taking medications consistently, missing days at that time.  He mentions that he always did this before, so this is most likely why his diabetes was never controlled.  After his discharge, he was started back on the previous medications, with a much lower dose of Lantus and sugars improved tremendously.  He also has a freestyle libre 3 CGM now and a predicted HbA1c from this is 6.4%!!! However, unfortunately, we could not download his CGM today as he did not know his password to the freestyle libre 3 account.  I reviewed the blood sugars in his log and they appear to be tremendously improved.  These are the best blood sugars I have seen for him!  He even has low blood sugars now and we  discussed that this is likely due to too much insulin with meals.  I advised him to reduce Humalog and increase Lantus, since now he is taking a very low dose, of 8 to 10 units daily and even missing doses at that time since sugars after dinner are too low.  There is a clear pattern of blood sugars increasing overnight, which will most likely be improved after increasing Lantus dose. -For now we will also continue his oral medications since I believe that the slightly high lipase was possibly related to his other GI pathology and not necessarily to Ozempic.  He does not have nausea or abdominal pain now. - I suggested to:  Patient Instructions  Please continue: - Metformin 1000 mg 2x daily - Jardiance 25 mg in a.m. - Ozempic 1 mg  weekly  Change: - Lantus 18 units daily - Humalog U200 8-12 units 15 min before meals  Please continue: - Testosterone injection 50 mg every 2 weeks into the muscle (draw to the 0.25 ml mark).  Please return in 3 months.  - advised to check sugars at different times of the day - 2x a day, rotating check times - advised for yearly eye exams >> he is UTD - return to clinic in 3 months  2. Hypogonadotropic Hypogonadism -No pituitary tumor per review of his pituitary MRI -He was on IM injections 50 mg weekly.  On this dose, his testosterone level was normal in 11/2017, however, after this, a testosterone level was elevated.  Upon questioning, he was trying the testosterone in the syringe to the 0.5 mL mark, but he does have a 200 mg/mL formulation of testosterone so his dose was actually 100 mg/week.  At that time, I advised him to reduce the dose to 0.25 mL (50 mg) weekly. His testosterone level was very low and, upon questioning, he was taking the recommended dose every 2 weeks, rather than every week.  I advised him to keep the same dose but take it every week but he forgot >> he was still taking it every 2 weeks.  I advised him again to take the dose weekly but he continues to prefer to take it every 2 weeks.  At last visit, his testosterone level was normal. -No side effects from testosterone replacement.  He still has low libido but no breast tension, headaches. -CBC and PSA's were normal in 10/2020.  Her CBC was also normal in 06/2021.  We will repeat the PSA  at next visit. -He has annual DRE's with his urologist.  He does have prostate hypertrophy.  3.  H/o Elevated TSH -TSH was normal at last check Lab Results  Component Value Date   TSH 0.606 07/09/2021  -No thyrotoxic signs and symptoms  Component     Latest Ref Rng & Units 08/04/2021          Glucose     65 - 99 mg/dL 98  C-Peptide     0.80 - 3.85 ng/mL 1.20  ZNT8 Antibodies     <15 U/mL <10  Islet Cell Ab     Neg:<1:1  Negative  Glutamic Acid Decarb Ab     <5 IU/mL <5  No antipancreatic autoimmunity or insulin deficiency.  Philemon Kingdom, MD PhD [facility]

## 2021-08-06 NOTE — Progress Notes (Addendum)
08/07/2021 Martin Downs [id number] May 14, 1966   Chief Complaint: Schedule an upper endoscopy, hospital follow-up  History of Present Illness: Martin Penniman. Schulenberg is a 56 year old male with with a past medical history of anxiety, depression, poorly controlled DM II, paroxysmal atrial fibrillation, hyperlipidemia, sleep apnea, chronic pain on narcotics and Methadone and adenomatous rectosigmoid polyps 2005. S/P cholecystectomy 2017.   He was admitted to the hospital 07/08/2021 with N/V, DKA with severe electrolyte derangement and afib with RVR. CTAP showed evidence of peptic ulcer disease and duodenitis. Afib was controlled with Cardizem IV. He received IV PPI and he underwent an EGD 07/10/2021 which showed severe esophagitis, gastritis and nonbleeding gastric and duodenal ulcers. He was discharged home on 07/12/2021 with instructions to continue PPI bid, Sucralfate 1 gm po qid and to repeat an EGD in 6 to 8 weeks. 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  He vomited a few times after taking the acyclovir but he was prescribed Zofran which controlled his nausea/vomiting he was able to continue the prescribed course of acyclovir.  Gastric biopsies were negative for H. Pylori, no dysplasia.   He presents her office today to schedule a follow-up upper endoscopy.  He has mild nausea a few days weekly without vomiting.  No dysphagia, heartburn or upper abdominal pain.  He took Pantoprazole 40 mg twice daily following his hospital discharge then he stopped taking it because he said his prescription ran out one year ago. It is unclear if he took Carafate or not.  He is passing normal formed brown bowel movement daily.  He has occasional constipation with straining.  No rectal bleeding or black stools.  History of small adenomatous rectosigmoid polyps per colonoscopy with Dr. Benson Norway in 2005. He underwent a colonoscopy with Dr. Ardis Hughs on 05/15/2016  which resulted in a poor prep. A repeat colonoscopy was recommended but was not done.  He does not wish to pursue a colonoscopy to assess for colon polyps/colorectal cancer at this time.  He was seen by cardiologist Dr. Garen Lah on 07/27/2021 for afib follow up, a Zio patch monitor was ordered and is currently in process.  He remains on Cardizem p.o.  He denies having any chest pain, palpitations or shortness of breath.  CBC Latest Ref Rng & Units 07/18/2021 07/12/2021 07/11/2021  WBC 4.0 - 10.5 K/uL 14.1(H) 9.2 5.7  Hemoglobin 13.0 - 17.0 g/dL 14.6 13.7 12.5(L)  Hematocrit 39.0 - 52.0 % 45.5 39.4 34.8(L)  Platelets 150.0 - 400.0 K/uL 367.0 211 173    CMP Latest Ref Rng & Units 08/04/2021 07/18/2021 07/12/2021  Glucose 65 - 99 mg/dL 98 93 107(H)  BUN 6 - 23 mg/dL - 11 6  Creatinine 0.40 - 1.50 mg/dL - 0.76 0.62  Sodium 135 - 145 mEq/L - 137 136  Potassium 3.5 - 5.1 mEq/L - 4.6 4.3  Chloride 96 - 112 mEq/L - 97 97(L)  CO2 19 - 32 mEq/L - 34(H) 31  Calcium 8.4 - 10.5 mg/dL - 9.9 8.4(L)  Total Protein 6.5 - 8.1 g/dL - - 5.4(L)  Total Bilirubin 0.3 - 1.2 mg/dL - - 0.3  Alkaline Phos 38 - 126 U/L - - 39  AST 15 - 41 U/L - - 13(L)  ALT 0 - 44 U/L - - 11    EGD 07/10/2021: - Esophagogastric landmarks identified. - 1 cm hiatal hernia. - Severe / LA Grade D esophagitis. Biopsied to rule  out viral etiology but less likely. - Non-bleeding innumerable small gastric ulcers with a clean ulcer base (Forrest Class III). - Diffuse gastritis. Biopsied. - Numerous non-bleeding / clean based duodenal ulcers with no stigmata of bleeding. - Repeat EGD in 6 to 8 weeks  Path report: A. STOMACH, ANTRUM, BIOPSY:  - Reactive gastropathy.  - Negative for active inflammation and H pylori.  - Negative for intestinal metaplasia, dysplasia, and malignancy.  B. ESOPHAGUS, BIOPSY:  - Fragments of acute ulcer exudate.  - Rare, single detached squamous cells appear positive on  immunohistochemistry for HSV1.    Colonoscopy 05/15/2016: - The procedure was aborted due to poor bowel prep. - Stool in the rectum, sigmoid and descending segments. - No specimens collected. Repeat colonoscopy at the next available appointment for surveillance. My office will contact you to set this up: will be with gatorade/miralax split dose prep with zofran $RemoveBef'4mg'MOWPmLWwgi$  pill 1 hour prior to each dose of the prep.  Colonoscopy with Dr. Benson Norway 2005 for hematochezia; three small rectosigmoid adenomas removed.  ECHO 07/10/2021: IMPRESSIONS Left ventricular ejection fraction, by estimation, is 60 to 65%. The left ventricle has normal function. The left ventricle has no regional wall motion abnormalities. There is mild left ventricular hypertrophy. Left ventricular diastolic parameters were normal. 1. Right ventricular systolic function is normal. The right ventricular size is mildly enlarged. Tricuspid regurgitation signal is inadequate for assessing PA pressure. 2. 3. Septum hypermobile. 4. The mitral valve is grossly normal. No evidence of mitral valve regurgitation. 5. The aortic valve is grossly normal. Aortic valve regurgitation is not visualized. 6. Not well visualized.  Past Medical History:  Diagnosis Date   Acute gangrenous cholecystitis s/p cholecystectomy 05/30/2016 05/31/2016   Anxiety    Borderline diabetes    Cholecystitis 05/30/2016   Chronic pain    Common bile duct repair with T-Tube 11/192017 05/31/2016   Depression    Diabetes mellitus without complication ([place])    Hyperlipidemia    Narcotic dependence ([place])    Sleep apnea    no cpap   Past Surgical History:  Procedure Laterality Date   BIOPSY  07/10/2021   Procedure: BIOPSY;  Surgeon: Yetta Flock, MD;  Location: [facility];  Service: Gastroenterology;;   CHOLECYSTECTOMY N/A 05/30/2016   Procedure: LAPAROSCOPIC CHOLECYSTECTOMY WITH INTRAOPERATIVE CHOLANGIOGRAM;  Surgeon: Donnie Mesa, MD;  Location: [facility];  Service: General;   Laterality: N/A;   CHOLECYSTECTOMY N/A 05/30/2016   Procedure: OPEN COMMON BILE DUCT EXPLORATION; INSERTION OF T-TUBE;  Surgeon: Donnie Mesa, MD;  Location: [facility];  Service: General;  Laterality: N/A;   ESOPHAGOGASTRODUODENOSCOPY (EGD) WITH PROPOFOL N/A 07/10/2021   Procedure: ESOPHAGOGASTRODUODENOSCOPY (EGD) WITH PROPOFOL;  Surgeon: Yetta Flock, MD;  Location: [facility];  Service: Gastroenterology;  Laterality: N/A;   removal of bone spur Bilateral 1995   elbps   TOOTH EXTRACTION  3491   UMBILICAL HERNIA REPAIR N/A 05/30/2016   Procedure: HERNIA REPAIR UMBILICAL ADULT;  Surgeon: Donnie Mesa, MD;  Location: [facility];  Service: General;  Laterality: N/A;   Current Outpatient Medications on File Prior to Visit  Medication Sig Dispense Refill   BD DISP NEEDLES 18G X 1-1/2" MISC USE TO DRAW UP TESTOSTERONE (Patient taking differently: 1 each by Other route See admin instructions. USE TO DRAW UP TESTOSTERONE) 4 each 24   BD INSULIN SYRINGE U/F 31G X 5/16" 0.5 ML MISC USE 3 TIMES DAILY (Patient taking differently: 1 each by Other route See admin instructions. USE 3 TIMES DAILY) 100 each  0   Blood Glucose Monitoring Suppl (ONE TOUCH ULTRA MINI) W/DEVICE KIT Use as directed to test blood sugar twice daily 250.00 (Patient taking differently: 1 each by Other route See admin instructions. Use as directed to test blood sugar twice daily 250.00) 1 each 0   Continuous Blood Gluc Sensor (FREESTYLE LIBRE 3 SENSOR) MISC 1 Device by Does not apply route continuous. Place 1 sensor on the skin every 14 days. Use to check glucose continuously 2 each 4   diltiazem (CARDIZEM CD) 120 MG 24 hr capsule Take 1 capsule (120 mg total) by mouth daily. 30 capsule 0   divalproex (DEPAKOTE ER) 500 MG 24 hr tablet Take 500 mg by mouth daily.     DULoxetine (CYMBALTA) 20 MG capsule Take 20 mg by mouth 2 (two) times daily.     empagliflozin (JARDIANCE) 25 MG TABS tablet Take 1 tablet (25 mg total) by mouth daily. 90  tablet 3   fenofibrate 160 MG tablet Take 1 tablet (160 mg total) by mouth daily. 90 tablet 3   glucose blood (ONETOUCH VERIO) test strip Use to test blood sugar 4 times daily-Dx code E11.65 400 each 0   insulin glargine (LANTUS SOLOSTAR) 100 UNIT/ML Solostar Pen Inject 18  units at bedtime for diabetes. 30 mL 0   insulin lispro (HUMALOG KWIKPEN) 200 UNIT/ML KwikPen Inject 10-14 units before the 3 meals of the day (Patient taking differently: Inject 10-14 Units into the skin 3 (three) times daily before meals. Per sliding scale) 18 mL 3   Insulin Pen Needle 32G X 4 MM MISC Use 4x a day 300 each 3   Insulin Syringe-Needle U-100 (B-D INSULIN SYRINGE 1CC/25GX1") 25G X 1" 1 ML MISC USE TO INJECT TESTOSTERONE WEEKLY (Patient taking differently: 1 each by Other route See admin instructions. USE TO INJECT TESTOSTERONE WEEKLY) 4 each 99   metFORMIN (GLUCOPHAGE) 1000 MG tablet Take 1 tablet 2x a day (Patient taking differently: Take 1,000 mg by mouth 2 (two) times daily with a meal.) 180 tablet 3   methadone (DOLOPHINE) 10 MG/ML solution Take 90 mg by mouth daily.     OneTouch Delica Lancets 98Y MISC USE TO TEST BLOOD SUGAR 4 TIMES DAILY-DX code E11.65 200 each 0   OZEMPIC, 1 MG/DOSE, 4 MG/3ML SOPN INJECT $RemoveBef'1MG'EFWvbFkwaD$  INTO THE SKIN ONCE A WEEK (Patient taking differently: Inject 1 mg into the skin once a week.) 9 mL 3   rosuvastatin (CRESTOR) 10 MG tablet TAKE 1 TABLET BY MOUTH EVERY DAY (Patient taking differently: Take 10 mg by mouth daily.) 90 tablet 2   SYRINGE-NEEDLE, DISP, 3 ML (B-D 3CC LUER-LOK SYR 18GX1-1/2) 18G X 1-1/2" 3 ML MISC USE TO DRAW UP TESTOSTERONE (Patient taking differently: 1 each by Other route See admin instructions. USE TO DRAW UP TESTOSTERONE) 1 each 1   Syringe/Needle, Disp, 18G X 1" 1 ML MISC Use to draw up testosterone (Patient taking differently: 1 each by Other route See admin instructions. Use to draw up testosterone) 100 each 5   testosterone cypionate (DEPOTESTOSTERONE CYPIONATE) 200  MG/ML injection INJECT 0.25MLS INTO THE MUSCLE ONCE WEEKLY 1 mL 1   [DISCONTINUED] insulin aspart (NOVOLOG FLEXPEN) 100 UNIT/ML FlexPen Inject 10-15 Units into the skin 3 (three) times daily with meals. 30 mL 5   No current facility-administered medications on file prior to visit.   Allergies  Allergen Reactions   Gemfibrozil Diarrhea   Current Medications, Allergies, Past Medical History, Past Surgical History, Family History and Social History were reviewed  in [employer] record.  Review of Systems:   Constitutional: Negative for fever, sweats, chills or weight loss.  Respiratory: Negative for shortness of breath.   Cardiovascular: Negative for chest pain, palpitations and leg swelling.  Gastrointestinal: See HPI.  Musculoskeletal: Negative for back pain or muscle aches.  Neurological: Negative for dizziness, headaches or paresthesias.   Physical Exam: BP 104/64 (BP Location: Left Arm, Patient Position: Sitting, Cuff Size: Normal)    Pulse 100    Ht 5' 8.25" (1.734 m) Comment: height measured without shoes   Wt 186 lb 2 oz (84.4 kg)    BMI 28.09 kg/m  General: 56 year old male in no acute distress. Head: Normocephalic and atraumatic. Eyes: No scleral icterus. Conjunctiva pink . Ears: Normal auditory acuity. Mouth: Dentition intact. No ulcers or lesions.  Lungs: Clear throughout to auscultation. Heart: Regular rate and rhythm, no murmur. Abdomen: Soft, nontender and nondistended. No masses or hepatomegaly. Normal bowel sounds x 4 quadrants. Large RUQ scar, past drain tube scar intact.  Rectal: Deferred. Musculoskeletal: Symmetrical with no gross deformities. Extremities: No edema. Neurological: Alert oriented x 4. No focal deficits.  Psychological: Alert and cooperative. Normal mood and affect  Assessment and Recommendations:  10) 56 year old male admitted to the hospital 07/08/2021 with N/V, DKA and afib with RVR. EGD done during this admission identified  nonbleeding gastric and duodenal ulcers. No evidence of H. Pylori. He  was prescribed Pantoprazole 40 mg twice daily and Sulcalfate qid.  Esophageal biopsies were + for HSV1 treated with Acyclovir. Advised to repeat an EGD in 6 to 8 weeks. He took PPI bid for at least 3 weeks then stopped taking it about one week ago when his prescription ran out.  -EGD prescheduled 09/13/2021. EGD benefits and risks discussed including risk with sedation, risk of bleeding, perforation and infection  -Cardiac clearance required prior to patient proceeding with an EGD -BMP 2 days prior to EGD -Continue Pantoprazole 40mg  po bid  2) History of adenomatous rectosigmoid polyps per colonoscopy with Dr. Benson Norway 2005.  Colonoscopy 05/15/2016 resulted in a poor prep, repeat colonoscopy was recommended but was not done.  Patient does not wish to schedule a colonoscopy at this time. -Patient to follow-up in the office with Dr. Ardis Hughs 2 to 3 months after EGD completed, to discuss future colon polyp surveillance colonoscopies at that time  3) Atrial fibrillation (newly diagnosed during his hospital admission for DKA 06/2021) on Diltiazem. CHA2DS2-VASc of 1. Not on anticoagulation. Ziopatch in process.  -Cardiac clearance requested prior to the patient proceeding to EGD as noted above  4) DM II, poorly controlled. Admitted to the hospital with DKA 07/08/2021, glucose of 539, bicarbonate of 11, beta hydroxybutyrate of 6.6.  ADDENDUM:  Cardiac clearance received by Dr. Garen Lah, patient to proceed with EGD as scheduled.

## 2021-08-07 ENCOUNTER — Encounter: Payer: Self-pay | Admitting: Nurse Practitioner

## 2021-08-07 ENCOUNTER — Ambulatory Visit: Payer: 59 | Admitting: Nurse Practitioner

## 2021-08-07 ENCOUNTER — Telehealth: Payer: Self-pay

## 2021-08-07 VITALS — BP 104/64 | HR 100 | Ht 68.25 in | Wt 186.1 lb

## 2021-08-07 DIAGNOSIS — I4891 Unspecified atrial fibrillation: Secondary | ICD-10-CM

## 2021-08-07 DIAGNOSIS — K253 Acute gastric ulcer without hemorrhage or perforation: Secondary | ICD-10-CM

## 2021-08-07 DIAGNOSIS — K269 Duodenal ulcer, unspecified as acute or chronic, without hemorrhage or perforation: Secondary | ICD-10-CM | POA: Diagnosis not present

## 2021-08-07 LAB — ANTI-ISLET CELL ANTIBODY: Islet Cell Ab: NEGATIVE

## 2021-08-07 MED ORDER — PANTOPRAZOLE SODIUM 40 MG PO TBEC: 40.0000 mg | DELAYED_RELEASE_TABLET | Freq: Two times a day (BID) | ORAL | 1 refills | Status: DC

## 2021-08-07 MED ORDER — ONDANSETRON 4 MG PO TBDP: ORAL_TABLET | ORAL | 0 refills | Status: DC

## 2021-08-07 NOTE — Telephone Encounter (Deleted)
Request for surgical clearance:     Endoscopy Procedure  What type of surgery is being performed?     ***  When is this surgery scheduled?     ***  What type of clearance is required ?   Pharmacy  Are there any medications that need to be held prior to surgery and how long? ***  Practice name and name of physician performing surgery?      [facility]  What is your office phone and fax number?      Phone- [phone]  [phone]  Anesthesia type (None, local, MAC, general) ?       MAC

## 2021-08-07 NOTE — Patient Instructions (Addendum)
PROCEDURES: You have been scheduled for a EGD. Please follow the written instructions given to you at your visit today. If you use inhalers (even only as needed), please bring them with you on the day of your procedure.  MEDICATION: We have sent the following medication to your pharmacy for you to pick up at your convenience: Pantoprazole 40 MG twice a day. Ondansetron (ZOFRAN ODT) 4 MG disintegrating tablet- Place one tablet under the tongue every 6-8 hours as needed for nausea or vomiting.  RECOMMENDATIONS: We have sent for cardiac clearance. Stop by the lab on 09/11/21 for repeat lab test before your procedure. Miralax- Dissolve one capful in 8 ounces of water and drink before bed. Please contact our office if symptoms worsen.  It was great seeing you today! Thank you for entrusting me with your care and choosing [facility]  Arnaldo Natal, CRNP  The [facility] providers would like to encourage you to use [facility] to communicate with providers for non-urgent requests or questions.  Due to long hold times on the telephone, sending your provider a message by [facility] may be faster and more efficient way to get a response. Please allow 48 business hours for a response.  Please remember that this is for non-urgent requests/questions.  If you are age 36 or older, your body mass index should be between 23-30. Your Body mass index is 28.09 kg/m. If this is out of the aforementioned range listed, please consider follow up with your Primary Care Provider.  If you are age 25 or younger, your body mass index should be between 19-25. Your Body mass index is 28.09 kg/m. If this is out of the aformentioned range listed, please consider follow up with your Primary Care Provider.

## 2021-08-07 NOTE — Telephone Encounter (Signed)
Request for surgical clearance:     Endoscopy Procedure  What type of surgery is being performed?     EGD  When is this surgery scheduled?     09/13/21  What type of clearance is required ?   Cardiac clearance required   Practice name and name of physician performing surgery?      [facility]  What is your office phone and fax number?      Phone- [phone]  [phone]  Anesthesia type (None, local, MAC, general) ?       MAC

## 2021-08-07 NOTE — Progress Notes (Signed)
I agree with the above note, plan 

## 2021-08-08 ENCOUNTER — Telehealth: Payer: Self-pay | Admitting: Cardiology

## 2021-08-08 NOTE — Telephone Encounter (Signed)
Attempted to call the patient. No answer- I left a detailed message of results on his voice mail (ok per DPR).   I advised that in reviewing his chart it looks like Dr. Elvera Lennox might have sent a RX to his pharmacy on 1/6 for fenofibrate 160 mg once daily.   I asked that he call back to confirm if he has restarted this medication

## 2021-08-08 NOTE — Telephone Encounter (Signed)
Martin Odea, MD  08/08/2021 10:59 AM EST     Triglycerides elevated, restart fenofibrate 160 mg daily.

## 2021-08-10 LAB — GLUTAMIC ACID DECARBOXYLASE AUTO ABS: Glutamic Acid Decarb Ab: <5 IU/mL (ref <5)

## 2021-08-10 LAB — GLUCOSE, FASTING: Glucose, Bld: 98 mg/dL (ref 65–99)

## 2021-08-10 LAB — C-PEPTIDE: C-Peptide: 1.2 ng/mL (ref 0.80–3.85)

## 2021-08-10 LAB — ZNT8 ANTIBODIES: ZNT8 Antibodies: <10 U/mL (ref <15)

## 2021-08-10 NOTE — Telephone Encounter (Signed)
Called patient and left a detailed VM per DPR on file requesting a call back or a MyChart message to inform us if he is taking Fenofibrate or if he needs [place] to send it in for him.

## 2021-08-10 NOTE — Telephone Encounter (Signed)
Excellent

## 2021-08-15 NOTE — Telephone Encounter (Signed)
Called patient and he confirmed that he is taking Fenofibrate 160 MG daily, and reviewed result note. Patient grateful for follow up.

## 2021-09-11 ENCOUNTER — Other Ambulatory Visit (INDEPENDENT_AMBULATORY_CARE_PROVIDER_SITE_OTHER): Payer: 59

## 2021-09-11 DIAGNOSIS — K269 Duodenal ulcer, unspecified as acute or chronic, without hemorrhage or perforation: Secondary | ICD-10-CM | POA: Diagnosis not present

## 2021-09-11 DIAGNOSIS — I4891 Unspecified atrial fibrillation: Secondary | ICD-10-CM | POA: Diagnosis not present

## 2021-09-11 DIAGNOSIS — K253 Acute gastric ulcer without hemorrhage or perforation: Secondary | ICD-10-CM

## 2021-09-12 LAB — BASIC METABOLIC PANEL
BUN: 12 mg/dL (ref 6–23)
CO2: 38 mEq/L — ABNORMAL HIGH (ref 19–32)
Calcium: 9.7 mg/dL (ref 8.4–10.5)
Chloride: 102 mEq/L (ref 96–112)
Creatinine, Ser: 0.95 mg/dL (ref 0.40–1.50)
GFR: 88.94 mL/min (ref >60.00)
Glucose, Bld: 81 mg/dL (ref 70–99)
Potassium: 4.8 mEq/L (ref 3.5–5.1)
Sodium: 140 mEq/L (ref 135–145)

## 2021-09-13 ENCOUNTER — Ambulatory Visit (AMBULATORY_SURGERY_CENTER): Payer: 59 | Admitting: Gastroenterology

## 2021-09-13 ENCOUNTER — Other Ambulatory Visit: Payer: Self-pay

## 2021-09-13 ENCOUNTER — Encounter: Payer: Self-pay | Admitting: Gastroenterology

## 2021-09-13 VITALS — BP 113/74 | HR 83 | Temp 98.6°F | Resp 10 | Ht 68.0 in | Wt 186.0 lb

## 2021-09-13 DIAGNOSIS — K2970 Gastritis, unspecified, without bleeding: Secondary | ICD-10-CM | POA: Diagnosis present

## 2021-09-13 DIAGNOSIS — K269 Duodenal ulcer, unspecified as acute or chronic, without hemorrhage or perforation: Secondary | ICD-10-CM

## 2021-09-13 MED ORDER — SODIUM CHLORIDE 0.9 % IV SOLN: 500.0000 mL | Freq: Once | INTRAVENOUS | Status: DC

## 2021-09-13 NOTE — Patient Instructions (Signed)
You may resume your previous diet.  Reduce your pantoprazole (Protonix) to once a day and avoid aspirin, naproxen, ibupofen, or any other NSAIDs.  Thank you for allowing [place] to care for you today!!!   YOU HAD AN ENDOSCOPIC PROCEDURE TODAY AT THE [facility]:   Refer to the procedure report that was given to you for any specific questions about what was found during the examination.  If the procedure report does not answer your questions, please call your gastroenterologist to clarify.  If you requested that your care partner not be given the details of your procedure findings, then the procedure report has been included in a sealed envelope for you to review at your convenience later.  YOU SHOULD EXPECT: Some feelings of bloating in the abdomen. Passage of more gas than usual.  Walking can help get rid of the air that was put into your GI tract during the procedure and reduce the bloating.   Please Note:  You might notice some irritation and congestion in your nose or some drainage.  This is from the oxygen used during your procedure.  There is no need for concern and it should clear up in a day or so.  SYMPTOMS TO REPORT IMMEDIATELY:   Following upper endoscopy (EGD)  Vomiting of blood or coffee ground material  New chest pain or pain under the shoulder blades  Painful or persistently difficult swallowing  New shortness of breath  Fever of 100F or higher  Black, tarry-looking stools  For urgent or emergent issues, a gastroenterologist can be reached at any hour by calling [phone]. Do not use MyChart messaging for urgent concerns.    DIET:  We do recommend a small meal at first, but then you may proceed to your regular diet.  Drink plenty of fluids but you should avoid alcoholic beverages for 24 hours.  ACTIVITY:  You should plan to take it easy for the rest of today and you should NOT DRIVE or use heavy machinery until tomorrow (because of the sedation medicines used  during the test).    FOLLOW UP: Our staff will call the number listed on your records Friday morning between 7:15 am and 8:15 am following your procedure to check on you and address any questions or concerns that you may have regarding the information given to you following your procedure. If we do not reach you, we will leave a message.  We will attempt to reach you two times.  During this call, we will ask if you have developed any symptoms of COVID 19. If you develop any symptoms (ie: fever, flu-like symptoms, shortness of breath, cough etc.) before then, please call [phone].  If you test positive for Covid 19 in the 2 weeks post procedure, please call and report this information to [place].    If any biopsies were taken you will be contacted by phone or by letter within the next 1-3 weeks.  Please call us at [phone] if you have not heard about the biopsies in 3 weeks.    SIGNATURES/CONFIDENTIALITY: You and/or your care partner have signed paperwork which will be entered into your electronic medical record.  These signatures attest to the fact that that the information above on your After Visit Summary has been reviewed and is understood.  Full responsibility of the confidentiality of this discharge information lies with you and/or your care-partner.

## 2021-09-13 NOTE — Op Note (Signed)
[place] Patient Name: Martin Downs Procedure Date: 09/13/2021 7:26 AM MRN: [id number] Endoscopist: Milus Banister , MD Age: 56 Referring MD:  Date of Birth: [birth year] Gender: Male Account #: [id number] Procedure:                Upper GI endoscopy Indications:              EGD 06/2021 found gastric and duodenal ulcers,                            severe ulcerative esophagitis. He was taking 6-12                            advil daily for years Medicines:                Monitored Anesthesia Care Procedure:                Pre-Anesthesia Assessment:                           - Prior to the procedure, a History and Physical                            was performed, and patient medications and                            allergies were reviewed. The patient's tolerance of                            previous anesthesia was also reviewed. The risks                            and benefits of the procedure and the sedation                            options and risks were discussed with the patient.                            All questions were answered, and informed consent                            was obtained. Prior Anticoagulants: The patient has                            taken no previous anticoagulant or antiplatelet                            agents. ASA Grade Assessment: II - A patient with                            mild systemic disease. After reviewing the risks                            and benefits, the patient was deemed in  satisfactory condition to undergo the procedure.                           After obtaining informed consent, the endoscope was                            passed under direct vision. Throughout the                            procedure, the patient's blood pressure, pulse, and                            oxygen saturations were monitored continuously. The                            GIF [id number] was introduced  through the                            mouth, and advanced to the second part of duodenum.                            The upper GI endoscopy was accomplished without                            difficulty. The patient tolerated the procedure                            well. Scope In: Scope Out: Findings:                 The esophagus was normal.                           Mild, non-specific gatritis throughout the stomach.                            Not biopsied since 06/2021 biopsies showed no H.                            pylori.                           The examined duodenum was normal. Complications:            No immediate complications. Estimated blood loss:                            None. Estimated Blood Loss:     Estimated blood loss: none. Impression:               - Mild, non-specific pangastritis.                           - The severe ulcerative esophagitis and multiple                            gastroduodenal ulcers have all completely healed. Recommendation:           -  Patient has a contact number available for                            emergencies. The signs and symptoms of potential                            delayed complications were discussed with the                            patient. Return to normal activities tomorrow.                            Written discharge instructions were provided to the                            patient.                           - Resume previous diet.                           - Continue present medications. You can decrease                            your pantopraozle from twice a day down to once a                            day dosing. This is best taken 20-30 minutes before                            your breakfast meal daily.                           - Need to continue to avoid NSAID type pain                            medicines. Milus Banister, MD 09/13/2021 8:19:07 AM This report has been signed electronically.

## 2021-09-13 NOTE — Progress Notes (Signed)
Report to PACU, RN, vss, BBS= Clear.  

## 2021-09-13 NOTE — Progress Notes (Signed)
Vs CW  Pt's states no medical or surgical changes since previsit or office visit.  

## 2021-09-13 NOTE — Progress Notes (Signed)
HPI: This is a man with recent EGD finding gastric, duodenal ulcers, HSV+   ROS: complete GI ROS as described in HPI, all other review negative.  Constitutional:  No unintentional weight loss   Past Medical History:  Diagnosis Date   Acute gangrenous cholecystitis s/p cholecystectomy 05/30/2016 05/31/2016   Anxiety    Borderline diabetes    Cholecystitis 05/30/2016   Chronic pain    Common bile duct repair with T-Tube 11/192017 05/31/2016   Depression    Diabetes mellitus without complication ([place])    Hyperlipidemia    Narcotic dependence ([place])    Sleep apnea    no cpap    Past Surgical History:  Procedure Laterality Date   BIOPSY  07/10/2021   Procedure: BIOPSY;  Surgeon: Yetta Flock, MD;  Location: [facility];  Service: Gastroenterology;;   CHOLECYSTECTOMY N/A 05/30/2016   Procedure: LAPAROSCOPIC CHOLECYSTECTOMY WITH INTRAOPERATIVE CHOLANGIOGRAM;  Surgeon: Donnie Mesa, MD;  Location: [facility];  Service: General;  Laterality: N/A;   CHOLECYSTECTOMY N/A 05/30/2016   Procedure: OPEN COMMON BILE DUCT EXPLORATION; INSERTION OF T-TUBE;  Surgeon: Donnie Mesa, MD;  Location: [facility];  Service: General;  Laterality: N/A;   ESOPHAGOGASTRODUODENOSCOPY (EGD) WITH PROPOFOL N/A 07/10/2021   Procedure: ESOPHAGOGASTRODUODENOSCOPY (EGD) WITH PROPOFOL;  Surgeon: Yetta Flock, MD;  Location: [facility];  Service: Gastroenterology;  Laterality: N/A;   removal of bone spur Bilateral 1995   elbps   TOOTH EXTRACTION  0175   UMBILICAL HERNIA REPAIR N/A 05/30/2016   Procedure: HERNIA REPAIR UMBILICAL ADULT;  Surgeon: Donnie Mesa, MD;  Location: [facility];  Service: General;  Laterality: N/A;    Current Outpatient Medications  Medication Sig Dispense Refill   BD INSULIN SYRINGE U/F 31G X 5/16" 0.5 ML MISC USE 3 TIMES DAILY (Patient taking differently: 1 each by Other route See admin instructions. USE 3 TIMES DAILY) 100 each 0   Blood Glucose Monitoring Suppl (ONE TOUCH ULTRA MINI)  W/DEVICE KIT Use as directed to test blood sugar twice daily 250.00 (Patient taking differently: 1 each by Other route See admin instructions. Use as directed to test blood sugar twice daily 250.00) 1 each 0   Continuous Blood Gluc Sensor (FREESTYLE LIBRE 3 SENSOR) MISC 1 Device by Does not apply route continuous. Place 1 sensor on the skin every 14 days. Use to check glucose continuously 2 each 4   diltiazem (CARDIZEM CD) 120 MG 24 hr capsule Take 1 capsule (120 mg total) by mouth daily. 30 capsule 0   divalproex (DEPAKOTE ER) 500 MG 24 hr tablet Take 500 mg by mouth daily.     DULoxetine (CYMBALTA) 20 MG capsule Take 20 mg by mouth 2 (two) times daily.     empagliflozin (JARDIANCE) 25 MG TABS tablet Take 1 tablet (25 mg total) by mouth daily. 90 tablet 3   fenofibrate 160 MG tablet Take 1 tablet (160 mg total) by mouth daily. 90 tablet 3   glucose blood (ONETOUCH VERIO) test strip Use to test blood sugar 4 times daily-Dx code E11.65 400 each 0   insulin glargine (LANTUS SOLOSTAR) 100 UNIT/ML Solostar Pen Inject 18  units at bedtime for diabetes. 30 mL 0   insulin lispro (HUMALOG KWIKPEN) 200 UNIT/ML KwikPen Inject 10-14 units before the 3 meals of the day (Patient taking differently: Inject 10-14 Units into the skin 3 (three) times daily before meals. Per sliding scale) 18 mL 3   Insulin Pen Needle 32G X 4 MM MISC Use 4x a day 300 each  3   Insulin Syringe-Needle U-100 (B-D INSULIN SYRINGE 1CC/25GX1") 25G X 1" 1 ML MISC USE TO INJECT TESTOSTERONE WEEKLY (Patient taking differently: 1 each by Other route See admin instructions. USE TO INJECT TESTOSTERONE WEEKLY) 4 each 99   metFORMIN (GLUCOPHAGE) 1000 MG tablet Take 1 tablet 2x a day (Patient taking differently: Take 1,000 mg by mouth 2 (two) times daily with a meal.) 180 tablet 3   methadone (DOLOPHINE) 10 MG/ML solution Take 90 mg by mouth daily.     OneTouch Delica Lancets 64R MISC USE TO TEST BLOOD SUGAR 4 TIMES DAILY-DX code E11.65 200 each 0    OZEMPIC, 1 MG/DOSE, 4 MG/3ML SOPN INJECT $RemoveBef'1MG'UinhQJwkFT$  INTO THE SKIN ONCE A WEEK (Patient taking differently: Inject 1 mg into the skin once a week.) 9 mL 3   pantoprazole (PROTONIX) 40 MG tablet Take 1 tablet (40 mg total) by mouth 2 (two) times daily before a meal. Take 30 minutes before breakfast. 60 tablet 1   rosuvastatin (CRESTOR) 10 MG tablet TAKE 1 TABLET BY MOUTH EVERY DAY (Patient taking differently: Take 10 mg by mouth daily.) 90 tablet 2   SYRINGE-NEEDLE, DISP, 3 ML (B-D 3CC LUER-LOK SYR 18GX1-1/2) 18G X 1-1/2" 3 ML MISC USE TO DRAW UP TESTOSTERONE (Patient taking differently: 1 each by Other route See admin instructions. USE TO DRAW UP TESTOSTERONE) 1 each 1   Syringe/Needle, Disp, 18G X 1" 1 ML MISC Use to draw up testosterone (Patient taking differently: 1 each by Other route See admin instructions. Use to draw up testosterone) 100 each 5   BD DISP NEEDLES 18G X 1-1/2" MISC USE TO DRAW UP TESTOSTERONE (Patient taking differently: 1 each by Other route See admin instructions. USE TO DRAW UP TESTOSTERONE) 4 each 24   ondansetron (ZOFRAN-ODT) 4 MG disintegrating tablet Dissolve one tablet on the tongue every 6- 8 hours as needed for nausea and vomiting. 20 tablet 0   testosterone cypionate (DEPOTESTOSTERONE CYPIONATE) 200 MG/ML injection INJECT 0.25MLS INTO THE MUSCLE ONCE WEEKLY 1 mL 1   Current Facility-Administered Medications  Medication Dose Route Frequency Provider Last Rate Last Admin   0.9 %  sodium chloride infusion  500 mL Intravenous Once Milus Banister, MD        Allergies as of 09/13/2021 - Review Complete 09/13/2021  Allergen Reaction Noted   Gemfibrozil Diarrhea     Family History  Problem Relation Age of Onset   Hypertension Mother    Cancer Father        not sure of type   Diabetes Father    Hypertension Father    Heart attack Father 12   Diabetes Brother    Hypertension Brother    Heart attack Brother        around 25 years of age   Kidney cancer Brother         stage 4-1 kidney removed   Colon cancer Neg Hx     Social History   Socioeconomic History   Marital status: Married    Spouse name: Amy   Number of children: 2   Years of education: Not on file   Highest education level: High school graduate  Occupational History   Occupation: Music therapist: UNEMPLOYED  Tobacco Use   Smoking status: Never   Smokeless tobacco: Former    Types: Snuff    Quit date: 03/30/2020  Vaping Use   Vaping Use: Never used  Substance and Sexual Activity   Alcohol use: No  Comment: quit 2009   Drug use: Not Currently   Sexual activity: Yes    Birth control/protection: Post-menopausal  Other Topics Concern   Not on file  Social History Narrative   11/23/19   From: the area   Living: with wife Amy, high school sweet hearts   Work: Music therapist, works Warehouse manager for a [employer]      Family: 2 children - Wellsite geologist and Sport and exercise psychologist - grown and out of the house      Enjoys: not sure, works too much, watch TV      Exercise: walking at his job - bending/lifting   Diet: tries to do diabetic diet - but only partially, low appetite      Safety   Seat belts: Yes    Guns: declined   Safe in relationships: Yes    Social Determinants of Radio broadcast assistant Strain: Not on file  Food Insecurity: Not on file  Transportation Needs: Not on file  Physical Activity: Not on file  Stress: Not on file  Social Connections: Not on file  Intimate Partner Violence: Not on file     Physical Exam: BP 129/83    Pulse (!) 103    Temp 98.6 F (37 C)    Ht $R'5\' 8"'Zw$  (1.727 m)    Wt 186 lb (84.4 kg)    SpO2 97%    BMI 28.28 kg/m  Constitutional: generally well-appearing Psychiatric: alert and oriented x3 Lungs: CTA bilaterally Heart: no MCR  Assessment and plan: 56 y.o. male with recent EGD with duodenal, gastric ulcers, severe esophagityis  EGD today  Care is appropriate for the ambulatory setting.  Owens Loffler, MD [facility] 09/13/2021, 7:44 AM

## 2021-09-15 ENCOUNTER — Telehealth: Payer: Self-pay

## 2021-09-15 NOTE — Telephone Encounter (Signed)
First post procedure follow up call, no answer 

## 2021-09-15 NOTE — Telephone Encounter (Signed)
Second post procedure follow up call, no answer 

## 2021-10-27 ENCOUNTER — Ambulatory Visit: Payer: 59 | Admitting: Internal Medicine

## 2021-10-30 ENCOUNTER — Ambulatory Visit: Payer: 59 | Admitting: Cardiology

## 2022-12-20 ENCOUNTER — Encounter: Payer: Self-pay | Admitting: Gastroenterology

## 2022-12-20 ENCOUNTER — Telehealth: Payer: Self-pay | Admitting: Gastroenterology

## 2023-01-01 ENCOUNTER — Telehealth: Payer: Self-pay

## 2023-01-01 NOTE — Telephone Encounter (Signed)
Hey Dr. Adela Lank,   This patient had cardiac clearance for a EGD in 2023. The patient hasn't had a updated echo since then. Would you like [place] to reach out for a update clearance or is his previous one ok?

## 2023-01-01 NOTE — Telephone Encounter (Signed)
I think he is okay to proceed as long as no active cardiac issues. His last echo looked okay. Thanks

## 2023-01-01 NOTE — Telephone Encounter (Signed)
error 

## 2023-01-09 ENCOUNTER — Ambulatory Visit (AMBULATORY_SURGERY_CENTER): Payer: Non-veteran care

## 2023-01-09 VITALS — Ht 69.0 in | Wt 220.0 lb

## 2023-01-09 DIAGNOSIS — Z1211 Encounter for screening for malignant neoplasm of colon: Secondary | ICD-10-CM

## 2023-01-09 MED ORDER — ONDANSETRON HCL 4 MG PO TABS: 4.0000 mg | ORAL_TABLET | ORAL | 0 refills | Status: DC

## 2023-01-09 MED ORDER — NA SULFATE-K SULFATE-MG SULF 17.5-3.13-1.6 GM/177ML PO SOLN: 1.0000 | Freq: Once | ORAL | 0 refills | Status: AC

## 2023-01-09 NOTE — Progress Notes (Signed)
Pre visit completed via phone call; Patient verified name, DOB, and address; Patient reports weight as 220 lbs during PV appt; No egg or soy allergy known to patient;  No issues known to pt with past sedation with any surgeries or procedures; Patient denies ever being told they had issues or difficulty with intubation;  No FH of Malignant Hyperthermia; Pt is not on diet pills; Pt is not on home 02;  Pt is not on blood thinners;  Pt reports issues with constipation - takes Colace daily; patient informed to increase oral fluids, fruits/veggies that are allowed prior to prep, and activity;  AFIB with RVR dx hx=no meds Have any cardiac testing pending--NO Pt instructed to use Singlecare.com or GoodRx for a price reduction on prep   Insurance verified during PV appt=[place]  Patient's chart reviewed by Cathlyn Parsons CNRA prior to previsit and patient appropriate for the [facility].  Previsit completed and red dot placed by patient's name on their procedure day (on provider's schedule).    Instructions printed and mailed to patient per his request;   Patient reports his Methadone is red in color =patient informed not to take the morning of procedure, however, he can take as soon as he gets home if Dr. Adela Lank anesthesia allows; Patient verbalized understanding of information/instructions;  Patient advised to call back to the office at [phone] should questions/concerns arise;    Zofran instructions hand written on prep instructions after printing as patient reports nausea and vomiting with last prep;

## 2023-01-29 ENCOUNTER — Telehealth: Payer: Self-pay | Admitting: Internal Medicine

## 2023-01-29 ENCOUNTER — Telehealth: Payer: Self-pay | Admitting: Gastroenterology

## 2023-01-29 NOTE — Telephone Encounter (Signed)
Received a page to the [facility] GI On Call pager. Patient has a colonoscopy scheduled with Dr. Adela Lank tomorrow. Patient states that he drank the first half of his colonoscopy prep 3 hours ago and has not yet had a bowel movement. He does have constipation at times. I recommended that he try to take some Miralax to try aid the colonoscopy prep. Patient states that he has a little over half of a bottle of 238 grams of Miralax available. I recommended that he mix 32 ounces of Gatorade (patient states he has orange Gatorade at home) with the remaining Miralax he has left and drink that now. He should then do the second half of the original colonoscopy prep at 3 AM as previously instructed. Patient understands and will proceed with drinking his Miralax now. Will CC Dr. Adela Lank to this telephone note.

## 2023-01-29 NOTE — Telephone Encounter (Signed)
Inbound call from patient requesting a call back, patient stated his sugar level  is getting low and he is schedule for a procedure tomorrow .Marland KitchenSeeking for advise

## 2023-01-29 NOTE — Telephone Encounter (Signed)
Returned call to patient. He states that his blood sugar is 90, he is feeling fine but becomes symptomatic if BS drops bellow 80. He held insulin and metformin today. Advised patient to drink regular juice such as apple, white grape or orange without the pulp. Encouraged patient to call back with any questions or problems. He agreed to POC

## 2023-01-30 ENCOUNTER — Ambulatory Visit (AMBULATORY_SURGERY_CENTER): Payer: No Typology Code available for payment source | Admitting: Gastroenterology

## 2023-01-30 ENCOUNTER — Encounter: Payer: Self-pay | Admitting: Gastroenterology

## 2023-01-30 VITALS — BP 113/62 | HR 67 | Temp 98.0°F | Resp 13 | Ht 69.0 in | Wt 220.0 lb

## 2023-01-30 DIAGNOSIS — D125 Benign neoplasm of sigmoid colon: Secondary | ICD-10-CM | POA: Diagnosis not present

## 2023-01-30 DIAGNOSIS — Z86010 Personal history of colonic polyps: Secondary | ICD-10-CM

## 2023-01-30 DIAGNOSIS — Z09 Encounter for follow-up examination after completed treatment for conditions other than malignant neoplasm: Secondary | ICD-10-CM

## 2023-01-30 DIAGNOSIS — D124 Benign neoplasm of descending colon: Secondary | ICD-10-CM | POA: Diagnosis not present

## 2023-01-30 DIAGNOSIS — D123 Benign neoplasm of transverse colon: Secondary | ICD-10-CM

## 2023-01-30 DIAGNOSIS — D122 Benign neoplasm of ascending colon: Secondary | ICD-10-CM

## 2023-01-30 MED ORDER — SODIUM CHLORIDE 0.9 % IV SOLN
500.0000 mL | INTRAVENOUS | Status: DC
Start: 2023-01-30 — End: 2023-01-30

## 2023-01-30 NOTE — Telephone Encounter (Signed)
Thanks for your help Alan Ripper, appreciate it.

## 2023-01-30 NOTE — Progress Notes (Signed)
Called to room to assist during endoscopic procedure.  Patient ID and intended procedure confirmed with present staff. Received instructions for my participation in the procedure from the performing physician.  

## 2023-01-30 NOTE — Op Note (Signed)
[facility] Patient Name: Martin Downs Procedure Date: 01/30/2023 8:25 AM MRN: [id number] Endoscopist: Viviann Spare P. Adela Lank , MD, [id number] Age: 57 Referring MD:  Date of Birth: [birth year] Gender: Male Account #: [id number] Procedure:                Colonoscopy Indications:              High risk colon cancer surveillance: Personal                            history of colonic polyps - adenoma removed 2005,                            last exam 2017 with inadequate prep Medicines:                Monitored Anesthesia Care Procedure:                Pre-Anesthesia Assessment:                           - Prior to the procedure, a History and Physical                            was performed, and patient medications and                            allergies were reviewed. The patient's tolerance of                            previous anesthesia was also reviewed. The risks                            and benefits of the procedure and the sedation                            options and risks were discussed with the patient.                            All questions were answered, and informed consent                            was obtained. Prior Anticoagulants: The patient has                            taken no anticoagulant or antiplatelet agents. ASA                            Grade Assessment: III - A patient with severe                            systemic disease. After reviewing the risks and                            benefits, the patient was deemed in satisfactory  condition to undergo the procedure.                           After obtaining informed consent, the colonoscope                            was passed under direct vision. Throughout the                            procedure, the patient's blood pressure, pulse, and                            oxygen saturations were monitored continuously. The                            Olympus Scope SN:  [id number] was introduced through                            the anus and advanced to the the cecum, identified                            by appendiceal orifice and ileocecal valve. The                            colonoscopy was performed without difficulty. The                            patient tolerated the procedure well. The quality                            of the bowel preparation was adequate. The                            ileocecal valve, appendiceal orifice, and rectum                            were photographed. Scope In: 8:37:35 AM Scope Out: 9:12:49 AM Scope Withdrawal Time: 0 hours 22 minutes 32 seconds  Total Procedure Duration: 0 hours 35 minutes 14 seconds  Findings:                 The perianal and digital rectal examinations were                            normal.                           A large amount of liquid stool was found in the                            entire colon, making visualization difficult.                            Extensive lavage of the colon was performed using  copious amounts of sterile water, resulting in                            clearance with adequate visualization. This process                            prolonged the exam                           Two sessile polyps were found in the ascending                            colon. The polyps were 3 to 5 mm in size. These                            polyps were removed with a cold snare. Resection                            and retrieval were complete.                           Two sessile polyps were found in the hepatic                            flexure. The polyps were 3 to 4 mm in size. These                            polyps were removed with a cold snare. Resection                            and retrieval were complete.                           Two sessile polyps were found in the transverse                            colon. The polyps were 5 to 7 mm in size.  These                            polyps were removed with a cold snare. Resection                            and retrieval were complete.                           Four sessile polyps were found in the descending                            colon. The polyps were 3 to 5 mm in size. These                            polyps were removed with a cold snare. Resection  and retrieval were complete.                           Two sessile polyps were found in the sigmoid colon.                            The polyps were 3 to 4 mm in size. These polyps                            were removed with a cold snare. Resection and                            retrieval were complete.                           Internal hemorrhoids were found during retroflexion.                           The colon was markedly spastic which prolonged the                            exam.                           The exam was otherwise without abnormality. Complications:            No immediate complications. Estimated blood loss:                            Minimal. Estimated Blood Loss:     Estimated blood loss was minimal. Impression:               - Stool in the entire examined colon leading to                            extensive lavage.                           - Two 3 to 5 mm polyps in the ascending colon,                            removed with a cold snare. Resected and retrieved.                           - Two 3 to 4 mm polyps at the hepatic flexure,                            removed with a cold snare. Resected and retrieved.                           - Two 5 to 7 mm polyps in the transverse colon,                            removed with a cold snare. Resected and retrieved.                           -  Four 3 to 5 mm polyps in the descending colon,                            removed with a cold snare. Resected and retrieved.                           - Two 3 to 4 mm polyps in the sigmoid  colon,                            removed with a cold snare. Resected and retrieved.                           - Internal hemorrhoids.                           - Colonic spasm                           - The examination was otherwise normal. Recommendation:           - Patient has a contact number available for                            emergencies. The signs and symptoms of potential                            delayed complications were discussed with the                            patient. Return to normal activities tomorrow.                            Written discharge instructions were provided to the                            patient.                           - Resume previous diet.                           - Continue present medications.                           - Await pathology results.                           - Double prep should be used for future exams Gabrille Kilbride P. Delicia Berens, MD 01/30/2023 9:22:11 AM This report has been signed electronically.

## 2023-01-30 NOTE — Progress Notes (Signed)
To pacu, VSS. Report to rn.tb °

## 2023-01-30 NOTE — Progress Notes (Signed)
Pt's states no medical or surgical changes since previsit or office visit. 

## 2023-01-30 NOTE — Progress Notes (Signed)
[facility] History and Physical   Primary Care Physician:  Gweneth Dimitri, MD   Reason for Procedure:   History of colon polyps  Plan:    colonoscopy     HPI: Martin Downs is a 57 y.o. male  here for colonoscopy surveillance - adenomas removed in 2005. Last exam 2017 limited by poor prep.    Patient does have some mild constipation at baseline. No family history of colon cancer known. Otherwise feels well without any cardiopulmonary symptoms.   I have discussed risks / benefits of anesthesia and endoscopic procedure with Dimas Chyle and they wish to proceed with the exams as outlined today.    Past Medical History:  Diagnosis Date   Acute gangrenous cholecystitis s/p cholecystectomy 05/30/2016 05/31/2016   Anxiety    on meds   Borderline diabetes    Cholecystitis 05/30/2016   Chronic pain    Common bile duct repair with T-Tube 11/192017 05/31/2016   Depression    on meds   Diabetes mellitus without complication (HCC)    on meds   Hyperlipidemia    on meds   Narcotic dependence (HCC)    Sleep apnea    no cpap    Past Surgical History:  Procedure Laterality Date   BIOPSY  07/10/2021   Procedure: BIOPSY;  Surgeon: Benancio Deeds, MD;  Location: [facility];  Service: Gastroenterology;;   CHOLECYSTECTOMY N/A 05/30/2016   Procedure: LAPAROSCOPIC CHOLECYSTECTOMY WITH INTRAOPERATIVE CHOLANGIOGRAM;  Surgeon: Manus Rudd, MD;  Location: [facility] OR;  Service: General;  Laterality: N/A;   CHOLECYSTECTOMY N/A 05/30/2016   Procedure: OPEN COMMON BILE DUCT EXPLORATION; INSERTION OF T-TUBE;  Surgeon: Manus Rudd, MD;  Location: [facility] OR;  Service: General;  Laterality: N/A;   COLONOSCOPY  2017   DJ-MAC-aborted due to poor prep   ESOPHAGOGASTRODUODENOSCOPY (EGD) WITH PROPOFOL N/A 07/10/2021   Procedure: ESOPHAGOGASTRODUODENOSCOPY (EGD) WITH PROPOFOL;  Surgeon: Benancio Deeds, MD;  Location: [facility] ENDOSCOPY;  Service: Gastroenterology;  Laterality:  N/A;   removal of bone spur Bilateral 1995   elbps   TOOTH EXTRACTION  2017   UMBILICAL HERNIA REPAIR N/A 05/30/2016   Procedure: HERNIA REPAIR UMBILICAL ADULT;  Surgeon: Manus Rudd, MD;  Location: [facility] OR;  Service: General;  Laterality: N/A;    Prior to Admission medications   Medication Sig Start Date End Date Taking? Authorizing Provider  BD DISP NEEDLES 18G X 1-1/2" MISC USE TO DRAW UP TESTOSTERONE Patient taking differently: 1 each by Other route See admin instructions. USE TO DRAW UP TESTOSTERONE 08/26/19  Yes Carlus Pavlov, MD  BD INSULIN SYRINGE U/F 31G X 5/16" 0.5 ML MISC USE 3 TIMES DAILY Patient taking differently: 1 each by Other route See admin instructions. USE 3 TIMES DAILY 09/19/17  Yes Carlus Pavlov, MD  Blood Glucose Monitoring Suppl (ONE TOUCH ULTRA MINI) W/DEVICE KIT Use as directed to test blood sugar twice daily 250.00 Patient taking differently: 1 each by Other route See admin instructions. Use as directed to test blood sugar twice daily 250.00 04/29/14  Yes Dianne Dun, MD  busPIRone (BUSPAR) 15 MG tablet Take 15 mg by mouth 3 (three) times daily. 11/23/22  Yes [provider]  Continuous Blood Gluc Sensor (FREESTYLE LIBRE 3 SENSOR) MISC 1 Device by Does not apply route continuous. Place 1 sensor on the skin every 14 days. Use to check glucose continuously 07/18/21  Yes Doreene Nest, NP  diltiazem (CARDIZEM CD) 120 MG 24 hr capsule Take 1 capsule (120 mg  total) by mouth daily. 07/12/21  Yes Glade Lloyd, MD  empagliflozin (JARDIANCE) 25 MG TABS tablet Take 25 mg by mouth daily. 04/27/22  Yes [provider]  fenofibrate 160 MG tablet Take 1 tablet (160 mg total) by mouth daily. 08/04/21  Yes Carlus Pavlov, MD  glucose blood (ONETOUCH VERIO) test strip Use to test blood sugar 4 times daily-Dx code E11.65 07/14/21  Yes Carlus Pavlov, MD  INSULIN ASPART FLEXPEN [place] Inject into the skin in the morning, at noon, and at bedtime. INJECT 34  UNITS SUBCUTANEOUSLY BEFORE BREAKFAST AND INJECT 10 UNITS BEFORE LUNCH AND INJECT 10 UNITS BEFORE SUPPER (REPLACES HUMALOG) 09/20/22  Yes [provider]  insulin glargine-yfgn (SEMGLEE) 100 UNIT/ML Pen Inject into the skin 2 (two) times daily. 30 units at breakfast , 5-10 units at dinner 09/20/22  Yes [provider]  Insulin Pen Needle 32G X 4 MM MISC Use 4x a day 08/04/21  Yes Carlus Pavlov, MD  Insulin Syringe-Needle U-100 (B-D INSULIN SYRINGE 1CC/25GX1") 25G X 1" 1 ML MISC USE TO INJECT TESTOSTERONE WEEKLY Patient taking differently: 1 each by Other route See admin instructions. USE TO INJECT TESTOSTERONE WEEKLY 08/26/19  Yes Carlus Pavlov, MD  metFORMIN (GLUCOPHAGE-XR) 500 MG 24 hr tablet Take 1,000 mg by mouth 2 (two) times daily with a meal. 03/08/22  Yes [provider]  methadone (DOLOPHINE) 10 MG/ML solution Take 90 mg by mouth daily.   Yes [provider]  naproxen (NAPROSYN) 500 MG tablet Take 500 mg by mouth daily as needed. 10/31/22  Yes [provider]  Nutritional Supplements (GLUCOSE MANAGEMENT) TABS Take 1 tablet by mouth as needed (low blood glucose). 09/08/22  Yes [provider]  ondansetron (ZOFRAN) 4 MG tablet Take 1 tablet (4 mg total) by mouth as directed. Take as directed on prep instructions 01/09/23  Yes Ayo Guarino, Willaim Rayas, MD  OneTouch Delica Lancets 33G MISC USE TO TEST BLOOD SUGAR 4 TIMES DAILY-DX code E11.65 07/14/21  Yes Carlus Pavlov, MD  rosuvastatin (CRESTOR) 20 MG tablet Take 0.5 tablets by mouth daily. 04/27/22  Yes [provider]  sertraline (ZOLOFT) 100 MG tablet Take 200 mg by mouth daily. 10/02/22  Yes [provider]  SYRINGE-NEEDLE, DISP, 3 ML (B-D 3CC LUER-LOK SYR 18GX1-1/2) 18G X 1-1/2" 3 ML MISC USE TO DRAW UP TESTOSTERONE Patient taking differently: 1 each by Other route See admin instructions. USE TO DRAW UP TESTOSTERONE 11/14/18  Yes Carlus Pavlov, MD  Syringe/Needle, Disp,  18G X 1" 1 ML MISC Use to draw up testosterone Patient taking differently: 1 each by Other route See admin instructions. Use to draw up testosterone 03/04/17  Yes Carlus Pavlov, MD  testosterone cypionate (DEPOTESTOSTERONE CYPIONATE) 200 MG/ML injection INJECT 0.25MLS INTO THE MUSCLE ONCE WEEKLY Patient taking differently: Inject 0.6 mLs into the skin once a week. 03/21/18  Yes Carlus Pavlov, MD  tiZANidine (ZANAFLEX) 4 MG tablet Take 4 mg by mouth every 8 (eight) hours as needed for muscle spasms. 10/31/22  Yes [provider]  traZODone (DESYREL) 100 MG tablet Take 100 mg by mouth at bedtime. 11/23/22  Yes [provider]  Docusate Sodium (DSS) 100 MG CAPS Take 1 capsule by mouth daily at 6 (six) AM. Patient not taking: Reported on 01/30/2023 10/24/22   [provider]    Current Outpatient Medications  Medication Sig Dispense Refill   BD DISP NEEDLES 18G X 1-1/2" MISC USE TO DRAW UP TESTOSTERONE (Patient taking differently: 1 each by Other route See  admin instructions. USE TO DRAW UP TESTOSTERONE) 4 each 24   BD INSULIN SYRINGE U/F 31G X 5/16" 0.5 ML MISC USE 3 TIMES DAILY (Patient taking differently: 1 each by Other route See admin instructions. USE 3 TIMES DAILY) 100 each 0   Blood Glucose Monitoring Suppl (ONE TOUCH ULTRA MINI) W/DEVICE KIT Use as directed to test blood sugar twice daily 250.00 (Patient taking differently: 1 each by Other route See admin instructions. Use as directed to test blood sugar twice daily 250.00) 1 each 0   busPIRone (BUSPAR) 15 MG tablet Take 15 mg by mouth 3 (three) times daily.     Continuous Blood Gluc Sensor (FREESTYLE LIBRE 3 SENSOR) MISC 1 Device by Does not apply route continuous. Place 1 sensor on the skin every 14 days. Use to check glucose continuously 2 each 4   diltiazem (CARDIZEM CD) 120 MG 24 hr capsule Take 1 capsule (120 mg total) by mouth daily. 30 capsule 0   empagliflozin (JARDIANCE) 25 MG TABS tablet Take 25 mg by mouth  daily.     fenofibrate 160 MG tablet Take 1 tablet (160 mg total) by mouth daily. 90 tablet 3   glucose blood (ONETOUCH VERIO) test strip Use to test blood sugar 4 times daily-Dx code E11.65 400 each 0   INSULIN ASPART FLEXPEN [place] Inject into the skin in the morning, at noon, and at bedtime. INJECT 34 UNITS SUBCUTANEOUSLY BEFORE BREAKFAST AND INJECT 10 UNITS BEFORE LUNCH AND INJECT 10 UNITS BEFORE SUPPER (REPLACES HUMALOG)     insulin glargine-yfgn (SEMGLEE) 100 UNIT/ML Pen Inject into the skin 2 (two) times daily. 30 units at breakfast , 5-10 units at dinner     Insulin Pen Needle 32G X 4 MM MISC Use 4x a day 300 each 3   Insulin Syringe-Needle U-100 (B-D INSULIN SYRINGE 1CC/25GX1") 25G X 1" 1 ML MISC USE TO INJECT TESTOSTERONE WEEKLY (Patient taking differently: 1 each by Other route See admin instructions. USE TO INJECT TESTOSTERONE WEEKLY) 4 each 99   metFORMIN (GLUCOPHAGE-XR) 500 MG 24 hr tablet Take 1,000 mg by mouth 2 (two) times daily with a meal.     methadone (DOLOPHINE) 10 MG/ML solution Take 90 mg by mouth daily.     naproxen (NAPROSYN) 500 MG tablet Take 500 mg by mouth daily as needed.     Nutritional Supplements (GLUCOSE MANAGEMENT) TABS Take 1 tablet by mouth as needed (low blood glucose).     ondansetron (ZOFRAN) 4 MG tablet Take 1 tablet (4 mg total) by mouth as directed. Take as directed on prep instructions 2 tablet 0   OneTouch Delica Lancets 33G MISC USE TO TEST BLOOD SUGAR 4 TIMES DAILY-DX code E11.65 200 each 0   rosuvastatin (CRESTOR) 20 MG tablet Take 0.5 tablets by mouth daily.     sertraline (ZOLOFT) 100 MG tablet Take 200 mg by mouth daily.     SYRINGE-NEEDLE, DISP, 3 ML (B-D 3CC LUER-LOK SYR 18GX1-1/2) 18G X 1-1/2" 3 ML MISC USE TO DRAW UP TESTOSTERONE (Patient taking differently: 1 each by Other route See admin instructions. USE TO DRAW UP TESTOSTERONE) 1 each 1   Syringe/Needle, Disp, 18G X 1" 1 ML MISC Use to draw up testosterone (Patient taking differently: 1 each by  Other route See admin instructions. Use to draw up testosterone) 100 each 5   testosterone cypionate (DEPOTESTOSTERONE CYPIONATE) 200 MG/ML injection INJECT 0.25MLS INTO THE MUSCLE ONCE WEEKLY (Patient taking differently: Inject 0.6 mLs into the skin once a week.) 1  mL 1   tiZANidine (ZANAFLEX) 4 MG tablet Take 4 mg by mouth every 8 (eight) hours as needed for muscle spasms.     traZODone (DESYREL) 100 MG tablet Take 100 mg by mouth at bedtime.     Docusate Sodium (DSS) 100 MG CAPS Take 1 capsule by mouth daily at 6 (six) AM. (Patient not taking: Reported on 01/30/2023)     Current Facility-Administered Medications  Medication Dose Route Frequency Provider Last Rate Last Admin   0.9 %  sodium chloride infusion  500 mL Intravenous Continuous Braxten Memmer, Willaim Rayas, MD        Allergies as of 01/30/2023 - Review Complete 01/30/2023  Allergen Reaction Noted   Bupropion  10/24/2022   Gemfibrozil Diarrhea    Semaglutide Nausea And Vomiting 11/24/2021    Family History  Problem Relation Age of Onset   Hypertension Mother    Cancer Father        not sure of type   Diabetes Father    Hypertension Father    Heart attack Father 20   Diabetes Brother    Hypertension Brother    Heart attack Brother        around 10 years of age   Kidney cancer Brother        stage 4-1 kidney removed   Colon cancer Neg Hx     Social History   Socioeconomic History   Marital status: Married    Spouse name: Amy   Number of children: 2   Years of education: Not on file   Highest education level: High school graduate  Occupational History   Occupation: Sports administrator: UNEMPLOYED  Tobacco Use   Smoking status: Never   Smokeless tobacco: Former    Types: Snuff    Quit date: 03/30/2020  Vaping Use   Vaping Use: Never used  Substance and Sexual Activity   Alcohol use: No    Comment: quit 2009   Drug use: Not Currently   Sexual activity: Yes    Birth control/protection: Post-menopausal  Other  Topics Concern   Not on file  Social History Narrative   11/23/19   From: the area   Living: with wife Amy, high school sweet hearts   Work: Librarian, academic, 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      Family: 2 children - Engineer, structural and Paediatric nurse - grown and out of the house      Enjoys: not sure, works too much, watch TV      Exercise: walking at his job - bending/lifting   Diet: tries to do diabetic diet - but only partially, low appetite      Safety   Seat belts: Yes    Guns: declined   Safe in relationships: Yes    Social Determinants of Corporate investment banker Strain: Not on file  Food Insecurity: Not on file  Transportation Needs: Not on file  Physical Activity: Not on file  Stress: Not on file  Social Connections: Not on file  Intimate Partner Violence: Not on file    Review of Systems: All other review of systems negative except as mentioned in the HPI.  Physical Exam: Vital signs BP 133/72   Pulse 66   Temp 98 F (36.7 C) (Temporal)   Ht 5\' 9"  (1.753 m)   Wt 220 lb (99.8 kg)   SpO2 98%   BMI 32.49 kg/m   General:   Alert,  Well-developed, pleasant and cooperative in NAD  Lungs:  Clear throughout to auscultation.   Heart:  Regular rate and rhythm Abdomen:  Soft, nontender and nondistended.   Neuro/Psych:  Alert and cooperative. Normal mood and affect. A and O x 3  Harlin Rain, MD [facility]

## 2023-01-30 NOTE — Patient Instructions (Signed)
YOU HAD AN ENDOSCOPIC PROCEDURE TODAY AT THE [facility]:   Refer to the procedure report that was given to you for any specific questions about what was found during the examination.  If the procedure report does not answer your questions, please call your gastroenterologist to clarify.  If you requested that your care partner not be given the details of your procedure findings, then the procedure report has been included in a sealed envelope for you to review at your convenience later.  YOU SHOULD EXPECT: Some feelings of bloating in the abdomen. Passage of more gas than usual.  Walking can help get rid of the air that was put into your GI tract during the procedure and reduce the bloating. If you had a lower endoscopy (such as a colonoscopy or flexible sigmoidoscopy) you may notice spotting of blood in your stool or on the toilet paper. If you underwent a bowel prep for your procedure, you may not have a normal bowel movement for a few days.  Please Note:  You might notice some irritation and congestion in your nose or some drainage.  This is from the oxygen used during your procedure.  There is no need for concern and it should clear up in a day or so.  SYMPTOMS TO REPORT IMMEDIATELY:  Following lower endoscopy (colonoscopy or flexible sigmoidoscopy):  Excessive amounts of blood in the stool  Significant tenderness or worsening of abdominal pains  Swelling of the abdomen that is new, acute  Fever of 100F or higher  For urgent or emergent issues, a gastroenterologist can be reached at any hour by calling [phone]. Do not use MyChart messaging for urgent concerns.    DIET:  We do recommend a small meal at first, but then you may proceed to your regular diet.  Drink plenty of fluids but you should avoid alcoholic beverages for 24 hours.  ACTIVITY:  You should plan to take it easy for the rest of today and you should NOT DRIVE or use heavy machinery until tomorrow (because of  the sedation medicines used during the test).    FOLLOW UP: Our staff will call the number listed on your records the next business day following your procedure.  We will call around 7:15- 8:00 am to check on you and address any questions or concerns that you may have regarding the information given to you following your procedure. If we do not reach you, we will leave a message.     If any biopsies were taken you will be contacted by phone or by letter within the next 1-3 weeks.  Please call us at [phone] if you have not heard about the biopsies in 3 weeks.    SIGNATURES/CONFIDENTIALITY: You and/or your care partner have signed paperwork which will be entered into your electronic medical record.  These signatures attest to the fact that that the information above on your After Visit Summary has been reviewed and is understood.  Full responsibility of the confidentiality of this discharge information lies with you and/or your care-partner.  

## 2023-02-01 ENCOUNTER — Telehealth: Payer: Self-pay | Admitting: *Deleted

## 2023-02-01 NOTE — Telephone Encounter (Signed)
  Follow up Call-     01/30/2023    7:40 AM 09/13/2021    7:19 AM  Call back number  Post procedure Call Back phone  # [phone]  Permission to leave phone message Yes Yes   LMOM

## 2023-04-11 ENCOUNTER — Telehealth: Payer: Self-pay | Admitting: *Deleted

## 2023-04-11 NOTE — Telephone Encounter (Signed)
Dr Adela Lank gave recommendation on 02/10/23 that patient be offered a referral to genetic counselor for genetic testing given burden of polyps on this current colonoscopy exam and previous exams. Patient gets large majority of his medical care with [facility]. Therefore, we were required to send our recommendations to his providers there instead of referring to genetics directly. Primary care did discuss genetics counseling with the patient.    Per Care Everywhere [facility] Note dated 02/25/23:  02/25/2023 ADDENDUM STATUS: COMPLETED Spoke with veteran regarding genetic counseling for colon polyps. Veteran  states at this time he does not want to do genetic counseling. Discussed pros  and cons, veteran still states he does not want to do genetic counseling at this  time.  /es/ Doyce Para Physician Assistant - Certified Signed: 02/25/2023 12:55

## 2024-06-09 ENCOUNTER — Emergency Department (HOSPITAL_COMMUNITY)

## 2024-06-09 ENCOUNTER — Inpatient Hospital Stay (HOSPITAL_COMMUNITY)
Admission: EM | Admit: 2024-06-09 | Discharge: 2024-06-11 | DRG: 683 | Disposition: A | Discharge status: Home / Self Care | Attending: Internal Medicine | Admitting: Internal Medicine

## 2024-06-09 ENCOUNTER — Other Ambulatory Visit: Payer: Self-pay

## 2024-06-09 DIAGNOSIS — R109 Unspecified abdominal pain: Secondary | ICD-10-CM | POA: Diagnosis present

## 2024-06-09 DIAGNOSIS — R112 Nausea with vomiting, unspecified: Secondary | ICD-10-CM | POA: Diagnosis present

## 2024-06-09 DIAGNOSIS — R1084 Generalized abdominal pain: Secondary | ICD-10-CM

## 2024-06-09 DIAGNOSIS — G8929 Other chronic pain: Secondary | ICD-10-CM | POA: Diagnosis present

## 2024-06-09 DIAGNOSIS — G4730 Sleep apnea, unspecified: Secondary | ICD-10-CM | POA: Diagnosis present

## 2024-06-09 DIAGNOSIS — Z833 Family history of diabetes mellitus: Secondary | ICD-10-CM

## 2024-06-09 DIAGNOSIS — D72829 Elevated white blood cell count, unspecified: Secondary | ICD-10-CM | POA: Diagnosis present

## 2024-06-09 DIAGNOSIS — T383X5A Adverse effect of insulin and oral hypoglycemic [antidiabetic] drugs, initial encounter: Secondary | ICD-10-CM | POA: Diagnosis present

## 2024-06-09 DIAGNOSIS — Z79899 Other long term (current) drug therapy: Secondary | ICD-10-CM

## 2024-06-09 DIAGNOSIS — F32A Depression, unspecified: Secondary | ICD-10-CM | POA: Diagnosis present

## 2024-06-09 DIAGNOSIS — Z79890 Hormone replacement therapy (postmenopausal): Secondary | ICD-10-CM

## 2024-06-09 DIAGNOSIS — Z888 Allergy status to other drugs, medicaments and biological substances status: Secondary | ICD-10-CM

## 2024-06-09 DIAGNOSIS — Z8249 Family history of ischemic heart disease and other diseases of the circulatory system: Secondary | ICD-10-CM

## 2024-06-09 DIAGNOSIS — E785 Hyperlipidemia, unspecified: Secondary | ICD-10-CM | POA: Diagnosis present

## 2024-06-09 DIAGNOSIS — E860 Dehydration: Secondary | ICD-10-CM | POA: Diagnosis present

## 2024-06-09 DIAGNOSIS — E119 Type 2 diabetes mellitus without complications: Secondary | ICD-10-CM | POA: Diagnosis present

## 2024-06-09 DIAGNOSIS — N179 Acute kidney failure, unspecified: Secondary | ICD-10-CM | POA: Diagnosis not present

## 2024-06-09 DIAGNOSIS — R9431 Abnormal electrocardiogram [ECG] [EKG]: Secondary | ICD-10-CM | POA: Diagnosis present

## 2024-06-09 DIAGNOSIS — Z794 Long term (current) use of insulin: Secondary | ICD-10-CM

## 2024-06-09 DIAGNOSIS — E872 Acidosis, unspecified: Secondary | ICD-10-CM | POA: Diagnosis not present

## 2024-06-09 DIAGNOSIS — Z7984 Long term (current) use of oral hypoglycemic drugs: Secondary | ICD-10-CM

## 2024-06-09 DIAGNOSIS — Z1152 Encounter for screening for COVID-19: Secondary | ICD-10-CM

## 2024-06-09 DIAGNOSIS — D751 Secondary polycythemia: Secondary | ICD-10-CM | POA: Diagnosis present

## 2024-06-09 DIAGNOSIS — K5900 Constipation, unspecified: Secondary | ICD-10-CM | POA: Diagnosis present

## 2024-06-09 DIAGNOSIS — F419 Anxiety disorder, unspecified: Secondary | ICD-10-CM | POA: Diagnosis present

## 2024-06-09 DIAGNOSIS — Z9049 Acquired absence of other specified parts of digestive tract: Secondary | ICD-10-CM

## 2024-06-09 DIAGNOSIS — Z87891 Personal history of nicotine dependence: Secondary | ICD-10-CM

## 2024-06-09 LAB — CBC WITH DIFFERENTIAL/PLATELET
Abs Immature Granulocytes: 0.06 K/uL (ref 0.00–0.07)
Basophils Absolute: 0.1 K/uL (ref 0.0–0.1)
Basophils Relative: 1 %
Eosinophils Absolute: 0.1 K/uL (ref 0.0–0.5)
Eosinophils Relative: 1 %
HCT: 53.1 % — ABNORMAL HIGH (ref 39.0–52.0)
Hemoglobin: 17.7 g/dL — ABNORMAL HIGH (ref 13.0–17.0)
Immature Granulocytes: 1 %
Lymphocytes Relative: 16 %
Lymphs Abs: 1.8 K/uL (ref 0.7–4.0)
MCH: 30.2 pg (ref 26.0–34.0)
MCHC: 33.3 g/dL (ref 30.0–36.0)
MCV: 90.6 fL (ref 80.0–100.0)
Monocytes Absolute: 0.9 K/uL (ref 0.1–1.0)
Monocytes Relative: 8 %
Neutro Abs: 7.9 K/uL — ABNORMAL HIGH (ref 1.7–7.7)
Neutrophils Relative %: 73 %
Platelets: 259 K/uL (ref 150–400)
RBC: 5.86 MIL/uL — ABNORMAL HIGH (ref 4.22–5.81)
RDW: 12.3 % (ref 11.5–15.5)
WBC: 10.8 K/uL — ABNORMAL HIGH (ref 4.0–10.5)
nRBC: 0 % (ref 0.0–0.2)

## 2024-06-09 LAB — I-STAT VENOUS BLOOD GAS, ED
Acid-base deficit: 1 mmol/L (ref 0.0–2.0)
Bicarbonate: 20.7 mmol/L (ref 20.0–28.0)
Calcium, Ion: 1.09 mmol/L — ABNORMAL LOW (ref 1.15–1.40)
HCT: 54 % — ABNORMAL HIGH (ref 39.0–52.0)
Hemoglobin: 18.4 g/dL — ABNORMAL HIGH (ref 13.0–17.0)
O2 Saturation: 50 %
Potassium: 3.4 mmol/L — ABNORMAL LOW (ref 3.5–5.1)
Sodium: 135 mmol/L (ref 135–145)
TCO2: 22 mmol/L (ref 22–32)
pCO2, Ven: 29 mmHg — ABNORMAL LOW (ref 44–60)
pH, Ven: 7.462 — ABNORMAL HIGH (ref 7.25–7.43)
pO2, Ven: 25 mmHg — CL (ref 32–45)

## 2024-06-09 LAB — RESP PANEL BY RT-PCR (RSV, FLU A&B, COVID)  RVPGX2
Influenza A by PCR: NEGATIVE
Influenza B by PCR: NEGATIVE
Resp Syncytial Virus by PCR: NEGATIVE
SARS Coronavirus 2 by RT PCR: NEGATIVE

## 2024-06-09 LAB — COMPREHENSIVE METABOLIC PANEL WITH GFR
ALT: 43 U/L (ref 0–44)
AST: 34 U/L (ref 15–41)
Albumin: 4.5 g/dL (ref 3.5–5.0)
Alkaline Phosphatase: 42 U/L (ref 38–126)
Anion gap: 19 — ABNORMAL HIGH (ref 5–15)
BUN: 15 mg/dL (ref 6–20)
CO2: 21 mmol/L — ABNORMAL LOW (ref 22–32)
Calcium: 9.8 mg/dL (ref 8.9–10.3)
Chloride: 95 mmol/L — ABNORMAL LOW (ref 98–111)
Creatinine, Ser: 1.37 mg/dL — ABNORMAL HIGH (ref 0.61–1.24)
GFR, Estimated: 59 mL/min — ABNORMAL LOW (ref >60)
Glucose, Bld: 181 mg/dL — ABNORMAL HIGH (ref 70–99)
Potassium: 3.4 mmol/L — ABNORMAL LOW (ref 3.5–5.1)
Sodium: 135 mmol/L (ref 135–145)
Total Bilirubin: 1.6 mg/dL — ABNORMAL HIGH (ref 0.0–1.2)
Total Protein: 7.4 g/dL (ref 6.5–8.1)

## 2024-06-09 LAB — LIPASE, BLOOD: Lipase: 25 U/L (ref 11–51)

## 2024-06-09 LAB — CBG MONITORING, ED
Glucose-Capillary: 199 mg/dL — ABNORMAL HIGH (ref 70–99)
Glucose-Capillary: 152 mg/dL — ABNORMAL HIGH (ref 70–99)

## 2024-06-09 LAB — ETHANOL: Alcohol, Ethyl (B): <15 mg/dL (ref <15)

## 2024-06-09 MED ORDER — LACTATED RINGERS IV BOLUS
1000.0000 mL | Freq: Once | INTRAVENOUS | Status: AC
  Administered 2024-06-10: 1000 mL via INTRAVENOUS

## 2024-06-09 MED ORDER — ONDANSETRON HCL 4 MG/2ML IJ SOLN
4.0000 mg | Freq: Once | INTRAMUSCULAR | Status: AC
  Administered 2024-06-10: 4 mg via INTRAVENOUS
  Filled 2024-06-09: qty 2

## 2024-06-09 NOTE — ED Provider Notes (Signed)
 [place] EMERGENCY DEPARTMENT AT [facility] Provider Note   CSN: [id number] Arrival date & time: 06/09/24  8069     Patient presents with: Abdominal Pain and Nausea   Martin Downs is a 58 y.o. male.   Patient presents with general ill feeling.  Patient reports that he has been feeling very poor today with nausea, vomiting, sweats, shakes.  He has noticed urinary frequency for the last couple of weeks, no dysuria or frequency.  He reports that he has diffuse right sided abdominal pain.       Prior to Admission medications   Medication Sig Start Date End Date Taking? Authorizing Provider  BD DISP NEEDLES 18G X 1-1/2 MISC USE TO DRAW UP TESTOSTERONE  Patient taking differently: 1 each by Other route See admin instructions. USE TO DRAW UP TESTOSTERONE  08/26/19   Trixie File, MD  BD INSULIN  SYRINGE U/F 31G X 5/16 0.5 ML MISC USE 3 TIMES DAILY Patient taking differently: 1 each by Other route See admin instructions. USE 3 TIMES DAILY 09/19/17   Trixie File, MD  Blood Glucose Monitoring Suppl (ONE TOUCH ULTRA MINI) W/DEVICE KIT Use as directed to test blood sugar twice daily 250.00 Patient taking differently: 1 each by Other route See admin instructions. Use as directed to test blood sugar twice daily 250.00 04/29/14   Aron, Talia M, MD  busPIRone  (BUSPAR ) 15 MG tablet Take 15 mg by mouth 3 (three) times daily. 11/23/22   [provider]  Continuous Blood Gluc Sensor (FREESTYLE LIBRE 3 SENSOR) MISC 1 Device by Does not apply route continuous. Place 1 sensor on the skin every 14 days. Use to check glucose continuously 07/18/21   Gretta Comer POUR, NP  diltiazem  (CARDIZEM  CD) 120 MG 24 hr capsule Take 1 capsule (120 mg total) by mouth daily. 07/12/21   Cheryle Page, MD  Docusate Sodium  (DSS) 100 MG CAPS Take 1 capsule by mouth daily at 6 (six) AM. Patient not taking: Reported on 01/30/2023 10/24/22   [provider]  empagliflozin  (JARDIANCE ) 25  MG TABS tablet Take 25 mg by mouth daily. 04/27/22   [provider]  fenofibrate  160 MG tablet Take 1 tablet (160 mg total) by mouth daily. 08/04/21   Trixie File, MD  glucose blood (ONETOUCH VERIO) test strip Use to test blood sugar 4 times daily-Dx code E11.65 07/14/21   Trixie File, MD  INSULIN  ASPART FLEXPEN [place] Inject into the skin in the morning, at noon, and at bedtime. INJECT 34 UNITS SUBCUTANEOUSLY BEFORE BREAKFAST AND INJECT 10 UNITS BEFORE LUNCH AND INJECT 10 UNITS BEFORE SUPPER (REPLACES HUMALOG ) 09/20/22   [provider]  insulin  glargine-yfgn (SEMGLEE ) 100 UNIT/ML Pen Inject into the skin 2 (two) times daily. 30 units at breakfast , 5-10 units at dinner 09/20/22   [provider]  Insulin  Pen Needle 32G X 4 MM MISC Use 4x a day 08/04/21   Trixie File, MD  Insulin  Syringe-Needle U-100 (B-D INSULIN  SYRINGE 1CC/25GX1) 25G X 1 1 ML MISC USE TO INJECT TESTOSTERONE  WEEKLY Patient taking differently: 1 each by Other route See admin instructions. USE TO INJECT TESTOSTERONE  WEEKLY 08/26/19   Trixie File, MD  metFORMIN  (GLUCOPHAGE -XR) 500 MG 24 hr tablet Take 1,000 mg by mouth 2 (two) times daily with a meal. 03/08/22   [provider]  methadone  (DOLOPHINE ) 10 MG/ML solution Take 90 mg by mouth daily.    [provider]  naproxen (NAPROSYN) 500 MG tablet Take 500 mg by mouth daily as  needed. 10/31/22   [provider]  Nutritional Supplements (GLUCOSE MANAGEMENT) TABS Take 1 tablet by mouth as needed (low blood glucose). 09/08/22   [provider]  ondansetron  (ZOFRAN ) 4 MG tablet Take 1 tablet (4 mg total) by mouth as directed. Take as directed on prep instructions 01/09/23   Armbruster, Elspeth SQUIBB, MD  OneTouch Delica Lancets 33G MISC USE TO TEST BLOOD SUGAR 4 TIMES DAILY-DX code E11.65 07/14/21   Trixie File, MD  rosuvastatin  (CRESTOR ) 20 MG tablet Take 0.5 tablets by mouth daily. 04/27/22   [provider]   sertraline (ZOLOFT) 100 MG tablet Take 200 mg by mouth daily. 10/02/22   [provider]  SYRINGE-NEEDLE, DISP, 3 ML (B-D 3CC LUER-LOK SYR 18GX1-1/2) 18G X 1-1/2 3 ML MISC USE TO DRAW UP TESTOSTERONE  Patient taking differently: 1 each by Other route See admin instructions. USE TO DRAW UP TESTOSTERONE  11/14/18   Trixie File, MD  Syringe/Needle, Disp, 18G X 1 1 ML MISC Use to draw up testosterone  Patient taking differently: 1 each by Other route See admin instructions. Use to draw up testosterone  03/04/17   Trixie File, MD  testosterone  cypionate (DEPOTESTOSTERONE CYPIONATE) 200 MG/ML injection INJECT 0.25MLS INTO THE MUSCLE ONCE WEEKLY Patient taking differently: Inject 0.6 mLs into the skin once a week. 03/21/18   Trixie File, MD  tiZANidine (ZANAFLEX) 4 MG tablet Take 4 mg by mouth every 8 (eight) hours as needed for muscle spasms. 10/31/22   [provider]  traZODone  (DESYREL ) 100 MG tablet Take 100 mg by mouth at bedtime. 11/23/22   [provider]    Allergies: Bupropion , Gemfibrozil, and Semaglutide     Review of Systems  Updated Vital Signs BP (!) 144/85   Pulse (!) 110   Temp 98.5 F (36.9 C)   Resp (!) 22   SpO2 100%   Physical Exam Vitals and nursing note reviewed.  Constitutional:      General: He is in acute distress.     Appearance: He is well-developed. He is ill-appearing and diaphoretic.  HENT:     Head: Normocephalic and atraumatic.     Mouth/Throat:     Mouth: Mucous membranes are moist.  Eyes:     General: Vision grossly intact. Gaze aligned appropriately.     Extraocular Movements: Extraocular movements intact.     Conjunctiva/sclera: Conjunctivae normal.  Cardiovascular:     Rate and Rhythm: Normal rate and regular rhythm.     Pulses: Normal pulses.     Heart sounds: Normal heart sounds, S1 normal and S2 normal. No murmur heard.    No friction rub. No gallop.  Pulmonary:     Effort: Pulmonary effort is normal. No  respiratory distress.     Breath sounds: Normal breath sounds.  Abdominal:     General: Abdomen is protuberant.     Palpations: Abdomen is soft.     Tenderness: There is generalized abdominal tenderness. There is no guarding or rebound.     Hernia: No hernia is present.  Musculoskeletal:        General: No swelling.     Cervical back: Full passive range of motion without pain, normal range of motion and neck supple. No pain with movement, spinous process tenderness or muscular tenderness. Normal range of motion.     Right lower leg: No edema.     Left lower leg: No edema.  Skin:    Capillary Refill: Capillary refill takes less than 2 seconds.     Findings:  No ecchymosis, erythema, lesion or wound.  Neurological:     Mental Status: He is alert and oriented to person, place, and time.     GCS: GCS eye subscore is 4. GCS verbal subscore is 5. GCS motor subscore is 6.     Cranial Nerves: Cranial nerves 2-12 are intact.     Sensory: Sensation is intact.     Motor: Motor function is intact. No weakness or abnormal muscle tone.     Coordination: Coordination is intact.  Psychiatric:        Mood and Affect: Mood normal.        Speech: Speech normal.        Behavior: Behavior normal.     (all labs ordered are listed, but only abnormal results are displayed) Labs Reviewed  CBC WITH DIFFERENTIAL/PLATELET - Abnormal; Notable for the following components:      Result Value   WBC 10.8 (*)    RBC 5.86 (*)    Hemoglobin 17.7 (*)    HCT 53.1 (*)    Neutro Abs 7.9 (*)    All other components within normal limits  COMPREHENSIVE METABOLIC PANEL WITH GFR - Abnormal; Notable for the following components:   Potassium 3.4 (*)    Chloride 95 (*)    CO2 21 (*)    Glucose, Bld 181 (*)    Creatinine, Ser 1.37 (*)    Total Bilirubin 1.6 (*)    GFR, Estimated 59 (*)    Anion gap 19 (*)    All other components within normal limits  RAPID URINE DRUG SCREEN, HOSP PERFORMED - Abnormal; Notable for the  following components:   Tetrahydrocannabinol POSITIVE (*)    All other components within normal limits  URINALYSIS, W/ REFLEX TO CULTURE (INFECTION SUSPECTED) - Abnormal; Notable for the following components:   Specific Gravity, Urine 1.035 (*)    Glucose, UA >=500 (*)    Ketones, ur 5 (*)    All other components within normal limits  CBG MONITORING, ED - Abnormal; Notable for the following components:   Glucose-Capillary 199 (*)    All other components within normal limits  I-STAT VENOUS BLOOD GAS, ED - Abnormal; Notable for the following components:   pH, Ven 7.462 (*)    pCO2, Ven 29.0 (*)    pO2, Ven 25 (*)    Potassium 3.4 (*)    Calcium , Ion 1.09 (*)    HCT 54.0 (*)    Hemoglobin 18.4 (*)    All other components within normal limits  CBG MONITORING, ED - Abnormal; Notable for the following components:   Glucose-Capillary 152 (*)    All other components within normal limits  I-STAT CG4 LACTIC ACID, ED - Abnormal; Notable for the following components:   Lactic Acid, Venous 7.0 (*)    All other components within normal limits  I-STAT VENOUS BLOOD GAS, ED - Abnormal; Notable for the following components:   pCO2, Ven 30.0 (*)    pO2, Ven 57 (*)    Bicarbonate 18.8 (*)    TCO2 20 (*)    Acid-base deficit 4.0 (*)    Calcium , Ion 1.10 (*)    HCT 54.0 (*)    Hemoglobin 18.4 (*)    All other components within normal limits  RESP PANEL BY RT-PCR (RSV, FLU A&B, COVID)  [id number]  CULTURE, BLOOD (ROUTINE X 2)  CULTURE, BLOOD (ROUTINE X 2)  ETHANOL  LIPASE, BLOOD  CK  I-STAT CG4 LACTIC ACID, ED  TROPONIN I (  HIGH SENSITIVITY)  TROPONIN I (HIGH SENSITIVITY)    EKG: EKG Interpretation Date/Time:  Wednesday June 10 2024 00:37:00 EST Ventricular Rate:  107 PR Interval:  162 QRS Duration:  96 QT Interval:  370 QTC Calculation: 494 R Axis:   -89  Text Interpretation: Sinus tachycardia LAD, consider left anterior fascicular block Borderline low voltage, extremity leads  Abnormal R-wave progression, late transition Borderline prolonged QT interval Baseline wander in lead(s) I II aVR aVF V3 V5 Confirmed by Haze Lonni PARAS [phone]) on 06/10/2024 12:41:50 AM  Radiology: CT CHEST ABDOMEN PELVIS W CONTRAST Result Date: 06/10/2024 EXAM: CT CHEST, ABDOMEN AND PELVIS WITH CONTRAST 06/10/2024 12:31:14 AM TECHNIQUE: CT of the chest, abdomen and pelvis was performed with the administration of 75 mL of iohexol (OMNIPAQUE) 350 MG/ML injection. Multiplanar reformatted images are provided for review. Automated exposure control, iterative reconstruction, and/or weight based adjustment of the mA/kV was utilized to reduce the radiation dose to as low as reasonably achievable. COMPARISON: CT abdomen/pelvis dated 07/09/2021. CLINICAL HISTORY: Sepsis. FINDINGS: LIMITATIONS/ARTIFACTS: Motion degraded images. CHEST: MEDIASTINUM AND LYMPH NODES: Mild coronary atherosclerosis of the LAD and right coronary artery. Heart and pericardium are otherwise unremarkable. The central airways are clear. No mediastinal, hilar or axillary lymphadenopathy. LUNGS AND PLEURA: No focal consolidation or pulmonary edema. No pleural effusion or pneumothorax. ABDOMEN AND PELVIS: LIVER: The liver is unremarkable. GALLBLADDER AND BILE DUCTS: Status post cholecystectomy. No biliary ductal dilatation. SPLEEN: No acute abnormality. PANCREAS: No acute abnormality. ADRENAL GLANDS: No acute abnormality. KIDNEYS, URETERS AND BLADDER: No stones in the kidneys or ureters. No hydronephrosis. No perinephric or periureteral stranding. Urinary bladder is unremarkable. GI AND BOWEL: Stomach demonstrates no acute abnormality. There is no bowel obstruction. Normal appendix (image 89). REPRODUCTIVE ORGANS: The prostate is unremarkable. Penile prosthesis with reservoir in the left lower quadrant. PERITONEUM AND RETROPERITONEUM: No ascites. No free air. VASCULATURE: Aorta is normal in caliber. Atherosclerotic calcifications of the  abdominal aorta and branch vessels, although patent. ABDOMINAL AND PELVIS LYMPH NODES: No lymphadenopathy. BONES AND SOFT TISSUES: No acute osseous abnormality. No focal soft tissue abnormality. IMPRESSION: 1. No acute abnormality of the chest, abdomen, and pelvis. Electronically signed by: Pinkie Pebbles MD 06/10/2024 12:37 AM EST RP Workstation: [id number]   DG Chest 2 View Result Date: 06/09/2024 EXAM: 2 VIEW(S) XRAY OF THE CHEST 06/09/2024 09:25:44 PM COMPARISON: 07/09/2021 CLINICAL HISTORY: shortness of breath FINDINGS: LUNGS AND PLEURA: No focal pulmonary opacity. No pleural effusion. No pneumothorax. HEART AND MEDIASTINUM: No acute abnormality of the cardiac and mediastinal silhouettes. BONES AND SOFT TISSUES: No acute osseous abnormality. IMPRESSION: 1. No acute cardiopulmonary process. Electronically signed by: Oneil Devonshire MD 06/09/2024 09:27 PM EST RP Workstation: [id number]     Procedures   Medications Ordered in the ED  lactated ringers  bolus 1,000 mL (has no administration in time range)  ondansetron  (ZOFRAN ) injection 4 mg (has no administration in time range)  lactated ringers  bolus 1,000 mL (0 mLs Intravenous Stopped 06/10/24 0323)  ondansetron  (ZOFRAN ) injection 4 mg (4 mg Intravenous Given 06/10/24 0011)  iohexol (OMNIPAQUE) 350 MG/ML injection 75 mL (75 mLs Intravenous Contrast Given 06/10/24 0032)  metoCLOPramide  (REGLAN ) injection 10 mg (10 mg Intravenous Given 06/10/24 0202)  HYDROmorphone  (DILAUDID ) injection 1 mg (1 mg Intravenous Given 06/10/24 0202)  methadone  (DOLOPHINE ) tablet 50 mg (50 mg Oral Given 06/10/24 0342)  HYDROmorphone  (DILAUDID ) injection 2 mg (2 mg Intravenous Given 06/10/24 0449)  Medical Decision Making Amount and/or Complexity of Data Reviewed Labs: ordered. Decision-making details documented in ED Course. Radiology: ordered and independent interpretation performed. Decision-making details documented in ED  Course. ECG/medicine tests: ordered and independent interpretation performed. Decision-making details documented in ED Course.  Risk Prescription drug management.   Differential Diagnosis considered includes, but not limited to: Cholelithiasis; cholecystitis; cholangitis; bowel obstruction; esophagitis; gastritis; peptic ulcer disease; pancreatitis; cardiac; colitis; appendicitis  Presents to the emergency department for evaluation of abdominal pain.  Patient has developed diffuse abdominal pain with some predominance on the right side, nausea and vomiting over the course of the day.  He reports he has had several intermittent episodes of this over the last few weeks.  Patient appears uncomfortable at arrival.  He is diaphoretic and appears a little bit agitated.  Extensive workup has been performed.  Initial blood work showed slight anion gap acidosis, otherwise unremarkable.  This was performed by provider in triage.  When patient arrived in the exam room, he appeared ill, uncomfortable, diaphoretic, slightly agitated.  Additional workup ordered including urinalysis, troponins, EKG, lactic acid and CT chest abdomen and pelvis.  Patient has been vomiting and is methadone  dependent.  Withdrawal was considered a possibility.  He was given IV Dilaudid  without improvement.  He was then given his normal dose of methadone  and continues to be agitated, complaining of diffuse abdominal cramping, continues to be diaphoretic.  Lactic acid results elevated at 7.  Will give additional IV fluids, additional Dilaudid .  CPK added on.  Will require hospitalization for further management.  CRITICAL CARE Performed by: Lonni JINNY Seats   Total critical care time: 35 minutes  Critical care time was exclusive of separately billable procedures and treating other patients.  Critical care was necessary to treat or prevent imminent or life-threatening deterioration.  Critical care was time spent personally  by me on the following activities: development of treatment plan with patient and/or surrogate as well as nursing, discussions with consultants, evaluation of patient's response to treatment, examination of patient, obtaining history from patient or surrogate, ordering and performing treatments and interventions, ordering and review of laboratory studies, ordering and review of radiographic studies, pulse oximetry and re-evaluation of patient's condition.      Final diagnoses:  Lactic acidosis  Generalized abdominal pain    ED Discharge Orders     None          Seats Lonni JINNY, MD 06/10/24 [phone]

## 2024-06-09 NOTE — ED Provider Notes (Incomplete)
 [place] EMERGENCY DEPARTMENT AT [facility] Provider Note   CSN: [id number] Arrival date & time: 06/09/24  8069     Patient presents with: Abdominal Pain and Nausea   Martin Downs is a 58 y.o. male.  {Add pertinent medical, surgical, social history, OB history to [id number]} Patient presents with general ill feeling.  Patient reports that he has been feeling very poor today with nausea, vomiting, sweats, shakes.  He has noticed urinary frequency for the last couple of weeks, no dysuria or frequency.  He reports that he has diffuse right sided abdominal pain.       Prior to Admission medications   Medication Sig Start Date End Date Taking? Authorizing Provider  BD DISP NEEDLES 18G X 1-1/2 MISC USE TO DRAW UP TESTOSTERONE  Patient taking differently: 1 each by Other route See admin instructions. USE TO DRAW UP TESTOSTERONE  08/26/19   Trixie File, MD  BD INSULIN  SYRINGE U/F 31G X 5/16 0.5 ML MISC USE 3 TIMES DAILY Patient taking differently: 1 each by Other route See admin instructions. USE 3 TIMES DAILY 09/19/17   Trixie File, MD  Blood Glucose Monitoring Suppl (ONE TOUCH ULTRA MINI) W/DEVICE KIT Use as directed to test blood sugar twice daily 250.00 Patient taking differently: 1 each by Other route See admin instructions. Use as directed to test blood sugar twice daily 250.00 04/29/14   Aron, Talia M, MD  busPIRone  (BUSPAR ) 15 MG tablet Take 15 mg by mouth 3 (three) times daily. 11/23/22   [provider]  Continuous Blood Gluc Sensor (FREESTYLE LIBRE 3 SENSOR) MISC 1 Device by Does not apply route continuous. Place 1 sensor on the skin every 14 days. Use to check glucose continuously 07/18/21   Clark, Katherine K, NP  diltiazem  (CARDIZEM  CD) 120 MG 24 hr capsule Take 1 capsule (120 mg total) by mouth daily. 07/12/21   Cheryle Page, MD  Docusate Sodium  (DSS) 100 MG CAPS Take 1 capsule by mouth daily at 6 (six) AM. Patient not taking: Reported on  01/30/2023 10/24/22   [provider]  empagliflozin  (JARDIANCE ) 25 MG TABS tablet Take 25 mg by mouth daily. 04/27/22   [provider]  fenofibrate  160 MG tablet Take 1 tablet (160 mg total) by mouth daily. 08/04/21   Trixie File, MD  glucose blood (ONETOUCH VERIO) test strip Use to test blood sugar 4 times daily-Dx code E11.65 07/14/21   Trixie File, MD  INSULIN  ASPART FLEXPEN Bland Inject into the skin in the morning, at noon, and at bedtime. INJECT 34 UNITS SUBCUTANEOUSLY BEFORE BREAKFAST AND INJECT 10 UNITS BEFORE LUNCH AND INJECT 10 UNITS BEFORE SUPPER (REPLACES HUMALOG ) 09/20/22   [provider]  insulin  glargine-yfgn (SEMGLEE ) 100 UNIT/ML Pen Inject into the skin 2 (two) times daily. 30 units at breakfast , 5-10 units at dinner 09/20/22   [provider]  Insulin  Pen Needle 32G X 4 MM MISC Use 4x a day 08/04/21   Trixie File, MD  Insulin  Syringe-Needle U-100 (B-D INSULIN  SYRINGE 1CC/25GX1) 25G X 1 1 ML MISC USE TO INJECT TESTOSTERONE  WEEKLY Patient taking differently: 1 each by Other route See admin instructions. USE TO INJECT TESTOSTERONE  WEEKLY 08/26/19   Trixie File, MD  metFORMIN  (GLUCOPHAGE -XR) 500 MG 24 hr tablet Take 1,000 mg by mouth 2 (two) times daily with a meal. 03/08/22   [provider]  methadone  (DOLOPHINE ) 10 MG/ML solution Take 90 mg by mouth daily.    [provider]  naproxen (NAPROSYN) 500  MG tablet Take 500 mg by mouth daily as needed. 10/31/22   [provider]  Nutritional Supplements (GLUCOSE MANAGEMENT) TABS Take 1 tablet by mouth as needed (low blood glucose). 09/08/22   [provider]  ondansetron  (ZOFRAN ) 4 MG tablet Take 1 tablet (4 mg total) by mouth as directed. Take as directed on prep instructions 01/09/23   Armbruster, Elspeth SQUIBB, MD  OneTouch Delica Lancets 33G MISC USE TO TEST BLOOD SUGAR 4 TIMES DAILY-DX code E11.65 07/14/21   Trixie File, MD  rosuvastatin  (CRESTOR ) 20 MG  tablet Take 0.5 tablets by mouth daily. 04/27/22   [provider]  sertraline (ZOLOFT) 100 MG tablet Take 200 mg by mouth daily. 10/02/22   [provider]  SYRINGE-NEEDLE, DISP, 3 ML (B-D 3CC LUER-LOK SYR 18GX1-1/2) 18G X 1-1/2 3 ML MISC USE TO DRAW UP TESTOSTERONE  Patient taking differently: 1 each by Other route See admin instructions. USE TO DRAW UP TESTOSTERONE  11/14/18   Trixie File, MD  Syringe/Needle, Disp, 18G X 1 1 ML MISC Use to draw up testosterone  Patient taking differently: 1 each by Other route See admin instructions. Use to draw up testosterone  03/04/17   Trixie File, MD  testosterone  cypionate (DEPOTESTOSTERONE CYPIONATE) 200 MG/ML injection INJECT 0.25MLS INTO THE MUSCLE ONCE WEEKLY Patient taking differently: Inject 0.6 mLs into the skin once a week. 03/21/18   Trixie File, MD  tiZANidine (ZANAFLEX) 4 MG tablet Take 4 mg by mouth every 8 (eight) hours as needed for muscle spasms. 10/31/22   [provider]  traZODone  (DESYREL ) 100 MG tablet Take 100 mg by mouth at bedtime. 11/23/22   [provider]    Allergies: Bupropion , Gemfibrozil, and Semaglutide     Review of Systems  Updated Vital Signs BP 106/80 (BP Location: Left Arm)   Pulse 91   Temp 98.5 F (36.9 C)   Resp (!) 25   SpO2 100%   Physical Exam Vitals and nursing note reviewed.  Constitutional:      General: He is in acute distress.     Appearance: He is well-developed. He is ill-appearing and diaphoretic.  HENT:     Head: Normocephalic and atraumatic.     Mouth/Throat:     Mouth: Mucous membranes are moist.  Eyes:     General: Vision grossly intact. Gaze aligned appropriately.     Extraocular Movements: Extraocular movements intact.     Conjunctiva/sclera: Conjunctivae normal.  Cardiovascular:     Rate and Rhythm: Normal rate and regular rhythm.     Pulses: Normal pulses.     Heart sounds: Normal heart sounds, S1 normal and S2 normal. No murmur heard.     No friction rub. No gallop.  Pulmonary:     Effort: Pulmonary effort is normal. No respiratory distress.     Breath sounds: Normal breath sounds.  Abdominal:     General: Abdomen is protuberant.     Palpations: Abdomen is soft.     Tenderness: There is generalized abdominal tenderness. There is no guarding or rebound.     Hernia: No hernia is present.  Musculoskeletal:        General: No swelling.     Cervical back: Full passive range of motion without pain, normal range of motion and neck supple. No pain with movement, spinous process tenderness or muscular tenderness. Normal range of motion.     Right lower leg: No edema.     Left lower leg: No edema.  Skin:    Capillary Refill: Capillary  refill takes less than 2 seconds.     Findings: No ecchymosis, erythema, lesion or wound.  Neurological:     Mental Status: He is alert and oriented to person, place, and time.     GCS: GCS eye subscore is 4. GCS verbal subscore is 5. GCS motor subscore is 6.     Cranial Nerves: Cranial nerves 2-12 are intact.     Sensory: Sensation is intact.     Motor: Motor function is intact. No weakness or abnormal muscle tone.     Coordination: Coordination is intact.  Psychiatric:        Mood and Affect: Mood normal.        Speech: Speech normal.        Behavior: Behavior normal.     (all labs ordered are listed, but only abnormal results are displayed) Labs Reviewed  CBC WITH DIFFERENTIAL/PLATELET - Abnormal; Notable for the following components:      Result Value   WBC 10.8 (*)    RBC 5.86 (*)    Hemoglobin 17.7 (*)    HCT 53.1 (*)    Neutro Abs 7.9 (*)    All other components within normal limits  COMPREHENSIVE METABOLIC PANEL WITH GFR - Abnormal; Notable for the following components:   Potassium 3.4 (*)    Chloride 95 (*)    CO2 21 (*)    Glucose, Bld 181 (*)    Creatinine, Ser 1.37 (*)    Total Bilirubin 1.6 (*)    GFR, Estimated 59 (*)    Anion gap 19 (*)    All other components  within normal limits  CBG MONITORING, ED - Abnormal; Notable for the following components:   Glucose-Capillary 199 (*)    All other components within normal limits  I-STAT VENOUS BLOOD GAS, ED - Abnormal; Notable for the following components:   pH, Ven 7.462 (*)    pCO2, Ven 29.0 (*)    pO2, Ven 25 (*)    Potassium 3.4 (*)    Calcium , Ion 1.09 (*)    HCT 54.0 (*)    Hemoglobin 18.4 (*)    All other components within normal limits  CBG MONITORING, ED - Abnormal; Notable for the following components:   Glucose-Capillary 152 (*)    All other components within normal limits  RESP PANEL BY RT-PCR (RSV, FLU A&B, COVID)  [id number]  CULTURE, BLOOD (ROUTINE X 2)  CULTURE, BLOOD (ROUTINE X 2)  ETHANOL  LIPASE, BLOOD  RAPID URINE DRUG SCREEN, HOSP PERFORMED  URINALYSIS, W/ REFLEX TO CULTURE (INFECTION SUSPECTED)  I-STAT CG4 LACTIC ACID, ED  TROPONIN I (HIGH SENSITIVITY)    EKG: None  Radiology: DG Chest 2 View Result Date: 06/09/2024 EXAM: 2 VIEW(S) XRAY OF THE CHEST 06/09/2024 09:25:44 PM COMPARISON: 07/09/2021 CLINICAL HISTORY: shortness of breath FINDINGS: LUNGS AND PLEURA: No focal pulmonary opacity. No pleural effusion. No pneumothorax. HEART AND MEDIASTINUM: No acute abnormality of the cardiac and mediastinal silhouettes. BONES AND SOFT TISSUES: No acute osseous abnormality. IMPRESSION: 1. No acute cardiopulmonary process. Electronically signed by: Oneil Devonshire MD 06/09/2024 09:27 PM EST RP Workstation: [id number]    {Document cardiac monitor, telemetry assessment procedure when appropriate:32947} Procedures   Medications Ordered in the ED  lactated ringers  bolus 1,000 mL (has no administration in time range)  ondansetron  (ZOFRAN ) injection 4 mg (has no administration in time range)      {Click here for ABCD2, HEART and other calculators REFRESH Note before signing:1}  Medical Decision Making Amount and/or Complexity of Data Reviewed Radiology:  ordered.  Risk Prescription drug management.   ***  {Document critical care time when appropriate  Document review of labs and clinical decision tools ie CHADS2VASC2, etc  Document your independent review of radiology images and any outside records  Document your discussion with family members, caretakers and with consultants  Document social determinants of health affecting pt's care  Document your decision making why or why not admission, treatments were needed:32947:::1}   Final diagnoses:  None    ED Discharge Orders     None

## 2024-06-09 NOTE — ED Triage Notes (Signed)
 Pt coming in from home reporting abdominal pain nausea generalized body aches and weakness. Pt has tachypnea at quick triage.

## 2024-06-09 NOTE — ED Provider Triage Note (Signed)
 Emergency Medicine Provider Triage Evaluation Note  Martin Downs , a 58 y.o. male  was evaluated in triage.  Pt complains of nausea, head pain, not feeling like self. Patient shaking triage, denies drug or alcohol use. Denies possibility of withdrawal. Patient taking home medications in triage as well. States he has been compliant with meds. Patient also states he is short of breath. Denies fever, chills, cough. Patient poor historian and difficult history to obtain.   Review of Systems  Positive: Nausea, head pain, not feeling like self, shaking, shortness of breath Negative: Fever, chills, chest pain  Physical Exam  BP (!) 147/71 (BP Location: Left Leg)   Pulse 99   Temp 98.3 F (36.8 C)   Resp (!) 24   SpO2 100%  Gen:   Awake, no distress   Resp:  Normal effort  MSK:   Moves extremities without difficulty  Other:  Abdomen soft non-tender, patient shaking in triage   Medical Decision Making  Medically screening exam initiated at 8:52 PM.  Appropriate orders placed.  Martin Downs was informed that the remainder of the evaluation will be completed by another provider, this initial triage assessment does not replace that evaluation, and the importance of remaining in the ED until their evaluation is complete.  Orders: CBC, CMP, CBG, lipase, UA, I stat VBG, Respiratory panel, ethanol, rapid UDS, CXR, EKG   Martin Terrall FALCON, PA-C 06/09/24 2057

## 2024-06-09 NOTE — ED Notes (Addendum)
 Patient refused IV start at triage , states rubber tourniquet burns.

## 2024-06-10 ENCOUNTER — Emergency Department (HOSPITAL_COMMUNITY)

## 2024-06-10 ENCOUNTER — Encounter (HOSPITAL_COMMUNITY): Payer: Self-pay

## 2024-06-10 DIAGNOSIS — K5900 Constipation, unspecified: Secondary | ICD-10-CM | POA: Diagnosis present

## 2024-06-10 DIAGNOSIS — G894 Chronic pain syndrome: Secondary | ICD-10-CM

## 2024-06-10 DIAGNOSIS — E785 Hyperlipidemia, unspecified: Secondary | ICD-10-CM | POA: Diagnosis present

## 2024-06-10 DIAGNOSIS — Z8249 Family history of ischemic heart disease and other diseases of the circulatory system: Secondary | ICD-10-CM | POA: Diagnosis not present

## 2024-06-10 DIAGNOSIS — E860 Dehydration: Secondary | ICD-10-CM | POA: Diagnosis present

## 2024-06-10 DIAGNOSIS — E872 Acidosis, unspecified: Principal | ICD-10-CM

## 2024-06-10 DIAGNOSIS — T383X5A Adverse effect of insulin and oral hypoglycemic [antidiabetic] drugs, initial encounter: Secondary | ICD-10-CM | POA: Diagnosis present

## 2024-06-10 DIAGNOSIS — F32A Depression, unspecified: Secondary | ICD-10-CM

## 2024-06-10 DIAGNOSIS — Z833 Family history of diabetes mellitus: Secondary | ICD-10-CM | POA: Diagnosis not present

## 2024-06-10 DIAGNOSIS — R112 Nausea with vomiting, unspecified: Secondary | ICD-10-CM | POA: Diagnosis present

## 2024-06-10 DIAGNOSIS — N179 Acute kidney failure, unspecified: Secondary | ICD-10-CM | POA: Diagnosis present

## 2024-06-10 DIAGNOSIS — D72829 Elevated white blood cell count, unspecified: Secondary | ICD-10-CM

## 2024-06-10 DIAGNOSIS — E119 Type 2 diabetes mellitus without complications: Secondary | ICD-10-CM | POA: Diagnosis present

## 2024-06-10 DIAGNOSIS — Z7984 Long term (current) use of oral hypoglycemic drugs: Secondary | ICD-10-CM | POA: Diagnosis not present

## 2024-06-10 DIAGNOSIS — D751 Secondary polycythemia: Secondary | ICD-10-CM

## 2024-06-10 DIAGNOSIS — Z79899 Other long term (current) drug therapy: Secondary | ICD-10-CM | POA: Diagnosis not present

## 2024-06-10 DIAGNOSIS — F419 Anxiety disorder, unspecified: Secondary | ICD-10-CM | POA: Diagnosis present

## 2024-06-10 DIAGNOSIS — G8929 Other chronic pain: Secondary | ICD-10-CM | POA: Diagnosis present

## 2024-06-10 DIAGNOSIS — Z794 Long term (current) use of insulin: Secondary | ICD-10-CM | POA: Diagnosis not present

## 2024-06-10 DIAGNOSIS — Z888 Allergy status to other drugs, medicaments and biological substances status: Secondary | ICD-10-CM | POA: Diagnosis not present

## 2024-06-10 DIAGNOSIS — Z1152 Encounter for screening for COVID-19: Secondary | ICD-10-CM | POA: Diagnosis not present

## 2024-06-10 DIAGNOSIS — R109 Unspecified abdominal pain: Secondary | ICD-10-CM

## 2024-06-10 DIAGNOSIS — Z79890 Hormone replacement therapy (postmenopausal): Secondary | ICD-10-CM | POA: Diagnosis not present

## 2024-06-10 DIAGNOSIS — Z9049 Acquired absence of other specified parts of digestive tract: Secondary | ICD-10-CM | POA: Diagnosis not present

## 2024-06-10 DIAGNOSIS — R1110 Vomiting, unspecified: Secondary | ICD-10-CM

## 2024-06-10 DIAGNOSIS — E7800 Pure hypercholesterolemia, unspecified: Secondary | ICD-10-CM

## 2024-06-10 DIAGNOSIS — R9431 Abnormal electrocardiogram [ECG] [EKG]: Secondary | ICD-10-CM

## 2024-06-10 DIAGNOSIS — Z87891 Personal history of nicotine dependence: Secondary | ICD-10-CM | POA: Diagnosis not present

## 2024-06-10 DIAGNOSIS — G4730 Sleep apnea, unspecified: Secondary | ICD-10-CM | POA: Diagnosis present

## 2024-06-10 LAB — LACTIC ACID, PLASMA
Lactic Acid, Venous: 3.2 mmol/L (ref 0.5–1.9)
Lactic Acid, Venous: 1.9 mmol/L (ref 0.5–1.9)

## 2024-06-10 LAB — URINALYSIS, W/ REFLEX TO CULTURE (INFECTION SUSPECTED)
Bacteria, UA: NONE SEEN
Bilirubin Urine: NEGATIVE
Glucose, UA: >=500 mg/dL — AB
Hgb urine dipstick: NEGATIVE
Ketones, ur: 5 mg/dL — AB
Leukocytes,Ua: NEGATIVE
Nitrite: NEGATIVE
Protein, ur: NEGATIVE mg/dL
Specific Gravity, Urine: 1.035 — ABNORMAL HIGH (ref 1.005–1.030)
pH: 7 (ref 5.0–8.0)

## 2024-06-10 LAB — I-STAT CG4 LACTIC ACID, ED
Lactic Acid, Venous: 7 mmol/L (ref 0.5–1.9)
Lactic Acid, Venous: 6.2 mmol/L (ref 0.5–1.9)

## 2024-06-10 LAB — RAPID URINE DRUG SCREEN, HOSP PERFORMED
Amphetamines: NOT DETECTED
Barbiturates: NOT DETECTED
Benzodiazepines: NOT DETECTED
Cocaine: NOT DETECTED
Opiates: NOT DETECTED
Tetrahydrocannabinol: POSITIVE — AB

## 2024-06-10 LAB — HEMOGLOBIN A1C
Hgb A1c MFr Bld: 6.3 % — ABNORMAL HIGH (ref 4.8–5.6)
Mean Plasma Glucose: 134.11 mg/dL

## 2024-06-10 LAB — CK: Total CK: 302 U/L (ref 49–397)

## 2024-06-10 LAB — I-STAT VENOUS BLOOD GAS, ED
Acid-base deficit: 4 mmol/L — ABNORMAL HIGH (ref 0.0–2.0)
Bicarbonate: 18.8 mmol/L — ABNORMAL LOW (ref 20.0–28.0)
Calcium, Ion: 1.1 mmol/L — ABNORMAL LOW (ref 1.15–1.40)
HCT: 54 % — ABNORMAL HIGH (ref 39.0–52.0)
Hemoglobin: 18.4 g/dL — ABNORMAL HIGH (ref 13.0–17.0)
O2 Saturation: 90 %
Potassium: 3.8 mmol/L (ref 3.5–5.1)
Sodium: 136 mmol/L (ref 135–145)
TCO2: 20 mmol/L — ABNORMAL LOW (ref 22–32)
pCO2, Ven: 30 mmHg — ABNORMAL LOW (ref 44–60)
pH, Ven: 7.404 (ref 7.25–7.43)
pO2, Ven: 57 mmHg — ABNORMAL HIGH (ref 32–45)

## 2024-06-10 LAB — HIV ANTIBODY (ROUTINE TESTING W REFLEX): HIV Screen 4th Generation wRfx: NONREACTIVE

## 2024-06-10 LAB — TROPONIN I (HIGH SENSITIVITY)
Troponin I (High Sensitivity): 3 ng/L (ref <18)
Troponin I (High Sensitivity): 4 ng/L (ref <18)

## 2024-06-10 LAB — CBG MONITORING, ED: Glucose-Capillary: 107 mg/dL — ABNORMAL HIGH (ref 70–99)

## 2024-06-10 LAB — GLUCOSE, CAPILLARY
Glucose-Capillary: 136 mg/dL — ABNORMAL HIGH (ref 70–99)
Glucose-Capillary: 87 mg/dL (ref 70–99)
Glucose-Capillary: 111 mg/dL — ABNORMAL HIGH (ref 70–99)

## 2024-06-10 MED ORDER — ENOXAPARIN SODIUM 40 MG/0.4ML IJ SOSY
40.0000 mg | PREFILLED_SYRINGE | INTRAMUSCULAR | Status: DC
  Administered 2024-06-10: 40 mg via SUBCUTANEOUS
  Filled 2024-06-10 (×2): qty 0.4

## 2024-06-10 MED ORDER — LORAZEPAM 2 MG/ML IJ SOLN
0.5000 mg | Freq: Four times a day (QID) | INTRAMUSCULAR | Status: DC | PRN
  Administered 2024-06-10: 0.5 mg via INTRAVENOUS
  Filled 2024-06-10: qty 1

## 2024-06-10 MED ORDER — SERTRALINE HCL 100 MG PO TABS
200.0000 mg | ORAL_TABLET | Freq: Every day | ORAL | Status: DC
  Administered 2024-06-10: 200 mg via ORAL
  Filled 2024-06-10: qty 2

## 2024-06-10 MED ORDER — LACTATED RINGERS IV BOLUS
1000.0000 mL | Freq: Once | INTRAVENOUS | Status: AC
  Administered 2024-06-10: 1000 mL via INTRAVENOUS

## 2024-06-10 MED ORDER — HYDROMORPHONE HCL 1 MG/ML IJ SOLN
2.0000 mg | Freq: Once | INTRAMUSCULAR | Status: AC
  Administered 2024-06-10: 2 mg via INTRAVENOUS
  Filled 2024-06-10: qty 2

## 2024-06-10 MED ORDER — INSULIN ASPART 100 UNIT/ML IJ SOLN: 0.0000 [IU] | Freq: Every day | INTRAMUSCULAR | Status: DC

## 2024-06-10 MED ORDER — FENOFIBRATE 160 MG PO TABS
160.0000 mg | ORAL_TABLET | Freq: Every day | ORAL | Status: DC
  Administered 2024-06-10 – 2024-06-11 (×2): 160 mg via ORAL
  Filled 2024-06-10 (×3): qty 1

## 2024-06-10 MED ORDER — HALOPERIDOL LACTATE 5 MG/ML IJ SOLN
2.0000 mg | Freq: Once | INTRAMUSCULAR | Status: AC
  Administered 2024-06-10: 2 mg via INTRAVENOUS
  Filled 2024-06-10: qty 1

## 2024-06-10 MED ORDER — METHADONE HCL 10 MG PO TABS
100.0000 mg | ORAL_TABLET | ORAL | Status: DC
  Filled 2024-06-10: qty 10

## 2024-06-10 MED ORDER — METHADONE HCL 10 MG/ML PO CONC: 100.0000 mg | Freq: Every day | ORAL | Status: DC

## 2024-06-10 MED ORDER — BUSPIRONE HCL 10 MG PO TABS: 15.0000 mg | ORAL_TABLET | ORAL | Status: DC

## 2024-06-10 MED ORDER — ALBUTEROL SULFATE (2.5 MG/3ML) 0.083% IN NEBU: 2.5000 mg | INHALATION_SOLUTION | Freq: Four times a day (QID) | RESPIRATORY_TRACT | Status: DC | PRN

## 2024-06-10 MED ORDER — ACETAMINOPHEN 325 MG PO TABS: 650.0000 mg | ORAL_TABLET | Freq: Four times a day (QID) | ORAL | Status: DC | PRN

## 2024-06-10 MED ORDER — IOHEXOL 350 MG/ML SOLN
75.0000 mL | Freq: Once | INTRAVENOUS | Status: AC | PRN
  Administered 2024-06-10: 75 mL via INTRAVENOUS

## 2024-06-10 MED ORDER — ROSUVASTATIN CALCIUM 20 MG PO TABS
200.0000 mg | ORAL_TABLET | Freq: Every day | ORAL | Status: DC
  Filled 2024-06-10: qty 10

## 2024-06-10 MED ORDER — ROSUVASTATIN CALCIUM 5 MG PO TABS
10.0000 mg | ORAL_TABLET | Freq: Every day | ORAL | Status: DC
  Administered 2024-06-11: 10 mg via ORAL
  Filled 2024-06-10: qty 2

## 2024-06-10 MED ORDER — ACETAMINOPHEN 650 MG RE SUPP: 650.0000 mg | Freq: Four times a day (QID) | RECTAL | Status: DC | PRN

## 2024-06-10 MED ORDER — PROCHLORPERAZINE EDISYLATE 10 MG/2ML IJ SOLN: 5.0000 mg | INTRAMUSCULAR | Status: DC | PRN

## 2024-06-10 MED ORDER — METHADONE HCL 10 MG PO TABS
50.0000 mg | ORAL_TABLET | Freq: Once | ORAL | Status: AC
  Administered 2024-06-10: 50 mg via ORAL
  Filled 2024-06-10: qty 5

## 2024-06-10 MED ORDER — METHADONE HCL 10 MG PO TABS
100.0000 mg | ORAL_TABLET | Freq: Every day | ORAL | Status: DC
Start: 2024-06-10 — End: 2024-06-11
  Administered 2024-06-10 – 2024-06-11 (×2): 100 mg via ORAL
  Filled 2024-06-10 (×2): qty 10

## 2024-06-10 MED ORDER — ONDANSETRON HCL 4 MG/2ML IJ SOLN
4.0000 mg | Freq: Once | INTRAMUSCULAR | Status: AC
  Administered 2024-06-10: 4 mg via INTRAVENOUS
  Filled 2024-06-10: qty 2

## 2024-06-10 MED ORDER — INSULIN ASPART 100 UNIT/ML IJ SOLN: 0.0000 [IU] | Freq: Three times a day (TID) | INTRAMUSCULAR | Status: DC

## 2024-06-10 MED ORDER — ROSUVASTATIN CALCIUM 20 MG PO TABS: 20.0000 mg | ORAL_TABLET | Freq: Every day | ORAL | Status: DC

## 2024-06-10 MED ORDER — TRIMETHOBENZAMIDE HCL 100 MG/ML IM SOLN
200.0000 mg | Freq: Four times a day (QID) | INTRAMUSCULAR | Status: DC | PRN
Start: 2024-06-10 — End: 2024-06-11

## 2024-06-10 MED ORDER — LORAZEPAM 2 MG/ML IJ SOLN
1.0000 mg | Freq: Once | INTRAMUSCULAR | Status: AC
  Administered 2024-06-10: 1 mg via INTRAVENOUS
  Filled 2024-06-10: qty 1

## 2024-06-10 MED ORDER — TRAZODONE HCL 50 MG PO TABS
150.0000 mg | ORAL_TABLET | Freq: Every day | ORAL | Status: DC
  Administered 2024-06-10: 150 mg via ORAL
  Filled 2024-06-10: qty 3

## 2024-06-10 MED ORDER — SODIUM CHLORIDE 0.9 % IV SOLN: INTRAVENOUS | Status: AC

## 2024-06-10 MED ORDER — INSULIN ASPART 100 UNIT/ML IJ SOLN
0.0000 [IU] | Freq: Three times a day (TID) | INTRAMUSCULAR | Status: DC
  Administered 2024-06-11: 2 [IU] via SUBCUTANEOUS
  Filled 2024-06-10: qty 3

## 2024-06-10 MED ORDER — METOCLOPRAMIDE HCL 5 MG/ML IJ SOLN
10.0000 mg | Freq: Once | INTRAMUSCULAR | Status: AC
  Administered 2024-06-10: 10 mg via INTRAVENOUS
  Filled 2024-06-10: qty 2

## 2024-06-10 MED ORDER — EMPAGLIFLOZIN 25 MG PO TABS
25.0000 mg | ORAL_TABLET | Freq: Every day | ORAL | Status: DC
  Administered 2024-06-10: 25 mg via ORAL
  Filled 2024-06-10 (×3): qty 1

## 2024-06-10 MED ORDER — HYDROMORPHONE HCL 1 MG/ML IJ SOLN
1.0000 mg | Freq: Once | INTRAMUSCULAR | Status: AC
  Administered 2024-06-10: 1 mg via INTRAVENOUS
  Filled 2024-06-10: qty 1

## 2024-06-10 NOTE — Progress Notes (Signed)
   06/10/24 1534  Provider Notification  Provider Name/Title Claudene Reeves A,MD  Date Provider Notified 06/10/24  Time Provider Notified 1534  Method of Notification Page  Notification Reason Critical Result  Test performed and critical result Lactic acid-3.2  Date Critical Result Received 06/10/24  Time Critical Result Received 1532  Provider response Other (Comment) (Lactic acid level is trending down so no any intervention needed)  Date of Provider Response 06/10/24  Time of Provider Response 1534

## 2024-06-10 NOTE — ED Notes (Signed)
 Secondary RN informed RN that pt is pacing in room and asking to go to car. Pt was redirected.  Pt said he is unsure why he is feeling this bad but denies any comfort measures at this time. Pt is sweating on his forehead. PRN Ativan  will be administered per MD  Pt is sitting in bed holding phone and calm at this time.

## 2024-06-10 NOTE — ED Notes (Signed)
 Attempted to call MD due to pt restlessness and getting out of bed. Pt confused to situation.  Clotilda RN notified.

## 2024-06-10 NOTE — Plan of Care (Signed)
   Problem: Clinical Measurements: Goal: Will remain free from infection Outcome: Progressing Goal: Diagnostic test results will improve Outcome: Progressing

## 2024-06-10 NOTE — H&P (Signed)
 History and Physical    Patient: Martin Downs FMW:[id number] DOB: 1966-07-13 DOA: 06/09/2024 DOS: the patient was seen and examined on 06/10/2024 PCP: [facility]  Patient coming from: Home  Chief Complaint:  Chief Complaint  Patient presents with   Abdominal Pain   Nausea   HPI: Martin Downs is a 58 y.o. male with medical history significant of diabetes mellitus type 2, chronic pain, anxiety, depression presents with nausea and vomiting.  The patient experienced nausea and vomiting starting last Wednesday after attending the [facility]. He vomited twice and continued to feel unwell the following day, with symptoms resolving by Friday or Saturday night. He has not had similar episodes in the past.  He has a history of diabetes managed with insulin  and oral medications. He reports increased nocturnal urination due to increased fluid intake. He also experiences constipation, which he attributes to medication use, and is taking Miralax  and a medication similar to Docusate. His last bowel movement was yesterday around lunch.  He has a history of high blood pressure and a 'blown out disc' in his back. He is currently taking methadone  for pain management and a medication for cholesterol. No recent falls or injuries have been reported.  He consumes alcohol. No recent history of similar episodes of nausea and vomiting.  In the emergency department patient was noted to be tachycardic and tachypneic with all other vital signs maintained.  Labs noted WBC 10.8, hemoglobin 17.7, potassium 3.4, chloride 95, BUN 15, creatinine 1.37, glucose 181, anion gap 19, and lactic acid 7->6.2.  CT scan of the chest abdomen pelvis with contrast did not reveal any acute abnormality.  Patient had been given Dilaudid  IV, Ativan  1 mg IV, Compazine ,  Review of Systems: As mentioned in the history of present illness. All other systems reviewed and are negative. Past Medical History:  Diagnosis  Date   Acute gangrenous cholecystitis s/p cholecystectomy 05/30/2016 05/31/2016   Anxiety    on meds   Borderline diabetes    Cholecystitis 05/30/2016   Chronic pain    Common bile duct repair with T-Tube 11/192017 05/31/2016   Depression    on meds   Diabetes mellitus without complication (HCC)    on meds   Hyperlipidemia    on meds   Narcotic dependence (HCC)    Sleep apnea    no cpap   Past Surgical History:  Procedure Laterality Date   BIOPSY  07/10/2021   Procedure: BIOPSY;  Surgeon: Leigh Elspeth SQUIBB, MD;  Location: [facility];  Service: Gastroenterology;;   CHOLECYSTECTOMY N/A 05/30/2016   Procedure: LAPAROSCOPIC CHOLECYSTECTOMY WITH INTRAOPERATIVE CHOLANGIOGRAM;  Surgeon: Donnice Lima, MD;  Location: [facility] OR;  Service: General;  Laterality: N/A;   CHOLECYSTECTOMY N/A 05/30/2016   Procedure: OPEN COMMON BILE DUCT EXPLORATION; INSERTION OF T-TUBE;  Surgeon: Donnice Lima, MD;  Location: [facility] OR;  Service: General;  Laterality: N/A;   COLONOSCOPY  2017   DJ-MAC-aborted due to poor prep   ESOPHAGOGASTRODUODENOSCOPY (EGD) WITH PROPOFOL  N/A 07/10/2021   Procedure: ESOPHAGOGASTRODUODENOSCOPY (EGD) WITH PROPOFOL ;  Surgeon: Leigh Elspeth SQUIBB, MD;  Location: [facility];  Service: Gastroenterology;  Laterality: N/A;   removal of bone spur Bilateral 1995   elbps   TOOTH EXTRACTION  2017   UMBILICAL HERNIA REPAIR N/A 05/30/2016   Procedure: HERNIA REPAIR UMBILICAL ADULT;  Surgeon: Donnice Lima, MD;  Location: [facility] OR;  Service: General;  Laterality: N/A;   Social History:  reports that he has never smoked. He quit smokeless tobacco use  about 4 years ago.  His smokeless tobacco use included snuff. He reports that he does not currently use drugs. He reports that he does not drink alcohol.  Allergies  Allergen Reactions   Bupropion      Other Reaction(s): Tremor   Gemfibrozil Diarrhea   Semaglutide  Nausea And Vomiting    Family History  Problem Relation Age of Onset    Hypertension Mother    Cancer Father        not sure of type   Diabetes Father    Hypertension Father    Heart attack Father 68   Diabetes Brother    Hypertension Brother    Heart attack Brother        around 25 years of age   Kidney cancer Brother        stage 4-1 kidney removed   Colon cancer Neg Hx     Prior to Admission medications   Medication Sig Start Date End Date Taking? Authorizing Provider  busPIRone  (BUSPAR ) 15 MG tablet Take 15-30 mg by mouth See admin instructions. Take 1 tablet by mouth in the morning and 2 tablets in the evening 11/23/22  Yes [provider]  Cholecalciferol (VITAMIN D-3) 125 MCG (5000 UT) TABS Take 5,000 Units by mouth daily.   Yes [provider]  cyanocobalamin (VITAMIN B12) 500 MCG tablet Take 500 mcg by mouth daily. 02/21/24  Yes [provider]  Docusate Sodium  (DSS) 100 MG CAPS Take 100 mg by mouth daily as needed (for bowel movement). 04/16/24  Yes [provider]  empagliflozin  (JARDIANCE ) 25 MG TABS tablet Take 12.5 mg by mouth daily. 04/27/22  Yes [provider]  fenofibrate  160 MG tablet Take 1 tablet (160 mg total) by mouth daily. 08/04/21  Yes Trixie File, MD  glucose 4 GM chewable tablet Chew 1 tablet by mouth once as needed for low blood sugar. 01/01/24  Yes [provider]  Magnesium  400 MG TABS Take 400 mg by mouth daily.   Yes [provider]  metFORMIN  (GLUCOPHAGE -XR) 500 MG 24 hr tablet Take 1,000 mg by mouth daily. 03/08/22  Yes [provider]  methadone  (DOLOPHINE ) 10 MG/ML solution Take 100 mg by mouth daily.   Yes [provider]  rosuvastatin  (CRESTOR ) 20 MG tablet Take 10 tablets by mouth daily. 04/27/22  Yes [provider]  sertraline (ZOLOFT) 100 MG tablet Take 200 mg by mouth at bedtime. 10/02/22  Yes [provider]  testosterone  cypionate (DEPOTESTOSTERONE CYPIONATE) 200 MG/ML injection INJECT 0.25MLS INTO THE MUSCLE ONCE  WEEKLY Patient taking differently: Inject 0.6 mLs into the skin once a week. 03/21/18  Yes Trixie File, MD  tirzepatide (ZEPBOUND) 15 MG/0.5ML Pen Inject 15 mg into the skin every Monday. 04/14/24  Yes [provider]  traZODone  (DESYREL ) 150 MG tablet Take 150 mg by mouth at bedtime. 11/01/23  Yes [provider]  BD DISP NEEDLES 18G X 1-1/2 MISC USE TO DRAW UP TESTOSTERONE  Patient taking differently: 1 each by Other route See admin instructions. USE TO DRAW UP TESTOSTERONE  08/26/19   Trixie File, MD  BD INSULIN  SYRINGE U/F 31G X 5/16 0.5 ML MISC USE 3 TIMES DAILY Patient taking differently: 1 each by Other route See admin instructions. USE 3 TIMES DAILY 09/19/17   Trixie File, MD  Blood Glucose Monitoring Suppl (ONE TOUCH ULTRA MINI) W/DEVICE KIT Use as directed to test blood sugar twice daily 250.00 Patient taking differently: 1 each by Other route See admin instructions. Use as  directed to test blood sugar twice daily 250.00 04/29/14   Aron, Talia M, MD  Insulin  Syringe-Needle U-100 (B-D INSULIN  SYRINGE 1CC/25GX1) 25G X 1 1 ML MISC USE TO INJECT TESTOSTERONE  WEEKLY Patient taking differently: 1 each by Other route See admin instructions. USE TO INJECT TESTOSTERONE  WEEKLY 08/26/19   Trixie File, MD  naloxone  (NARCAN ) nasal spray 4 mg/0.1 mL Place 1 spray into the nose as needed (for opioid overdose). Patient not taking: Reported on 06/10/2024 02/06/24   [provider]  SYRINGE-NEEDLE, DISP, 3 ML (B-D 3CC LUER-LOK SYR 18GX1-1/2) 18G X 1-1/2 3 ML MISC USE TO DRAW UP TESTOSTERONE  Patient taking differently: 1 each by Other route See admin instructions. USE TO DRAW UP TESTOSTERONE  11/14/18   Trixie File, MD  Syringe/Needle, Disp, 18G X 1 1 ML MISC Use to draw up testosterone  Patient taking differently: 1 each by Other route See admin instructions. Use to draw up testosterone  03/04/17   Trixie File, MD    Physical Exam: Vitals:   06/10/24  0518 06/10/24 0552 06/10/24 0621 06/10/24 0715  BP:      Pulse: (!) 104 100 (!) 109 (!) 105  Resp:      Temp:      SpO2: 100% 100% 98% 100%    Constitutional: Middle-age male currently who appears to be in some distress Eyes: PERRL, lids and conjunctivae normal ENMT: Mucous membranes are moist. Posterior pharynx clear of any exudate or lesions.Normal dentition.  Neck: normal, supple  Respiratory: clear to auscultation bilaterally, no wheezing, no crackles. Normal respiratory effort. No accessory muscle use.  Cardiovascular: Regular rate and rhythm, no murmurs / rubs / gallops. No extremity edema. 2+ pedal pulses. No carotid bruits.  Abdomen: no tenderness, no masses palpated. No hepatosplenomegaly. Bowel sounds positive.  Musculoskeletal: no clubbing / cyanosis.Good ROM, no contractures. Normal muscle tone.  Skin: no rashes, lesions, ulcers. No induration Neurologic: CN 2-12 grossly intact.   Strength 5/5 in all 4.  Psychiatric:   Alert and oriented x 3.  Anxious mood.   Data Reviewed:  EKG reveals sinus tachycardia 107 bpm with QTc 494.  Reviewed labs, imaging, and pertinent records as documented.  Assessment and Plan:  Nausea and vomiting Acute.  Patient presents with complaints of nausea and vomiting over the last 2 to 3 days.  CT scan of the abdomen and pelvis did not note any  acute abnormality.    UDS positive for marijuana.   Question cannabinol hyperemesis syndrome versus withdrawals opiates versus gastroenteritis versus possibility of gastroparesis. - Admit to a telemetry bed - Aspiration precautions with elevation head of bed - Clear liquid diet and advance as tolerated - Tigan as needed for vomiting - Normal saline IV fluids at 125 mL/h  Lactic acidosis Metabolic acidosis with elevated anion gap Leukocytosis Acute.  Patient presents with lactic acid elevated to 7.  Thought related to patient being significantly dehydrated in the setting of nausea and vomiting.  Patient  had been given 2 L of IV fluids.  No clear source for infection noted this time. - Check blood cultures  - Continue to trend lactic acid level  - Continue IV fluids  Acute kidney injury Creatinine noted to be elevated at 1.37 with BUN 15.  Baseline creatinine previously noted to be around 0.9.  Suspect secondary to dehydration.  Urinalysis did not show any signs for infection, but did note elevated specific gravity suggestive of dehydration. - Avoid nephrotoxic agents - Continue IV fluids  - Recheck kidney function  in a.m.  Polycythemia Acute.  Hemoglobin noted 18.4.  Thought likely secondary to hemoconcentration. - Recheck CBC tomorrow morning  Prolonged QT interval Acute.  QTc prolonged at 494. - Correct electrolyte abnormalities  Controlled diabetes mellitus type 2, without long-term use of insulin  Last available hemoglobin A1c noted to be 9 when checked a year ago. - Hypoglycemic protocols - Hold metformin  - CBGs before every meal with sensitive SSI - Adjust regimen as needed  Anxiety and depression - Continue Zoloft and buspirone  once able  Chronic pain - Continue methadone   Hyperlipidemia - Continue Crestor  and fenofibrate   Advance Care Planning:   Code Status: Full Code  Consults: None  Family Communication: None records  Severity of Illness: The appropriate patient status for this patient is INPATIENT. Inpatient status is judged to be reasonable and necessary in order to provide the required intensity of service to ensure the patient's safety. The patient's presenting symptoms, physical exam findings, and initial radiographic and laboratory data in the context of their chronic comorbidities is felt to place them at high risk for further clinical deterioration. Furthermore, it is not anticipated that the patient will be medically stable for discharge from the hospital within 2 midnights of admission.   * I certify that at the point of admission it is my clinical  judgment that the patient will require inpatient hospital care spanning beyond 2 midnights from the point of admission due to high intensity of service, high risk for further deterioration and high frequency of surveillance required.*  Author: Maximino DELENA Sharps, MD 06/10/2024 8:14 AM  For on call review [email or web address].

## 2024-06-11 LAB — BASIC METABOLIC PANEL WITH GFR
Anion gap: 7 (ref 5–15)
BUN: 14 mg/dL (ref 6–20)
CO2: 28 mmol/L (ref 22–32)
Calcium: 8.6 mg/dL — ABNORMAL LOW (ref 8.9–10.3)
Chloride: 102 mmol/L (ref 98–111)
Creatinine, Ser: 1.21 mg/dL (ref 0.61–1.24)
GFR, Estimated: >60 mL/min (ref >60)
Glucose, Bld: 92 mg/dL (ref 70–99)
Potassium: 4.5 mmol/L (ref 3.5–5.1)
Sodium: 137 mmol/L (ref 135–145)

## 2024-06-11 LAB — CBC
HCT: 45.9 % (ref 39.0–52.0)
Hemoglobin: 15.4 g/dL (ref 13.0–17.0)
MCH: 30.5 pg (ref 26.0–34.0)
MCHC: 33.6 g/dL (ref 30.0–36.0)
MCV: 90.9 fL (ref 80.0–100.0)
Platelets: 174 K/uL (ref 150–400)
RBC: 5.05 MIL/uL (ref 4.22–5.81)
RDW: 12.7 % (ref 11.5–15.5)
WBC: 9.2 K/uL (ref 4.0–10.5)
nRBC: 0 % (ref 0.0–0.2)

## 2024-06-11 LAB — MAGNESIUM: Magnesium: 2.3 mg/dL (ref 1.7–2.4)

## 2024-06-11 LAB — GLUCOSE, CAPILLARY: Glucose-Capillary: 161 mg/dL — ABNORMAL HIGH (ref 70–99)

## 2024-06-11 MED ORDER — EMPAGLIFLOZIN 25 MG PO TABS: 25.0000 mg | ORAL_TABLET | Freq: Every day | ORAL | Status: AC

## 2024-06-11 MED ORDER — METFORMIN HCL ER 500 MG PO TB24: 1000.0000 mg | ORAL_TABLET | Freq: Every day | ORAL | Status: AC

## 2024-06-11 NOTE — Plan of Care (Signed)

## 2024-06-11 NOTE — Discharge Instructions (Signed)
 Follow with Primary MD [facility] in 7 days   Get CBC, CMP, checked  by Primary MD next visit.    Activity: As tolerated with Full fall precautions use walker/cane & assistance as needed   Disposition Home    Diet: Heart Healthy / Carb modified   On your next visit with your primary care physician please Get Medicines reviewed and adjusted.   Please request your Prim.MD to go over all Hospital Tests and Procedure/Radiological results at the follow up, please get all Hospital records sent to your Prim MD by signing hospital release before you go home.   If you experience worsening of your admission symptoms, develop shortness of breath, life threatening emergency, suicidal or homicidal thoughts you must seek medical attention immediately by calling 911 or calling your MD immediately  if symptoms less severe.  You Must read complete instructions/literature along with all the possible adverse reactions/side effects for all the Medicines you take and that have been prescribed to you. Take any new Medicines after you have completely understood and accpet all the possible adverse reactions/side effects.   Do not drive, operating heavy machinery, perform activities at heights, swimming or participation in water activities or provide baby sitting services if your were admitted for syncope or siezures until you have seen by Primary MD or a Neurologist and advised to do so again.  Do not drive when taking Pain medications.    Do not take more than prescribed Pain, Sleep and Anxiety Medications  Special Instructions: If you have smoked or chewed Tobacco  in the last 2 yrs please stop smoking, stop any regular Alcohol  and or any Recreational drug use.  Wear Seat belts while driving.   Please note  You were cared for by a hospitalist during your hospital stay. If you have any questions about your discharge medications or the care you received while you were in the hospital  after you are discharged, you can call the unit and asked to speak with the hospitalist on call if the hospitalist that took care of you is not available. Once you are discharged, your primary care physician will handle any further medical issues. Please note that NO REFILLS for any discharge medications will be authorized once you are discharged, as it is imperative that you return to your primary care physician (or establish a relationship with a primary care physician if you do not have one) for your aftercare needs so that they can reassess your need for medications and monitor your lab values.

## 2024-06-11 NOTE — Discharge Summary (Signed)
 Physician Discharge Summary  [name]:[id number] DOB: Dec 16, 1965 DOA: 06/09/2024  [facility]  Admit date: 06/09/2024 Discharge date: 06/11/2024  Admitted From: (Home) Disposition:  (Home)  Recommendations for Outpatient Follow-up:  Follow up with PCP in 1-2 weeks Please obtain BMP/CBC in one week   Diet recommendation: Heart Healthy / Carb Modified   Brief/Interim Summary:  Martin Downs is a 58 y.o. male with medical history significant of diabetes mellitus type 2, chronic pain, anxiety, depression presents with nausea and vomiting.   The patient experienced nausea and vomiting starting last Wednesday after attending the [facility]. He vomited twice and continued to feel unwell the following day, with symptoms resolving by Friday or Saturday night. He has not had similar episodes in the past.   He has a history of diabetes managed with insulin  and oral medications. He reports increased nocturnal urination due to increased fluid intake. He also experiences constipation, which he attributes to medication use, and is taking Miralax  and a medication similar to Docusate. His last bowel movement was yesterday around lunch.   He has a history of high blood pressure and a 'blown out disc' in his back. He is currently taking methadone  for pain management and a medication for cholesterol. No recent falls or injuries have been reported.   He consumes alcohol. No recent history of similar episodes of nausea and vomiting.   In the emergency department patient was noted to be tachycardic and tachypneic with all other vital signs maintained.  Labs noted WBC 10.8, hemoglobin 17.7, potassium 3.4, chloride 95, BUN 15, creatinine 1.37, glucose 181, anion gap 19, and lactic acid 7->6.2.  CT scan of the chest abdomen pelvis with contrast did not reveal any acute abnormality.  Patient had been given Dilaudid  IV, Ativan  1 mg IV, Compazine ,   Nausea and vomiting -Likely  due to Mounjaro and THC Gummies use - Acute, since with nausea and vomiting 2 to 3 days prior to admission, CT abdomen pelvis with no acute findings. - This most likely in the setting of Mounjaro use, as it was recently started he reports 4 to 6 weeks ago, and it does appear to be significantly uptitrated at this point to 15 mg, as well can be related to [facility] use as he reported he used a gummy for the first time last week as well. - No further nausea and vomiting, finished his breakfast. - Will hold Mounjaro on discharge especially with good A1c of 6.3. - He was counseled about avoidance of further Gummies use   Lactic acidosis not related to infection, but due to dehydration nausea and vomiting Metabolic acidosis with elevated anion gap Leukocytosis Acute.  Patient presents with lactic acid elevated to 7.  Thought related to patient being significantly dehydrated in the setting of nausea and vomiting.   - Resolved with IV fluids.   Acute kidney injury Creatinine noted to be elevated at 1.37 with BUN 15.  Baseline creatinine previously noted to be around 0.9.  Suspect secondary to dehydration.  Urinalysis did not show any signs for infection, but did note elevated specific gravity suggestive of dehydration. Hold with IV fluids   Polycythemia Acute.  Hemoglobin noted 18.4.  Thought likely secondary to hemoconcentration. - R resolved with hydration   Prolonged QT interval Acute.  QTc prolonged at 494. - Correctted electrolyte abnormalities   Controlled diabetes mellitus type 2, without long-term use of insulin  Controlled with A1c of 6.2  Discussed with patient to resume home insulin   on discharge  -discontinue Mounjaro for now till he is seen by his endocrinologist -Hold Jardiance  for next 3 days,  -hold metformin  for next 2 days.   Anxiety and depression - Continue Zoloft and buspirone     Chronic pain - Continue methadone    Hyperlipidemia - Continue Crestor  and  fenofibrate   Discharge Diagnoses:  Principal Problem:   Abdominal pain with vomiting Active Problems:   Lactic acidosis   Leukocytosis   Metabolic acidosis with increased anion gap and accumulation of organic acids   AKI (acute kidney injury)   Polycythemia   Prolonged QT interval   Type 2 diabetes mellitus without complications (HCC)   Anxiety and depression   Chronic pain   HLD (hyperlipidemia)    Discharge Instructions  Discharge Instructions     Diet - low sodium heart healthy   Complete by: As directed    Discharge instructions   Complete by: As directed    Follow with Primary MD [facility] in 7 days   Get CBC, CMP, checked  by Primary MD next visit.    Activity: As tolerated with Full fall precautions use walker/cane & assistance as needed   Disposition Home    Diet: Heart Healthy / Carb modified   On your next visit with your primary care physician please Get Medicines reviewed and adjusted.   Please request your Prim.MD to go over all Hospital Tests and Procedure/Radiological results at the follow up, please get all Hospital records sent to your Prim MD by signing hospital release before you go home.   If you experience worsening of your admission symptoms, develop shortness of breath, life threatening emergency, suicidal or homicidal thoughts you must seek medical attention immediately by calling 911 or calling your MD immediately  if symptoms less severe.  You Must read complete instructions/literature along with all the possible adverse reactions/side effects for all the Medicines you take and that have been prescribed to you. Take any new Medicines after you have completely understood and accpet all the possible adverse reactions/side effects.   Do not drive, operating heavy machinery, perform activities at heights, swimming or participation in water activities or provide baby sitting services if your were admitted for syncope or siezures  until you have seen by Primary MD or a Neurologist and advised to do so again.  Do not drive when taking Pain medications.    Do not take more than prescribed Pain, Sleep and Anxiety Medications  Special Instructions: If you have smoked or chewed Tobacco  in the last 2 yrs please stop smoking, stop any regular Alcohol  and or any Recreational drug use.  Wear Seat belts while driving.   Please note  You were cared for by a hospitalist during your hospital stay. If you have any questions about your discharge medications or the care you received while you were in the hospital after you are discharged, you can call the unit and asked to speak with the hospitalist on call if the hospitalist that took care of you is not available. Once you are discharged, your primary care physician will handle any further medical issues. Please note that NO REFILLS for any discharge medications will be authorized once you are discharged, as it is imperative that you return to your primary care physician (or establish a relationship with a primary care physician if you do not have one) for your aftercare needs so that they can reassess your need for medications and monitor your lab values.   Increase activity  slowly   Complete by: As directed       Allergies as of 06/11/2024       Reactions   Bupropion     Other Reaction(s): Tremor   Gemfibrozil Diarrhea   Semaglutide  Nausea And Vomiting        Medication List     STOP taking these medications    tirzepatide 15 MG/0.5ML Pen Commonly known as: ZEPBOUND       TAKE these medications    BD Disp Needles 18G X 1-1/2 Misc Generic drug: NEEDLE (DISP) 18 G USE TO DRAW UP TESTOSTERONE  What changed: See the new instructions.   BD Insulin  Syringe U/F 31G X 5/16 0.5 ML Misc Generic drug: Insulin  Syringe-Needle U-100 USE 3 TIMES DAILY What changed:  how much to take how to take this when to take this additional instructions   B-D INSULIN   SYRINGE 1CC/25GX1 25G X 1 1 ML Misc Generic drug: Insulin  Syringe-Needle U-100 USE TO INJECT TESTOSTERONE  WEEKLY What changed:  how much to take how to take this when to take this   busPIRone  15 MG tablet Commonly known as: BUSPAR  Take 15-30 mg by mouth See admin instructions. Take 1 tablet by mouth in the morning and 2 tablets in the evening   cyanocobalamin 500 MCG tablet Commonly known as: VITAMIN B12 Take 500 mcg by mouth daily.   DSS 100 MG Caps Take 100 mg by mouth daily as needed (for bowel movement).   empagliflozin  25 MG Tabs tablet Commonly known as: JARDIANCE  Take 1 tablet (25 mg total) by mouth daily. Start taking on: June 14, 2024 What changed:  how much to take These instructions start on June 14, 2024. If you are unsure what to do until then, ask your doctor or other care provider.   fenofibrate  160 MG tablet Take 1 tablet (160 mg total) by mouth daily.   glucose 4 GM chewable tablet Chew 1 tablet by mouth once as needed for low blood sugar.   Magnesium  400 MG Tabs Take 400 mg by mouth daily.   metFORMIN  500 MG 24 hr tablet Commonly known as: GLUCOPHAGE -XR Take 2 tablets (1,000 mg total) by mouth daily. Start taking on: June 13, 2024 What changed: These instructions start on June 13, 2024. If you are unsure what to do until then, ask your doctor or other care provider.   methadone  10 MG/ML solution Commonly known as: DOLOPHINE  Take 100 mg by mouth daily.   naloxone  4 MG/0.1ML Liqd nasal spray kit Commonly known as: NARCAN  Place 1 spray into the nose as needed (for opioid overdose).   ONE TOUCH ULTRA MINI w/Device Kit Use as directed to test blood sugar twice daily 250.00 What changed:  how much to take how to take this when to take this   rosuvastatin  20 MG tablet Commonly known as: CRESTOR  Take 10 mg by mouth daily.   sertraline 100 MG tablet Commonly known as: ZOLOFT Take 200 mg by mouth at bedtime.   SYRINGE-NEEDLE  (DISP) 3 ML 18G X 1-1/2 3 ML Misc Commonly known as: B-D 3CC LUER-LOK SYR 18GX1-1/2 USE TO DRAW UP TESTOSTERONE  What changed:  how much to take how to take this when to take this   Syringe/Needle (Disp) 18G X 1 1 ML Misc Use to draw up testosterone  What changed:  how much to take how to take this when to take this   testosterone  cypionate 200 MG/ML injection Commonly known as: DEPOTESTOSTERONE CYPIONATE INJECT 0.25MLS INTO THE MUSCLE ONCE WEEKLY What  changed: See the new instructions.   traZODone  150 MG tablet Commonly known as: DESYREL  Take 150 mg by mouth at bedtime.   Vitamin D-3 125 MCG (5000 UT) Tabs Take 5,000 Units by mouth daily.        Allergies  Allergen Reactions   Bupropion      Other Reaction(s): Tremor   Gemfibrozil Diarrhea   Semaglutide  Nausea And Vomiting    Consultations: None   Procedures/Studies: CT CHEST ABDOMEN PELVIS W CONTRAST Result Date: 06/10/2024 EXAM: CT CHEST, ABDOMEN AND PELVIS WITH CONTRAST 06/10/2024 12:31:14 AM TECHNIQUE: CT of the chest, abdomen and pelvis was performed with the administration of 75 mL of iohexol (OMNIPAQUE) 350 MG/ML injection. Multiplanar reformatted images are provided for review. Automated exposure control, iterative reconstruction, and/or weight based adjustment of the mA/kV was utilized to reduce the radiation dose to as low as reasonably achievable. COMPARISON: CT abdomen/pelvis dated 07/09/2021. CLINICAL HISTORY: Sepsis. FINDINGS: LIMITATIONS/ARTIFACTS: Motion degraded images. CHEST: MEDIASTINUM AND LYMPH NODES: Mild coronary atherosclerosis of the LAD and right coronary artery. Heart and pericardium are otherwise unremarkable. The central airways are clear. No mediastinal, hilar or axillary lymphadenopathy. LUNGS AND PLEURA: No focal consolidation or pulmonary edema. No pleural effusion or pneumothorax. ABDOMEN AND PELVIS: LIVER: The liver is unremarkable. GALLBLADDER AND BILE DUCTS: Status post  cholecystectomy. No biliary ductal dilatation. SPLEEN: No acute abnormality. PANCREAS: No acute abnormality. ADRENAL GLANDS: No acute abnormality. KIDNEYS, URETERS AND BLADDER: No stones in the kidneys or ureters. No hydronephrosis. No perinephric or periureteral stranding. Urinary bladder is unremarkable. GI AND BOWEL: Stomach demonstrates no acute abnormality. There is no bowel obstruction. Normal appendix (image 89). REPRODUCTIVE ORGANS: The prostate is unremarkable. Penile prosthesis with reservoir in the left lower quadrant. PERITONEUM AND RETROPERITONEUM: No ascites. No free air. VASCULATURE: Aorta is normal in caliber. Atherosclerotic calcifications of the abdominal aorta and branch vessels, although patent. ABDOMINAL AND PELVIS LYMPH NODES: No lymphadenopathy. BONES AND SOFT TISSUES: No acute osseous abnormality. No focal soft tissue abnormality. IMPRESSION: 1. No acute abnormality of the chest, abdomen, and pelvis. Electronically signed by: Pinkie Pebbles MD 06/10/2024 12:37 AM EST RP Workstation: [id number]   DG Chest 2 View Result Date: 06/09/2024 EXAM: 2 VIEW(S) XRAY OF THE CHEST 06/09/2024 09:25:44 PM COMPARISON: 07/09/2021 CLINICAL HISTORY: shortness of breath FINDINGS: LUNGS AND PLEURA: No focal pulmonary opacity. No pleural effusion. No pneumothorax. HEART AND MEDIASTINUM: No acute abnormality of the cardiac and mediastinal silhouettes. BONES AND SOFT TISSUES: No acute osseous abnormality. IMPRESSION: 1. No acute cardiopulmonary process. Electronically signed by: Oneil Devonshire MD 06/09/2024 09:27 PM EST RP Workstation: [id number]      Subjective: He tolerated  his breakfast this morning with no nausea, vomiting, he denies any complaints, asking to go home today.  Discharge Exam: Vitals:   06/11/24 0514 06/11/24 0747  BP: 128/67 125/73  Pulse:    Resp:    Temp:  98.1 F (36.7 C)  SpO2: 95%    Vitals:   06/11/24 0000 06/11/24 0510 06/11/24 0514 06/11/24 0747  BP: 121/68  128/67  125/73  Pulse: 77 81    Resp: 15 17    Temp:    98.1 F (36.7 C)  TempSrc:    Oral  SpO2: 93% 93% 95%   Weight:      Height:        General: Pt is alert, awake, not in acute distress Cardiovascular: RRR, S1/S2 +, no rubs, no gallops Respiratory: CTA bilaterally, no wheezing, no rhonchi Abdominal: Soft, NT, ND, bowel sounds +  Extremities: no edema, no cyanosis    The results of significant diagnostics from this hospitalization (including imaging, microbiology, ancillary and laboratory) are listed below for reference.     Microbiology: Recent Results (from the past 240 hours)  Resp panel by RT-PCR (RSV, Flu A&B, Covid) Anterior Nasal Swab     Status: None   Collection Time: 06/09/24  8:52 PM   Specimen: Anterior Nasal Swab  Result Value Ref Range Status   SARS Coronavirus 2 by RT PCR NEGATIVE NEGATIVE Final   Influenza A by PCR NEGATIVE NEGATIVE Final   Influenza B by PCR NEGATIVE NEGATIVE Final    Comment: (NOTE) The Xpert Xpress SARS-CoV-2/FLU/RSV plus assay is intended as an aid in the diagnosis of influenza from Nasopharyngeal swab specimens and should not be used as a sole basis for treatment. Nasal washings and aspirates are unacceptable for Xpert Xpress SARS-CoV-2/FLU/RSV testing.  Fact Sheet for Patients: bloggercourse.com  Fact Sheet for Healthcare Providers: seriousbroker.it  This test is not yet approved or cleared by the [place]  FDA and has been authorized for detection and/or diagnosis of SARS-CoV-2 by FDA under an Emergency Use Authorization (EUA). This EUA will remain in effect (meaning this test can be used) for the duration of the COVID-19 declaration under Section 564(b)(1) of the Act, 21 U.S.C. section 360bbb-3(b)(1), unless the authorization is terminated or revoked.     Resp Syncytial Virus by PCR NEGATIVE NEGATIVE Final    Comment: (NOTE) Fact Sheet for  Patients: bloggercourse.com  Fact Sheet for Healthcare Providers: seriousbroker.it  This test is not yet approved or cleared by the [place]  FDA and has been authorized for detection and/or diagnosis of SARS-CoV-2 by FDA under an Emergency Use Authorization (EUA). This EUA will remain in effect (meaning this test can be used) for the duration of the COVID-19 declaration under Section 564(b)(1) of the Act, 21 U.S.C. section 360bbb-3(b)(1), unless the authorization is terminated or revoked.  Performed at [facility], [address] [place], [place] [address]   Culture, blood (Routine X 2) w Reflex to ID Panel     Status: None (Preliminary result)   Collection Time: 06/10/24  1:12 AM   Specimen: BLOOD LEFT HAND  Result Value Ref Range Status   Specimen Description BLOOD LEFT HAND  Final   Special Requests   Final    BOTTLES DRAWN AEROBIC AND ANAEROBIC Blood Culture adequate volume   Culture   Final    NO GROWTH 1 DAY Performed at [facility], [address], [place], [place] [address]    Report Status PENDING  Incomplete  Culture, blood (Routine X 2) w Reflex to ID Panel     Status: None (Preliminary result)   Collection Time: 06/10/24  6:25 PM   Specimen: BLOOD RIGHT HAND  Result Value Ref Range Status   Specimen Description BLOOD RIGHT HAND  Final   Special Requests   Final    BOTTLES DRAWN AEROBIC AND ANAEROBIC Blood Culture results may not be optimal due to an inadequate volume of blood received in culture bottles   Culture   Final    NO GROWTH < 12 HOURS Performed at [facility], [address] [place], [place] [address]    Report Status PENDING  Incomplete     Labs: BNP (last 3 results) No results for input(s): BNP in the last 8760 hours. Basic Metabolic Panel: Recent Labs  Lab 06/09/24 2052 06/09/24 2113 06/10/24 0405 06/11/24 0320  NA  135 135 136 137  K 3.4* 3.4* 3.8 4.5  CL 95*   --   --  102  CO2 21*  --   --  28  GLUCOSE 181*  --   --  92  BUN 15  --   --  14  CREATININE 1.37*  --   --  1.21  CALCIUM  9.8  --   --  8.6*  MG  --   --   --  2.3   Liver Function Tests: Recent Labs  Lab 06/09/24 2052  AST 34  ALT 43  ALKPHOS 42  BILITOT 1.6*  PROT 7.4  ALBUMIN 4.5   Recent Labs  Lab 06/09/24 2052  LIPASE 25   No results for input(s): AMMONIA in the last 168 hours. CBC: Recent Labs  Lab 06/09/24 2052 06/09/24 2113 06/10/24 0405 06/11/24 0320  WBC 10.8*  --   --  9.2  NEUTROABS 7.9*  --   --   --   HGB 17.7* 18.4* 18.4* 15.4  HCT 53.1* 54.0* 54.0* 45.9  MCV 90.6  --   --  90.9  PLT 259  --   --  174   Cardiac Enzymes: Recent Labs  Lab 06/10/24 0112  CKTOTAL 302   BNP: Invalid input(s): POCBNP CBG: Recent Labs  Lab 06/10/24 1154 06/10/24 1524 06/10/24 1716 06/10/24 2118 06/11/24 0748  GLUCAP 107* 136* 87 111* 161*   D-Dimer No results for input(s): DDIMER in the last 72 hours. Hgb A1c Recent Labs    06/10/24 0939  HGBA1C 6.3*   Lipid Profile No results for input(s): CHOL, HDL, LDLCALC, TRIG, CHOLHDL, LDLDIRECT in the last 72 hours. Thyroid  function studies No results for input(s): TSH, T4TOTAL, T3FREE, THYROIDAB in the last 72 hours.  Invalid input(s): FREET3 Anemia work up No results for input(s): VITAMINB12, FOLATE, FERRITIN, TIBC, IRON, RETICCTPCT in the last 72 hours. Urinalysis    Component Value Date/Time   COLORURINE YELLOW 06/10/2024 0053   APPEARANCEUR CLEAR 06/10/2024 0053   LABSPEC 1.035 (H) 06/10/2024 0053   PHURINE 7.0 06/10/2024 0053   GLUCOSEU >=500 (A) 06/10/2024 0053   HGBUR NEGATIVE 06/10/2024 0053   BILIRUBINUR NEGATIVE 06/10/2024 0053   KETONESUR 5 (A) 06/10/2024 0053   PROTEINUR NEGATIVE 06/10/2024 0053   NITRITE NEGATIVE 06/10/2024 0053   LEUKOCYTESUR NEGATIVE 06/10/2024 0053   Sepsis Labs Recent Labs  Lab 06/09/24 2052 06/11/24 0320  WBC 10.8*  9.2   Microbiology Recent Results (from the past 240 hours)  Resp panel by RT-PCR (RSV, Flu A&B, Covid) Anterior Nasal Swab     Status: None   Collection Time: 06/09/24  8:52 PM   Specimen: Anterior Nasal Swab  Result Value Ref Range Status   SARS Coronavirus 2 by RT PCR NEGATIVE NEGATIVE Final   Influenza A by PCR NEGATIVE NEGATIVE Final   Influenza B by PCR NEGATIVE NEGATIVE Final    Comment: (NOTE) The Xpert Xpress SARS-CoV-2/FLU/RSV plus assay is intended as an aid in the diagnosis of influenza from Nasopharyngeal swab specimens and should not be used as a sole basis for treatment. Nasal washings and aspirates are unacceptable for Xpert Xpress SARS-CoV-2/FLU/RSV testing.  Fact Sheet for Patients: bloggercourse.com  Fact Sheet for Healthcare Providers: seriousbroker.it  This test is not yet approved or cleared by the [place]  FDA and has been authorized for detection and/or diagnosis of SARS-CoV-2 by FDA under an Emergency Use Authorization (EUA). This EUA will remain in effect (meaning this test can be used) for  the duration of the COVID-19 declaration under Section 564(b)(1) of the Act, 21 U.S.C. section 360bbb-3(b)(1), unless the authorization is terminated or revoked.     Resp Syncytial Virus by PCR NEGATIVE NEGATIVE Final    Comment: (NOTE) Fact Sheet for Patients: bloggercourse.com  Fact Sheet for Healthcare Providers: seriousbroker.it  This test is not yet approved or cleared by the [place]  FDA and has been authorized for detection and/or diagnosis of SARS-CoV-2 by FDA under an Emergency Use Authorization (EUA). This EUA will remain in effect (meaning this test can be used) for the duration of the COVID-19 declaration under Section 564(b)(1) of the Act, 21 U.S.C. section 360bbb-3(b)(1), unless the authorization is terminated or revoked.  Performed at  [facility], [address], [place], [place] [address]   Culture, blood (Routine X 2) w Reflex to ID Panel     Status: None (Preliminary result)   Collection Time: 06/10/24  1:12 AM   Specimen: BLOOD LEFT HAND  Result Value Ref Range Status   Specimen Description BLOOD LEFT HAND  Final   Special Requests   Final    BOTTLES DRAWN AEROBIC AND ANAEROBIC Blood Culture adequate volume   Culture   Final    NO GROWTH 1 DAY Performed at [facility], [address], [place], [place] [address]    Report Status PENDING  Incomplete  Culture, blood (Routine X 2) w Reflex to ID Panel     Status: None (Preliminary result)   Collection Time: 06/10/24  6:25 PM   Specimen: BLOOD RIGHT HAND  Result Value Ref Range Status   Specimen Description BLOOD RIGHT HAND  Final   Special Requests   Final    BOTTLES DRAWN AEROBIC AND ANAEROBIC Blood Culture results may not be optimal due to an inadequate volume of blood received in culture bottles   Culture   Final    NO GROWTH < 12 HOURS Performed at [facility], [address] [place], [place] [address]    Report Status PENDING  Incomplete     Time coordinating discharge: Over 30 minutes  SIGNED:   Brayton Lye, MD  [facility] 06/11/2024, 9:32 AM Pager   If 7PM-7AM, please contact night-coverage [email or web address] Password [email or web address]

## 2024-06-15 LAB — CULTURE, BLOOD (ROUTINE X 2)
Culture: NO GROWTH
Culture: NO GROWTH
Special Requests: ADEQUATE
